# Patient Record
Sex: Male | Born: 1965
Health system: Southern US, Community
[De-identification: ages and names within clinical notes are randomized; demographics above are authoritative.]

## PROBLEM LIST (undated history)

## (undated) DIAGNOSIS — E785 Hyperlipidemia, unspecified: Secondary | ICD-10-CM

## (undated) DIAGNOSIS — I10 Essential (primary) hypertension: Secondary | ICD-10-CM

## (undated) DIAGNOSIS — M1990 Unspecified osteoarthritis, unspecified site: Secondary | ICD-10-CM

## (undated) DIAGNOSIS — N189 Chronic kidney disease, unspecified: Secondary | ICD-10-CM

## (undated) DIAGNOSIS — R51 Headache: Secondary | ICD-10-CM

## (undated) DIAGNOSIS — K649 Unspecified hemorrhoids: Secondary | ICD-10-CM

## (undated) DIAGNOSIS — K760 Fatty (change of) liver, not elsewhere classified: Secondary | ICD-10-CM

## (undated) DIAGNOSIS — K219 Gastro-esophageal reflux disease without esophagitis: Secondary | ICD-10-CM

## (undated) DIAGNOSIS — F410 Panic disorder [episodic paroxysmal anxiety] without agoraphobia: Secondary | ICD-10-CM

## (undated) DIAGNOSIS — G4730 Sleep apnea, unspecified: Secondary | ICD-10-CM

## (undated) DIAGNOSIS — T4145XA Adverse effect of unspecified anesthetic, initial encounter: Secondary | ICD-10-CM

## (undated) DIAGNOSIS — K4090 Unilateral inguinal hernia, without obstruction or gangrene, not specified as recurrent: Secondary | ICD-10-CM

## (undated) DIAGNOSIS — R519 Headache, unspecified: Secondary | ICD-10-CM

## (undated) DIAGNOSIS — T8859XA Other complications of anesthesia, initial encounter: Secondary | ICD-10-CM

## (undated) DIAGNOSIS — R945 Abnormal results of liver function studies: Secondary | ICD-10-CM

## (undated) DIAGNOSIS — F419 Anxiety disorder, unspecified: Secondary | ICD-10-CM

## (undated) DIAGNOSIS — F319 Bipolar disorder, unspecified: Secondary | ICD-10-CM

## (undated) DIAGNOSIS — R7989 Other specified abnormal findings of blood chemistry: Secondary | ICD-10-CM

## (undated) HISTORY — PX: HERNIA REPAIR: SHX51

## (undated) HISTORY — PX: VASECTOMY: SHX75

## (undated) HISTORY — PX: FRACTURE SURGERY: SHX138

## (undated) HISTORY — PX: LIPOMA EXCISION: SHX5283

## (undated) HISTORY — PX: COLONOSCOPY: SHX174

---

## 2004-03-01 ENCOUNTER — Ambulatory Visit: Payer: Self-pay

## 2009-04-15 ENCOUNTER — Ambulatory Visit: Payer: Self-pay | Admitting: Family Medicine

## 2013-01-05 ENCOUNTER — Inpatient Hospital Stay: Payer: Self-pay | Admitting: Internal Medicine

## 2013-01-05 LAB — BASIC METABOLIC PANEL
Anion Gap: 7 (ref 7–16)
BUN: 46 mg/dL — ABNORMAL HIGH (ref 7–18)
Chloride: 98 mmol/L (ref 98–107)
Co2: 29 mmol/L (ref 21–32)
Creatinine: 3.13 mg/dL — ABNORMAL HIGH (ref 0.60–1.30)
EGFR (African American): 26 — ABNORMAL LOW
Glucose: 115 mg/dL — ABNORMAL HIGH (ref 65–99)
Osmolality: 281 (ref 275–301)
Sodium: 134 mmol/L — ABNORMAL LOW (ref 136–145)

## 2013-01-05 LAB — CBC
HCT: 45.2 % (ref 40.0–52.0)
HGB: 16 g/dL (ref 13.0–18.0)
MCH: 31.7 pg (ref 26.0–34.0)
MCHC: 35.3 g/dL (ref 32.0–36.0)
MCV: 90 fL (ref 80–100)
Platelet: 351 10*3/uL (ref 150–440)
RDW: 13.8 % (ref 11.5–14.5)

## 2013-01-05 LAB — URINALYSIS, COMPLETE
Bilirubin,UR: NEGATIVE
Blood: NEGATIVE
Glucose,UR: 50 mg/dL (ref 0–75)
Granular Cast: 6
Leukocyte Esterase: NEGATIVE
Ph: 5 (ref 4.5–8.0)
Specific Gravity: 1.02 (ref 1.003–1.030)
Squamous Epithelial: <1
WBC UR: 4 /HPF (ref 0–5)

## 2013-01-06 LAB — CBC WITH DIFFERENTIAL/PLATELET
Basophil #: 0.1 10*3/uL (ref 0.0–0.1)
Eosinophil #: 0.3 10*3/uL (ref 0.0–0.7)
Eosinophil %: 4.4 %
HGB: 13.9 g/dL (ref 13.0–18.0)
Lymphocyte %: 33.4 %
MCH: 32.1 pg (ref 26.0–34.0)
MCV: 91 fL (ref 80–100)
Monocyte %: 11.6 %
Neutrophil #: 3.1 10*3/uL (ref 1.4–6.5)
Neutrophil %: 49.7 %
Platelet: 254 10*3/uL (ref 150–440)
RBC: 4.32 10*6/uL — ABNORMAL LOW (ref 4.40–5.90)
WBC: 6.3 10*3/uL (ref 3.8–10.6)

## 2013-01-06 LAB — BASIC METABOLIC PANEL
Anion Gap: 6 — ABNORMAL LOW (ref 7–16)
BUN: 31 mg/dL — ABNORMAL HIGH (ref 7–18)
Calcium, Total: 8.6 mg/dL (ref 8.5–10.1)
Chloride: 106 mmol/L (ref 98–107)
Co2: 27 mmol/L (ref 21–32)
EGFR (African American): 55 — ABNORMAL LOW
EGFR (Non-African Amer.): 47 — ABNORMAL LOW
Glucose: 109 mg/dL — ABNORMAL HIGH (ref 65–99)
Potassium: 3.5 mmol/L (ref 3.5–5.1)

## 2013-01-06 LAB — MAGNESIUM: Magnesium: 2.4 mg/dL

## 2013-01-06 LAB — LIPID PANEL
Cholesterol: 149 mg/dL (ref 0–200)
HDL Cholesterol: 27 mg/dL — ABNORMAL LOW (ref 40–60)
Triglycerides: 452 mg/dL — ABNORMAL HIGH (ref 0–200)

## 2013-01-06 LAB — HEMOGLOBIN A1C: Hemoglobin A1C: 6 % (ref 4.2–6.3)

## 2013-01-06 LAB — TSH: Thyroid Stimulating Horm: 0.89 u[IU]/mL

## 2013-01-07 LAB — BASIC METABOLIC PANEL
Anion Gap: 4 — ABNORMAL LOW (ref 7–16)
Calcium, Total: 8.5 mg/dL (ref 8.5–10.1)
Chloride: 107 mmol/L (ref 98–107)
Creatinine: 1.18 mg/dL (ref 0.60–1.30)
EGFR (African American): >60
Glucose: 92 mg/dL (ref 65–99)
Osmolality: 281 (ref 275–301)
Sodium: 140 mmol/L (ref 136–145)

## 2013-01-07 LAB — CK: CK, Total: 237 U/L — ABNORMAL HIGH (ref 35–232)

## 2013-10-11 ENCOUNTER — Ambulatory Visit: Payer: Self-pay | Admitting: Gastroenterology

## 2013-10-14 LAB — PATHOLOGY REPORT

## 2014-08-22 NOTE — Discharge Summary (Signed)
PATIENT NAME:  Nathan Chambers, Nathan Chambers MR#:  [id number] DATE OF BIRTH:  Feb 01, 1966  DATE OF ADMISSION:  01/05/2013 DATE OF DISCHARGE:  01/07/2013  PRIMARY CARE PHYSICIAN: Juluis Pitch, MD  FINAL DIAGNOSES: 1.  Acute renal failure, dehydration and relative hypotension on admission.  2.  Hypertension.  3.  Hyperlipidemia and hypertriglyceridemia.  4.  Obesity.  5.  Body aches.  Advised to stop Lotrel, stop meloxicam and hold Lipitor for body aches for 1 week.   DISCHARGE MEDICATIONS: 1.  Gemfibrozil 600 mg twice a day. 2.  Vitamin C 500 mg daily. 3.  Aspirin 81 mg daily. 4.  Folic acid 0.8 mg daily. 5.  Vitamin B12 500 mcg daily. 6.  Multivitamin daily. 7.  Ocuvite antioxidant multivitamin 1 tablet daily. 8.  Xanax 0.5 mg twice a day as needed for anxiety. 9.  Paxil 20 mg 2 tablets daily. 10.  Lutein 6 mg daily. 11.  Tramadol 50 mg every 6 hours as needed for pain. 12.  Amlodipine 10 mg daily.   DIET: Low sodium diet, regular consistency.   ACTIVITY: As tolerated.   DISCHARGE FOLLOWUP:  In 1 to 2 weeks with Dr. Lovie Macadamia.   REASON FOR ADMISSION: The patient was admitted 01/05/2013 and discharged 01/07/2013. Came in with dehydration and dizziness and was found to be in acute renal failure with dehydration with blood pressure being on the lower side. The patient was given vigorous IV fluid hydration. His Lotrel was held.   LABORATORY AND DIAGNOSTICS: During the hospital course included: Glucose 115, BUN 46, creatinine 3.13, sodium 134, potassium 3.6, chloride 98, CO2 29. White blood cell count 8.1, H and H 16.0 and 45.2, platelet count 351. Urinalysis positive for protein 100 mg/dL, glucose 50 mg/dL. Ultrasound of the kidneys showed mild increased renal echo texture. Can be consistent with renal failure. TSH 0.89, total cholesterol 149, triglycerides 452, HDL 27. LDL could not be calculated. Hemoglobin A1c was 6.0. Magnesium 2.4. Creatinine on the next day came down to 1.67. Creatinine upon  discharge 1.18 with a GFR greater than 60. CPK 237.   HOSPITAL COURSE PER PROBLEM LIST:  1.  For the patient's acute renal failure, dehydration and relative hypotension on admission, the patient was given vigorous IV fluid hydration. Creatinine improved down to normal range. The patient was discharged home in stable condition. I believe this was secondary to the patient's nausea and retching that was going on prior to admission. I did hold the patient's benazepril, part of the Lotrel, and did write him a script for Norvasc to go home with. I did advise to stop the meloxicam and decrease the tramadol dose.  2.  History of hypertension. Blood pressure was on the lower side during the hospitalization. Blood pressure started creeping up upon discharge. Can restart Norvasc, part of the Lotrel, upon discharge. Blood pressure 153/91 upon discharge.  3.  Hyperlipidemia and hypertriglyceridemia. The patient is having body aches. I advised him to stop the Lipitor for a week and see what happens. Can continue the gemfibrozil for right now. CPK normal range so this is not a rhabdomyolysis.  4.  Obesity, BMI 36.9. Weight loss recommended.  5.  Body aches. Could be secondary to the combination of statin and gemfibrozil. Continue to monitor as outpatient. Follow-up with Dr. Lovie Macadamia in 1 to 2 weeks.  TIME SPENT ON DISCHARGE: 35 minutes.  ____________________________ Tana Conch. Leslye Peer, MD rjw:sb D: 01/07/2013 15:40:06 ET T: 01/07/2013 16:12:58 ET JOB#: [id number]  cc: Tana Conch. Leslye Peer, MD, <  Dictator> Youlanda Roys. Lovie Macadamia, MD Marisue Brooklyn MD ELECTRONICALLY SIGNED 01/11/2013 16:14

## 2014-08-22 NOTE — H&P (Signed)
PATIENT NAME:  Nathan Chambers, Nathan Chambers MR#:  [id number] DATE OF BIRTH:  1965-11-02  DATE OF ADMISSION:  01/05/2013  PRIMARY CARE PHYSICIAN: Youlanda Roys. Lovie Macadamia, MD  REFERRING PHYSICIAN: Sheryl L. Benjaman Lobe, MD  CHIEF COMPLAINT: Dehydrated, dizziness and weakness for the past 4 days.   HISTORY OF PRESENT ILLNESS: A 49 year old Caucasian male with a history of hypertension, hyperlipidemia and anxiety, who presented to the ED with the above chief complaint. The patient is alert, awake, oriented, in no acute distress. The patient said he worked in a very hot building, he felt hot, sweating and dehydrated 4 days ago; however, he still had to work. The symptoms are getting worse. In addition, the patient feels weak, dizzy, exhausted, so he came to the ED for further evaluation. The patient was noted to have an elevated creatinine of more than 3. The patient is being admitted for acute renal failure. The patient has a headache, dizziness, weakness, but denies any chest pain, palpitations, orthopnea or nocturnal dyspnea. The patient denies any dysuria or hematuria, but has less urine recently. No diarrhea, but has nausea.   PAST MEDICAL HISTORY:  1. Hypertension.  2. Hyperlipidemia.  3. Anxiety.   PAST SURGICAL HISTORY: No.   FAMILY HISTORY: Father has diabetes.   ALLERGIES: None.  HOME MEDICATIONS: 1. Norvasc/benazepril 10 mg/20 mg p.o. daily.  2. Lipitor 20 mg p.o. daily.  3. Gemfibrozil 600 mg p.o. daily. 4.  Xanax 0.25 mg p.o. q.8 hours p.r.n.  5. Aspirin 81 mg p.o. daily. Full medication list has not been done yet. Please follow up with pharmacist for full list.   REVIEW OF SYSTEMS:  CONSTITUTIONAL: The patient denies any fever or chills, but has headache, dizziness, weakness. EYES: No double vision or blurry vision.  ENT: No postnasal drip, slurred speech or dysphagia.  CARDIOVASCULAR: No chest pain, palpitation, orthopnea or nocturnal dyspnea. No leg edema.  GASTROINTESTINAL: Positive for  nausea, but no abdominal pain, vomiting or diarrhea. No melena or bloody stool.  PULMONARY: No cough, sputum, shortness of breath or hemoptysis.  SKIN: No rash or jaundice.  ENDOCRINOLOGY: No polyuria, polydipsia, heat or cold intolerance.  NEUROLOGY: No syncope, loss of consciousness or seizure.  HEMATOLOGY: No easy bruising or bleeding.   PHYSICAL EXAMINATION: VITAL SIGNS: Temperature 98.1, blood pressure 133/61, pulse 61, respirations 18, O2 saturation 98% on room air.  GENERAL: The patient is alert, awake, oriented, in no acute distress.  HEENT: Pupils round and equally reactive to light and accommodation. Dry oral mucosa. Clear oropharynx.  NECK: Supple. No JVD or carotid bruits. No lymphadenopathy. No thyromegaly.  CARDIOVASCULAR: S1, S2, regular rate and rhythm. No murmurs or gallops.  PULMONARY: Bilateral air entry. No wheezing or rales. No use of accessory muscles to breathe.  ABDOMEN: Soft. No distention or tenderness. No organomegaly. Bowel sounds present.  EXTREMITIES: No edema, clubbing or cyanosis. No calf tenderness. Strong bilateral pedal pulses.  SKIN: No rash or jaundice.  NEUROLOGY: A and O x3. No focal deficit.   LABORATORY DATA: Urinalysis is negative. CBC in normal range. Glucose 115, BUN 46, creatinine 3.13, sodium 134, potassium 3.6, bicarbonate 29. Anion gap 7. EKG showed normal sinus rhythm at 61 BPM.   IMPRESSIONS:  1. Acute renal failure with dehydration.  2. Hypertension, controlled. Blood pressure is on the low side.  3. Hyperlipidemia.  4. Obesity.   PLAN OF TREATMENT:  1. The patient will be admitted to a medical floor. Will give aggressive IV fluid support with normal saline 150 mL  per hour. Follow up with BMP and kidney ultrasound.  2. Since the patient's blood pressure in on low side, will hold Norvasc and benazepril.  3. Continue Lipitor, but hold gemfibrozil.  4. Gastrointestinal and deep vein thrombosis prophylaxis.   I discussed the patient's  condition and the plan of treatment with the patient and the patient's wife.   TIME SPENT: About 52 minutes.   CODE STATUS: The patient is a full code.   ____________________________ Demetrios Loll, MD qc:OSi D: 01/05/2013 13:42:19 ET T: 01/05/2013 14:03:04 ET JOB#: [id number]  cc: Demetrios Loll, MD, <Dictator> Demetrios Loll MD ELECTRONICALLY SIGNED 01/05/2013 14:43

## 2015-01-26 DIAGNOSIS — F419 Anxiety disorder, unspecified: Secondary | ICD-10-CM | POA: Insufficient documentation

## 2015-01-26 DIAGNOSIS — I10 Essential (primary) hypertension: Secondary | ICD-10-CM | POA: Insufficient documentation

## 2015-01-26 DIAGNOSIS — E785 Hyperlipidemia, unspecified: Secondary | ICD-10-CM | POA: Insufficient documentation

## 2015-05-03 HISTORY — PX: JOINT REPLACEMENT: SHX530

## 2015-10-27 ENCOUNTER — Encounter: Payer: Self-pay | Admitting: *Deleted

## 2015-10-27 ENCOUNTER — Other Ambulatory Visit: Payer: Self-pay

## 2015-10-27 NOTE — Patient Instructions (Signed)
  Your procedure is scheduled on: 10-30-15 (FRIDAY) Report to Same Day Surgery 2nd floor medical mall To find out your arrival time please call [phone] between 1PM - 3PM on 10-29-15 [facility])  Remember: Instructions that are not followed completely may result in serious medical risk, up to and including death, or upon the discretion of your surgeon and anesthesiologist your surgery may need to be rescheduled.    _x___ 1. Do not eat food or drink liquids after midnight. No gum chewing or hard candies.     __x__ 2. No Alcohol for 24 hours before or after surgery.   __x__3. No Smoking for 24 prior to surgery.   ____  4. Bring all medications with you on the day of surgery if instructed.    __x__ 5. Notify your doctor if there is any change in your medical condition     (cold, fever, infections).     Do not wear jewelry, make-up, hairpins, clips or nail polish.  Do not wear lotions, powders, or perfumes. You may wear deodorant.  Do not shave 48 hours prior to surgery. Men may shave face and neck.  Do not bring valuables to the hospital.    [facility] is not responsible for any belongings or valuables.               Contacts, dentures or bridgework may not be worn into surgery.  Leave your suitcase in the car. After surgery it may be brought to your room.  For patients admitted to the hospital, discharge time is determined by your treatment team.   Patients discharged the day of surgery will not be allowed to drive home.    Please read over the following fact sheets that you were given:   [facility] Preparing for Surgery and or MRSA Information   _x___ Take these medicines the morning of surgery with A SIP OF WATER:    1. XANAX (ALPRAZOLAM)  2. LOPID (GEMFIBROZIL)  3. LOTREL (AMLODIPINE-BENAZEPRIL)  4. PAXIL (PAROXETINE)  5. PRAVACHOL (PRAVASTATIN)  6.  ____ Fleet Enema (as directed)   _x___ Use CHG Soap or sage wipes as directed on instruction sheet   ____ Use  inhalers on the day of surgery and bring to hospital day of surgery  ____ Stop metformin 2 days prior to surgery    ____ Take 1/2 of usual insulin dose the night before surgery and none on the morning of surgery.   _X___ Stop aspirin or coumadin, or plavix-STOP ASPIRIN NOW  _x__ Stop Anti-inflammatories such as Advil, Aleve, Ibuprofen, Motrin, Naproxen,          Naprosyn, Goodies powders or aspirin products. Ok to take Tylenol.   _X___ Stop supplements until after surgery-STOP VITAMIN C NOW   ____ Bring C-Pap to the hospital.

## 2015-10-28 ENCOUNTER — Encounter
Admission: RE | Admit: 2015-10-28 | Discharge: 2015-10-28 | Disposition: A | Discharge status: Home / Self Care | Payer: 59 | Source: Ambulatory Visit | Attending: Podiatry | Admitting: Podiatry

## 2015-10-28 DIAGNOSIS — Z7982 Long term (current) use of aspirin: Secondary | ICD-10-CM | POA: Diagnosis not present

## 2015-10-28 DIAGNOSIS — Z888 Allergy status to other drugs, medicaments and biological substances status: Secondary | ICD-10-CM | POA: Diagnosis not present

## 2015-10-28 DIAGNOSIS — Z8249 Family history of ischemic heart disease and other diseases of the circulatory system: Secondary | ICD-10-CM | POA: Diagnosis not present

## 2015-10-28 DIAGNOSIS — I10 Essential (primary) hypertension: Secondary | ICD-10-CM | POA: Diagnosis not present

## 2015-10-28 DIAGNOSIS — F419 Anxiety disorder, unspecified: Secondary | ICD-10-CM | POA: Diagnosis not present

## 2015-10-28 DIAGNOSIS — Z79899 Other long term (current) drug therapy: Secondary | ICD-10-CM | POA: Diagnosis not present

## 2015-10-28 DIAGNOSIS — E785 Hyperlipidemia, unspecified: Secondary | ICD-10-CM | POA: Diagnosis not present

## 2015-10-28 DIAGNOSIS — M1711 Unilateral primary osteoarthritis, right knee: Secondary | ICD-10-CM | POA: Diagnosis not present

## 2015-10-28 DIAGNOSIS — Z87891 Personal history of nicotine dependence: Secondary | ICD-10-CM | POA: Diagnosis not present

## 2015-10-28 DIAGNOSIS — Z8262 Family history of osteoporosis: Secondary | ICD-10-CM | POA: Diagnosis not present

## 2015-10-28 DIAGNOSIS — D1631 Benign neoplasm of short bones of right lower limb: Secondary | ICD-10-CM | POA: Diagnosis not present

## 2015-10-28 DIAGNOSIS — M7661 Achilles tendinitis, right leg: Secondary | ICD-10-CM | POA: Diagnosis not present

## 2015-10-28 DIAGNOSIS — Z833 Family history of diabetes mellitus: Secondary | ICD-10-CM | POA: Diagnosis not present

## 2015-10-30 ENCOUNTER — Ambulatory Visit
Admission: RE | Admit: 2015-10-30 | Discharge: 2015-10-30 | Disposition: A | Discharge status: Home / Self Care | Payer: 59 | Source: Ambulatory Visit | Attending: Podiatry | Admitting: Podiatry

## 2015-10-30 ENCOUNTER — Ambulatory Visit: Payer: 59 | Admitting: Anesthesiology

## 2015-10-30 ENCOUNTER — Encounter
Admission: RE | Disposition: A | Discharge status: Home / Self Care | Payer: Self-pay | Source: Ambulatory Visit | Attending: Podiatry

## 2015-10-30 ENCOUNTER — Encounter: Payer: Self-pay | Admitting: *Deleted

## 2015-10-30 DIAGNOSIS — M7661 Achilles tendinitis, right leg: Secondary | ICD-10-CM | POA: Insufficient documentation

## 2015-10-30 DIAGNOSIS — Z833 Family history of diabetes mellitus: Secondary | ICD-10-CM | POA: Insufficient documentation

## 2015-10-30 DIAGNOSIS — Z8262 Family history of osteoporosis: Secondary | ICD-10-CM | POA: Insufficient documentation

## 2015-10-30 DIAGNOSIS — Z87891 Personal history of nicotine dependence: Secondary | ICD-10-CM | POA: Insufficient documentation

## 2015-10-30 DIAGNOSIS — E785 Hyperlipidemia, unspecified: Secondary | ICD-10-CM | POA: Insufficient documentation

## 2015-10-30 DIAGNOSIS — M1711 Unilateral primary osteoarthritis, right knee: Secondary | ICD-10-CM | POA: Insufficient documentation

## 2015-10-30 DIAGNOSIS — D1631 Benign neoplasm of short bones of right lower limb: Secondary | ICD-10-CM | POA: Insufficient documentation

## 2015-10-30 DIAGNOSIS — Z888 Allergy status to other drugs, medicaments and biological substances status: Secondary | ICD-10-CM | POA: Insufficient documentation

## 2015-10-30 DIAGNOSIS — I10 Essential (primary) hypertension: Secondary | ICD-10-CM | POA: Insufficient documentation

## 2015-10-30 DIAGNOSIS — Z79899 Other long term (current) drug therapy: Secondary | ICD-10-CM | POA: Insufficient documentation

## 2015-10-30 DIAGNOSIS — Z8249 Family history of ischemic heart disease and other diseases of the circulatory system: Secondary | ICD-10-CM | POA: Insufficient documentation

## 2015-10-30 DIAGNOSIS — F419 Anxiety disorder, unspecified: Secondary | ICD-10-CM | POA: Insufficient documentation

## 2015-10-30 DIAGNOSIS — Z7982 Long term (current) use of aspirin: Secondary | ICD-10-CM | POA: Insufficient documentation

## 2015-10-30 HISTORY — DX: Unspecified osteoarthritis, unspecified site: M19.90

## 2015-10-30 HISTORY — DX: Headache, unspecified: R51.9

## 2015-10-30 HISTORY — DX: Essential (primary) hypertension: I10

## 2015-10-30 HISTORY — DX: Anxiety disorder, unspecified: F41.9

## 2015-10-30 HISTORY — DX: Fatty (change of) liver, not elsewhere classified: K76.0

## 2015-10-30 HISTORY — DX: Hyperlipidemia, unspecified: E78.5

## 2015-10-30 HISTORY — DX: Unspecified hemorrhoids: K64.9

## 2015-10-30 HISTORY — DX: Headache: R51

## 2015-10-30 HISTORY — PX: ACHILLES TENDON SURGERY: SHX542

## 2015-10-30 SURGERY — REPAIR, TENDON, ACHILLES
Anesthesia: General | Laterality: Right | Wound class: Clean

## 2015-10-30 MED ORDER — BUPIVACAINE HCL 0.25 % IJ SOLN
INTRAMUSCULAR | Status: DC | PRN
  Administered 2015-10-30: 17 mL

## 2015-10-30 MED ORDER — PROPOFOL 10 MG/ML IV BOLUS
INTRAVENOUS | Status: DC | PRN
  Administered 2015-10-30: 200 mg via INTRAVENOUS

## 2015-10-30 MED ORDER — ROCURONIUM BROMIDE 100 MG/10ML IV SOLN
INTRAVENOUS | Status: DC | PRN
  Administered 2015-10-30: 40 mg via INTRAVENOUS
  Administered 2015-10-30 (×2): 10 mg via INTRAVENOUS

## 2015-10-30 MED ORDER — ONDANSETRON HCL 4 MG PO TABS: 4.0000 mg | ORAL_TABLET | Freq: Four times a day (QID) | ORAL | Status: DC | PRN

## 2015-10-30 MED ORDER — ROPIVACAINE HCL 5 MG/ML IJ SOLN
INTRAMUSCULAR | Status: AC
  Filled 2015-10-30: qty 40

## 2015-10-30 MED ORDER — GLYCOPYRROLATE 0.2 MG/ML IJ SOLN
INTRAMUSCULAR | Status: DC | PRN
  Administered 2015-10-30: 0.2 mg via INTRAVENOUS

## 2015-10-30 MED ORDER — FAMOTIDINE 20 MG PO TABS: 20.0000 mg | ORAL_TABLET | Freq: Once | ORAL | Status: DC

## 2015-10-30 MED ORDER — FENTANYL CITRATE (PF) 100 MCG/2ML IJ SOLN: 25.0000 ug | INTRAMUSCULAR | Status: DC | PRN

## 2015-10-30 MED ORDER — FENTANYL CITRATE (PF) 100 MCG/2ML IJ SOLN
50.0000 ug | Freq: Once | INTRAMUSCULAR | Status: AC
  Administered 2015-10-30: 50 ug via INTRAVENOUS

## 2015-10-30 MED ORDER — OXYCODONE-ACETAMINOPHEN 5-325 MG PO TABS: 1.0000 | ORAL_TABLET | ORAL | Status: DC | PRN

## 2015-10-30 MED ORDER — BUPIVACAINE HCL (PF) 0.25 % IJ SOLN
INTRAMUSCULAR | Status: AC
  Filled 2015-10-30: qty 30

## 2015-10-30 MED ORDER — LIDOCAINE HCL (PF) 1 % IJ SOLN
INTRAMUSCULAR | Status: AC
Start: 2015-10-30 — End: 2015-10-30
  Administered 2015-10-30: 1 mL
  Filled 2015-10-30: qty 5

## 2015-10-30 MED ORDER — SUCCINYLCHOLINE CHLORIDE 20 MG/ML IJ SOLN
INTRAMUSCULAR | Status: DC | PRN
  Administered 2015-10-30: 120 mg via INTRAVENOUS

## 2015-10-30 MED ORDER — FENTANYL CITRATE (PF) 100 MCG/2ML IJ SOLN
INTRAMUSCULAR | Status: AC
  Administered 2015-10-30: 50 ug via INTRAVENOUS
  Filled 2015-10-30: qty 2

## 2015-10-30 MED ORDER — PHENYLEPHRINE HCL 10 MG/ML IJ SOLN
INTRAMUSCULAR | Status: DC | PRN
  Administered 2015-10-30 (×2): 100 ug via INTRAVENOUS

## 2015-10-30 MED ORDER — MIDAZOLAM HCL 5 MG/5ML IJ SOLN
INTRAMUSCULAR | Status: AC
Start: 2015-10-30 — End: 2015-10-30
  Administered 2015-10-30: 2 mg via INTRAVENOUS
  Filled 2015-10-30: qty 5

## 2015-10-30 MED ORDER — EPINEPHRINE HCL 1 MG/ML IJ SOLN
INTRAMUSCULAR | Status: AC
  Filled 2015-10-30: qty 1

## 2015-10-30 MED ORDER — ROPIVACAINE HCL 5 MG/ML IJ SOLN
INTRAMUSCULAR | Status: AC
Start: 2015-10-30 — End: 2015-10-30
  Administered 2015-10-30: 30 mL via EPIDURAL
  Filled 2015-10-30: qty 40

## 2015-10-30 MED ORDER — LIDOCAINE HCL (CARDIAC) 20 MG/ML IV SOLN
INTRAVENOUS | Status: DC | PRN
  Administered 2015-10-30: 100 mg via INTRAVENOUS

## 2015-10-30 MED ORDER — EPHEDRINE SULFATE 50 MG/ML IJ SOLN
INTRAMUSCULAR | Status: DC | PRN
  Administered 2015-10-30 (×3): 10 mg via INTRAVENOUS
  Administered 2015-10-30: 5 mg via INTRAVENOUS

## 2015-10-30 MED ORDER — BUPIVACAINE HCL (PF) 0.5 % IJ SOLN
INTRAMUSCULAR | Status: AC
  Filled 2015-10-30: qty 30

## 2015-10-30 MED ORDER — CEFAZOLIN SODIUM-DEXTROSE 2-4 GM/100ML-% IV SOLN
INTRAVENOUS | Status: AC
  Filled 2015-10-30: qty 100

## 2015-10-30 MED ORDER — ONDANSETRON HCL 4 MG/2ML IJ SOLN
INTRAMUSCULAR | Status: DC | PRN
  Administered 2015-10-30 (×2): 4 mg via INTRAVENOUS

## 2015-10-30 MED ORDER — LIDOCAINE-EPINEPHRINE 1 %-1:100000 IJ SOLN
INTRAMUSCULAR | Status: AC
  Filled 2015-10-30: qty 1

## 2015-10-30 MED ORDER — ONDANSETRON HCL 4 MG/2ML IJ SOLN: 4.0000 mg | Freq: Once | INTRAMUSCULAR | Status: DC | PRN

## 2015-10-30 MED ORDER — LACTATED RINGERS IV SOLN
INTRAVENOUS | Status: DC
  Administered 2015-10-30 (×3): via INTRAVENOUS

## 2015-10-30 MED ORDER — FENTANYL CITRATE (PF) 100 MCG/2ML IJ SOLN
INTRAMUSCULAR | Status: DC | PRN
  Administered 2015-10-30: 150 ug via INTRAVENOUS
  Administered 2015-10-30: 100 ug via INTRAVENOUS

## 2015-10-30 MED ORDER — LIDOCAINE HCL (PF) 1 % IJ SOLN
INTRAMUSCULAR | Status: AC
  Filled 2015-10-30: qty 30

## 2015-10-30 MED ORDER — DEXAMETHASONE SODIUM PHOSPHATE 10 MG/ML IJ SOLN
INTRAMUSCULAR | Status: DC | PRN
  Administered 2015-10-30: 10 mg via INTRAVENOUS

## 2015-10-30 MED ORDER — ONDANSETRON HCL 4 MG/2ML IJ SOLN: 4.0000 mg | Freq: Four times a day (QID) | INTRAMUSCULAR | Status: DC | PRN

## 2015-10-30 MED ORDER — CEFAZOLIN SODIUM-DEXTROSE 2-4 GM/100ML-% IV SOLN
2.0000 g | Freq: Once | INTRAVENOUS | Status: AC
  Administered 2015-10-30: 2 g via INTRAVENOUS

## 2015-10-30 MED ORDER — FAMOTIDINE 20 MG PO TABS
ORAL_TABLET | ORAL | Status: AC
  Filled 2015-10-30: qty 1

## 2015-10-30 MED ORDER — OXYCODONE-ACETAMINOPHEN 7.5-325 MG PO TABS: ORAL_TABLET | ORAL | Status: DC

## 2015-10-30 MED ORDER — MIDAZOLAM HCL 5 MG/5ML IJ SOLN
2.0000 mg | Freq: Once | INTRAMUSCULAR | Status: AC
  Administered 2015-10-30: 2 mg via INTRAVENOUS

## 2015-10-30 SURGICAL SUPPLY — 58 items
ANCHOR SUT 5.5 SPEEDSCREW (Screw) ×2 IMPLANT
ANCHOR SUT KNTLS SPEEDLOCK (Anchor) IMPLANT
BAG COUNTER SPONGE EZ (MISCELLANEOUS) IMPLANT
BANDAGE ELASTIC 4 LF NS (GAUZE/BANDAGES/DRESSINGS) ×4 IMPLANT
BANDAGE STRETCH 3X4.1 STRL (GAUZE/BANDAGES/DRESSINGS) ×2 IMPLANT
BLADE SURG 15 STRL LF DISP TIS (BLADE) ×2 IMPLANT
BLADE SURG 15 STRL SS (BLADE) ×2
BLADE SURG MINI STRL (BLADE) ×2 IMPLANT
BNDG ESMARK 4X12 TAN STRL LF (GAUZE/BANDAGES/DRESSINGS) ×2 IMPLANT
BNDG ESMARK 6X12 TAN STRL LF (GAUZE/BANDAGES/DRESSINGS) ×2 IMPLANT
CANISTER SUCT 1200ML W/VALVE (MISCELLANEOUS) ×2 IMPLANT
CHLORAPREP W/TINT 26ML (MISCELLANEOUS) ×2 IMPLANT
CUFF TOURN 30 STER DUAL PORT (MISCELLANEOUS) ×2 IMPLANT
DRAPE FLUOR MINI C-ARM 54X84 (DRAPES) ×2 IMPLANT
DURAPREP 26ML APPLICATOR (WOUND CARE) IMPLANT
ELECT REM PT RETURN 9FT ADLT (ELECTROSURGICAL) ×2
ELECTRODE REM PT RTRN 9FT ADLT (ELECTROSURGICAL) ×1 IMPLANT
GAUZE PETRO XEROFOAM 1X8 (MISCELLANEOUS) ×2 IMPLANT
GAUZE SPONGE 4X4 12PLY STRL (GAUZE/BANDAGES/DRESSINGS) ×2 IMPLANT
GAUZE STRETCH 2X75IN STRL (MISCELLANEOUS) ×2 IMPLANT
GLOVE BIO SURGEON STRL SZ7.5 (GLOVE) ×2 IMPLANT
GLOVE INDICATOR 8.0 STRL GRN (GLOVE) ×2 IMPLANT
GOWN STRL REUS W/ TWL LRG LVL3 (GOWN DISPOSABLE) ×2 IMPLANT
GOWN STRL REUS W/TWL LRG LVL3 (GOWN DISPOSABLE) ×2
HANDLE YANKAUER SUCT BULB TIP (MISCELLANEOUS) ×2 IMPLANT
LABEL OR SOLS (LABEL) IMPLANT
NDL MAYO CATGUT SZ5 (NEEDLE) ×1
NDL SUT 5 .5 CRC TPR PNT MAYO (NEEDLE) ×1 IMPLANT
NEEDLE FILTER BLUNT 18X 1/2SAF (NEEDLE) ×1
NEEDLE FILTER BLUNT 18X1 1/2 (NEEDLE) ×1 IMPLANT
NEEDLE HYPO 25X1 1.5 SAFETY (NEEDLE) ×6 IMPLANT
NS IRRIG 500ML POUR BTL (IV SOLUTION) ×2 IMPLANT
PACK EXTREMITY ARMC (MISCELLANEOUS) ×2 IMPLANT
PAD CAST CTTN 4X4 STRL (SOFTGOODS) ×1 IMPLANT
PADDING CAST COTTON 4X4 STRL (SOFTGOODS) ×1
RASP SM TEAR CROSS CUT (RASP) ×2 IMPLANT
SPLINT CAST 1 STEP 5X30 WHT (MISCELLANEOUS) ×2 IMPLANT
SPLINT FAST PLASTER 5X30 (CAST SUPPLIES) ×1
SPLINT PLASTER CAST FAST 5X30 (CAST SUPPLIES) ×1 IMPLANT
SPONGE LAP 18X18 5 PK (GAUZE/BANDAGES/DRESSINGS) ×2 IMPLANT
STOCKINETTE M/LG 89821 (MISCELLANEOUS) ×2 IMPLANT
STRAP SAFETY BODY (MISCELLANEOUS) ×2 IMPLANT
STRIP CLOSURE SKIN 1/4X4 (GAUZE/BANDAGES/DRESSINGS) ×2 IMPLANT
SUT MNCRL AB 4-0 PS2 18 (SUTURE) ×2 IMPLANT
SUT MNCRL+ 5-0 VIOLET P-3 (SUTURE) ×1 IMPLANT
SUT MONOCRYL 5-0 (SUTURE) ×1
SUT PDS AB 0 CT1 27 (SUTURE) ×2 IMPLANT
SUT VIC AB 0 SH 27 (SUTURE) ×2 IMPLANT
SUT VIC AB 2-0 SH 27 (SUTURE) ×1
SUT VIC AB 2-0 SH 27XBRD (SUTURE) ×1 IMPLANT
SUT VIC AB 3-0 SH 27 (SUTURE) ×1
SUT VIC AB 3-0 SH 27X BRD (SUTURE) ×1 IMPLANT
SUT VIC AB 4-0 FS2 27 (SUTURE) ×2 IMPLANT
SUT VICRYL AB 3-0 FS1 BRD 27IN (SUTURE) ×2 IMPLANT
SYR 3ML LL SCALE MARK (SYRINGE) ×2 IMPLANT
SYR BULB EAR ULCER 3OZ GRN STR (SYRINGE) ×2 IMPLANT
SYRINGE 10CC LL (SYRINGE) ×4 IMPLANT
WIRE MAGNUM (SUTURE) ×2 IMPLANT

## 2015-10-30 NOTE — Anesthesia Postprocedure Evaluation (Signed)
Anesthesia Post Note  Patient: Nathan Chambers  Procedure(s) Performed: Procedure(s) (LRB): ACHILLES TENDON REPAIR/ HEGLAND OSTECTOMY (Right)  Patient location during evaluation: PACU Anesthesia Type: General Level of consciousness: awake and alert Pain management: pain level controlled Vital Signs Assessment: post-procedure vital signs reviewed and stable Respiratory status: spontaneous breathing and respiratory function stable Cardiovascular status: stable Anesthetic complications: no    Last Vitals:  Filed Vitals:   10/30/15 1117 10/30/15 1144  BP:  114/64  Pulse: 69 66  Temp:  36.5 C  Resp: 17 18    Last Pain:  Filed Vitals:   10/30/15 1148  PainSc: 0-No pain                 Zonia Caplin K

## 2015-10-30 NOTE — Anesthesia Procedure Notes (Addendum)
Anesthesia Regional Block:  Popliteal block  Pre-Anesthetic Checklist: ,, timeout performed, Correct Patient, Correct Site, Correct Laterality, Correct Procedure, Correct Position, site marked, Risks and benefits discussed,  Surgical consent,  Pre-op evaluation,  At surgeon's request and post-op pain management  Laterality: Right  Prep: chloraprep       Needles:  Injection technique: Single-shot  Needle Type: Echogenic Stimulator Needle     Needle Length: 9cm 9 cm Needle Gauge: 21 and 21 G    Additional Needles:  Procedures: nerve stimulator Popliteal block  Nerve Stimulator or Paresthesia:  Response: Gastroc, 0.8 mA, 7 cm  Additional Responses:   Narrative:  Start time: 10/30/2015 7:18 AM End time: 10/30/2015 7:35 AM Injection made incrementally with aspirations every 5 mL.  Performed by: Personally  Anesthesiologist: Katy Fitch K  Additional Notes: Functioning IV was confirmed and monitors were applied.  A echogenic needle was used. Sterile prep,hand hygiene and sterile gloves were used.  Negative aspiration and negative test dose prior to incremental administration of local anesthetic. The patient tolerated the procedure well with no immediate complications.   Procedure Name: Intubation Date/Time: 10/30/2015 8:00 AM Performed by: Nelda Marseille Pre-anesthesia Checklist: Patient identified, Patient being monitored, Timeout performed, Emergency Drugs available and Suction available Patient Re-evaluated:Patient Re-evaluated prior to inductionOxygen Delivery Method: Circle system utilized Preoxygenation: Pre-oxygenation with 100% oxygen Intubation Type: IV induction Ventilation: Mask ventilation without difficulty Laryngoscope Size: 3 and McGraph Grade View: Grade IV Tube type: Oral Tube size: 7.5 mm Number of attempts: 1 Airway Equipment and Method: Stylet Placement Confirmation: ETT inserted through vocal cords under direct vision,  positive ETCO2 and breath  sounds checked- equal and bilateral Secured at: 21 cm Tube secured with: Tape Dental Injury: Teeth and Oropharynx as per pre-operative assessment

## 2015-10-30 NOTE — Discharge Instructions (Signed)
These instructions will give you an idea of what to expect after surgery and how to manage issues that may arise before your first post op office visit.  Pain Management Pain is best managed by staying ahead of it. If pain gets out of control, it is difficult to get it back under control. Local anesthesia that lasts 6-8 hours is used to numb the foot and decrease pain.  For the best pain control, take the pain medication every 4 hours for the first 2 days post op. On the third day pain medication can be taken as needed.   Post Op Nausea Nausea is common after surgery, so it is managed proactively.  If prescribed, use the prescribed nausea medication regularly for the first 2 days post op.  Bandages Do not worry if there is blood on the bandage. What looks like a lot of blood on the bandage is actually a small amount. Blood on the dressing spreads out as it is absorbed by the gauze, the same way a drop of water spreads out on a paper towel.  If the bandages feel wet or dry, stiff and uncomfortable, call the office during office hours and we will schedule a time for you to have the bandage changed.  Unless you are specifically told otherwise, we will do the first bandage change in the office.  Keep your bandage dry. If the bandage becomes wet or soiled, notify the office and we will schedule a time to change the bandage.  Activity It is best to spend most of the first 2 days after surgery lying down with the foot elevated above the level of your heart. Remain completely NWB to the operative foot  You may only get up to go to the restroom.  Driving Do not drive until you are able to respond in an emergency (i.e. slam on the brakes). This usually occurs after the bone has healed - 6 to 8 weeks.  Call the Office If you have a fever over 101F.  If you have increasing pain after the initial post op pain has settled down.  If you have increasing redness, swelling, or drainage.  If you have any  questions or concerns.   POST OPERATIVE INSTRUCTIONS FOR DR. [place]   Take your medication as prescribed.  Pain medication should be taken only as needed.  Keep the dressing clean, dry and intact.  Keep your foot elevated above the heart level for the first 48 hours.  Walking to the bathroom and brief periods of walking are acceptable, unless we have instructed you to be non-weight bearing.  Always wear your post-op shoe when walking.  Always use your crutches if you are to be non-weight bearing.  Do not take a shower. Baths are permissible as long as the foot is kept out of the water.   Every hour you are awake:  Bend your knee 15 times. Massage calf 15 times Call [facility] [phone]) if any of the following problems occur: You develop a temperature or fever. The bandage becomes saturated with blood. Medication does not stop your pain. Injury of the foot occurs. Any symptoms of infection including redness, odor, or red streaks running from wound.

## 2015-10-30 NOTE — Transfer of Care (Signed)
Immediate Anesthesia Transfer of Care Note  Patient: Nathan Chambers  Procedure(s) Performed: Procedure(s): ACHILLES TENDON REPAIR/ HEGLAND OSTECTOMY (Right)  Patient Location: PACU  Anesthesia Type:General  Level of Consciousness: sedated  Airway & Oxygen Therapy: Patient Spontanous Breathing and Patient connected to face mask oxygen  Post-op Assessment: Report given to RN and Post -op Vital signs reviewed and stable  Post vital signs: Reviewed and stable  Last Vitals:  Filed Vitals:   10/30/15 0743 10/30/15 0748  BP: 130/74 136/73  Pulse: 56 61  Temp:    Resp: 14 19    Last Pain:  Filed Vitals:   10/30/15 0755  PainSc: 3          Complications: No apparent anesthesia complications

## 2015-10-30 NOTE — Op Note (Signed)
Operative note   Surgeon:Annely Sliva    Assistant:none     Preop diagnosis:1.  Achilles tendinosis right  2. Calcaneal exostosis right    Postop diagnosis:Same    Procedure:1. Achilles tendon repair right 2. Calcaneal exostectomy right calcaneus    CH:[id number]    Anesthesia:regional and general    Hemostasis: Thigh tourniquet inflated to 325 mmHg for approximately 90 minutes    Specimen: Calcaneal exostectomy and tendinosis    Complications: None    Operative indications:Dayvian R Bumpus is an 50 y.o. that presents today for surgical intervention.  The risks/benefits/alternatives/complications have been discussed and consent has been given.    Procedure:  Patient was brought into the OR and placed on the operating table in theprone position. After anesthesia was obtained theright lower extremity was prepped and draped in usual sterile fashion.  Attention was directed to the posterior aspect of the Achilles at its insertion where a longitudinal incision was made. Sharp and blunt dissection carried down to the peritenon. The peritenon was then incised. And the longitudinal incision of the Achilles was made at the superior aspect of the calcaneus into the plantar aspect of the calcaneus. Medial and lateral dissection of the Achilles from the posterior calcaneus was sharply performed. At this time a very large posterior calcaneus exostosis was noted. With use of an osteotome this was then removed and sent for pathological examination. Further remodeling of the posterior calcaneus was then undertaken with the use of a rongeur, osteotome, and power rasp. At this time the wound was then flushed with copious amounts of irrigation. There was noted be marketed thickening of the Achilles tendon at its insertional site. I was able to debride this with a 15 blade and remove a large amount of the bulky Achilles tendon insertional region. This was also sent for pathological examination. The longitudinal  splitting incision of the Achilles was then repaired with a 0 PDS. Initially the tendon was repaired with a 2-0 Vicryl. Next a 5.5 speed screw was then placed into the posterior aspect of the calcaneus at the Achilles insertional site. FiberWire was sutured through the distal portion of the Achilles repair site and using standard technique with the speed screw this was placed into the calcaneus. Good alignment was noted with good bone to tendon contacted this time. Good excursion of the tendon was noted with squeezing of the calf. This was quite stable. Further repair of the distal portion of the Achilles was then performed with a 0 PDS. At this time the peritenon was closed with a 4-0 Vicryl as well as the subcutaneous tissue and the skin closed with a 5-0 Monocryl. 0.25% Marcaine was placed around the incision sites. Then placed in a well compressive bulky sterile dressing with posterior splint.    Patient tolerated the procedure and anesthesia well.  Was transported from the OR to the PACU with all vital signs stable and vascular status intact. To be discharged per routine protocol.  Will follow up in approximately 1 week in the outpatient clinic.

## 2015-10-30 NOTE — H&P (Signed)
  HISTORY AND PHYSICAL INTERVAL NOTE:  10/30/2015  7:21 AM  Nathan Chambers  has presented today for surgery, with the diagnosis of ACHILLES TENDINITIS,POSTERIOR CALCANEAL.  The various methods of treatment have been discussed with the patient.  No guarantees were given.  After consideration of risks, benefits and other options for treatment, the patient has consented to surgery.  I have reviewed the patients' chart and labs.    Patient Vitals for the past 24 hrs:  BP Temp Temp src Pulse Resp SpO2 Weight  10/30/15 0713 129/79 mmHg - - (!) 57 16 96 % -  10/30/15 0620 - - - - - - (!) 141.522 kg (312 lb)  10/30/15 0612 138/73 mmHg 98 F (36.7 C) Oral 61 16 99 % -    Chambers history and physical examination was performed in my office.  The patient was reexamined.  There have been no changes to this history and physical examination.Plan for right calcaneal exostectomy and achilles tendon repair.  Nathan Chambers

## 2015-10-30 NOTE — Anesthesia Preprocedure Evaluation (Signed)
Anesthesia Evaluation  Patient identified by MRN, date of birth, ID band Patient awake    Reviewed: Allergy & Precautions, NPO status , Patient's Chart, lab work & pertinent test results  History of Anesthesia Complications Negative for: history of anesthetic complications  Airway Mallampati: III       Dental   Pulmonary neg pulmonary ROS,           Cardiovascular hypertension, Pt. on medications      Neuro/Psych Anxiety negative neurological ROS     GI/Hepatic negative GI ROS, Neg liver ROS,   Endo/Other  negative endocrine ROS  Renal/GU negative Renal ROS     Musculoskeletal  (+) Arthritis ,   Abdominal   Peds  Hematology negative hematology ROS (+)   Anesthesia Other Findings   Reproductive/Obstetrics                             Anesthesia Physical Anesthesia Plan  ASA: II  Anesthesia Plan: General   Post-op Pain Management:    Induction: Intravenous  Airway Management Planned: LMA  Additional Equipment:   Intra-op Plan:   Post-operative Plan:   Informed Consent: I have reviewed the patients History and Physical, chart, labs and discussed the procedure including the risks, benefits and alternatives for the proposed anesthesia with the patient or authorized representative who has indicated his/her understanding and acceptance.     Plan Discussed with:   Anesthesia Plan Comments:         Anesthesia Quick Evaluation

## 2015-11-04 LAB — SURGICAL PATHOLOGY

## 2016-02-16 DIAGNOSIS — R739 Hyperglycemia, unspecified: Secondary | ICD-10-CM | POA: Insufficient documentation

## 2016-03-02 ENCOUNTER — Other Ambulatory Visit: Payer: Self-pay | Admitting: Orthopedic Surgery

## 2016-03-02 DIAGNOSIS — M170 Bilateral primary osteoarthritis of knee: Secondary | ICD-10-CM

## 2016-03-03 ENCOUNTER — Ambulatory Visit
Admission: RE | Admit: 2016-03-03 | Discharge: 2016-03-03 | Disposition: A | Discharge status: Home / Self Care | Payer: 59 | Source: Ambulatory Visit | Attending: Orthopedic Surgery | Admitting: Orthopedic Surgery

## 2016-03-03 DIAGNOSIS — M170 Bilateral primary osteoarthritis of knee: Secondary | ICD-10-CM

## 2016-04-06 ENCOUNTER — Encounter
Admission: RE | Admit: 2016-04-06 | Discharge: 2016-04-06 | Disposition: A | Discharge status: Home / Self Care | Payer: 59 | Source: Ambulatory Visit | Attending: Orthopedic Surgery | Admitting: Orthopedic Surgery

## 2016-04-06 DIAGNOSIS — Z01812 Encounter for preprocedural laboratory examination: Secondary | ICD-10-CM | POA: Diagnosis present

## 2016-04-06 DIAGNOSIS — M170 Bilateral primary osteoarthritis of knee: Secondary | ICD-10-CM | POA: Insufficient documentation

## 2016-04-06 HISTORY — DX: Abnormal results of liver function studies: R94.5

## 2016-04-06 HISTORY — DX: Other specified abnormal findings of blood chemistry: R79.89

## 2016-04-06 HISTORY — DX: Chronic kidney disease, unspecified: N18.9

## 2016-04-06 HISTORY — DX: Bipolar disorder, unspecified: F31.9

## 2016-04-06 LAB — CBC
HCT: 46 % (ref 40.0–52.0)
Hemoglobin: 16 g/dL (ref 13.0–18.0)
MCH: 31 pg (ref 26.0–34.0)
MCHC: 34.8 g/dL (ref 32.0–36.0)
MCV: 89.3 fL (ref 80.0–100.0)
PLATELETS: 320 10*3/uL (ref 150–440)
RBC: 5.16 MIL/uL (ref 4.40–5.90)
RDW: 13.5 % (ref 11.5–14.5)
WBC: 8.3 10*3/uL (ref 3.8–10.6)

## 2016-04-06 LAB — BASIC METABOLIC PANEL
ANION GAP: 10 (ref 5–15)
BUN: 12 mg/dL (ref 6–20)
CO2: 28 mmol/L (ref 22–32)
Calcium: 9.6 mg/dL (ref 8.9–10.3)
Chloride: 100 mmol/L — ABNORMAL LOW (ref 101–111)
Creatinine, Ser: 0.97 mg/dL (ref 0.61–1.24)
GFR calc Af Amer: >60 mL/min (ref >60)
GLUCOSE: 109 mg/dL — AB (ref 65–99)
POTASSIUM: 3.9 mmol/L (ref 3.5–5.1)
SODIUM: 138 mmol/L (ref 135–145)

## 2016-04-06 LAB — URINALYSIS, ROUTINE W REFLEX MICROSCOPIC
BACTERIA UA: NONE SEEN
Bilirubin Urine: NEGATIVE
Glucose, UA: NEGATIVE mg/dL
Hgb urine dipstick: NEGATIVE
Ketones, ur: NEGATIVE mg/dL
Leukocytes, UA: NEGATIVE
Nitrite: NEGATIVE
PROTEIN: 30 mg/dL — AB
SQUAMOUS EPITHELIAL / LPF: NONE SEEN
Specific Gravity, Urine: 1.019 (ref 1.005–1.030)
pH: 6 (ref 5.0–8.0)

## 2016-04-06 LAB — PROTIME-INR
INR: 1.06
PROTHROMBIN TIME: 13.8 s (ref 11.4–15.2)

## 2016-04-06 LAB — SEDIMENTATION RATE: Sed Rate: 8 mm/hr (ref 0–20)

## 2016-04-06 LAB — SURGICAL PCR SCREEN
MRSA, PCR: NEGATIVE
Staphylococcus aureus: POSITIVE — AB

## 2016-04-06 LAB — APTT: APTT: 31 s (ref 24–36)

## 2016-04-06 NOTE — Patient Instructions (Signed)
  Your procedure is scheduled on: 04/19/16 Report to Day Surgery.[facility]OND FLOOR To find out your arrival time please call [phone] between 1PM - 3PM on 18/18/17  Remember: Instructions that are not followed completely may result in serious medical risk, up to and including death, or upon the discretion of your surgeon and anesthesiologist your surgery may need to be rescheduled.    __X__ 1. Do not eat food or drink liquids after midnight. No gum chewing or hard candies.     ____ 2. No Alcohol for 24 hours before or after surgery.   ____ 3. Do Not Smoke For 24 Hours Prior to Your Surgery.   ____ 4. Bring all medications with you on the day of surgery if instructed.    _X___ 5. Notify your doctor if there is any change in your medical condition     (cold, fever, infections).       Do not wear jewelry, make-up, hairpins, clips or nail polish.  Do not wear lotions, powders, or perfumes. You may wear deodorant.  Do not shave 48 hours prior to surgery. Men may shave face and neck.  Do not bring valuables to the hospital.    [facility] is not responsible for any belongings or valuables.               Contacts, dentures or bridgework may not be worn into surgery.  Leave your suitcase in the car. After surgery it may be brought to your room.  For patients admitted to the hospital, discharge time is determined by your                treatment team.   Patients discharged the day of surgery will not be allowed to drive home.   Please read over the following fact sheets that you were given:   MRSA Information   __X__ Take these medicines the morning of surgery with A SIP OF WATER:    1. AMLODIPINE  2. GEMFIBROZIL  3. PAROXETINE  4.  5.  6.  ____ Fleet Enema (as directed)   X       Use CHG Soap as directed  ____ Use inhalers on the day of surgery  ____ Stop metformin 2 days prior to surgery    ____ Take 1/2 of usual insulin dose the night before surgery and none  on the morning of surgery.   _X___ Stop Coumadin/Plavix/aspirin on  STOP ASPIRIN LAST DOSE 04/11/16 ____ Stop Anti-inflammatories on    _X___ Stop supplements until after surgery.    STOP NOW  EXTRA VIT C /OCUVITE/ EXTRA LUTEIN  ____ Bring C-Pap to the hospital.

## 2016-04-07 LAB — URINE CULTURE: CULTURE: NO GROWTH

## 2016-04-08 LAB — TYPE AND SCREEN
ABO/RH(D): A POS
ANTIBODY SCREEN: NEGATIVE

## 2016-04-19 ENCOUNTER — Encounter: Payer: Self-pay | Admitting: *Deleted

## 2016-04-19 ENCOUNTER — Inpatient Hospital Stay: Payer: 59

## 2016-04-19 ENCOUNTER — Inpatient Hospital Stay
Admission: RE | Admit: 2016-04-19 | Discharge: 2016-04-22 | DRG: 462 | Disposition: A | Discharge status: Skilled Nursing Facility | Payer: 59 | Source: Ambulatory Visit | Attending: Orthopedic Surgery | Admitting: Orthopedic Surgery

## 2016-04-19 ENCOUNTER — Inpatient Hospital Stay: Payer: 59 | Admitting: Anesthesiology

## 2016-04-19 ENCOUNTER — Encounter
Admission: RE | Disposition: A | Discharge status: Skilled Nursing Facility | Payer: Self-pay | Source: Ambulatory Visit | Attending: Orthopedic Surgery

## 2016-04-19 DIAGNOSIS — R262 Difficulty in walking, not elsewhere classified: Secondary | ICD-10-CM

## 2016-04-19 DIAGNOSIS — M170 Bilateral primary osteoarthritis of knee: Principal | ICD-10-CM | POA: Diagnosis present

## 2016-04-19 DIAGNOSIS — Z888 Allergy status to other drugs, medicaments and biological substances status: Secondary | ICD-10-CM | POA: Diagnosis not present

## 2016-04-19 DIAGNOSIS — E785 Hyperlipidemia, unspecified: Secondary | ICD-10-CM | POA: Diagnosis present

## 2016-04-19 DIAGNOSIS — F319 Bipolar disorder, unspecified: Secondary | ICD-10-CM | POA: Diagnosis present

## 2016-04-19 DIAGNOSIS — Z7982 Long term (current) use of aspirin: Secondary | ICD-10-CM | POA: Diagnosis not present

## 2016-04-19 DIAGNOSIS — N189 Chronic kidney disease, unspecified: Secondary | ICD-10-CM | POA: Diagnosis present

## 2016-04-19 DIAGNOSIS — K760 Fatty (change of) liver, not elsewhere classified: Secondary | ICD-10-CM | POA: Diagnosis present

## 2016-04-19 DIAGNOSIS — M19049 Primary osteoarthritis, unspecified hand: Secondary | ICD-10-CM | POA: Diagnosis present

## 2016-04-19 DIAGNOSIS — Z79891 Long term (current) use of opiate analgesic: Secondary | ICD-10-CM

## 2016-04-19 DIAGNOSIS — I129 Hypertensive chronic kidney disease with stage 1 through stage 4 chronic kidney disease, or unspecified chronic kidney disease: Secondary | ICD-10-CM | POA: Diagnosis present

## 2016-04-19 DIAGNOSIS — Z79899 Other long term (current) drug therapy: Secondary | ICD-10-CM

## 2016-04-19 DIAGNOSIS — F419 Anxiety disorder, unspecified: Secondary | ICD-10-CM | POA: Diagnosis present

## 2016-04-19 DIAGNOSIS — I959 Hypotension, unspecified: Secondary | ICD-10-CM | POA: Diagnosis not present

## 2016-04-19 DIAGNOSIS — M6281 Muscle weakness (generalized): Secondary | ICD-10-CM

## 2016-04-19 DIAGNOSIS — D62 Acute posthemorrhagic anemia: Secondary | ICD-10-CM | POA: Diagnosis not present

## 2016-04-19 DIAGNOSIS — G8918 Other acute postprocedural pain: Secondary | ICD-10-CM

## 2016-04-19 HISTORY — PX: TOTAL KNEE ARTHROPLASTY: SHX125

## 2016-04-19 LAB — CBC
HEMATOCRIT: 39.3 % — AB (ref 40.0–52.0)
HEMOGLOBIN: 13.3 g/dL (ref 13.0–18.0)
MCH: 30.7 pg (ref 26.0–34.0)
MCHC: 33.8 g/dL (ref 32.0–36.0)
MCV: 90.7 fL (ref 80.0–100.0)
Platelets: 326 10*3/uL (ref 150–440)
RBC: 4.33 MIL/uL — AB (ref 4.40–5.90)
RDW: 13.3 % (ref 11.5–14.5)
WBC: 17.5 10*3/uL — ABNORMAL HIGH (ref 3.8–10.6)

## 2016-04-19 LAB — CREATININE, SERUM
Creatinine, Ser: 1.09 mg/dL (ref 0.61–1.24)
GFR calc Af Amer: >60 mL/min (ref >60)
GFR calc non Af Amer: >60 mL/min (ref >60)

## 2016-04-19 LAB — ABO/RH: ABO/RH(D): A POS

## 2016-04-19 SURGERY — ARTHROPLASTY, KNEE, BILATERAL, TOTAL
Anesthesia: General | Site: Knee | Laterality: Bilateral | Wound class: Clean

## 2016-04-19 MED ORDER — DIPHENHYDRAMINE HCL 12.5 MG/5ML PO ELIX: 12.5000 mg | ORAL_SOLUTION | Freq: Four times a day (QID) | ORAL | Status: DC | PRN

## 2016-04-19 MED ORDER — MENTHOL 3 MG MT LOZG
1.0000 | LOZENGE | OROMUCOSAL | Status: DC | PRN
  Filled 2016-04-19: qty 9

## 2016-04-19 MED ORDER — VITAMIN B-12 500 MCG PO TABS
500.0000 ug | ORAL_TABLET | Freq: Every day | ORAL | Status: DC
  Filled 2016-04-19 (×3): qty 1

## 2016-04-19 MED ORDER — SUGAMMADEX SODIUM 200 MG/2ML IV SOLN
INTRAVENOUS | Status: DC | PRN
  Administered 2016-04-19: 288.4 mg via INTRAVENOUS

## 2016-04-19 MED ORDER — PROPOFOL 10 MG/ML IV BOLUS
INTRAVENOUS | Status: DC | PRN
  Administered 2016-04-19: 250 mg via INTRAVENOUS

## 2016-04-19 MED ORDER — BENAZEPRIL HCL 20 MG PO TABS
40.0000 mg | ORAL_TABLET | Freq: Every day | ORAL | Status: DC
  Administered 2016-04-20 – 2016-04-22 (×2): 40 mg via ORAL
  Filled 2016-04-19 (×2): qty 2

## 2016-04-19 MED ORDER — BUPIVACAINE-EPINEPHRINE (PF) 0.25% -1:200000 IJ SOLN
INTRAMUSCULAR | Status: DC | PRN
  Administered 2016-04-19: 60 mL

## 2016-04-19 MED ORDER — METHOCARBAMOL 1000 MG/10ML IJ SOLN
500.0000 mg | Freq: Four times a day (QID) | INTRAVENOUS | Status: DC | PRN
  Filled 2016-04-19: qty 5

## 2016-04-19 MED ORDER — SODIUM CHLORIDE 0.9 % IJ SOLN
INTRAMUSCULAR | Status: AC
Start: 2016-04-19 — End: 2016-04-19
  Filled 2016-04-19: qty 50

## 2016-04-19 MED ORDER — DIPHENHYDRAMINE HCL 50 MG/ML IJ SOLN: 12.5000 mg | Freq: Four times a day (QID) | INTRAMUSCULAR | Status: DC | PRN

## 2016-04-19 MED ORDER — MIDAZOLAM HCL 2 MG/2ML IJ SOLN
INTRAMUSCULAR | Status: DC | PRN
  Administered 2016-04-19: 2 mg via INTRAVENOUS

## 2016-04-19 MED ORDER — BISACODYL 10 MG RE SUPP
10.0000 mg | Freq: Every day | RECTAL | Status: DC | PRN
  Filled 2016-04-19: qty 1

## 2016-04-19 MED ORDER — ENOXAPARIN SODIUM 30 MG/0.3ML ~~LOC~~ SOLN
30.0000 mg | Freq: Two times a day (BID) | SUBCUTANEOUS | Status: DC
  Administered 2016-04-20 – 2016-04-22 (×5): 30 mg via SUBCUTANEOUS
  Filled 2016-04-19 (×5): qty 0.3

## 2016-04-19 MED ORDER — DIPHENHYDRAMINE HCL 12.5 MG/5ML PO ELIX: 12.5000 mg | ORAL_SOLUTION | ORAL | Status: DC | PRN

## 2016-04-19 MED ORDER — SODIUM CHLORIDE 0.9 % IJ SOLN
INTRAMUSCULAR | Status: AC
  Filled 2016-04-19: qty 50

## 2016-04-19 MED ORDER — ONDANSETRON HCL 4 MG/2ML IJ SOLN: 4.0000 mg | Freq: Once | INTRAMUSCULAR | Status: DC | PRN

## 2016-04-19 MED ORDER — MAGNESIUM HYDROXIDE 400 MG/5ML PO SUSP
30.0000 mL | Freq: Every day | ORAL | Status: DC | PRN
  Administered 2016-04-21 – 2016-04-22 (×2): 30 mL via ORAL
  Filled 2016-04-19 (×2): qty 30

## 2016-04-19 MED ORDER — HYDROMORPHONE HCL 1 MG/ML IJ SOLN
1.0000 mg | Freq: Once | INTRAMUSCULAR | Status: AC
  Administered 2016-04-19: 1 mg via INTRAVENOUS
  Filled 2016-04-19: qty 1

## 2016-04-19 MED ORDER — PRAVASTATIN SODIUM 20 MG PO TABS
20.0000 mg | ORAL_TABLET | Freq: Every evening | ORAL | Status: DC
  Administered 2016-04-19 – 2016-04-21 (×3): 20 mg via ORAL
  Filled 2016-04-19 (×3): qty 1

## 2016-04-19 MED ORDER — PHENOL 1.4 % MT LIQD
1.0000 | OROMUCOSAL | Status: DC | PRN
  Filled 2016-04-19: qty 177

## 2016-04-19 MED ORDER — SUCCINYLCHOLINE CHLORIDE 20 MG/ML IJ SOLN
INTRAMUSCULAR | Status: DC | PRN
  Administered 2016-04-19: 140 mg via INTRAVENOUS

## 2016-04-19 MED ORDER — NEOMYCIN-POLYMYXIN B GU 40-200000 IR SOLN
Status: AC
  Filled 2016-04-19: qty 20

## 2016-04-19 MED ORDER — ZOLPIDEM TARTRATE 5 MG PO TABS: 5.0000 mg | ORAL_TABLET | Freq: Every evening | ORAL | Status: DC | PRN

## 2016-04-19 MED ORDER — AMLODIPINE BESYLATE 10 MG PO TABS
10.0000 mg | ORAL_TABLET | Freq: Every day | ORAL | Status: DC
  Administered 2016-04-20 – 2016-04-22 (×2): 10 mg via ORAL
  Filled 2016-04-19 (×2): qty 1

## 2016-04-19 MED ORDER — NEOMYCIN-POLYMYXIN B GU 40-200000 IR SOLN
Status: DC | PRN
  Administered 2016-04-19: 16 mL

## 2016-04-19 MED ORDER — BUPIVACAINE-EPINEPHRINE (PF) 0.25% -1:200000 IJ SOLN
INTRAMUSCULAR | Status: AC
  Filled 2016-04-19: qty 60

## 2016-04-19 MED ORDER — FAMOTIDINE 20 MG PO TABS
20.0000 mg | ORAL_TABLET | Freq: Once | ORAL | Status: AC
Start: 2016-04-19 — End: 2016-04-19
  Administered 2016-04-19: 20 mg via ORAL

## 2016-04-19 MED ORDER — NALOXONE HCL 0.4 MG/ML IJ SOLN: 0.4000 mg | INTRAMUSCULAR | Status: DC | PRN

## 2016-04-19 MED ORDER — BUPIVACAINE LIPOSOME 1.3 % IJ SUSP
INTRAMUSCULAR | Status: AC
  Filled 2016-04-19: qty 20

## 2016-04-19 MED ORDER — MORPHINE SULFATE (PF) 10 MG/ML IV SOLN
INTRAVENOUS | Status: AC
  Filled 2016-04-19: qty 2

## 2016-04-19 MED ORDER — CHLORTHALIDONE 25 MG PO TABS
12.5000 mg | ORAL_TABLET | Freq: Every day | ORAL | Status: DC
  Administered 2016-04-20 – 2016-04-22 (×3): 12.5 mg via ORAL
  Filled 2016-04-19 (×3): qty 1

## 2016-04-19 MED ORDER — METOCLOPRAMIDE HCL 10 MG PO TABS
5.0000 mg | ORAL_TABLET | Freq: Three times a day (TID) | ORAL | Status: DC | PRN
  Administered 2016-04-22: 10 mg via ORAL
  Filled 2016-04-19: qty 1

## 2016-04-19 MED ORDER — DOCUSATE SODIUM 100 MG PO CAPS
100.0000 mg | ORAL_CAPSULE | Freq: Two times a day (BID) | ORAL | Status: DC
  Administered 2016-04-19 – 2016-04-22 (×7): 100 mg via ORAL
  Filled 2016-04-19 (×7): qty 1

## 2016-04-19 MED ORDER — ACETAMINOPHEN 650 MG RE SUPP: 650.0000 mg | Freq: Four times a day (QID) | RECTAL | Status: DC | PRN

## 2016-04-19 MED ORDER — PHENYLEPHRINE HCL 10 MG/ML IJ SOLN
INTRAMUSCULAR | Status: DC | PRN
  Administered 2016-04-19: 25 ug/min via INTRAVENOUS

## 2016-04-19 MED ORDER — OCUVITE-LUTEIN PO CAPS
1.0000 | ORAL_CAPSULE | Freq: Every day | ORAL | Status: DC
  Administered 2016-04-20 – 2016-04-22 (×3): 1 via ORAL
  Filled 2016-04-19 (×3): qty 1

## 2016-04-19 MED ORDER — TRANEXAMIC ACID 1000 MG/10ML IV SOLN
1000.0000 mg | INTRAVENOUS | Status: AC
  Administered 2016-04-19: 1000 mg via INTRAVENOUS
  Filled 2016-04-19: qty 10

## 2016-04-19 MED ORDER — LUTEIN 6 MG PO TABS: 6.0000 mg | ORAL_TABLET | Freq: Every day | ORAL | Status: DC

## 2016-04-19 MED ORDER — MORPHINE SULFATE 2 MG/ML IV SOLN
INTRAVENOUS | Status: DC
  Administered 2016-04-19: 9 mg via INTRAVENOUS
  Administered 2016-04-19: 18:00:00 via INTRAVENOUS
  Administered 2016-04-20: 9 mg via INTRAVENOUS
  Administered 2016-04-20: 7.5 mg via INTRAVENOUS
  Administered 2016-04-20: 4 mg via INTRAVENOUS
  Administered 2016-04-20 – 2016-04-21 (×2): via INTRAVENOUS
  Filled 2016-04-19 (×3): qty 25

## 2016-04-19 MED ORDER — PAROXETINE HCL 20 MG PO TABS
20.0000 mg | ORAL_TABLET | Freq: Two times a day (BID) | ORAL | Status: DC
  Administered 2016-04-19 – 2016-04-22 (×6): 20 mg via ORAL
  Filled 2016-04-19 (×6): qty 1

## 2016-04-19 MED ORDER — SODIUM CHLORIDE 0.9 % IV SOLN
INTRAVENOUS | Status: DC
  Administered 2016-04-19: 16:00:00 via INTRAVENOUS

## 2016-04-19 MED ORDER — AMLODIPINE BESY-BENAZEPRIL HCL 10-40 MG PO CAPS: 1.0000 | ORAL_CAPSULE | ORAL | Status: DC

## 2016-04-19 MED ORDER — ONDANSETRON HCL 4 MG/2ML IJ SOLN: 4.0000 mg | Freq: Four times a day (QID) | INTRAMUSCULAR | Status: DC | PRN

## 2016-04-19 MED ORDER — ACETAMINOPHEN 10 MG/ML IV SOLN
INTRAVENOUS | Status: DC | PRN
  Administered 2016-04-19: 1000 mg via INTRAVENOUS

## 2016-04-19 MED ORDER — OXYCODONE HCL ER 15 MG PO T12A
15.0000 mg | EXTENDED_RELEASE_TABLET | Freq: Two times a day (BID) | ORAL | Status: DC
  Administered 2016-04-19 – 2016-04-22 (×5): 15 mg via ORAL
  Filled 2016-04-19 (×6): qty 1

## 2016-04-19 MED ORDER — LACTATED RINGERS IV SOLN
INTRAVENOUS | Status: DC
  Administered 2016-04-19 (×2): via INTRAVENOUS
  Administered 2016-04-19: 75 mL/h via INTRAVENOUS
  Administered 2016-04-19: 12:00:00 via INTRAVENOUS

## 2016-04-19 MED ORDER — FENTANYL CITRATE (PF) 100 MCG/2ML IJ SOLN
25.0000 ug | INTRAMUSCULAR | Status: DC | PRN
  Administered 2016-04-19 (×4): 25 ug via INTRAVENOUS

## 2016-04-19 MED ORDER — LIDOCAINE HCL (CARDIAC) 20 MG/ML IV SOLN
INTRAVENOUS | Status: DC | PRN
  Administered 2016-04-19: 100 mg via INTRAVENOUS

## 2016-04-19 MED ORDER — DEXTROSE 5 % IV SOLN
3.0000 g | Freq: Once | INTRAVENOUS | Status: AC
  Administered 2016-04-19: 1 g via INTRAVENOUS
  Administered 2016-04-19: 3 g via INTRAVENOUS
  Filled 2016-04-19: qty 3000

## 2016-04-19 MED ORDER — GEMFIBROZIL 600 MG PO TABS
600.0000 mg | ORAL_TABLET | Freq: Two times a day (BID) | ORAL | Status: DC
  Administered 2016-04-19 – 2016-04-22 (×6): 600 mg via ORAL
  Filled 2016-04-19 (×6): qty 1

## 2016-04-19 MED ORDER — ACETAMINOPHEN 10 MG/ML IV SOLN
INTRAVENOUS | Status: AC
  Filled 2016-04-19: qty 100

## 2016-04-19 MED ORDER — METHOCARBAMOL 500 MG PO TABS
500.0000 mg | ORAL_TABLET | Freq: Four times a day (QID) | ORAL | Status: DC | PRN
  Administered 2016-04-20 – 2016-04-22 (×4): 500 mg via ORAL
  Filled 2016-04-19 (×4): qty 1

## 2016-04-19 MED ORDER — ONDANSETRON HCL 4 MG/2ML IJ SOLN
INTRAMUSCULAR | Status: DC | PRN
  Administered 2016-04-19 (×2): 4 mg via INTRAVENOUS

## 2016-04-19 MED ORDER — ACETAMINOPHEN 325 MG PO TABS: 650.0000 mg | ORAL_TABLET | Freq: Four times a day (QID) | ORAL | Status: DC | PRN

## 2016-04-19 MED ORDER — DEXAMETHASONE SODIUM PHOSPHATE 10 MG/ML IJ SOLN
INTRAMUSCULAR | Status: DC | PRN
  Administered 2016-04-19: 10 mg via INTRAVENOUS

## 2016-04-19 MED ORDER — SODIUM CHLORIDE 0.9 % IV SOLN
INTRAVENOUS | Status: DC | PRN
  Administered 2016-04-19: 60 mL

## 2016-04-19 MED ORDER — MORPHINE SULFATE (PF) 2 MG/ML IV SOLN
2.0000 mg | INTRAVENOUS | Status: DC | PRN
  Administered 2016-04-19 (×2): 2 mg via INTRAVENOUS
  Filled 2016-04-19 (×2): qty 1

## 2016-04-19 MED ORDER — FAMOTIDINE 20 MG PO TABS
ORAL_TABLET | ORAL | Status: AC
  Administered 2016-04-19: 20 mg via ORAL
  Filled 2016-04-19: qty 1

## 2016-04-19 MED ORDER — ALUM & MAG HYDROXIDE-SIMETH 200-200-20 MG/5ML PO SUSP: 30.0000 mL | ORAL | Status: DC | PRN

## 2016-04-19 MED ORDER — OXYCODONE HCL 5 MG PO TABS
5.0000 mg | ORAL_TABLET | ORAL | Status: DC | PRN
  Administered 2016-04-19: 10 mg via ORAL
  Administered 2016-04-20 (×2): 5 mg via ORAL
  Administered 2016-04-22 (×4): 10 mg via ORAL
  Filled 2016-04-19 (×4): qty 2
  Filled 2016-04-19: qty 1
  Filled 2016-04-19: qty 2
  Filled 2016-04-19: qty 1

## 2016-04-19 MED ORDER — MAGNESIUM CITRATE PO SOLN
1.0000 | Freq: Once | ORAL | Status: AC | PRN
  Administered 2016-04-22: 1 via ORAL
  Filled 2016-04-19: qty 296

## 2016-04-19 MED ORDER — GABAPENTIN 300 MG PO CAPS
300.0000 mg | ORAL_CAPSULE | Freq: Three times a day (TID) | ORAL | Status: DC
  Administered 2016-04-19 – 2016-04-21 (×7): 300 mg via ORAL
  Filled 2016-04-19 (×7): qty 1

## 2016-04-19 MED ORDER — ADULT MULTIVITAMIN W/MINERALS CH
1.0000 | ORAL_TABLET | Freq: Every day | ORAL | Status: DC
  Administered 2016-04-20 – 2016-04-22 (×3): 1 via ORAL
  Filled 2016-04-19 (×5): qty 1

## 2016-04-19 MED ORDER — ASPIRIN 81 MG PO TABS
81.0000 mg | ORAL_TABLET | Freq: Every day | ORAL | Status: DC
  Filled 2016-04-19: qty 1

## 2016-04-19 MED ORDER — ALPRAZOLAM 0.5 MG PO TABS
0.5000 mg | ORAL_TABLET | Freq: Every evening | ORAL | Status: DC
  Administered 2016-04-19 – 2016-04-21 (×3): 0.5 mg via ORAL
  Filled 2016-04-19 (×3): qty 1

## 2016-04-19 MED ORDER — SODIUM CHLORIDE 0.9% FLUSH: 9.0000 mL | INTRAVENOUS | Status: DC | PRN

## 2016-04-19 MED ORDER — VITAMIN C 500 MG PO TABS
500.0000 mg | ORAL_TABLET | Freq: Every day | ORAL | Status: DC
  Administered 2016-04-20 – 2016-04-22 (×3): 500 mg via ORAL
  Filled 2016-04-19 (×3): qty 1

## 2016-04-19 MED ORDER — ONDANSETRON HCL 4 MG PO TABS: 4.0000 mg | ORAL_TABLET | Freq: Four times a day (QID) | ORAL | Status: DC | PRN

## 2016-04-19 MED ORDER — FENTANYL CITRATE (PF) 100 MCG/2ML IJ SOLN
INTRAMUSCULAR | Status: AC
  Administered 2016-04-19: 25 ug via INTRAVENOUS
  Filled 2016-04-19: qty 2

## 2016-04-19 MED ORDER — FOLIC ACID 1 MG PO TABS
500.0000 ug | ORAL_TABLET | Freq: Every day | ORAL | Status: DC
  Administered 2016-04-20 – 2016-04-22 (×3): 0.5 mg via ORAL
  Filled 2016-04-19 (×3): qty 1

## 2016-04-19 MED ORDER — FENTANYL CITRATE (PF) 100 MCG/2ML IJ SOLN
INTRAMUSCULAR | Status: DC | PRN
  Administered 2016-04-19 (×3): 50 ug via INTRAVENOUS
  Administered 2016-04-19: 100 ug via INTRAVENOUS
  Administered 2016-04-19: 50 ug via INTRAVENOUS

## 2016-04-19 MED ORDER — METOCLOPRAMIDE HCL 5 MG/ML IJ SOLN: 5.0000 mg | Freq: Three times a day (TID) | INTRAMUSCULAR | Status: DC | PRN

## 2016-04-19 MED ORDER — MORPHINE SULFATE (PF) 10 MG/ML IV SOLN
INTRAVENOUS | Status: DC | PRN
  Administered 2016-04-19: 20 mL via INTRAMUSCULAR

## 2016-04-19 MED ORDER — CEFAZOLIN SODIUM-DEXTROSE 2-4 GM/100ML-% IV SOLN: 2.0000 g | Freq: Once | INTRAVENOUS | Status: DC

## 2016-04-19 MED ORDER — CEFAZOLIN SODIUM-DEXTROSE 2-4 GM/100ML-% IV SOLN
2.0000 g | Freq: Four times a day (QID) | INTRAVENOUS | Status: AC
  Administered 2016-04-19 – 2016-04-20 (×3): 2 g via INTRAVENOUS
  Filled 2016-04-19 (×3): qty 100

## 2016-04-19 MED ORDER — ROCURONIUM BROMIDE 100 MG/10ML IV SOLN
INTRAVENOUS | Status: DC | PRN
  Administered 2016-04-19: 40 mg via INTRAVENOUS
  Administered 2016-04-19: 20 mg via INTRAVENOUS
  Administered 2016-04-19: 10 mg via INTRAVENOUS
  Administered 2016-04-19: 20 mg via INTRAVENOUS
  Administered 2016-04-19: 10 mg via INTRAVENOUS
  Administered 2016-04-19: 20 mg via INTRAVENOUS

## 2016-04-19 SURGICAL SUPPLY — 78 items
BANDAGE ACE 6X5 VEL STRL LF (GAUZE/BANDAGES/DRESSINGS) ×6 IMPLANT
BANDAGE ELASTIC 4 CLIP ST LF (GAUZE/BANDAGES/DRESSINGS) ×3 IMPLANT
BLADE SAW 1 (BLADE) ×6 IMPLANT
BLADE SURG SZ10 CARB STEEL (BLADE) ×6 IMPLANT
BLOCK CUTTING FEMUR 5+ LT MED (MISCELLANEOUS) IMPLANT
BLOCK CUTTING TIBIAL 4 RT MIS (MISCELLANEOUS) IMPLANT
BLOCK CUTTING TIBIAL 5 LT (MISCELLANEOUS) IMPLANT
BLOCK CUTTING TIBIAL 5 RT (MISCELLANEOUS) IMPLANT
BNDG COHESIVE 6X5 TAN STRL LF (GAUZE/BANDAGES/DRESSINGS) ×3 IMPLANT
CANISTER SUCT 1200ML W/VALVE (MISCELLANEOUS) ×3 IMPLANT
CANISTER SUCT 3000ML (MISCELLANEOUS) ×6 IMPLANT
CAPT KNEE TOTAL 3 ×6 IMPLANT
CATH FOL LEG HOLDER (MISCELLANEOUS) ×3 IMPLANT
CATH TRAY METER 16FR LF (MISCELLANEOUS) ×3 IMPLANT
CEMENT HV SMART SET (Cement) ×15 IMPLANT
CHLORAPREP W/TINT 26ML (MISCELLANEOUS) ×6 IMPLANT
COOLER POLAR GLACIER W/PUMP (MISCELLANEOUS) ×6 IMPLANT
CUFF TOURN 24 STER (MISCELLANEOUS) IMPLANT
CUFF TOURN 30 STER DUAL PORT (MISCELLANEOUS) IMPLANT
CUFF TOURN SGL QUICK 34 (TOURNIQUET CUFF) ×4
CUFF TRNQT CYL 34X4X40X1 (TOURNIQUET CUFF) ×2 IMPLANT
DRAPE EXTREMITY 106X87X128.5 (DRAPES) ×3 IMPLANT
DRAPE IMP U-DRAPE 54X76 (DRAPES) ×3 IMPLANT
DRAPE INCISE IOBAN 66X45 STRL (DRAPES) ×6 IMPLANT
DRAPE SHEET LG 3/4 BI-LAMINATE (DRAPES) ×15 IMPLANT
DRAPE U-SHAPE 47X51 STRL (DRAPES) ×3 IMPLANT
ELECT CAUTERY BLADE 6.4 (BLADE) ×6 IMPLANT
ELECT REM PT RETURN 9FT ADLT (ELECTROSURGICAL) ×3
ELECTRODE REM PT RTRN 9FT ADLT (ELECTROSURGICAL) ×1 IMPLANT
FEMUR BONE MODEL RIGHT IMPLANT
FEMUR CUTTING BLOCK RIGHT IMPLANT
FEMURE BONE MODEL LEFT IMPLANT
GAUZE PETRO XEROFOAM 1X8 (MISCELLANEOUS) ×6 IMPLANT
GAUZE SPONGE 4X4 12PLY STRL (GAUZE/BANDAGES/DRESSINGS) ×6 IMPLANT
GLOVE BIOGEL PI IND STRL 9 (GLOVE) ×2 IMPLANT
GLOVE BIOGEL PI INDICATOR 9 (GLOVE) ×4
GLOVE INDICATOR 8.0 STRL GRN (GLOVE) ×6 IMPLANT
GLOVE SURG ORTHO 8.0 STRL STRW (GLOVE) ×6 IMPLANT
GLOVE SURG SYN 9.0  PF PI (GLOVE) ×4
GLOVE SURG SYN 9.0 PF PI (GLOVE) ×2 IMPLANT
GOWN SRG 2XL LVL 4 RGLN SLV (GOWNS) ×2 IMPLANT
GOWN STRL NON-REIN 2XL LVL4 (GOWNS) ×4
GOWN STRL REUS W/ TWL LRG LVL3 (GOWN DISPOSABLE) ×4 IMPLANT
GOWN STRL REUS W/TWL LRG LVL3 (GOWN DISPOSABLE) ×8
GRADUATE 1200CC STRL 31836 (MISCELLANEOUS) ×3 IMPLANT
HANDLE YANKAUER SUCT BULB TIP (MISCELLANEOUS) ×3 IMPLANT
HANDPIECE INTERPULSE COAX TIP (DISPOSABLE) ×2
HOOD PEEL AWAY FLYTE STAYCOOL (MISCELLANEOUS) ×12 IMPLANT
IMMBOLIZER KNEE 19 BLUE UNIV (SOFTGOODS) ×6 IMPLANT
KIT RM TURNOVER STRD PROC AR (KITS) ×3 IMPLANT
KNEE MEDACTA TIBIAL/FEMORAL BL (Knees) ×6 IMPLANT
KNIFE SCULPS 14X20 (INSTRUMENTS) ×3 IMPLANT
NDL SAFETY 18GX1.5 (NEEDLE) ×3 IMPLANT
NEEDLE SPNL 18GX3.5 QUINCKE PK (NEEDLE) ×6 IMPLANT
NEEDLE SPNL 20GX3.5 QUINCKE YW (NEEDLE) ×6 IMPLANT
NS IRRIG 1000ML POUR BTL (IV SOLUTION) ×3 IMPLANT
PACK TOTAL KNEE (MISCELLANEOUS) ×3 IMPLANT
PAD WRAPON POLAR KNEE (MISCELLANEOUS) ×2 IMPLANT
SET HNDPC FAN SPRY TIP SCT (DISPOSABLE) ×1 IMPLANT
SOL .9 NS 3000ML IRR  AL (IV SOLUTION) ×2
SOL .9 NS 3000ML IRR UROMATIC (IV SOLUTION) ×1 IMPLANT
SPONGE LAP 18X18 5 PK (GAUZE/BANDAGES/DRESSINGS) ×3 IMPLANT
STAPLER SKIN PROX 35W (STAPLE) ×6 IMPLANT
STOCKINETTE IMPERV 14X48 (MISCELLANEOUS) ×3 IMPLANT
STRAP SAFETY BODY (MISCELLANEOUS) ×3 IMPLANT
SUCTION FRAZIER HANDLE 10FR (MISCELLANEOUS) ×4
SUCTION TUBE FRAZIER 10FR DISP (MISCELLANEOUS) ×2 IMPLANT
SUT DVC 2 QUILL PDO  T11 36X36 (SUTURE) ×4
SUT DVC 2 QUILL PDO T11 36X36 (SUTURE) ×2 IMPLANT
SUT ETHIBOND NAB CT1 #1 30IN (SUTURE) ×6 IMPLANT
SUT V-LOC 90 ABS DVC 3-0 CL (SUTURE) ×6 IMPLANT
SYR 20CC LL (SYRINGE) ×3 IMPLANT
SYR 50ML LL SCALE MARK (SYRINGE) ×6 IMPLANT
TIBIAL BONE MODEL LEFT (MISCELLANEOUS) IMPLANT
TIP HIGH FLOW INTERPULSE (MISCELLANEOUS) ×3 IMPLANT
TOWEL OR 17X26 4PK STRL BLUE (TOWEL DISPOSABLE) ×3 IMPLANT
TOWER CARTRIDGE SMART MIX (DISPOSABLE) ×6 IMPLANT
WRAPON POLAR PAD KNEE (MISCELLANEOUS) ×6

## 2016-04-19 NOTE — Anesthesia Procedure Notes (Signed)
Procedure Name: Intubation Date/Time: 04/19/2016 8:02 AM Performed by: Nelda Marseille Pre-anesthesia Checklist: Patient identified, Patient being monitored, Timeout performed, Emergency Drugs available and Suction available Patient Re-evaluated:Patient Re-evaluated prior to inductionOxygen Delivery Method: Circle system utilized Preoxygenation: Pre-oxygenation with 100% oxygen Intubation Type: IV induction Ventilation: Mask ventilation without difficulty Laryngoscope Size: Mac and 3 Grade View: Grade I Tube type: Oral Tube size: 7.5 mm Number of attempts: 1 Airway Equipment and Method: Stylet Placement Confirmation: ETT inserted through vocal cords under direct vision,  positive ETCO2 and breath sounds checked- equal and bilateral Secured at: 21 cm Tube secured with: Tape Dental Injury: Teeth and Oropharynx as per pre-operative assessment

## 2016-04-19 NOTE — Op Note (Signed)
04/19/2016  12:41 PM  PATIENT:  Nathan Chambers  50 y.o. male  PRE-OPERATIVE DIAGNOSIS:  OSTEOARTHRITIS OF BOTH KNEES,RIGHT KNEE PAIN,LEFT KNEE PAIN  POST-OPERATIVE DIAGNOSIS:  OSTEOARTHRITIS OF BOTH KNEES,RIGHT KNEE PAIN,LEFT KNEE PAIN  PROCEDURE:  Procedure(s): TOTAL KNEE BILATERAL (Bilateral)  SURGEON: Laurene Footman, MD  ASSISTANTS: Rachelle Hora [facility]  ANESTHESIA:   general  EBL:  Total I/O In: 2000 [I.V.:2000] Out: 550 [Urine:250; Blood:300]  BLOOD ADMINISTERED:none  DRAINS: none   LOCAL MEDICATIONS USED:  MARCAINE    and OTHER morphine and Exparel  SPECIMEN:  No Specimen  DISPOSITION OF SPECIMEN:  N/A  COUNTS:  YES  TOURNIQUET:   right knee 92 minutes; left knee 103 minutes at 300 mmHg  IMPLANTS: Medacta GMK sphere system 5+ right and left femoral components 5 tibia right and left with a 10 mm inserts bilaterally and 4 patellas all components cemented with short stems  DICTATION: .Dragon Dictation patient brought the operating room and after adequate anesthesia was obtained the right leg was prepped and draped in sterile fashion with a tourniquet by the upper thigh after patient identification and timeout procedures were completed, he was raised. Midline skin incision was made followed by medial parapatellar arthrotomy. Inspection revealed extensive osteophyte formation eburnated bone in the medial and patellofemoral compartments as well as loss of almost all cartilage laterally. The anterior horn of the meniscus medial meniscus was excised and the medial Elevated with Exposure of the Proximal Tibia the Proximal Tibia Cut Was Carried out Using the My Knee Cutting Guide. So I Did the Same Thing Was Done to the Distal Femur with Distal Femoral Cut Created of Anterior Posterior Chamfer Cuts Made Using a 5+ Cutting Block. With Excision of the Residual Posterior Tibia with Some Difficulty Because of Stiffness There Are Some Loose Bodies and Posterior Osteophytes Are Removed  Removed with Use of a Curved Osteotome along the Posterior Horn of the Menisci Was Quite Difficult Secondary to the Stiffness in the Knee with a with after Excision of the Spurs of Menisci to Be Could Be Brought Forward and Tibial Preparation Carried out with a 5 Tibia with Proximal Tibial Preparation Using a Drill and All Hand Reaming and Then Proximal Reaming a Keel Punch Was Placed Followed by the 5+ Femur and a 10 Mm Insert Gave Good Stability and Full Extension. Patella Was Cut Using Freehand Technique and Measured 30 Mm Cut down to 20 and Measured a Size 4 after Drill Holes Were Made. This Point the Tourniquet Was Let down and the above Local Was Infiltrated with Half of the Usual X Burrell Dose Diluted to 60 Cc and Infiltrated throughout the Knee. The Hemostasis Checked Electrocautery the Synovectomy Was Performed Prior to Injection Because of Extensive Synovitis within the Knee. The Components Were Then Cemented in Placed Knee Held in Extension. After the Cemented Set Excess Cement Was Removed and the Knee Thoroughly Irrigated and Tourniquet Let down There Arthrotomy Was Repaired Using a Heavy Quill Followed by 3-0 50-Loc subcutaneous closure and skin staples. Xeroform 4 x 4's ABDs and web roll and Ace wrap applied along Polar Care. The left leg was addressed in identical fashion with the same implants and actually very similar appearance the bone but more eburnation and more spurs present. The patient was then sent to recovery in stable condition  PLAN OF CARE: Admit to inpatient   PATIENT DISPOSITION:  PACU - hemodynamically stable.

## 2016-04-19 NOTE — H&P (Signed)
Reviewed paper H+P, will be scanned into chart. No changes noted.  

## 2016-04-19 NOTE — Evaluation (Signed)
Physical Therapy Evaluation Patient Details Name: Nathan Chambers MRN: [id number] DOB: 1965/05/09 Today's Date: 04/19/2016   History of Present Illness  50 y/o male s/p b/l TKAs 12/19.  Clinical Impression  Pt eager to work with PT and do as much as possible w/o assist.  He was very eager to get to sitting and showed very good UE strength to get to standing with b/l KIs donned.  Pt was able to do SLRs with only minimal warm up and overall showed good strength, activity tolerance and mobility especially considering both knees were replaced earlier today.  Pt with expected pain and discomfort but was able to actively flex each knee to ~80 degrees and overall showed good motivation and effort.  Pt did not wish to get to recliner today but was happy to sit at EOB with KIs off after brief walking bout.  Pt eager to work with PT tomorrow and try to do a lot more.     Follow Up Recommendations Home health PT    Equipment Recommendations       Recommendations for Other Services       Precautions / Restrictions Restrictions Weight Bearing Restrictions: Yes RLE Weight Bearing: Weight bearing as tolerated LLE Weight Bearing: Weight bearing as tolerated      Mobility  Bed Mobility Overal bed mobility: Modified Independent             General bed mobility comments: heavy reliance on rails/trapeze but ultimately able to get to/from supine w/o direct assist  Transfers Overall transfer level: Modified independent Equipment used: Rolling walker (2 wheeled)             General transfer comment: Pt needing considerable cuing and explaination, but with raised bed he was able to get to standing w/o direct assist (pt requests the PT not be too close and let him try all he could w/o assist)  Ambulation/Gait Ambulation/Gait assistance: Min guard Ambulation Distance (Feet): 5 Feet Assistive device: Rolling walker (2 wheeled)       General Gait Details: Pt initially hesistant but with  walker and b/l KIs he was able to take a few steps forward and back and with heavy UE use was safe and felt relatively confident, especially considering POD0 b/l TKA  Stairs            Wheelchair Mobility    Modified Rankin (Stroke Patients Only)       Balance Overall balance assessment: Modified Independent                                           Pertinent Vitals/Pain Pain Assessment: 0-10 Pain Score: 7  Pain Location: b/l knees, L>R (later sx)    Home Living Family/patient expects to be discharged to:: Private residence Living Arrangements: Spouse/significant other Available Help at Discharge: Family   Home Access: Ramped entrance       [place]: Environmental consultant - 2 wheels      Prior Function Level of Independence: Independent         Comments: Pt working, able to stay active, but b/l knee OA was getting to be too much.     Hand Dominance        Extremity/Trunk Assessment   Upper Extremity Assessment Upper Extremity Assessment: Overall WFL for tasks assessed (very good b/l UE strength)    Lower Extremity Assessment Lower Extremity Assessment:  Generalized weakness;Overall [facility] for tasks assessed (expected post-op weakness)       Communication   Communication: No difficulties  Cognition Arousal/Alertness: Awake/alert Behavior During Therapy: WFL for tasks assessed/performed;Impulsive Overall Cognitive Status: Within Functional Limits for tasks assessed                 General Comments: Pt very eager to work with PT and do all he could on his own    General Comments      Exercises Total Joint Exercises Ankle Circles/Pumps: AROM;10 reps;Both Hip ABduction/ADduction: Strengthening;5 reps;Both Straight Leg Raises: 5 reps;Both;AROM Knee Flexion: AROM;5 reps;Both Goniometric ROM: b/l knees similar, both lacking ~3-5 degrees to ~80    Assessment/Plan    PT Assessment Patient needs continued PT services  PT Problem  List Decreased strength;Decreased range of motion;Decreased activity tolerance;Decreased balance;Decreased mobility;Decreased coordination;Decreased knowledge of use of DME;Decreased safety awareness;Decreased knowledge of precautions;Pain          PT Treatment Interventions DME instruction;Gait training;Functional mobility training;Therapeutic activities;Therapeutic exercise;Balance training;Neuromuscular re-education;Patient/family education    PT Goals (Current goals can be found in the Care Plan section)  Acute Rehab PT Goals Patient Stated Goal: go home PT Goal Formulation: With patient Time For Goal Achievement: 05/03/16 Potential to Achieve Goals: Good    Frequency BID   Barriers to discharge        Co-evaluation               End of Session Equipment Utilized During Treatment: Gait belt;Right knee immobilizer;Left knee immobilizer Activity Tolerance: Patient tolerated treatment well Patient left: with bed alarm set;with call bell/phone within reach;with nursing/sitter in room;with family/visitor present           Time: [id number] PT Time Calculation (min) (ACUTE ONLY): 32 min   Charges:   PT Evaluation $PT Eval Low Complexity: 1 Procedure PT Treatments $Therapeutic Exercise: 8-22 mins   PT G Codes:        Kreg Shropshire, DPT 04/19/2016, 5:49 PM

## 2016-04-19 NOTE — Progress Notes (Signed)
PHARMACIST - PHYSICIAN ORDER COMMUNICATION  CONCERNING: P&T Medication Policy on Herbal Medications  DESCRIPTION:  This patient's order for:  leutien  has been noted.  This product(s) is classified as an "herbal" or natural product. Due to a lack of definitive safety studies or FDA approval, nonstandard manufacturing practices, plus the potential risk of unknown drug-drug interactions while on inpatient medications, the Pharmacy and Therapeutics Committee does not permit the use of "herbal" or natural products of this type within [facility].   ACTION TAKEN: The pharmacy department is unable to verify this order at this time and your patient has been informed of this safety policy. Please reevaluate patient's clinical condition at discharge and address if the herbal or natural product(s) should be resumed at that time.  Rexene Edison, PharmD Clinical Pharmacist  04/19/2016 2:11 PM

## 2016-04-19 NOTE — Anesthesia Preprocedure Evaluation (Signed)
Anesthesia Evaluation  Patient identified by MRN, date of birth, ID band Patient awake    Reviewed: Allergy & Precautions, NPO status , Patient's Chart, lab work & pertinent test results  History of Anesthesia Complications Negative for: history of anesthetic complications  Airway Mallampati: III  TM Distance: >3 FB     Dental  (+) Chipped   Pulmonary neg pulmonary ROS,    Pulmonary exam normal        Cardiovascular hypertension, Pt. on medications Normal cardiovascular exam     Neuro/Psych  Headaches, PSYCHIATRIC DISORDERS Anxiety Bipolar Disorder negative neurological ROS     GI/Hepatic negative GI ROS, Neg liver ROS, Non-alcoholic fatty liver   Endo/Other  negative endocrine ROS  Renal/GU Renal InsufficiencyRenal diseasenegative Renal ROS  negative genitourinary   Musculoskeletal  (+) Arthritis ,   Abdominal   Peds negative pediatric ROS (+)  Hematology negative hematology ROS (+)   Anesthesia Other Findings Past Medical History: No date: Anxiety No date: Arthritis     Comment: knees, hands No date: Bipolar disorder (HCC) No date: Chronic kidney disease     Comment: arf 2014 due to dehydration No date: Headache No date: Hemorrhoids No date: Hyperlipidemia No date: Hypertension No date: LFT elevation No date: NAFLD (nonalcoholic fatty liver disease)  Reproductive/Obstetrics                            Anesthesia Physical  Anesthesia Plan  ASA: II  Anesthesia Plan: General   Post-op Pain Management:    Induction: Intravenous and Rapid sequence  Airway Management Planned: Oral ETT  Additional Equipment:   Intra-op Plan:   Post-operative Plan: Extubation in OR  Informed Consent: I have reviewed the patients History and Physical, chart, labs and discussed the procedure including the risks, benefits and alternatives for the proposed anesthesia with the patient or  authorized representative who has indicated his/her understanding and acceptance.   Dental advisory given  Plan Discussed with: CRNA and Surgeon  Anesthesia Plan Comments:        Anesthesia Quick Evaluation

## 2016-04-19 NOTE — Transfer of Care (Signed)
Immediate Anesthesia Transfer of Care Note  Patient: Nathan Chambers  Procedure(s) Performed: Procedure(s): TOTAL KNEE BILATERAL (Bilateral)  Patient Location: PACU  Anesthesia Type:General  Level of Consciousness: sedated  Airway & Oxygen Therapy: Patient Spontanous Breathing and Patient connected to face mask oxygen  Post-op Assessment: Report given to RN and Post -op Vital signs reviewed and stable  Post vital signs: Reviewed and stable  Last Vitals:  Vitals:   04/19/16 0624  BP: (!) 141/81  Pulse: 75  Resp: 16  Temp: 36.9 C    Last Pain:  Vitals:   04/19/16 0624  TempSrc: Oral  PainSc: 7       Patients Stated Pain Goal: 1 ([id number])  Complications: No apparent anesthesia complications

## 2016-04-19 NOTE — NC FL2 (Signed)
[place] LEVEL OF CARE SCREENING TOOL     IDENTIFICATION  Patient Name: Nathan Chambers Birthdate: 26-Apr-1966 Sex: male Admission Date (Current Location): 04/19/2016  [place] and [place] Number:  Engineering geologist and Address:  [facility], [address], [place], [place] [address]      Provider Number: [id number]  Attending Physician Name and Address:  Hessie Knows, MD  Relative Name and Phone Number:       Current Level of Care: Hospital Recommended Level of Care: [place] Prior Approval Number:    Date Approved/Denied:   PASRR Number:  ([id number])  Discharge Plan: SNF    Current Diagnoses: Patient Active Problem List   Diagnosis Date Noted  . Bilateral primary osteoarthritis of knee 04/19/2016   Hypertension    Hyperlipidemia, unspecified    Hx of migraines    Hx of hemorrhoids    Elbow fracture, right    Fatty liver disease, nonalcoholic  ultrasound Q000111Q  Acute renal failure , unspecified (CMS-HCC)       Orientation RESPIRATION BLADDER Height & Weight     Self, Time, Situation, Place  O2 (2 Liters Oxygen ) Continent Weight: (!) 318 lb (144.2 kg) Height:  6\' 3"  (190.5 cm)  BEHAVIORAL SYMPTOMS/MOOD NEUROLOGICAL BOWEL NUTRITION STATUS   (none)  (none) Continent Diet (Regular Diet )  AMBULATORY STATUS COMMUNICATION OF NEEDS Skin   Extensive Assist Verbally Surgical wounds (Bilateral Knee incisions. )                       Personal Care Assistance Level of Assistance  Bathing, Feeding, Dressing Bathing Assistance: Limited assistance Feeding assistance: Independent Dressing Assistance: Limited assistance     Functional Limitations Info  Sight, Hearing, Speech Sight Info: Adequate Hearing Info: Adequate Speech Info: Adequate    SPECIAL CARE FACTORS FREQUENCY  PT (By licensed PT), OT (By licensed OT)     PT Frequency:  (5) OT Frequency:  (5)             Contractures      Additional Factors Info  Code Status, Allergies Code Status Info:  (Full Code. ) Allergies Info:  (Etodolac)           Current Medications (04/19/2016):  This is the current hospital active medication list Current Facility-Administered Medications  Medication Dose Route Frequency Provider Last Rate Last Dose  . 0.9 %  sodium chloride infusion   Intravenous Continuous Hessie Knows, MD 75 mL/hr at 04/19/16 1605    . acetaminophen (TYLENOL) tablet 650 mg  650 mg Oral Q6H PRN Hessie Knows, MD       Or  . acetaminophen (TYLENOL) suppository 650 mg  650 mg Rectal Q6H PRN Hessie Knows, MD      . ALPRAZolam Duanne Moron) tablet 0.5 mg  0.5 mg Oral QPM Hessie Knows, MD   0.5 mg at 04/19/16 1733  . alum & mag hydroxide-simeth (MAALOX/MYLANTA) 200-200-20 MG/5ML suspension 30 mL  30 mL Oral Q4H PRN Hessie Knows, MD      . Derrill Memo ON 04/20/2016] benazepril (LOTENSIN) tablet 40 mg  40 mg Oral Daily Hessie Knows, MD       And  . Derrill Memo ON 04/20/2016] amLODipine (NORVASC) tablet 10 mg  10 mg Oral Daily Hessie Knows, MD      . Derrill Memo ON 04/20/2016] aspirin tablet 81 mg  81 mg Oral Daily Hessie Knows, MD      . bisacodyl (DULCOLAX) suppository 10  mg  10 mg Rectal Daily PRN Hessie Knows, MD      . ceFAZolin (ANCEF) IVPB 2g/100 mL premix  2 g Intravenous Q6H Hessie Knows, MD   2 g at 04/19/16 1605  . [START ON 04/20/2016] chlorthalidone (HYGROTON) tablet 12.5 mg  12.5 mg Oral Daily Hessie Knows, MD      . diphenhydrAMINE (BENADRYL) injection 12.5 mg  12.5 mg Intravenous Q6H PRN Hessie Knows, MD       Or  . diphenhydrAMINE (BENADRYL) 12.5 MG/5ML elixir 12.5 mg  12.5 mg Oral Q6H PRN Hessie Knows, MD      . docusate sodium (COLACE) capsule 100 mg  100 mg Oral BID Hessie Knows, MD   100 mg at 04/19/16 1504  . [START ON 04/20/2016] enoxaparin (LOVENOX) injection 30 mg  30 mg Subcutaneous Q12H Hessie Knows, MD      . Derrill Memo ON Q000111Q folic acid (FOLVITE) tablet 0.5 mg  500 mcg Oral Daily  Hessie Knows, MD      . gabapentin (NEURONTIN) capsule 300 mg  300 mg Oral TID Hessie Knows, MD   300 mg at 04/19/16 1606  . gemfibrozil (LOPID) tablet 600 mg  600 mg Oral BID AC Hessie Knows, MD   600 mg at 04/19/16 1606  . magnesium citrate solution 1 Bottle  1 Bottle Oral Once PRN Hessie Knows, MD      . magnesium hydroxide (MILK OF MAGNESIA) suspension 30 mL  30 mL Oral Daily PRN Hessie Knows, MD      . menthol-cetylpyridinium (CEPACOL) lozenge 3 mg  1 lozenge Oral PRN Hessie Knows, MD       Or  . phenol (CHLORASEPTIC) mouth spray 1 spray  1 spray Mouth/Throat PRN Hessie Knows, MD      . methocarbamol (ROBAXIN) tablet 500 mg  500 mg Oral Q6H PRN Hessie Knows, MD       Or  . methocarbamol (ROBAXIN) 500 mg in dextrose 5 % 50 mL IVPB  500 mg Intravenous Q6H PRN Hessie Knows, MD      . metoCLOPramide (REGLAN) tablet 5-10 mg  5-10 mg Oral Q8H PRN Hessie Knows, MD       Or  . metoCLOPramide (REGLAN) injection 5-10 mg  5-10 mg Intravenous Q8H PRN Hessie Knows, MD      . morphine (MORPHINE) 2 mg/mL PCA injection   Intravenous Q4H Hessie Knows, MD      . morphine 2 MG/ML injection 2 mg  2 mg Intravenous Q1H PRN Hessie Knows, MD   2 mg at 04/19/16 1503  . multivitamin tablet 1 tablet  1 tablet Oral Daily Hessie Knows, MD      . Derrill Memo ON 04/20/2016] multivitamin-lutein (OCUVITE-LUTEIN) capsule 1 capsule  1 capsule Oral Daily Hessie Knows, MD      . naloxone [facility]) injection 0.4 mg  0.4 mg Intravenous PRN Hessie Knows, MD       And  . sodium chloride flush (NS) 0.9 % injection 9 mL  9 mL Intravenous PRN Hessie Knows, MD      . ondansetron [facility]) injection 4 mg  4 mg Intravenous Q6H PRN Hessie Knows, MD      . ondansetron [facility]) tablet 4 mg  4 mg Oral Q6H PRN Hessie Knows, MD      . oxyCODONE (Oxy IR/ROXICODONE) immediate release tablet 5-10 mg  5-10 mg Oral Q3H PRN Hessie Knows, MD   10 mg at 04/19/16 1504  . oxyCODONE (OXYCONTIN) 12 hr tablet 15 mg  15  mg Oral Q12H Hessie Knows, MD      .  PARoxetine (PAXIL) tablet 20 mg  20 mg Oral BID Hessie Knows, MD      . pravastatin (PRAVACHOL) tablet 20 mg  20 mg Oral QPM Hessie Knows, MD   20 mg at 04/19/16 1733  . [START ON 04/20/2016] vitamin B-12 (CYANOCOBALAMIN) tablet 500 mcg  500 mcg Oral Daily Hessie Knows, MD      . Derrill Memo ON 04/20/2016] vitamin C (ASCORBIC ACID) tablet 500 mg  500 mg Oral Daily Hessie Knows, MD      . zolpidem [facility]) tablet 5 mg  5 mg Oral QHS PRN,MR X 1 Hessie Knows, MD         Discharge Medications: Please see discharge summary for a list of discharge medications.  Relevant Imaging Results:  Relevant Lab Results:   Additional Information  (SSN: [id number])  Kasiya Burck, Veronia Beets, LCSW

## 2016-04-20 LAB — CBC
HEMATOCRIT: 34.8 % — AB (ref 40.0–52.0)
Hemoglobin: 11.7 g/dL — ABNORMAL LOW (ref 13.0–18.0)
MCH: 30.9 pg (ref 26.0–34.0)
MCHC: 33.7 g/dL (ref 32.0–36.0)
MCV: 91.9 fL (ref 80.0–100.0)
PLATELETS: 324 10*3/uL (ref 150–440)
RBC: 3.79 MIL/uL — ABNORMAL LOW (ref 4.40–5.90)
RDW: 13.6 % (ref 11.5–14.5)
WBC: 13.1 10*3/uL — AB (ref 3.8–10.6)

## 2016-04-20 LAB — BASIC METABOLIC PANEL
Anion gap: 9 (ref 5–15)
BUN: 19 mg/dL (ref 6–20)
CALCIUM: 8.6 mg/dL — AB (ref 8.9–10.3)
CO2: 29 mmol/L (ref 22–32)
Chloride: 103 mmol/L (ref 101–111)
Creatinine, Ser: 1.15 mg/dL (ref 0.61–1.24)
GFR calc Af Amer: >60 mL/min (ref >60)
GLUCOSE: 153 mg/dL — AB (ref 65–99)
Potassium: 4.3 mmol/L (ref 3.5–5.1)
Sodium: 141 mmol/L (ref 135–145)

## 2016-04-20 MED ORDER — ASPIRIN EC 81 MG PO TBEC
81.0000 mg | DELAYED_RELEASE_TABLET | Freq: Every day | ORAL | Status: DC
  Administered 2016-04-20 – 2016-04-22 (×3): 81 mg via ORAL
  Filled 2016-04-20 (×3): qty 1

## 2016-04-20 NOTE — Progress Notes (Signed)
   Subjective: 1 Day Post-Op Procedure(s) (LRB): TOTAL KNEE BILATERAL (Bilateral) Patient reports pain as 7 on 0-10 scale.   Patient is well, and has had no acute complaints or problems Denies any CP, SOB, ABD pain. We will continue therapy today.    Objective: Vital signs in last 24 hours: Temp:  [97.6 F (36.4 C)-99.1 F (37.3 C)] 97.7 F (36.5 C) (12/20 0421) Pulse Rate:  [83-101] 83 (12/20 0421) Resp:  [11-20] 19 (12/20 0421) BP: (103-152)/(54-102) 130/72 (12/20 0421) SpO2:  [90 %-99 %] 97 % (12/20 0421) FiO2 (%):  [28 %] 28 % (12/19 1346)  Intake/Output from previous day: 12/19 0701 - 12/20 0700 In: 2790 [P.O.:240; I.V.:2550] Out: 1680 [Urine:1380; Blood:300] Intake/Output this shift: No intake/output data recorded.   Recent Labs  04/19/16 1424 04/20/16 0525  HGB 13.3 11.7*    Recent Labs  04/19/16 1424 04/20/16 0525  WBC 17.5* 13.1*  RBC 4.33* 3.79*  HCT 39.3* 34.8*  PLT 326 324    Recent Labs  04/19/16 1424 04/20/16 0525  NA  --  141  K  --  4.3  CL  --  103  CO2  --  29  BUN  --  19  CREATININE 1.09 1.15  GLUCOSE  --  153*  CALCIUM  --  8.6*   No results for input(s): LABPT, INR in the last 72 hours.  EXAM General - Patient is Alert, Appropriate and Oriented Extremity - Neurovascular intact Sensation intact distally Intact pulses distally Dorsiflexion/Plantar flexion intact No cellulitis present Compartment soft Dressing - dressing C/D/I and no drainage Motor Function - intact, moving foot and toes well on exam.   Past Medical History:  Diagnosis Date  . Anxiety   . Arthritis    knees, hands  . Bipolar disorder ([place])   . Chronic kidney disease    arf 2014 due to dehydration  . Headache   . Hemorrhoids   . Hyperlipidemia   . Hypertension   . LFT elevation   . NAFLD (nonalcoholic fatty liver disease)     Assessment/Plan:   1 Day Post-Op Procedure(s) (LRB): TOTAL KNEE BILATERAL (Bilateral) Active Problems:   Bilateral  primary osteoarthritis of knee   Acute post op blood loss anemia   Estimated body mass index is 39.75 kg/m as calculated from the following:   Height as of this encounter: 6\' 3"  (1.905 m).   Weight as of this encounter: 144.2 kg (318 lb). Advance diet Up with therapy  Needs BM CM to assist with discharge Acute post op blood loss anemia - Recheck labs in the am  DVT Prophylaxis - Lovenox, Foot Pumps and TED hose Weight-Bearing as tolerated to left and right leg   T. Rachelle Hora, PA-C [place] 04/20/2016, 8:14 AM

## 2016-04-20 NOTE — Evaluation (Signed)
Occupational Therapy Evaluation Patient Details Name: Nathan Chambers MRN: [id number] DOB: October 12, 1965 Today's Date: 04/20/2016    History of Present Illness Pt. is a 50 y.o. male who was admitted to [facility] for bilateral TKRs.    Clinical Impression   Pt. Is a 50 y.o. male who was admitted for bilateral TKRs. Pt presents with limited ROM, Pain, weakness, and impaired functional mobility, and decreased activity tolerance which hinder his ability to complete ADL and IADL tasks. Pt. could benefit from skilled OT services to review A/E use for LE ADLs, to review necessary home modifications, and to improve functional mobility for ADL/IADLs in order to work towards regaining Independence with ADL/IADLs.     Follow Up Recommendations  No OT follow up    Equipment Recommendations       Recommendations for Other Services       Precautions / Restrictions Restrictions Weight Bearing Restrictions: Yes RLE Weight Bearing: Weight bearing as tolerated LLE Weight Bearing: Weight bearing as tolerated      Mobility Bed Mobility Overal bed mobility: Modified Independent                Transfers Overall transfer level: Modified independent                    Balance Overall balance assessment: Modified Independent                                          ADL Overall ADL's : Needs assistance/impaired Eating/Feeding: Set up   Grooming: Set up               Lower Body Dressing: Maximal assistance                 General ADL Comments: Pt. education was provided about A/E use for LE ADLs.     Vision     Perception     Praxis      Pertinent Vitals/Pain Pain Assessment: 0-10 Pain Score: 8  Pain Location: bilateral knees     Hand Dominance Right   Extremity/Trunk Assessment Upper Extremity Assessment Upper Extremity Assessment: Overall WFL for tasks assessed           Communication Communication Communication: No  difficulties   Cognition   Behavior During Therapy: WFL for tasks assessed/performed Overall Cognitive Status: Within Functional Limits for tasks assessed                     General Comments       Exercises       Shoulder Instructions      Home Living Family/patient expects to be discharged to:: Private residence Living Arrangements: Spouse/significant other Available Help at Discharge: Family   Home Access: Ramped entrance     Home Layout: One level     Bathroom Shower/Tub: Tub only;Curtain         Home Equipment: Environmental consultant - 2 wheels          Prior Functioning/Environment Level of Independence: Independent        Comments: Independent with ADLs, and IADLs. Pt. was working        OT Problem List: Decreased strength;Impaired UE functional use;Decreased knowledge of use of DME or AE;Decreased activity tolerance;Pain;Decreased range of motion   OT Treatment/Interventions: Self-care/ADL training;Therapeutic exercise;DME and/or AE instruction;Patient/family education;Therapeutic activities;Energy conservation    OT Goals(Current goals  can be found in the care plan section) Acute Rehab OT Goals Patient Stated Goal: To return home OT Goal Formulation: With patient Potential to Achieve Goals: Good  OT Frequency: Min 1X/week   Barriers to D/C:            Co-evaluation              End of Session    Activity Tolerance: Patient tolerated treatment well Patient left: in bed;with bed alarm set;with call bell/phone within reach   Time: 0938-1000 OT Time Calculation (min): 22 min Charges:  OT General Charges $OT Visit: 1 Procedure OT Evaluation $OT Eval Moderate Complexity: 1 Procedure G-Codes:    Harrel Carina, MS, OTR/L 04/20/2016, 10:55 AM

## 2016-04-20 NOTE — Clinical Social Work Placement (Signed)
   CLINICAL SOCIAL WORK PLACEMENT  NOTE  Date:  04/20/2016  Patient Details  Name: Nathan Chambers MRN: [id number] Date of Birth: 1965-12-24  Clinical Social Work is seeking post-discharge placement for this patient at the [place] level of care (*CSW will initial, date and re-position this form in  chart as items are completed):  Yes   Patient/family provided with [place] Work Department's list of facilities offering this level of care within the geographic area requested by the patient (or if unable, by the patient's family).  Yes   Patient/family informed of their freedom to choose among providers that offer the needed level of care, that participate in Medicare, Medicaid or managed care program needed by the patient, have an available bed and are willing to accept the patient.  Yes   Patient/family informed of [place]'s ownership interest in [facility] and [facility], as well as of the fact that they are under no obligation to receive care at these facilities.  PASRR submitted to EDS on 04/19/16     PASRR number received on 04/19/16     Existing PASRR number confirmed on       FL2 transmitted to all facilities in geographic area requested by pt/family on 04/19/16     FL2 transmitted to all facilities within larger geographic area on       Patient informed that his/her managed care company has contracts with or will negotiate with certain facilities, including the following:        Yes   Patient/family informed of bed offers received.  Patient chooses bed at  ([facility] )     Physician recommends and patient chooses bed at      Patient to be transferred to   on  .  Patient to be transferred to facility by       Patient family notified on   of transfer.  Name of family member notified:        PHYSICIAN       Additional Comment:    _______________________________________________ Robie Oats, Veronia Beets, LCSW 04/20/2016, 5:49  PM

## 2016-04-20 NOTE — Progress Notes (Signed)
Asked by RN CM to assess pt for possible inpt rehab admission per PTA recommendations. Noted OT recommends [facility] with no OT follow up. PTA changed recommendation to CIR, SNF. Pt is min guard assist 80 feet today POD #1. Pt is not a candidate for an inpt rehab admission at this level. Recommend HH or SNF if pt fails to progress to level to d/c directly home in next 48 hrs. I have alerted RN CM of my recommendations. [id number]

## 2016-04-20 NOTE — Progress Notes (Signed)
Physical Therapy Treatment Patient Details Name: Nathan Chambers MRN: [id number] DOB: [birth year] Today's Date: 04/20/2016    History of Present Illness Pt. is a 50 y.o. male who was admitted to [facility] for bilateral TKRs.     PT Comments    Pt with decreased pain this pm agreeable to trial ambulation. Performed supine to sit with use of features. Sit to stand min guard with bed elevated and additional time required. Demonstrated G balance upon standing with no LOB however significant use of UE upon RW.  Pt ambulated approx 18ft with RW with step to pattern and x 1 standing rest break 2/2 UE fatigue. Pt ambulated on RA with stats maintaining in low 90s and dropping to 87% upon sitting. Pt able to recover to >90% with cues for PLB as noted that pt tends to be a mouth breather. Pt returned to room via chair with alarm set and all needs met.   Follow Up Recommendations  CIR;SNF     Equipment Recommendations  Other (comment) (defer to post acute rehab)    Recommendations for Other Services       Precautions / Restrictions Precautions Required Braces or Orthoses: Knee Immobilizer - Right;Knee Immobilizer - Left Restrictions Weight Bearing Restrictions: Yes RLE Weight Bearing: Weight bearing as tolerated LLE Weight Bearing: Weight bearing as tolerated    Mobility  Bed Mobility Overal bed mobility: Modified Independent                Transfers Overall transfer level: Needs assistance Equipment used: Rolling walker (2 wheeled) Transfers: Sit to/from Stand Sit to Stand: Min guard (bed elevated)         General transfer comment: additional time required  Ambulation/Gait Ambulation/Gait assistance: Min guard Ambulation Distance (Feet): 80 Feet Assistive device: Rolling walker (2 wheeled) (chair follow) Gait Pattern/deviations: Step-to pattern;Antalgic     General Gait Details: noted use of UE on RW    Stairs            Wheelchair Mobility    Modified Rankin  (Stroke Patients Only)       Balance Overall balance assessment: Needs assistance Sitting-balance support: Feet supported Sitting balance-Leahy Scale: Good     Standing balance support: Bilateral upper extremity supported Standing balance-Leahy Scale: Good                      Cognition Arousal/Alertness: Awake/alert Behavior During Therapy: WFL for tasks assessed/performed Overall Cognitive Status: Within Functional Limits for tasks assessed                      Exercises Total Joint Exercises Hip ABduction/ADduction: AROM;Strengthening;Both;20 reps;Supine (manually resisted abd/add) Straight Leg Raises: AAROM;Strengthening;Both;20 reps;Supine Knee Flexion: AAROM;Strengthening;Both;15 reps;Supine Goniometric ROM: L=R 80 degrees, increased effort/strethching required for LLE  Other Exercises Other Exercises: SAQ x20 bilateral    General Comments        Pertinent Vitals/Pain Pain Assessment: 0-10 Pain Score: 4  Pain Location: B knees Pain Descriptors / Indicators: Sore Pain Intervention(s): Limited activity within patient's tolerance;Monitored during session    Home Living                      Prior Function            PT Goals (current goals can now be found in the care plan section) Acute Rehab PT Goals Patient Stated Goal: To return home Progress towards PT goals: Progressing toward goals    Frequency  BID      PT Plan Current plan remains appropriate    Co-evaluation             End of Session Equipment Utilized During Treatment: Gait belt;Right knee immobilizer;Left knee immobilizer;Oxygen Activity Tolerance: Patient tolerated treatment well Patient left: in chair;with call bell/phone within reach;with chair alarm set;with family/visitor present     Time: 1410-1458 PT Time Calculation (min) (ACUTE ONLY): 48 min  Charges:  $Gait Training: 8-22 mins $Therapeutic Exercise: 8-22 mins $Therapeutic Activity:  23-37 mins                    G Codes:      Satoya Feeley 2016-05-03, 4:08 PM

## 2016-04-20 NOTE — Progress Notes (Signed)
Physical Therapy Treatment Patient Details Name: MAKSIM PEREGOY MRN: [id number] DOB: [birth year] Today's Date: 04/20/2016    History of Present Illness Pt. is a 50 y.o. male who was admitted to [facility] for bilateral TKRs.     PT Comments    Pt presented in bed agreeable to therapy. C/o increased pain this am received pain meds prior to session. Pt attempted to transfer to EOB however significant increased pain, agreeable to perform supine therex. Performed therex as noted in chart. Pt required some active assit for initiating LLE at times however able to complete actively after several reps. Instructed pt in pacing with activities for pain mangement. PROM approximately 80degrees bilaterally however additional stretching required for LLE. Pt left in bed after session with all needs met .   Follow Up Recommendations        Equipment Recommendations       Recommendations for Other Services       Precautions / Restrictions Restrictions Weight Bearing Restrictions: Yes RLE Weight Bearing: Weight bearing as tolerated LLE Weight Bearing: Weight bearing as tolerated    Mobility  Bed Mobility Overal bed mobility: Modified Independent (use of features )                Transfers Overall transfer level: Modified independent                  Ambulation/Gait                 Stairs            Wheelchair Mobility    Modified Rankin (Stroke Patients Only)       Balance Overall balance assessment: Modified Independent                                  Cognition Arousal/Alertness: Awake/alert Behavior During Therapy: WFL for tasks assessed/performed Overall Cognitive Status: Within Functional Limits for tasks assessed                      Exercises Total Joint Exercises Hip ABduction/ADduction: AROM;Strengthening;Both;20 reps;Supine (manually resisted abd/add) Straight Leg Raises: AAROM;Strengthening;Both;20 reps;Supine Knee  Flexion: AAROM;Strengthening;Both;15 reps;Supine Goniometric ROM: L=R 80 degrees, increased effort/strethching required for LLE  Other Exercises Other Exercises: SAQ x20 bilateral    General Comments        Pertinent Vitals/Pain Pain Assessment: 0-10 Pain Score: 7  Pain Location: B knees    Home Living Family/patient expects to be discharged to:: Private residence Living Arrangements: Spouse/significant other Available Help at Discharge: Family   Home Access: Ramped entrance   Home Layout: One level Home Equipment: Environmental consultant - 2 wheels      Prior Function Level of Independence: Independent      Comments: Independent with ADLs, and IADLs. Pt. was working   PT Goals (current goals can now be found in the care plan section) Acute Rehab PT Goals Patient Stated Goal: To return home    Frequency           PT Plan      Co-evaluation             End of Session Equipment Utilized During Treatment: Oxygen Activity Tolerance: Patient tolerated treatment well;Patient limited by pain Patient left: in bed;with call bell/phone within reach;with bed alarm set     Time: 2841-3244 PT Time Calculation (min) (ACUTE ONLY): 34 min  Charges:  $Therapeutic Exercise: 8-22 mins $  Therapeutic Activity: 8-22 mins                    G Codes:      Conall Vangorder  Janda Cargo, PTA 04/20/2016, 1:31 PM

## 2016-04-20 NOTE — Care Management (Signed)
Case discussed with PT. Requested [facility] evaluate patient for inpatient rehabilitation.

## 2016-04-20 NOTE — Clinical Social Work Note (Signed)
Clinical Social Work Assessment  Patient Details  Name: Nathan Chambers MRN: [id number] Date of Birth: 1966/04/13  Date of referral:  04/20/16               Reason for consult:  Facility Placement                Permission sought to share information with:  Chartered certified accountant granted to share information::  Yes, Verbal Permission Granted  Name::      [place]::   [place]   Relationship::     Contact Information:     Housing/Transportation Living arrangements for the past 2 months:  [place] of Information:  Patient Patient Interpreter Needed:  None Criminal Activity/Legal Involvement Pertinent to Current Situation/Hospitalization:  No - Comment as needed Significant Relationships:  Adult Children, Spouse Lives with:  Spouse Do you feel safe going back to the place where you live?  Yes Need for family participation in patient care:  Yes (Comment)  Care giving concerns:  Patient lives in [place] with his wife Angie.    Social Worker assessment / plan:  Holiday representative (CSW) received SNF consult. PT is recommending CIR VS. SNF. CSW is familiar with patient from [facility]. CSW met with patient and his mother in law and sister in law were at bedside. CSW introduced self and explained role of CSW department. Patient reported that he lives in [place] with his wife. CSW explained that PT is recommending CIR and explained the difference between CIR and SNF. Patient reported that he does not want to go to [facility] and wants to go to [facility] in [place].   [facility] complete and faxed out. CSW presented bed offers. Patient chose [facility]. [facility] liaision is aware of accepted bed offer. CSW will continue to follow and assist as needed.   Employment status:  Retired Advertising copywriter PT Recommendations:  [place], [place] / Referral to community resources:   [place]  Patient/Family's Response to care:  Patient and his family are agreeable for patient to go to [facility] when D/C'ed.   Patient/Family's Understanding of and Emotional Response to Diagnosis, Current Treatment, and Prognosis: Patient was very pleasant and thanked CSW for assistance.   Emotional Assessment Appearance:  Appears stated age Attitude/Demeanor/Rapport:    Affect (typically observed):  Accepting, Adaptable, Pleasant Orientation:  Oriented to Self, Oriented to Place, Oriented to  Time, Oriented to Situation Alcohol / Substance use:  Not Applicable Psych involvement (Current and /or in the community):  No (Comment)  Discharge Needs  Concerns to be addressed:  Discharge Planning Concerns Readmission within the last 30 days:  No Current discharge risk:  Dependent with Mobility Barriers to Discharge:  Continued Medical Work up   [employer], Veronia Beets, LCSW 04/20/2016, 5:50 PM

## 2016-04-21 LAB — CBC
HEMATOCRIT: 27.8 % — AB (ref 40.0–52.0)
Hemoglobin: 9.5 g/dL — ABNORMAL LOW (ref 13.0–18.0)
MCH: 31.2 pg (ref 26.0–34.0)
MCHC: 34.2 g/dL (ref 32.0–36.0)
MCV: 91.1 fL (ref 80.0–100.0)
PLATELETS: 261 10*3/uL (ref 150–440)
RBC: 3.05 MIL/uL — AB (ref 4.40–5.90)
RDW: 13.5 % (ref 11.5–14.5)
WBC: 11.1 10*3/uL — AB (ref 3.8–10.6)

## 2016-04-21 LAB — BASIC METABOLIC PANEL
ANION GAP: 9 (ref 5–15)
BUN: 33 mg/dL — AB (ref 6–20)
CO2: 26 mmol/L (ref 22–32)
Calcium: 8.2 mg/dL — ABNORMAL LOW (ref 8.9–10.3)
Chloride: 99 mmol/L — ABNORMAL LOW (ref 101–111)
Creatinine, Ser: 2.5 mg/dL — ABNORMAL HIGH (ref 0.61–1.24)
GFR calc Af Amer: 32 mL/min — ABNORMAL LOW (ref >60)
GFR, EST NON AFRICAN AMERICAN: 28 mL/min — AB (ref >60)
GLUCOSE: 147 mg/dL — AB (ref 65–99)
POTASSIUM: 4.2 mmol/L (ref 3.5–5.1)
Sodium: 134 mmol/L — ABNORMAL LOW (ref 135–145)

## 2016-04-21 MED ORDER — FE FUMARATE-B12-VIT C-FA-IFC PO CAPS
1.0000 | ORAL_CAPSULE | Freq: Two times a day (BID) | ORAL | Status: DC
  Administered 2016-04-21 – 2016-04-22 (×3): 1 via ORAL
  Filled 2016-04-21 (×3): qty 1

## 2016-04-21 NOTE — Progress Notes (Signed)
Patient is alert and oriented. Voiding well. Received milk of mag and still needs a Bm.

## 2016-04-21 NOTE — Care Management (Addendum)
CIR has declined patient. He does not me insurance criteria. Agreeable to [facility].

## 2016-04-21 NOTE — Progress Notes (Signed)
PT Cancellation Note  Patient Details Name: Nathan Chambers MRN: [id number] DOB: [birth year]   Cancelled Treatment:    Reason Eval/Treat Not Completed: Fatigue/lethargy limiting ability to participate. Spoke with nursing prior to treatment attempt regarding findings for right knee bleeding during morning session. Nursing notes incision/staples intact. Nursing also notes pt is very lethargic, like due to muscle relaxer. Treatment attempted; pt sleeping soundly. Able to awaken pt; however, pt unable to answer questions or complete thought when asked before falling back to sleep. Spoke with pt's mother in law about performing quad sets with extension stretch and knee flexion stretches should pt awaken this evening to allow for safe sitting and exercise participation. Re attempt treatment tomorrow.    Larae Grooms, PTA 04/21/2016, 3:11 PM

## 2016-04-21 NOTE — Progress Notes (Signed)
Physical Therapy Treatment Patient Details Name: Nathan Chambers MRN: [id number] DOB: 02/21/1966 Today's Date: 04/21/2016    History of Present Illness Pt. is a 50 y.o. male who was admitted to [facility] for bilateral TKRs.     PT Comments    Pt agreeable to PT, but reports he is having difficulty time awakening today. Pt also complains of increased pain in bilateral knees. (8/10) Pt impulsive this session requiring increased cues/instruction to follow therapists instruction. Some safety concerns with pt's impulsivity. Pt requires Min A for Left lower extremity out of bed today. Instructed in exercises/stretching for bilateral knees with improving range. Pt did not wish to use immobilizers this session due to painful to the touch knees and feels he can "do it". Pt is not able to straight leg raise either leg at this time. Pt unable to attain even a partial stand today with max A x 2 and bed elevated. Before returning pt to bed to don immobilizers, pt's Right lower extremity begins bleeding significantly onto the floor. Pt returned to bed with assist for Bilateral lower extremities and nursing contacted. Nursing and additional medical staff in the room. Floor secretary instructed to contact surgeon and environmental services. Continue PT as appropriate this afternoon.   Follow Up Recommendations  SNF     Equipment Recommendations       Recommendations for Other Services       Precautions / Restrictions Precautions Precautions: Fall;Knee Restrictions Weight Bearing Restrictions: Yes RLE Weight Bearing: Weight bearing as tolerated LLE Weight Bearing: Weight bearing as tolerated    Mobility  Bed Mobility Overal bed mobility: Needs Assistance Bed Mobility: Supine to Sit;Sit to Supine     Supine to sit: Min assist Sit to supine: Mod assist   General bed mobility comments: Assist with LLE over edge of bed. Effortful and heavy use of UEs. Assist for BLEs to return to  bee  Transfers Overall transfer level: Needs assistance Equipment used: Rolling walker (2 wheeled) Transfers: Sit to/from Stand Sit to Stand: From elevated surface;Max assist;+2 physical assistance (unable to accomplish this session)         General transfer comment: Pt wished to attempt without knee immobilizers due to pain to the touch. Multiple attempts, but unable, before being able to don, pt began bleeding significantly from R knee.    Ambulation/Gait                 Stairs            Wheelchair Mobility    Modified Rankin (Stroke Patients Only)       Balance Overall balance assessment: Needs assistance Sitting-balance support: Feet supported Sitting balance-Leahy Scale: Good                              Cognition Arousal/Alertness: Awake/alert (states having a hard time waking today) Behavior During Therapy: WFL for tasks assessed/performed;Impulsive (heavy cues to follow instructions versus impulsivity) Overall Cognitive Status: Within Functional Limits for tasks assessed                      Exercises Total Joint Exercises Quad Sets: Strengthening;Both;20 reps;Supine Straight Leg Raises: AAROM;10 reps;Both;Supine Long Arc Quad: AAROM;Both;10 reps;Seated Knee Flexion: AAROM;Both;10 reps;Seated (3 positions each rep with 10 sec hold for stretch. ) Goniometric ROM: R: 0-100; L: -2 to 93     General Comments        Pertinent Vitals/Pain Pain  Assessment: 0-10 Pain Score: 8  Pain Location: B knees (increased from yesterday) Pain Intervention(s): Limited activity within patient's tolerance;Monitored during session;Premedicated before session;Repositioned;Ice applied    Home Living                      Prior Function            PT Goals (current goals can now be found in the care plan section) Progress towards PT goals: PT to reassess next treatment    Frequency    BID      PT Plan Current plan remains  appropriate    Co-evaluation             End of Session Equipment Utilized During Treatment: Gait belt;Oxygen Activity Tolerance: Patient limited by fatigue;Patient limited by pain;Other (comment) (bleeding R knee) Patient left: in bed;with family/visitor present;with nursing/sitter in room     Time: [id number] PT Time Calculation (min) (ACUTE ONLY): 42 min  Charges:  $Therapeutic Exercise: 8-22 mins $Therapeutic Activity: 23-37 mins                    G CodesLarae Grooms, PTA 04/21/2016, 12:56 PM

## 2016-04-21 NOTE — Anesthesia Postprocedure Evaluation (Signed)
Anesthesia Post Note  Patient: Nathan Chambers  Procedure(s) Performed: Procedure(s) (LRB): TOTAL KNEE BILATERAL (Bilateral)  Patient location during evaluation: PACU Anesthesia Type: General Level of consciousness: awake and alert and oriented Pain management: pain level controlled Vital Signs Assessment: post-procedure vital signs reviewed and stable Respiratory status: spontaneous breathing Cardiovascular status: blood pressure returned to baseline Anesthetic complications: no     Last Vitals:  Vitals:   04/21/16 0513 04/21/16 0753  BP:  (!) 100/47  Pulse:  83  Resp: 14   Temp:  36.8 C    Last Pain:  Vitals:   04/21/16 0753  TempSrc: Oral  PainSc:                  Eriyanna Kofoed

## 2016-04-21 NOTE — Progress Notes (Addendum)
   Subjective: 2 Days Post-Op Procedure(s) (LRB): TOTAL KNEE BILATERAL (Bilateral) Patient reports pain as 7 on 0-10 scale.   Patient is well, and has had no acute complaints or problems Denies any CP, SOB, ABD pain. We will continue therapy today.    Objective: Vital signs in last 24 hours: Temp:  [97.7 F (36.5 C)-98.4 F (36.9 C)] 98.4 F (36.9 C) (12/21 0403) Pulse Rate:  [79-98] 79 (12/21 0403) Resp:  [12-20] 14 (12/21 0513) BP: (94-141)/(51-104) 94/51 (12/21 0403) SpO2:  [92 %-98 %] 94 % (12/21 0513)  Intake/Output from previous day: 12/20 0701 - 12/21 0700 In: 2038.8 [P.O.:480; I.V.:1558.8] Out: 450 [Urine:450] Intake/Output this shift: No intake/output data recorded.   Recent Labs  04/19/16 1424 04/20/16 0525 04/21/16 0441  HGB 13.3 11.7* 9.5*    Recent Labs  04/20/16 0525 04/21/16 0441  WBC 13.1* 11.1*  RBC 3.79* 3.05*  HCT 34.8* 27.8*  PLT 324 261    Recent Labs  04/20/16 0525 04/21/16 0441  NA 141 134*  K 4.3 4.2  CL 103 99*  CO2 29 26  BUN 19 33*  CREATININE 1.15 2.50*  GLUCOSE 153* 147*  CALCIUM 8.6* 8.2*   No results for input(s): LABPT, INR in the last 72 hours.  EXAM General - Patient is Alert, Appropriate and Oriented Extremity - Neurovascular intact Sensation intact distally Intact pulses distally Dorsiflexion/Plantar flexion intact No cellulitis present Compartment soft Dressing - dressing C/D/I and no drainage Motor Function - intact, moving foot and toes well on exam.   Past Medical History:  Diagnosis Date  . Anxiety   . Arthritis    knees, hands  . Bipolar disorder ([place])   . Chronic kidney disease    arf 2014 due to dehydration  . Headache   . Hemorrhoids   . Hyperlipidemia   . Hypertension   . LFT elevation   . NAFLD (nonalcoholic fatty liver disease)     Assessment/Plan:   2 Days Post-Op Procedure(s) (LRB): TOTAL KNEE BILATERAL (Bilateral) Active Problems:   Bilateral primary osteoarthritis of knee   Acute post op blood loss anemia    Acute renal insufficiency  Estimated body mass index is 39.75 kg/m as calculated from the following:   Height as of this encounter: 6\' 3"  (1.905 m).   Weight as of this encounter: 144.2 kg (318 lb). Advance diet Up with therapy  Needs BM CM to assist with discharge Acute post op blood loss anemia - Recheck labs in the am, start Iron Acute renal insufficiency - likely pre renal, patient hypotensive. will dc PCA and hold lisinopril. Continue with IV fluids. Recheck labs in the am  DVT Prophylaxis - Lovenox, Foot Pumps and TED hose Weight-Bearing as tolerated to left and right leg   T. Rachelle Hora, PA-C [place] 04/21/2016, 7:48 AM

## 2016-04-22 LAB — BASIC METABOLIC PANEL
Anion gap: 10 (ref 5–15)
BUN: 43 mg/dL — AB (ref 6–20)
CO2: 26 mmol/L (ref 22–32)
CREATININE: 2.06 mg/dL — AB (ref 0.61–1.24)
Calcium: 8.5 mg/dL — ABNORMAL LOW (ref 8.9–10.3)
Chloride: 98 mmol/L — ABNORMAL LOW (ref 101–111)
GFR calc Af Amer: 41 mL/min — ABNORMAL LOW (ref >60)
GFR, EST NON AFRICAN AMERICAN: 35 mL/min — AB (ref >60)
Glucose, Bld: 122 mg/dL — ABNORMAL HIGH (ref 65–99)
POTASSIUM: 4.2 mmol/L (ref 3.5–5.1)
Sodium: 134 mmol/L — ABNORMAL LOW (ref 135–145)

## 2016-04-22 LAB — CBC
HCT: 26.7 % — ABNORMAL LOW (ref 40.0–52.0)
Hemoglobin: 9 g/dL — ABNORMAL LOW (ref 13.0–18.0)
MCH: 30.7 pg (ref 26.0–34.0)
MCHC: 33.7 g/dL (ref 32.0–36.0)
MCV: 91.1 fL (ref 80.0–100.0)
PLATELETS: 263 10*3/uL (ref 150–440)
RBC: 2.93 MIL/uL — AB (ref 4.40–5.90)
RDW: 13.3 % (ref 11.5–14.5)
WBC: 11.8 10*3/uL — ABNORMAL HIGH (ref 3.8–10.6)

## 2016-04-22 MED ORDER — OXYCODONE HCL 5 MG PO TABS: 5.0000 mg | ORAL_TABLET | ORAL | 0 refills | Status: DC | PRN

## 2016-04-22 MED ORDER — AMLODIPINE BESYLATE 10 MG PO TABS: 10.0000 mg | ORAL_TABLET | Freq: Every day | ORAL | 0 refills | Status: DC

## 2016-04-22 MED ORDER — NALOXONE HCL 0.4 MG/ML IJ SOLN: 0.4000 mg | INTRAMUSCULAR | 0 refills | Status: DC | PRN

## 2016-04-22 MED ORDER — OXYCODONE HCL ER 15 MG PO T12A: 15.0000 mg | EXTENDED_RELEASE_TABLET | Freq: Two times a day (BID) | ORAL | 0 refills | Status: AC

## 2016-04-22 MED ORDER — ENOXAPARIN SODIUM 40 MG/0.4ML ~~LOC~~ SOLN: 40.0000 mg | SUBCUTANEOUS | 0 refills | Status: DC

## 2016-04-22 NOTE — Discharge Instructions (Signed)

## 2016-04-22 NOTE — Clinical Social Work Placement (Signed)
   CLINICAL SOCIAL WORK PLACEMENT  NOTE  Date:  04/22/2016  Patient Details  Name: Nathan Chambers MRN: [id number] Date of Birth: 1966/03/22  Clinical Social Work is seeking post-discharge placement for this patient at the [place] level of care (*CSW will initial, date and re-position this form in  chart as items are completed):  Yes   Patient/family provided with [facility] Work Department's list of facilities offering this level of care within the geographic area requested by the patient (or if unable, by the patient's family).  Yes   Patient/family informed of their freedom to choose among providers that offer the needed level of care, that participate in Medicare, Medicaid or managed care program needed by the patient, have an available bed and are willing to accept the patient.  Yes   Patient/family informed of [facility]'s ownership interest in [facility] and [facility], as well as of the fact that they are under no obligation to receive care at these facilities.  PASRR submitted to EDS on 04/19/16     PASRR number received on 04/19/16     Existing PASRR number confirmed on       FL2 transmitted to all facilities in geographic area requested by pt/family on 04/19/16     FL2 transmitted to all facilities within larger geographic area on       Patient informed that his/her managed care company has contracts with or will negotiate with certain facilities, including the following:        Yes   Patient/family informed of bed offers received.  Patient chooses bed at  ([facility] )     Physician recommends and patient chooses bed at      Patient to be transferred to  ([facility] ) on 04/22/16.  Patient to be transferred to facility by  [facility] )     Patient family notified on 04/22/16 of transfer.  Name of family member notified:   (Patinet's wife Levada Dy is at bedside and aware of D/C today. )     PHYSICIAN       Additional  Comment:    _______________________________________________ Maame Dack, Veronia Beets, LCSW 04/22/2016, 12:24 PM

## 2016-04-22 NOTE — Progress Notes (Signed)
Physical Therapy Treatment Patient Details Name: Nathan Chambers MRN: [id number] DOB: 17-Jun-1965 Today's Date: 04/22/2016    History of Present Illness Pt. is a 50 y.o. male who was admitted to [facility] for bilateral TKRs.     PT Comments    Pt is able to participate with PT better today, but is still having significant issues with transfer to standing (as expected with b/l LE KIs)  He was unable to do SLRs today and both knees are still quite stiff (L flexion <75 degrees).  He shows good effort but does require a lot of cuing and reinforcement to do what is needed. Pt with good quad contraction w/o ROM, but even SAQ were a struggle for him (L more limited than R).  Pt eager to continue with PT but is clearly not doing well enough to safely go home; he will need rehab.  Follow Up Recommendations  SNF     Equipment Recommendations       Recommendations for Other Services       Precautions / Restrictions Precautions Precautions: Fall;Knee Required Braces or Orthoses: Knee Immobilizer - Left;Knee Immobilizer - Right Restrictions RLE Weight Bearing: Weight bearing as tolerated LLE Weight Bearing: Weight bearing as tolerated    Mobility  Bed Mobility Overal bed mobility: Needs Assistance Bed Mobility: Supine to Sit     Supine to sit: Min assist     General bed mobility comments: Pt shows great effort getting to EOB, wife assists minimally supporting LEs - heavy use of rails  Transfers Overall transfer level: Needs assistance Equipment used: Rolling walker (2 wheeled) Transfers: Sit to/from Stand Sit to Stand: Mod assist (very elevated surface; slow, segmented rise to standing)         General transfer comment: Pt relying heavily on UEs on front crossbar of the walker and needs constant cuing and occasional increased assist to keep weight forward and getting to full upright  Ambulation/Gait Ambulation/Gait assistance: Min guard Ambulation Distance (Feet): 85  Feet Assistive device: Rolling walker (2 wheeled)       General Gait Details: Pt's UEs begin to fatigue quickly, though he was able to try trusting LEs more and did increase ambulation confidence, speed and distance (though still slow and guarded) Pt did have an episode of L knee buckling (KI kept him from buckling too far)   Stairs            Wheelchair Mobility    Modified Rankin (Stroke Patients Only)       Balance     Sitting balance-Leahy Scale: Good       Standing balance-Leahy Scale: Fair Standing balance comment: definite need of UEs to maintain standing balance                    Cognition Arousal/Alertness: Awake/alert Behavior During Therapy: WFL for tasks assessed/performed;Impulsive Overall Cognitive Status: Within Functional Limits for tasks assessed                      Exercises Total Joint Exercises Ankle Circles/Pumps: AROM;10 reps;Both Quad Sets: Strengthening;Both;10 reps Gluteal Sets: Strengthening;Both;10 reps Short Arc Quad: AROM;Strengthening;10 reps Heel Slides: AROM;10 reps Hip ABduction/ADduction: Strengthening;10 reps Straight Leg Raises:  (did lift R a few times with UE assist, unable on L) Knee Flexion: PROM;5 reps Goniometric ROM: R = 0-83, L = 2-74    General Comments        Pertinent Vitals/Pain Pain Score: 8     Home  Living                      Prior Function            PT Goals (current goals can now be found in the care plan section) Progress towards PT goals: Progressing toward goals    Frequency    BID      PT Plan Current plan remains appropriate    Co-evaluation             End of Session Equipment Utilized During Treatment: Gait belt Activity Tolerance: Patient tolerated treatment well Patient left: with chair alarm set;with call bell/phone within reach;with family/visitor present     Time: [id number] PT Time Calculation (min) (ACUTE ONLY): 58 min  Charges:   $Gait Training: 8-22 mins $Therapeutic Exercise: 23-37 mins $Therapeutic Activity: 8-22 mins                    G Codes:      Kreg Shropshire, DPT 04/22/2016, 12:31 PM

## 2016-04-22 NOTE — Progress Notes (Signed)
   Subjective: 3 Days Post-Op Procedure(s) (LRB): TOTAL KNEE BILATERAL (Bilateral) Patient reports pain as 7 on 0-10 scale.   Patient is well, and has had no acute complaints or problems Denies any CP, SOB, ABD pain. We will continue therapy today.    Objective: Vital signs in last 24 hours: Temp:  [97.7 F (36.5 C)-98.3 F (36.8 C)] 97.7 F (36.5 C) (12/22 0734) Pulse Rate:  [80-89] 81 (12/22 0734) Resp:  [18-20] 20 (12/22 0734) BP: (105-122)/(39-68) 106/48 (12/22 0734) SpO2:  [94 %-99 %] 99 % (12/22 0734)  Intake/Output from previous day: 12/21 0701 - 12/22 0700 In: 2185 [I.V.:2185] Out: -  Intake/Output this shift: No intake/output data recorded.   Recent Labs  04/19/16 1424 04/20/16 0525 04/21/16 0441 04/22/16 0330  HGB 13.3 11.7* 9.5* 9.0*    Recent Labs  04/21/16 0441 04/22/16 0330  WBC 11.1* 11.8*  RBC 3.05* 2.93*  HCT 27.8* 26.7*  PLT 261 263    Recent Labs  04/21/16 0441 04/22/16 0330  NA 134* 134*  K 4.2 4.2  CL 99* 98*  CO2 26 26  BUN 33* 43*  CREATININE 2.50* 2.06*  GLUCOSE 147* 122*  CALCIUM 8.2* 8.5*   No results for input(s): LABPT, INR in the last 72 hours.  EXAM General - Patient is Alert, Appropriate and Oriented Extremity - Neurovascular intact Sensation intact distally Intact pulses distally Dorsiflexion/Plantar flexion intact No cellulitis present Compartment soft Dressing - dressing C/D/I and no drainage on the left, right knee with scant drainage to the distal incision site. Motor Function - intact, moving foot and toes well on exam.   Past Medical History:  Diagnosis Date  . Anxiety   . Arthritis    knees, hands  . Bipolar disorder ([place])   . Chronic kidney disease    arf 2014 due to dehydration  . Headache   . Hemorrhoids   . Hyperlipidemia   . Hypertension   . LFT elevation   . NAFLD (nonalcoholic fatty liver disease)     Assessment/Plan:   3 Days Post-Op Procedure(s) (LRB): TOTAL KNEE BILATERAL  (Bilateral) Active Problems:   Bilateral primary osteoarthritis of knee   Acute post op blood loss anemia    Acute renal insufficiency  Estimated body mass index is 39.75 kg/m as calculated from the following:   Height as of this encounter: 6\' 3"  (1.905 m).   Weight as of this encounter: 144.2 kg (318 lb). Advance diet Up with therapy  Discharge to skilled nursing facility today pending bowel movement. Acute post op blood loss anemia -hemoglobin stable 9.0 Acute renal insufficiency - improving, down to 2.06. Continue to hold lisinopril.    DVT Prophylaxis - Lovenox, Foot Pumps and TED hose Weight-Bearing as tolerated to left and right leg   T. Rachelle Hora, PA-C [place] 04/22/2016, 8:22 AM

## 2016-04-22 NOTE — Progress Notes (Signed)
Patient is medically stable for D/C to [facility] today. Per [facility] liaison patient can go to room 606. RN will call report to RN Yaakov Guthrie at [phone] and arrange EMS for transport. Clinical Education officer, museum (CSW) sent D/C orders to [facility] via [facility]. Patient is aware of above and his wife Levada Dy is at bedside and aware of above. Please reconsult if future social work needs arise. CSW signing off.   [employer], LCSW [phone]

## 2016-04-22 NOTE — Progress Notes (Signed)
Pt being discharged to [facility] today. Report called to Romie Minus, all questions answered. PIV removed. Discharge instructions included in discharge packet along with all needed prescriptions. He is aware and in agreement of plan. He is leaving with all his belongings, will be transported via EMS.

## 2016-04-22 NOTE — Discharge Summary (Signed)
Physician Discharge Summary  Patient ID: Nathan Chambers MRN: [id number] DOB/AGE: 52-10-1965 52 y.o.  Admit date: 04/19/2016 Discharge date: 04/22/2016  Admission Diagnoses:  [place] PAIN,LEFT KNEE PAIN   Discharge Diagnoses: Patient Active Problem List   Diagnosis Date Noted  . Bilateral primary osteoarthritis of knee 04/19/2016    Past Medical History:  Diagnosis Date  . Anxiety   . Arthritis    knees, hands  . Bipolar disorder ([place])   . Chronic kidney disease    arf 2014 due to dehydration  . Headache   . Hemorrhoids   . Hyperlipidemia   . Hypertension   . LFT elevation   . NAFLD (nonalcoholic fatty liver disease)      Transfusion: none   Consultants (if any):   Discharged Condition: Improved  Hospital Course: TRUMAN TUDISCO is an 50 y.o. male who was admitted 04/19/2016 with a diagnosis of Left and right knee osteoarthritis and went to the operating room on 04/19/2016 and underwent the above named procedures.    Surgeries: Procedure(s): TOTAL KNEE BILATERAL on 04/19/2016 Patient tolerated the surgery well. Taken to PACU where she was stabilized and then transferred to the orthopedic floor.  Started on Lovenox 30 q 12 hrs. Foot pumps applied bilaterally at 80 mm. Heels elevated on bed with rolled towels. No evidence of DVT. Negative Homan. Physical therapy started on day #1 for gait training and transfer. OT started day #1 for ADL and assisted devices.  Patient's foley was d/c on day #1. Patient's IV was d/c on day #2. On postop day 2, patient was found to have acute postoperative blood loss anemia along with acute renal insufficiency. Patient was started on iron supplements, lisinopril was held, morphine PCA was discontinued. Patient's blood pressure did improve.   On post op day #3 patient had improved creatinine to 2.06, hemoglobin was stable. Blood pressure was improved. Patient is stable and ready for discharge to Skilled  nursing facility.  Implants: Medacta GMK sphere system 5+ right and left femoral components 5 tibia right and left with a 10 mm inserts bilaterally and 4 patellas all components cemented with short stems  He was given perioperative antibiotics:  Anti-infectives    Start     Dose/Rate Route Frequency Ordered Stop   04/19/16 1600  ceFAZolin (ANCEF) IVPB 2g/100 mL premix     2 g 200 mL/hr over 30 Minutes Intravenous Every 6 hours 04/19/16 1345 04/20/16 0445   04/19/16 0245  ceFAZolin (ANCEF) 3 g in dextrose 5 % 50 mL IVPB     3 g 130 mL/hr over 30 Minutes Intravenous  Once 04/19/16 0233 04/19/16 1028   04/19/16 0015  ceFAZolin (ANCEF) IVPB 2g/100 mL premix  Status:  Discontinued     2 g 200 mL/hr over 30 Minutes Intravenous  Once 04/19/16 0004 04/19/16 0005    .  He was given sequential compression devices, early ambulation, and Lovenox for DVT prophylaxis.  He benefited maximally from the hospital stay and there were no complications.    Recent vital signs:  Vitals:   04/22/16 0409 04/22/16 0734  BP: (!) 106/43 (!) 106/48  Pulse: 80 81  Resp: 18 20  Temp: 98.2 F (36.8 C) 97.7 F (36.5 C)    Recent laboratory studies:  Lab Results  Component Value Date   HGB 9.0 (L) 04/22/2016   HGB 9.5 (L) 04/21/2016   HGB 11.7 (L) 04/20/2016   Lab Results  Component Value Date   WBC 11.8 (  H) 04/22/2016   PLT 263 04/22/2016   Lab Results  Component Value Date   INR 1.06 04/06/2016   Lab Results  Component Value Date   NA 134 (L) 04/22/2016   K 4.2 04/22/2016   CL 98 (L) 04/22/2016   CO2 26 04/22/2016   BUN 43 (H) 04/22/2016   CREATININE 2.06 (H) 04/22/2016   GLUCOSE 122 (H) 04/22/2016    Discharge Medications:   Allergies as of 04/22/2016      Reactions   Etodolac Itching      Medication List    STOP taking these medications   amLODipine-benazepril 10-40 MG capsule Commonly known as:  LOTREL     TAKE these medications   ALPRAZolam 0.5 MG tablet Commonly known  as:  XANAX Take 0.5 mg by mouth every evening.   amLODipine 10 MG tablet Commonly known as:  NORVASC Take 1 tablet (10 mg total) by mouth daily.   aspirin 81 MG tablet Take 81 mg by mouth daily.   chlorthalidone 25 MG tablet Commonly known as:  HYGROTON Take 12.5 mg by mouth daily.   enoxaparin 40 MG/0.4ML injection Commonly known as:  LOVENOX Inject 0.4 mLs (40 mg total) into the skin daily.   folic acid [id number] MCG tablet Commonly known as:  FOLVITE Take 400 mcg by mouth daily.   gemfibrozil 600 MG tablet Commonly known as:  LOPID Take 600 mg by mouth 2 (two) times daily before a meal.   Lutein 6 MG Tabs Take 6 mg by mouth daily.   multivitamin tablet Take 1 tablet by mouth daily.   multivitamin-lutein Caps capsule Take 1 capsule by mouth daily.   naloxone 0.4 MG/ML injection Commonly known as:  NARCAN Inject 1 mL (0.4 mg total) into the vein as needed (respiratory rate < 10).   oxyCODONE 15 mg 12 hr tablet Commonly known as:  OXYCONTIN Take 1 tablet (15 mg total) by mouth every 12 (twelve) hours.   oxyCODONE 5 MG immediate release tablet Commonly known as:  Oxy IR/ROXICODONE Take 1-2 tablets (5-10 mg total) by mouth every 3 (three) hours as needed for breakthrough pain.   oxyCODONE-acetaminophen 5-325 MG tablet Commonly known as:  PERCOCET/ROXICET Take 1 tablet by mouth every 6 (six) hours as needed for pain.   PARoxetine 20 MG tablet Commonly known as:  PAXIL Take 20 mg by mouth 2 (two) times daily.   pravastatin 20 MG tablet Commonly known as:  PRAVACHOL Take 20 mg by mouth every evening.   vitamin B-12 500 MCG tablet Commonly known as:  CYANOCOBALAMIN Take 500 mcg by mouth daily.   vitamin C 500 MG tablet Commonly known as:  ASCORBIC ACID Take 500 mg by mouth daily.            Durable Medical Equipment        Start     Ordered   04/19/16 1346  DME Walker rolling  Once    Question:  Patient needs a walker to treat with the following  condition  Answer:  Status post total knee replacement using cement, bilateral   04/19/16 1345   04/19/16 1346  DME 3 n 1  Once     04/19/16 1345   04/19/16 1346  DME Bedside commode  Once    Question:  Patient needs a bedside commode to treat with the following condition  Answer:  Status post total knee replacement, bilateral   04/19/16 1345      Diagnostic Studies: Dg Knee 1-2 Views  Left  Result Date: 04/19/2016 CLINICAL DATA:  Status post total knee replacement EXAM: LEFT KNEE - 1-2 VIEW COMPARISON:  Left knee CT March 03, 2016 FINDINGS: Frontal and lateral views were obtained it. The patient is status post total knee arthroplasty with femoral and tibial prosthetic components appearing well seated. No acute fracture or dislocation evident. Air within the joint is an expected postoperative finding. There is spurring along the patella. IMPRESSION: Prosthetic components appear well seated. No acute fracture or dislocation. Arthropathy with spurring in the patellar region. Electronically Signed   By: Lowella Grip III M.D.   On: 04/19/2016 13:24   Dg Knee 1-2 Views Right  Result Date: 04/19/2016 CLINICAL DATA:  Status post total knee replacement EXAM: RIGHT KNEE - 1-2 VIEW COMPARISON:  Right knee CT March 03, 2016 FINDINGS: Frontal and lateral views were obtained. The patient is status post total knee arthroplasty with femoral and tibial prosthetic components appearing well-seated. No acute fracture or dislocation. Air within the joint is an expected postoperative finding. There is spurring along the mediolateral aspects of the distal femur and proximal tibia. IMPRESSION: Total knee replacement prosthetic components appear well seated. No acute fracture or dislocation. Electronically Signed   By: Lowella Grip III M.D.   On: 04/19/2016 13:25    Disposition: 01-Home or Self Care     Contact information for follow-up providers    MENZ,MICHAEL, MD Follow up in 2 week(s).    Specialty:  Orthopedic Surgery Why:  for wound check, staple removal and Steri-Strip application Contact information: [place] [id number] [phone]            Contact information for after-discharge care    Destination    [facility] Follow up.   Specialty:  [place] information: [address] [place] [phone]                   Signed: [place] 04/22/2016, 8:28 AM

## 2016-05-05 DIAGNOSIS — I10 Essential (primary) hypertension: Secondary | ICD-10-CM | POA: Diagnosis not present

## 2016-05-05 DIAGNOSIS — Z96653 Presence of artificial knee joint, bilateral: Secondary | ICD-10-CM | POA: Diagnosis not present

## 2016-05-05 DIAGNOSIS — K5900 Constipation, unspecified: Secondary | ICD-10-CM | POA: Diagnosis not present

## 2016-05-07 DIAGNOSIS — Z96653 Presence of artificial knee joint, bilateral: Secondary | ICD-10-CM | POA: Diagnosis not present

## 2016-05-07 DIAGNOSIS — K5900 Constipation, unspecified: Secondary | ICD-10-CM | POA: Diagnosis not present

## 2016-05-07 DIAGNOSIS — I10 Essential (primary) hypertension: Secondary | ICD-10-CM | POA: Diagnosis not present

## 2016-05-09 DIAGNOSIS — I10 Essential (primary) hypertension: Secondary | ICD-10-CM | POA: Diagnosis not present

## 2016-05-10 DIAGNOSIS — M25562 Pain in left knee: Secondary | ICD-10-CM | POA: Diagnosis not present

## 2016-05-10 DIAGNOSIS — M25561 Pain in right knee: Secondary | ICD-10-CM | POA: Diagnosis not present

## 2016-05-12 DIAGNOSIS — M25561 Pain in right knee: Secondary | ICD-10-CM | POA: Diagnosis not present

## 2016-05-12 DIAGNOSIS — M25562 Pain in left knee: Secondary | ICD-10-CM | POA: Diagnosis not present

## 2016-05-16 DIAGNOSIS — M25562 Pain in left knee: Secondary | ICD-10-CM | POA: Diagnosis not present

## 2016-05-16 DIAGNOSIS — M25561 Pain in right knee: Secondary | ICD-10-CM | POA: Diagnosis not present

## 2016-05-23 DIAGNOSIS — M170 Bilateral primary osteoarthritis of knee: Secondary | ICD-10-CM | POA: Diagnosis not present

## 2016-05-23 DIAGNOSIS — R739 Hyperglycemia, unspecified: Secondary | ICD-10-CM | POA: Diagnosis not present

## 2016-05-23 DIAGNOSIS — M25562 Pain in left knee: Secondary | ICD-10-CM | POA: Diagnosis not present

## 2016-05-23 DIAGNOSIS — I10 Essential (primary) hypertension: Secondary | ICD-10-CM | POA: Diagnosis not present

## 2016-05-23 DIAGNOSIS — M25561 Pain in right knee: Secondary | ICD-10-CM | POA: Diagnosis not present

## 2016-05-25 DIAGNOSIS — M25662 Stiffness of left knee, not elsewhere classified: Secondary | ICD-10-CM | POA: Diagnosis not present

## 2016-05-25 DIAGNOSIS — M25661 Stiffness of right knee, not elsewhere classified: Secondary | ICD-10-CM | POA: Diagnosis not present

## 2016-05-30 DIAGNOSIS — M25661 Stiffness of right knee, not elsewhere classified: Secondary | ICD-10-CM | POA: Diagnosis not present

## 2016-05-30 DIAGNOSIS — M25662 Stiffness of left knee, not elsewhere classified: Secondary | ICD-10-CM | POA: Diagnosis not present

## 2016-06-01 DIAGNOSIS — M25661 Stiffness of right knee, not elsewhere classified: Secondary | ICD-10-CM | POA: Diagnosis not present

## 2016-06-01 DIAGNOSIS — Z96653 Presence of artificial knee joint, bilateral: Secondary | ICD-10-CM | POA: Diagnosis not present

## 2016-06-01 DIAGNOSIS — M25662 Stiffness of left knee, not elsewhere classified: Secondary | ICD-10-CM | POA: Diagnosis not present

## 2016-06-03 DIAGNOSIS — M25662 Stiffness of left knee, not elsewhere classified: Secondary | ICD-10-CM | POA: Diagnosis not present

## 2016-06-03 DIAGNOSIS — M25661 Stiffness of right knee, not elsewhere classified: Secondary | ICD-10-CM | POA: Diagnosis not present

## 2016-06-06 DIAGNOSIS — M25662 Stiffness of left knee, not elsewhere classified: Secondary | ICD-10-CM | POA: Diagnosis not present

## 2016-06-06 DIAGNOSIS — M25661 Stiffness of right knee, not elsewhere classified: Secondary | ICD-10-CM | POA: Diagnosis not present

## 2016-06-08 DIAGNOSIS — M25662 Stiffness of left knee, not elsewhere classified: Secondary | ICD-10-CM | POA: Diagnosis not present

## 2016-06-08 DIAGNOSIS — M25661 Stiffness of right knee, not elsewhere classified: Secondary | ICD-10-CM | POA: Diagnosis not present

## 2016-06-10 DIAGNOSIS — M25662 Stiffness of left knee, not elsewhere classified: Secondary | ICD-10-CM | POA: Diagnosis not present

## 2016-06-10 DIAGNOSIS — M25661 Stiffness of right knee, not elsewhere classified: Secondary | ICD-10-CM | POA: Diagnosis not present

## 2016-06-13 DIAGNOSIS — M25662 Stiffness of left knee, not elsewhere classified: Secondary | ICD-10-CM | POA: Diagnosis not present

## 2016-06-13 DIAGNOSIS — M25661 Stiffness of right knee, not elsewhere classified: Secondary | ICD-10-CM | POA: Diagnosis not present

## 2016-06-15 DIAGNOSIS — M25662 Stiffness of left knee, not elsewhere classified: Secondary | ICD-10-CM | POA: Diagnosis not present

## 2016-06-15 DIAGNOSIS — M25661 Stiffness of right knee, not elsewhere classified: Secondary | ICD-10-CM | POA: Diagnosis not present

## 2016-06-17 DIAGNOSIS — M25662 Stiffness of left knee, not elsewhere classified: Secondary | ICD-10-CM | POA: Diagnosis not present

## 2016-06-17 DIAGNOSIS — M25661 Stiffness of right knee, not elsewhere classified: Secondary | ICD-10-CM | POA: Diagnosis not present

## 2016-06-20 DIAGNOSIS — M25661 Stiffness of right knee, not elsewhere classified: Secondary | ICD-10-CM | POA: Diagnosis not present

## 2016-06-20 DIAGNOSIS — M25662 Stiffness of left knee, not elsewhere classified: Secondary | ICD-10-CM | POA: Diagnosis not present

## 2016-06-22 DIAGNOSIS — M25661 Stiffness of right knee, not elsewhere classified: Secondary | ICD-10-CM | POA: Diagnosis not present

## 2016-06-22 DIAGNOSIS — M25662 Stiffness of left knee, not elsewhere classified: Secondary | ICD-10-CM | POA: Diagnosis not present

## 2016-06-24 DIAGNOSIS — M25662 Stiffness of left knee, not elsewhere classified: Secondary | ICD-10-CM | POA: Diagnosis not present

## 2016-06-24 DIAGNOSIS — M25661 Stiffness of right knee, not elsewhere classified: Secondary | ICD-10-CM | POA: Diagnosis not present

## 2016-06-27 DIAGNOSIS — M25661 Stiffness of right knee, not elsewhere classified: Secondary | ICD-10-CM | POA: Diagnosis not present

## 2016-06-27 DIAGNOSIS — M25662 Stiffness of left knee, not elsewhere classified: Secondary | ICD-10-CM | POA: Diagnosis not present

## 2016-06-29 DIAGNOSIS — M25662 Stiffness of left knee, not elsewhere classified: Secondary | ICD-10-CM | POA: Diagnosis not present

## 2016-06-29 DIAGNOSIS — M25661 Stiffness of right knee, not elsewhere classified: Secondary | ICD-10-CM | POA: Diagnosis not present

## 2016-08-10 DIAGNOSIS — Z96653 Presence of artificial knee joint, bilateral: Secondary | ICD-10-CM | POA: Diagnosis not present

## 2016-09-30 DIAGNOSIS — Z0000 Encounter for general adult medical examination without abnormal findings: Secondary | ICD-10-CM | POA: Diagnosis not present

## 2016-09-30 DIAGNOSIS — R739 Hyperglycemia, unspecified: Secondary | ICD-10-CM | POA: Diagnosis not present

## 2016-10-07 DIAGNOSIS — I10 Essential (primary) hypertension: Secondary | ICD-10-CM | POA: Diagnosis not present

## 2016-10-07 DIAGNOSIS — R739 Hyperglycemia, unspecified: Secondary | ICD-10-CM | POA: Diagnosis not present

## 2016-10-07 DIAGNOSIS — E785 Hyperlipidemia, unspecified: Secondary | ICD-10-CM | POA: Diagnosis not present

## 2017-01-18 DIAGNOSIS — R7302 Impaired glucose tolerance (oral): Secondary | ICD-10-CM | POA: Diagnosis not present

## 2017-02-06 DIAGNOSIS — Z86010 Personal history of colonic polyps: Secondary | ICD-10-CM | POA: Diagnosis not present

## 2017-03-06 DIAGNOSIS — R209 Unspecified disturbances of skin sensation: Secondary | ICD-10-CM | POA: Diagnosis not present

## 2017-03-06 DIAGNOSIS — I10 Essential (primary) hypertension: Secondary | ICD-10-CM | POA: Diagnosis not present

## 2017-03-29 DIAGNOSIS — R209 Unspecified disturbances of skin sensation: Secondary | ICD-10-CM | POA: Diagnosis not present

## 2017-04-07 ENCOUNTER — Ambulatory Visit
Admission: RE | Admit: 2017-04-07 | Discharge: 2017-04-07 | Disposition: A | Discharge status: Home / Self Care | Payer: 59 | Source: Ambulatory Visit | Attending: Internal Medicine | Admitting: Internal Medicine

## 2017-04-07 ENCOUNTER — Ambulatory Visit: Payer: 59 | Admitting: Anesthesiology

## 2017-04-07 ENCOUNTER — Encounter
Admission: RE | Disposition: A | Discharge status: Home / Self Care | Payer: Self-pay | Source: Ambulatory Visit | Attending: Internal Medicine

## 2017-04-07 ENCOUNTER — Encounter: Payer: Self-pay | Admitting: Anesthesiology

## 2017-04-07 DIAGNOSIS — E669 Obesity, unspecified: Secondary | ICD-10-CM | POA: Insufficient documentation

## 2017-04-07 DIAGNOSIS — E785 Hyperlipidemia, unspecified: Secondary | ICD-10-CM | POA: Insufficient documentation

## 2017-04-07 DIAGNOSIS — Z6839 Body mass index (BMI) 39.0-39.9, adult: Secondary | ICD-10-CM | POA: Diagnosis not present

## 2017-04-07 DIAGNOSIS — F419 Anxiety disorder, unspecified: Secondary | ICD-10-CM | POA: Diagnosis not present

## 2017-04-07 DIAGNOSIS — I10 Essential (primary) hypertension: Secondary | ICD-10-CM | POA: Diagnosis not present

## 2017-04-07 DIAGNOSIS — N189 Chronic kidney disease, unspecified: Secondary | ICD-10-CM | POA: Insufficient documentation

## 2017-04-07 DIAGNOSIS — K640 First degree hemorrhoids: Secondary | ICD-10-CM | POA: Diagnosis not present

## 2017-04-07 DIAGNOSIS — Z79899 Other long term (current) drug therapy: Secondary | ICD-10-CM | POA: Insufficient documentation

## 2017-04-07 DIAGNOSIS — Z7982 Long term (current) use of aspirin: Secondary | ICD-10-CM | POA: Diagnosis not present

## 2017-04-07 DIAGNOSIS — Z1211 Encounter for screening for malignant neoplasm of colon: Secondary | ICD-10-CM | POA: Diagnosis not present

## 2017-04-07 DIAGNOSIS — M19042 Primary osteoarthritis, left hand: Secondary | ICD-10-CM | POA: Insufficient documentation

## 2017-04-07 DIAGNOSIS — I129 Hypertensive chronic kidney disease with stage 1 through stage 4 chronic kidney disease, or unspecified chronic kidney disease: Secondary | ICD-10-CM | POA: Insufficient documentation

## 2017-04-07 DIAGNOSIS — M170 Bilateral primary osteoarthritis of knee: Secondary | ICD-10-CM | POA: Insufficient documentation

## 2017-04-07 DIAGNOSIS — F319 Bipolar disorder, unspecified: Secondary | ICD-10-CM | POA: Diagnosis not present

## 2017-04-07 DIAGNOSIS — Z86010 Personal history of colonic polyps: Secondary | ICD-10-CM | POA: Insufficient documentation

## 2017-04-07 DIAGNOSIS — K648 Other hemorrhoids: Secondary | ICD-10-CM | POA: Diagnosis not present

## 2017-04-07 DIAGNOSIS — M19041 Primary osteoarthritis, right hand: Secondary | ICD-10-CM | POA: Diagnosis not present

## 2017-04-07 DIAGNOSIS — G4730 Sleep apnea, unspecified: Secondary | ICD-10-CM | POA: Diagnosis not present

## 2017-04-07 HISTORY — DX: Sleep apnea, unspecified: G47.30

## 2017-04-07 HISTORY — PX: COLONOSCOPY WITH PROPOFOL: SHX5780

## 2017-04-07 SURGERY — COLONOSCOPY WITH PROPOFOL
Anesthesia: General

## 2017-04-07 MED ORDER — PROPOFOL 500 MG/50ML IV EMUL
INTRAVENOUS | Status: AC
  Filled 2017-04-07: qty 50

## 2017-04-07 MED ORDER — LIDOCAINE HCL (PF) 2 % IJ SOLN
INTRAMUSCULAR | Status: AC
  Filled 2017-04-07: qty 10

## 2017-04-07 MED ORDER — PROPOFOL 500 MG/50ML IV EMUL
INTRAVENOUS | Status: DC | PRN
  Administered 2017-04-07: 120 ug/kg/min via INTRAVENOUS

## 2017-04-07 MED ORDER — PHENYLEPHRINE HCL 10 MG/ML IJ SOLN
INTRAMUSCULAR | Status: DC | PRN
  Administered 2017-04-07: 50 ug via INTRAVENOUS

## 2017-04-07 MED ORDER — MIDAZOLAM HCL 2 MG/2ML IJ SOLN
INTRAMUSCULAR | Status: DC | PRN
  Administered 2017-04-07 (×2): 1 mg via INTRAVENOUS

## 2017-04-07 MED ORDER — LIDOCAINE HCL (CARDIAC) 20 MG/ML IV SOLN
INTRAVENOUS | Status: DC | PRN
  Administered 2017-04-07: 60 mg via INTRAVENOUS

## 2017-04-07 MED ORDER — MIDAZOLAM HCL 2 MG/2ML IJ SOLN
INTRAMUSCULAR | Status: AC
  Filled 2017-04-07: qty 2

## 2017-04-07 MED ORDER — PROPOFOL 10 MG/ML IV BOLUS
INTRAVENOUS | Status: DC | PRN
  Administered 2017-04-07: 20 mg via INTRAVENOUS
  Administered 2017-04-07: 80 mg via INTRAVENOUS

## 2017-04-07 MED ORDER — SODIUM CHLORIDE 0.9 % IV SOLN
INTRAVENOUS | Status: DC
  Administered 2017-04-07: 14:00:00 via INTRAVENOUS

## 2017-04-07 NOTE — Anesthesia Post-op Follow-up Note (Signed)
Anesthesia QCDR form completed.        

## 2017-04-07 NOTE — Anesthesia Postprocedure Evaluation (Signed)
Anesthesia Post Note  Patient: Nathan Chambers  Procedure(s) Performed: COLONOSCOPY WITH PROPOFOL (N/A )  Patient location during evaluation: Endoscopy Anesthesia Type: General Level of consciousness: awake and alert Pain management: pain level controlled Vital Signs Assessment: post-procedure vital signs reviewed and stable Respiratory status: spontaneous breathing, nonlabored ventilation, respiratory function stable and patient connected to nasal cannula oxygen Cardiovascular status: blood pressure returned to baseline and stable Postop Assessment: no apparent nausea or vomiting Anesthetic complications: no     Last Vitals:  Vitals:   04/07/17 1447 04/07/17 1457  BP: 103/62 111/61  Pulse: 74 68  Resp: 18 13  Temp: (!) 36.2 C   SpO2: 94% 95%    Last Pain:  Vitals:   04/07/17 1447  TempSrc: Tympanic  PainSc:                  Shyia Fillingim S

## 2017-04-07 NOTE — H&P (Signed)
Outpatient short stay form Pre-procedure 04/07/2017 4:16 PM Teodoro K. Alice Reichert, M.D.  Primary Physician: Juluis Pitch.  Reason for visit:  Personal hx of colon polyps.  History of present illness:  Patient presents for colonoscopy for screening/surveillance. The patient denies complaints of abdominal pain, significant change in bowel habits, or rectal bleeding.     Current Facility-Administered Medications:  .  0.9 %  sodium chloride infusion, , Intravenous, Continuous, Rutherford, Benay Pike, MD, Last Rate: 20 mL/hr at 04/07/17 1349  Current Outpatient Medications:  .  amLODipine (NORVASC) 10 MG tablet, Take 1 tablet (10 mg total) by mouth daily., Disp: 30 tablet, Rfl: 0 .  aspirin 81 MG tablet, Take 81 mg by mouth daily., Disp: , Rfl:  .  folic acid (FOLVITE) 161 MCG tablet, Take 400 mcg by mouth daily., Disp: , Rfl:  .  gemfibrozil (LOPID) 600 MG tablet, Take 600 mg by mouth 2 (two) times daily before a meal., Disp: , Rfl:  .  Lutein 6 MG TABS, Take 6 mg by mouth daily., Disp: , Rfl:  .  Multiple Vitamin (MULTIVITAMIN) tablet, Take 1 tablet by mouth daily., Disp: , Rfl:  .  multivitamin-lutein (OCUVITE-LUTEIN) CAPS capsule, Take 1 capsule by mouth daily., Disp: , Rfl:  .  oxyCODONE-acetaminophen (PERCOCET/ROXICET) 5-325 MG tablet, Take 1 tablet by mouth every 6 (six) hours as needed for pain., Disp: , Rfl:  .  PARoxetine (PAXIL) 20 MG tablet, Take 20 mg by mouth 2 (two) times daily., Disp: , Rfl:  .  pravastatin (PRAVACHOL) 20 MG tablet, Take 20 mg by mouth every evening. , Disp: , Rfl:  .  vitamin B-12 (CYANOCOBALAMIN) 500 MCG tablet, Take 500 mcg by mouth daily., Disp: , Rfl:  .  vitamin C (ASCORBIC ACID) 500 MG tablet, Take 500 mg by mouth daily., Disp: , Rfl:  .  ALPRAZolam (XANAX) 0.5 MG tablet, Take 0.5 mg by mouth every evening. , Disp: , Rfl:  .  chlorthalidone (HYGROTON) 25 MG tablet, Take 12.5 mg by mouth daily., Disp: , Rfl:  .  enoxaparin (LOVENOX) 40 MG/0.4ML injection,  Inject 0.4 mLs (40 mg total) into the skin daily. (Patient not taking: Reported on 04/07/2017), Disp: 14 Syringe, Rfl: 0 .  naloxone (NARCAN) 0.4 MG/ML injection, Inject 1 mL (0.4 mg total) into the vein as needed (respiratory rate < 10). (Patient not taking: Reported on 04/07/2017), Disp: 1 mL, Rfl: 0 .  oxyCODONE (OXY IR/ROXICODONE) 5 MG immediate release tablet, Take 1-2 tablets (5-10 mg total) by mouth every 3 (three) hours as needed for breakthrough pain. (Patient not taking: Reported on 04/07/2017), Disp: 30 tablet, Rfl: 0  No medications prior to admission.     Allergies  Allergen Reactions  . Etodolac Itching     Past Medical History:  Diagnosis Date  . Anxiety   . Arthritis    knees, hands  . Bipolar disorder ([place])   . Chronic kidney disease    arf 2014 due to dehydration  . Headache   . Hemorrhoids   . Hyperlipidemia   . Hypertension   . LFT elevation   . NAFLD (nonalcoholic fatty liver disease)   . Sleep apnea     Review of systems:      Physical Exam  General appearance: alert, cooperative and appears stated age Resp: clear to auscultation bilaterally Cardio: regular rate and rhythm, S1, S2 normal, no murmur, click, rub or gallop GI: soft, non-tender; bowel sounds normal; no masses,  no organomegaly Extremities: extremities normal, atraumatic,  no cyanosis or edema     Planned procedures: Colonoscopy. The patient understands the nature of the planned procedure, indications, risks, alternatives and potential complications including but not limited to bleeding, infection, perforation, damage to internal organs and possible oversedation/side effects from anesthesia. The patient agrees and gives consent to proceed.  Please refer to procedure notes for findings, recommendations and patient disposition/instructions.    Teodoro K. Alice Reichert, M.D. Gastroenterology 04/07/2017  4:16 PM

## 2017-04-07 NOTE — Transfer of Care (Signed)
Immediate Anesthesia Transfer of Care Note  Patient: Nathan Chambers  Procedure(s) Performed: COLONOSCOPY WITH PROPOFOL (N/A )  Patient Location: PACU  Anesthesia Type:General  Level of Consciousness: sedated  Airway & Oxygen Therapy: Patient Spontanous Breathing and Patient connected to nasal cannula oxygen  Post-op Assessment: Report given to RN and Post -op Vital signs reviewed and stable  Post vital signs: Reviewed and stable  Last Vitals:  Vitals:   04/07/17 1336 04/07/17 1447  BP: (!) 158/76   Pulse: 73 74  Resp: 20 18  Temp: (!) 36.2 C (!) 36.2 C  SpO2: 98% 94%    Last Pain:  Vitals:   04/07/17 1447  TempSrc: Tympanic  PainSc:       Patients Stated Pain Goal: 4 (84/83/50 [id number])  Complications: No apparent anesthesia complications

## 2017-04-07 NOTE — Anesthesia Preprocedure Evaluation (Addendum)
Anesthesia Evaluation  Patient identified by MRN, date of birth, ID band Patient awake    Reviewed: Allergy & Precautions, NPO status , Patient's Chart, lab work & pertinent test results, reviewed documented beta blocker date and time   Airway Mallampati: III  TM Distance: >3 FB     Dental  (+) Chipped   Pulmonary           Cardiovascular hypertension, Pt. on medications      Neuro/Psych  Headaches, PSYCHIATRIC DISORDERS Anxiety Bipolar Disorder    GI/Hepatic   Endo/Other    Renal/GU Renal disease     Musculoskeletal  (+) Arthritis ,   Abdominal   Peds  Hematology   Anesthesia Other Findings Obese.  Reproductive/Obstetrics                            Anesthesia Physical Anesthesia Plan  ASA: III  Anesthesia Plan: General   Post-op Pain Management:    Induction: Intravenous  PONV Risk Score and Plan:   Airway Management Planned:   Additional Equipment:   Intra-op Plan:   Post-operative Plan:   Informed Consent: I have reviewed the patients History and Physical, chart, labs and discussed the procedure including the risks, benefits and alternatives for the proposed anesthesia with the patient or authorized representative who has indicated his/her understanding and acceptance.     Plan Discussed with: CRNA  Anesthesia Plan Comments:        Anesthesia Quick Evaluation

## 2017-04-07 NOTE — Op Note (Signed)
[facility] Gastroenterology Patient Name: Nathan Chambers Procedure Date: 04/07/2017 2:21 PM MRN: [id number] Account #: [id number] Date of Birth: 01-13-1966 Admit Type: Outpatient Age: 51 Room: [facility] ENDO ROOM 3 Gender: Male Note Status: Finalized Procedure:            Colonoscopy Indications:          Surveillance: Personal history of adenomatous polyps on                        last colonoscopy > 3 years ago Providers:            Lorie Apley K. Alice Reichert MD, MD Referring MD:         Youlanda Roys. Lovie Macadamia, MD (Referring MD) Medicines:            Propofol per Anesthesia Complications:        No immediate complications. Procedure:            Pre-Anesthesia Assessment:                       - The risks and benefits of the procedure and the                        sedation options and risks were discussed with the                        patient. All questions were answered and informed                        consent was obtained.                       - Patient identification and proposed procedure were                        verified prior to the procedure by the nurse. The                        procedure was verified in the procedure room.                       - ASA Grade Assessment: III - A patient with severe                        systemic disease.                       - After reviewing the risks and benefits, the patient                        was deemed in satisfactory condition to undergo the                        procedure.                       After obtaining informed consent, the colonoscope was                        passed under direct vision. Throughout the procedure,  the patient's blood pressure, pulse, and oxygen                        saturations were monitored continuously. The                        Colonoscope was introduced through the anus and                        advanced to the the cecum, identified by appendiceal               orifice and ileocecal valve. The colonoscopy was                        performed without difficulty. The patient tolerated the                        procedure well. The quality of the bowel preparation                        was adequate to identify polyps. The ileocecal valve,                        appendiceal orifice, and rectum were photographed. Findings:      The perianal and digital rectal examinations were normal. Pertinent       negatives include normal sphincter tone and no palpable rectal lesions.      Non-bleeding internal hemorrhoids were found during retroflexion. The       hemorrhoids were Grade I (internal hemorrhoids that do not prolapse).      The exam was otherwise without abnormality on direct and retroflexion       views. Impression:           - Non-bleeding internal hemorrhoids.                       - The examination was otherwise normal on direct and                        retroflexion views.                       - No specimens collected. Recommendation:       - Patient has a contact number available for                        emergencies. The signs and symptoms of potential                        delayed complications were discussed with the patient.                        Return to normal activities tomorrow. Written discharge                        instructions were provided to the patient.                       - Resume previous diet.                       -  Continue present medications.                       - Repeat colonoscopy in 5 years for surveillance.                       - The findings and recommendations were discussed with                        the patient and their family. Procedure Code(s):    --- Professional ---                       P2244, Colorectal cancer screening; colonoscopy on                        individual at high risk Diagnosis Code(s):    --- Professional ---                       K64.0, First degree hemorrhoids                        Z86.010, Personal history of colonic polyps CPT copyright 2016 American [employer] Association. All rights reserved. The codes documented in this report are preliminary and upon coder review may  be revised to meet current compliance requirements. Efrain Sella MD, MD 04/07/2017 2:44:22 PM This report has been signed electronically. Number of Addenda: 0 Note Initiated On: 04/07/2017 2:21 PM Scope Withdrawal Time: 0 hours 8 minutes 37 seconds  Total Procedure Duration: 0 hours 13 minutes 7 seconds       [facility]

## 2017-04-10 ENCOUNTER — Encounter: Payer: Self-pay | Admitting: Internal Medicine

## 2017-04-12 DIAGNOSIS — Z96653 Presence of artificial knee joint, bilateral: Secondary | ICD-10-CM | POA: Diagnosis not present

## 2017-05-25 DIAGNOSIS — R202 Paresthesia of skin: Secondary | ICD-10-CM | POA: Diagnosis not present

## 2017-05-25 DIAGNOSIS — R200 Anesthesia of skin: Secondary | ICD-10-CM | POA: Diagnosis not present

## 2017-05-29 DIAGNOSIS — R200 Anesthesia of skin: Secondary | ICD-10-CM | POA: Diagnosis not present

## 2017-05-29 DIAGNOSIS — R202 Paresthesia of skin: Secondary | ICD-10-CM | POA: Diagnosis not present

## 2017-06-07 DIAGNOSIS — G5621 Lesion of ulnar nerve, right upper limb: Secondary | ICD-10-CM | POA: Diagnosis not present

## 2017-06-07 DIAGNOSIS — G5603 Carpal tunnel syndrome, bilateral upper limbs: Secondary | ICD-10-CM | POA: Diagnosis not present

## 2017-06-21 DIAGNOSIS — G5603 Carpal tunnel syndrome, bilateral upper limbs: Secondary | ICD-10-CM | POA: Diagnosis not present

## 2017-06-29 DIAGNOSIS — G5603 Carpal tunnel syndrome, bilateral upper limbs: Secondary | ICD-10-CM | POA: Diagnosis not present

## 2017-08-25 DIAGNOSIS — G5603 Carpal tunnel syndrome, bilateral upper limbs: Secondary | ICD-10-CM | POA: Diagnosis not present

## 2017-08-25 DIAGNOSIS — G5621 Lesion of ulnar nerve, right upper limb: Secondary | ICD-10-CM | POA: Diagnosis not present

## 2017-11-23 DIAGNOSIS — E785 Hyperlipidemia, unspecified: Secondary | ICD-10-CM | POA: Diagnosis not present

## 2017-11-23 DIAGNOSIS — I10 Essential (primary) hypertension: Secondary | ICD-10-CM | POA: Diagnosis not present

## 2017-11-23 DIAGNOSIS — E119 Type 2 diabetes mellitus without complications: Secondary | ICD-10-CM | POA: Diagnosis not present

## 2017-11-24 DIAGNOSIS — G5603 Carpal tunnel syndrome, bilateral upper limbs: Secondary | ICD-10-CM | POA: Diagnosis not present

## 2017-11-24 DIAGNOSIS — G2581 Restless legs syndrome: Secondary | ICD-10-CM | POA: Diagnosis not present

## 2017-11-27 DIAGNOSIS — I10 Essential (primary) hypertension: Secondary | ICD-10-CM | POA: Diagnosis not present

## 2017-11-27 DIAGNOSIS — E785 Hyperlipidemia, unspecified: Secondary | ICD-10-CM | POA: Diagnosis not present

## 2017-12-08 ENCOUNTER — Inpatient Hospital Stay
Admission: EM | Admit: 2017-12-08 | Discharge: 2017-12-12 | DRG: 486 | Disposition: A | Discharge status: Home Health | Payer: 59 | Attending: Internal Medicine | Admitting: Internal Medicine

## 2017-12-08 ENCOUNTER — Emergency Department: Payer: 59

## 2017-12-08 ENCOUNTER — Inpatient Hospital Stay: Payer: 59

## 2017-12-08 ENCOUNTER — Inpatient Hospital Stay: Payer: 59 | Admitting: Anesthesiology

## 2017-12-08 ENCOUNTER — Encounter
Admission: EM | Disposition: A | Discharge status: Home Health | Payer: Self-pay | Source: Home / Self Care | Attending: Family Medicine

## 2017-12-08 ENCOUNTER — Other Ambulatory Visit: Payer: Self-pay

## 2017-12-08 DIAGNOSIS — F319 Bipolar disorder, unspecified: Secondary | ICD-10-CM | POA: Diagnosis present

## 2017-12-08 DIAGNOSIS — Z6841 Body Mass Index (BMI) 40.0 and over, adult: Secondary | ICD-10-CM | POA: Diagnosis not present

## 2017-12-08 DIAGNOSIS — Z96652 Presence of left artificial knee joint: Secondary | ICD-10-CM | POA: Diagnosis not present

## 2017-12-08 DIAGNOSIS — Z87891 Personal history of nicotine dependence: Secondary | ICD-10-CM | POA: Diagnosis not present

## 2017-12-08 DIAGNOSIS — Z7982 Long term (current) use of aspirin: Secondary | ICD-10-CM

## 2017-12-08 DIAGNOSIS — A4901 Methicillin susceptible Staphylococcus aureus infection, unspecified site: Secondary | ICD-10-CM | POA: Diagnosis not present

## 2017-12-08 DIAGNOSIS — Z96659 Presence of unspecified artificial knee joint: Secondary | ICD-10-CM

## 2017-12-08 DIAGNOSIS — X501XXA Overexertion from prolonged static or awkward postures, initial encounter: Secondary | ICD-10-CM | POA: Diagnosis not present

## 2017-12-08 DIAGNOSIS — Z79899 Other long term (current) drug therapy: Secondary | ICD-10-CM | POA: Diagnosis not present

## 2017-12-08 DIAGNOSIS — B999 Unspecified infectious disease: Secondary | ICD-10-CM

## 2017-12-08 DIAGNOSIS — G5600 Carpal tunnel syndrome, unspecified upper limb: Secondary | ICD-10-CM | POA: Diagnosis not present

## 2017-12-08 DIAGNOSIS — B9689 Other specified bacterial agents as the cause of diseases classified elsewhere: Secondary | ICD-10-CM

## 2017-12-08 DIAGNOSIS — M25462 Effusion, left knee: Secondary | ICD-10-CM | POA: Diagnosis not present

## 2017-12-08 DIAGNOSIS — Z888 Allergy status to other drugs, medicaments and biological substances status: Secondary | ICD-10-CM | POA: Diagnosis not present

## 2017-12-08 DIAGNOSIS — K760 Fatty (change of) liver, not elsewhere classified: Secondary | ICD-10-CM | POA: Diagnosis present

## 2017-12-08 DIAGNOSIS — E785 Hyperlipidemia, unspecified: Secondary | ICD-10-CM | POA: Diagnosis not present

## 2017-12-08 DIAGNOSIS — G629 Polyneuropathy, unspecified: Secondary | ICD-10-CM | POA: Diagnosis present

## 2017-12-08 DIAGNOSIS — R55 Syncope and collapse: Secondary | ICD-10-CM | POA: Diagnosis not present

## 2017-12-08 DIAGNOSIS — N189 Chronic kidney disease, unspecified: Secondary | ICD-10-CM | POA: Diagnosis not present

## 2017-12-08 DIAGNOSIS — Z79891 Long term (current) use of opiate analgesic: Secondary | ICD-10-CM | POA: Diagnosis not present

## 2017-12-08 DIAGNOSIS — B9561 Methicillin susceptible Staphylococcus aureus infection as the cause of diseases classified elsewhere: Secondary | ICD-10-CM | POA: Diagnosis present

## 2017-12-08 DIAGNOSIS — M009 Pyogenic arthritis, unspecified: Secondary | ICD-10-CM

## 2017-12-08 DIAGNOSIS — Z96653 Presence of artificial knee joint, bilateral: Secondary | ICD-10-CM | POA: Diagnosis present

## 2017-12-08 DIAGNOSIS — N179 Acute kidney failure, unspecified: Secondary | ICD-10-CM | POA: Diagnosis not present

## 2017-12-08 DIAGNOSIS — N183 Chronic kidney disease, stage 3 (moderate): Secondary | ICD-10-CM | POA: Diagnosis present

## 2017-12-08 DIAGNOSIS — I129 Hypertensive chronic kidney disease with stage 1 through stage 4 chronic kidney disease, or unspecified chronic kidney disease: Secondary | ICD-10-CM | POA: Diagnosis present

## 2017-12-08 DIAGNOSIS — T8454XA Infection and inflammatory reaction due to internal left knee prosthesis, initial encounter: Secondary | ICD-10-CM | POA: Diagnosis not present

## 2017-12-08 DIAGNOSIS — M71162 Other infective bursitis, left knee: Secondary | ICD-10-CM

## 2017-12-08 DIAGNOSIS — M00862 Arthritis due to other bacteria, left knee: Secondary | ICD-10-CM | POA: Diagnosis present

## 2017-12-08 DIAGNOSIS — Y831 Surgical operation with implant of artificial internal device as the cause of abnormal reaction of the patient, or of later complication, without mention of misadventure at the time of the procedure: Secondary | ICD-10-CM | POA: Diagnosis present

## 2017-12-08 DIAGNOSIS — L089 Local infection of the skin and subcutaneous tissue, unspecified: Secondary | ICD-10-CM | POA: Diagnosis not present

## 2017-12-08 DIAGNOSIS — E860 Dehydration: Secondary | ICD-10-CM | POA: Diagnosis present

## 2017-12-08 DIAGNOSIS — Z471 Aftercare following joint replacement surgery: Secondary | ICD-10-CM | POA: Diagnosis not present

## 2017-12-08 DIAGNOSIS — E669 Obesity, unspecified: Secondary | ICD-10-CM | POA: Diagnosis not present

## 2017-12-08 DIAGNOSIS — M02069 Arthropathy following intestinal bypass, unspecified knee: Secondary | ICD-10-CM | POA: Diagnosis not present

## 2017-12-08 DIAGNOSIS — G8911 Acute pain due to trauma: Secondary | ICD-10-CM | POA: Diagnosis not present

## 2017-12-08 DIAGNOSIS — F419 Anxiety disorder, unspecified: Secondary | ICD-10-CM | POA: Diagnosis present

## 2017-12-08 DIAGNOSIS — R079 Chest pain, unspecified: Secondary | ICD-10-CM | POA: Diagnosis not present

## 2017-12-08 DIAGNOSIS — R0602 Shortness of breath: Secondary | ICD-10-CM | POA: Diagnosis not present

## 2017-12-08 DIAGNOSIS — Z791 Long term (current) use of non-steroidal anti-inflammatories (NSAID): Secondary | ICD-10-CM

## 2017-12-08 DIAGNOSIS — G4730 Sleep apnea, unspecified: Secondary | ICD-10-CM | POA: Diagnosis present

## 2017-12-08 DIAGNOSIS — W010XXA Fall on same level from slipping, tripping and stumbling without subsequent striking against object, initial encounter: Secondary | ICD-10-CM | POA: Diagnosis present

## 2017-12-08 DIAGNOSIS — I10 Essential (primary) hypertension: Secondary | ICD-10-CM | POA: Diagnosis not present

## 2017-12-08 HISTORY — PX: KNEE ARTHROTOMY: SHX5881

## 2017-12-08 HISTORY — PX: I & D KNEE WITH POLY EXCHANGE: SHX5024

## 2017-12-08 LAB — CBC
HCT: 45.1 % (ref 40.0–52.0)
Hemoglobin: 15.8 g/dL (ref 13.0–18.0)
MCH: 32.3 pg (ref 26.0–34.0)
MCHC: 34.9 g/dL (ref 32.0–36.0)
MCV: 92.4 fL (ref 80.0–100.0)
Platelets: 319 10*3/uL (ref 150–440)
RBC: 4.88 MIL/uL (ref 4.40–5.90)
RDW: 14 % (ref 11.5–14.5)
WBC: 20.1 10*3/uL — ABNORMAL HIGH (ref 3.8–10.6)

## 2017-12-08 LAB — BASIC METABOLIC PANEL
Anion gap: 13 (ref 5–15)
BUN: 21 mg/dL — ABNORMAL HIGH (ref 6–20)
CALCIUM: 9.2 mg/dL (ref 8.9–10.3)
CO2: 24 mmol/L (ref 22–32)
CREATININE: 1.95 mg/dL — AB (ref 0.61–1.24)
Chloride: 99 mmol/L (ref 98–111)
GFR calc Af Amer: 43 mL/min — ABNORMAL LOW (ref >60)
GFR calc non Af Amer: 37 mL/min — ABNORMAL LOW (ref >60)
GLUCOSE: 168 mg/dL — AB (ref 70–99)
Potassium: 3.5 mmol/L (ref 3.5–5.1)
Sodium: 136 mmol/L (ref 135–145)

## 2017-12-08 LAB — URINALYSIS, COMPLETE (UACMP) WITH MICROSCOPIC
Bacteria, UA: NONE SEEN
Bilirubin Urine: NEGATIVE
GLUCOSE, UA: NEGATIVE mg/dL
HGB URINE DIPSTICK: NEGATIVE
Ketones, ur: NEGATIVE mg/dL
LEUKOCYTES UA: NEGATIVE
Nitrite: NEGATIVE
Protein, ur: 100 mg/dL — AB
SPECIFIC GRAVITY, URINE: 1.023 (ref 1.005–1.030)
pH: 5 (ref 5.0–8.0)

## 2017-12-08 LAB — SURGICAL PCR SCREEN
MRSA, PCR: NEGATIVE
Staphylococcus aureus: POSITIVE — AB

## 2017-12-08 LAB — C-REACTIVE PROTEIN: CRP: 22.9 mg/dL — AB (ref <1.0)

## 2017-12-08 LAB — SYNOVIAL CELL COUNT + DIFF, W/ CRYSTALS
CRYSTALS FLUID: NONE SEEN
Eosinophils-Synovial: 0 %
LYMPHOCYTES-SYNOVIAL FLD: 15 %
Monocyte-Macrophage-Synovial Fluid: 37 %
Neutrophil, Synovial: 48 %
OTHER CELLS-SYN: 0
WBC, Synovial: 53293 /mm3 — ABNORMAL HIGH (ref 0–200)

## 2017-12-08 LAB — SEDIMENTATION RATE: SED RATE: 42 mm/h — AB (ref 0–20)

## 2017-12-08 LAB — TROPONIN I: Troponin I: <0.03 ng/mL (ref <0.03)

## 2017-12-08 SURGERY — ARTHROTOMY, KNEE
Anesthesia: General | Site: Knee | Laterality: Left | Wound class: Dirty or Infected

## 2017-12-08 MED ORDER — BISACODYL 10 MG RE SUPP: 10.0000 mg | Freq: Every day | RECTAL | Status: DC | PRN

## 2017-12-08 MED ORDER — SODIUM CHLORIDE 0.9 % IJ SOLN
INTRAMUSCULAR | Status: AC
  Filled 2017-12-08: qty 50

## 2017-12-08 MED ORDER — SUGAMMADEX SODIUM 200 MG/2ML IV SOLN
INTRAVENOUS | Status: DC | PRN
  Administered 2017-12-08: 500 mg via INTRAVENOUS

## 2017-12-08 MED ORDER — BENAZEPRIL HCL 20 MG PO TABS
40.0000 mg | ORAL_TABLET | Freq: Every day | ORAL | Status: DC
  Administered 2017-12-09 – 2017-12-12 (×4): 40 mg via ORAL
  Filled 2017-12-08 (×4): qty 2

## 2017-12-08 MED ORDER — POTASSIUM CHLORIDE 2 MEQ/ML IV SOLN
INTRAVENOUS | Status: DC
  Filled 2017-12-08 (×2): qty 1000

## 2017-12-08 MED ORDER — SODIUM CHLORIDE 0.9 % IV SOLN
2.0000 g | Freq: Two times a day (BID) | INTRAVENOUS | Status: DC
  Administered 2017-12-09: 2 g via INTRAVENOUS
  Filled 2017-12-08: qty 20
  Filled 2017-12-08: qty 2
  Filled 2017-12-08: qty 20

## 2017-12-08 MED ORDER — PRAVASTATIN SODIUM 20 MG PO TABS
20.0000 mg | ORAL_TABLET | Freq: Every evening | ORAL | Status: DC
  Administered 2017-12-09: 20 mg via ORAL
  Filled 2017-12-08: qty 1

## 2017-12-08 MED ORDER — VITAMIN B-12 1000 MCG PO TABS
1000.0000 ug | ORAL_TABLET | Freq: Every day | ORAL | Status: DC
  Administered 2017-12-09 – 2017-12-12 (×4): 1000 ug via ORAL
  Filled 2017-12-08 (×4): qty 1

## 2017-12-08 MED ORDER — METOCLOPRAMIDE HCL 10 MG PO TABS
10.0000 mg | ORAL_TABLET | Freq: Three times a day (TID) | ORAL | Status: AC
  Administered 2017-12-09 – 2017-12-10 (×7): 10 mg via ORAL
  Filled 2017-12-08 (×8): qty 1

## 2017-12-08 MED ORDER — ONDANSETRON HCL 4 MG PO TABS: 4.0000 mg | ORAL_TABLET | Freq: Four times a day (QID) | ORAL | Status: DC | PRN

## 2017-12-08 MED ORDER — NEOMYCIN-POLYMYXIN B GU 40-200000 IR SOLN
Status: AC
  Filled 2017-12-08: qty 20

## 2017-12-08 MED ORDER — PAROXETINE HCL 20 MG PO TABS
60.0000 mg | ORAL_TABLET | Freq: Every day | ORAL | Status: DC
  Administered 2017-12-09 – 2017-12-12 (×4): 60 mg via ORAL
  Filled 2017-12-08 (×4): qty 3

## 2017-12-08 MED ORDER — DOCUSATE SODIUM 100 MG PO CAPS: 100.0000 mg | ORAL_CAPSULE | Freq: Two times a day (BID) | ORAL | Status: DC

## 2017-12-08 MED ORDER — TOBRAMYCIN SULFATE 1.2 G IJ SOLR
INTRAMUSCULAR | Status: AC
  Filled 2017-12-08: qty 2.4

## 2017-12-08 MED ORDER — OXYCODONE HCL 5 MG PO TABS
10.0000 mg | ORAL_TABLET | ORAL | Status: DC | PRN
  Administered 2017-12-09 – 2017-12-12 (×12): 10 mg via ORAL
  Filled 2017-12-08 (×13): qty 2

## 2017-12-08 MED ORDER — GENTAMICIN SULFATE 40 MG/ML IJ SOLN
INTRAMUSCULAR | Status: AC
  Filled 2017-12-08: qty 4

## 2017-12-08 MED ORDER — ACETAMINOPHEN 10 MG/ML IV SOLN
INTRAVENOUS | Status: AC
  Administered 2017-12-08: 1000 mg
  Filled 2017-12-08: qty 100

## 2017-12-08 MED ORDER — VANCOMYCIN HCL 1000 MG IV SOLR
INTRAVENOUS | Status: DC | PRN
  Administered 2017-12-08: 2 g

## 2017-12-08 MED ORDER — FENTANYL CITRATE (PF) 250 MCG/5ML IJ SOLN
INTRAMUSCULAR | Status: AC
Start: 2017-12-08 — End: ?
  Filled 2017-12-08: qty 5

## 2017-12-08 MED ORDER — VANCOMYCIN HCL 10 G IV SOLR
1500.0000 mg | Freq: Two times a day (BID) | INTRAVENOUS | Status: DC
  Administered 2017-12-09: 1500 mg via INTRAVENOUS
  Filled 2017-12-08 (×3): qty 1500

## 2017-12-08 MED ORDER — FERROUS SULFATE 325 (65 FE) MG PO TABS
325.0000 mg | ORAL_TABLET | Freq: Two times a day (BID) | ORAL | Status: DC
  Administered 2017-12-09 – 2017-12-12 (×7): 325 mg via ORAL
  Filled 2017-12-08 (×7): qty 1

## 2017-12-08 MED ORDER — MENTHOL 3 MG MT LOZG
1.0000 | LOZENGE | OROMUCOSAL | Status: DC | PRN
  Administered 2017-12-09: 3 mg via ORAL
  Filled 2017-12-08 (×2): qty 9

## 2017-12-08 MED ORDER — ROPINIROLE HCL 1 MG PO TABS
0.5000 mg | ORAL_TABLET | Freq: Every day | ORAL | Status: DC
  Administered 2017-12-09 – 2017-12-11 (×3): 0.5 mg via ORAL
  Filled 2017-12-08 (×3): qty 1

## 2017-12-08 MED ORDER — VASOPRESSIN 20 UNIT/ML IV SOLN
INTRAVENOUS | Status: AC
  Filled 2017-12-08: qty 1

## 2017-12-08 MED ORDER — DIPHENHYDRAMINE HCL 12.5 MG/5ML PO ELIX: 12.5000 mg | ORAL_SOLUTION | ORAL | Status: DC | PRN

## 2017-12-08 MED ORDER — OXYCODONE HCL 5 MG PO TABS
5.0000 mg | ORAL_TABLET | Freq: Every day | ORAL | Status: DC | PRN
  Administered 2017-12-08: 10 mg via ORAL
  Filled 2017-12-08: qty 2

## 2017-12-08 MED ORDER — ONDANSETRON HCL 4 MG/2ML IJ SOLN
4.0000 mg | Freq: Four times a day (QID) | INTRAMUSCULAR | Status: DC | PRN
  Administered 2017-12-12: 4 mg via INTRAVENOUS
  Filled 2017-12-08: qty 2

## 2017-12-08 MED ORDER — SODIUM CHLORIDE 0.9 % IV BOLUS
1000.0000 mL | Freq: Once | INTRAVENOUS | Status: AC
  Administered 2017-12-08: 1000 mL via INTRAVENOUS

## 2017-12-08 MED ORDER — MIDAZOLAM HCL 2 MG/2ML IJ SOLN
INTRAMUSCULAR | Status: AC
  Filled 2017-12-08: qty 2

## 2017-12-08 MED ORDER — BUSPIRONE HCL 10 MG PO TABS
5.0000 mg | ORAL_TABLET | Freq: Two times a day (BID) | ORAL | Status: DC
  Administered 2017-12-09 – 2017-12-12 (×7): 5 mg via ORAL
  Filled 2017-12-08 (×7): qty 1

## 2017-12-08 MED ORDER — ACETAMINOPHEN 325 MG PO TABS: 325.0000 mg | ORAL_TABLET | Freq: Four times a day (QID) | ORAL | Status: DC | PRN

## 2017-12-08 MED ORDER — METOCLOPRAMIDE HCL 10 MG PO TABS: 5.0000 mg | ORAL_TABLET | Freq: Three times a day (TID) | ORAL | Status: DC | PRN

## 2017-12-08 MED ORDER — SENNOSIDES-DOCUSATE SODIUM 8.6-50 MG PO TABS
1.0000 | ORAL_TABLET | Freq: Two times a day (BID) | ORAL | Status: DC
  Administered 2017-12-09 – 2017-12-11 (×5): 1 via ORAL
  Filled 2017-12-08 (×7): qty 1

## 2017-12-08 MED ORDER — CHLORTHALIDONE 25 MG PO TABS
12.5000 mg | ORAL_TABLET | Freq: Every day | ORAL | Status: DC
  Administered 2017-12-09 – 2017-12-12 (×4): 12.5 mg via ORAL
  Filled 2017-12-08 (×4): qty 0.5

## 2017-12-08 MED ORDER — FOLIC ACID 1 MG PO TABS
1.0000 mg | ORAL_TABLET | Freq: Every day | ORAL | Status: DC
  Administered 2017-12-09 – 2017-12-12 (×4): 1 mg via ORAL
  Filled 2017-12-08 (×4): qty 1

## 2017-12-08 MED ORDER — SUGAMMADEX SODIUM 500 MG/5ML IV SOLN
INTRAVENOUS | Status: AC
  Filled 2017-12-08: qty 5

## 2017-12-08 MED ORDER — VITAMIN C 500 MG PO TABS
500.0000 mg | ORAL_TABLET | Freq: Every day | ORAL | Status: DC
  Administered 2017-12-09 – 2017-12-12 (×4): 500 mg via ORAL
  Filled 2017-12-08 (×4): qty 1

## 2017-12-08 MED ORDER — LACTATED RINGERS IV SOLN
INTRAVENOUS | Status: DC | PRN
  Administered 2017-12-08: 21:00:00 via INTRAVENOUS

## 2017-12-08 MED ORDER — PHENOL 1.4 % MT LIQD
1.0000 | OROMUCOSAL | Status: DC | PRN
  Filled 2017-12-08 (×2): qty 177

## 2017-12-08 MED ORDER — ONDANSETRON HCL 4 MG/2ML IJ SOLN
4.0000 mg | Freq: Once | INTRAMUSCULAR | Status: AC
  Administered 2017-12-08: 4 mg via INTRAVENOUS
  Filled 2017-12-08: qty 2

## 2017-12-08 MED ORDER — FLEET ENEMA 7-19 GM/118ML RE ENEM: 1.0000 | ENEMA | Freq: Once | RECTAL | Status: DC | PRN

## 2017-12-08 MED ORDER — ONDANSETRON HCL 4 MG/2ML IJ SOLN
INTRAMUSCULAR | Status: DC | PRN
Start: 2017-12-08 — End: 2017-12-08
  Administered 2017-12-08: 4 mg via INTRAVENOUS

## 2017-12-08 MED ORDER — TRAMADOL HCL 50 MG PO TABS
50.0000 mg | ORAL_TABLET | ORAL | Status: DC | PRN
  Administered 2017-12-09 (×2): 50 mg via ORAL
  Administered 2017-12-11 – 2017-12-12 (×2): 100 mg via ORAL
  Filled 2017-12-08: qty 1
  Filled 2017-12-08: qty 2
  Filled 2017-12-08: qty 1
  Filled 2017-12-08: qty 2

## 2017-12-08 MED ORDER — GEMFIBROZIL 600 MG PO TABS
600.0000 mg | ORAL_TABLET | Freq: Two times a day (BID) | ORAL | Status: DC
  Administered 2017-12-09 – 2017-12-12 (×7): 600 mg via ORAL
  Filled 2017-12-08 (×9): qty 1

## 2017-12-08 MED ORDER — FENTANYL CITRATE (PF) 100 MCG/2ML IJ SOLN: 25.0000 ug | INTRAMUSCULAR | Status: DC | PRN

## 2017-12-08 MED ORDER — MIDAZOLAM HCL 2 MG/2ML IJ SOLN
INTRAMUSCULAR | Status: DC | PRN
  Administered 2017-12-08: 2 mg via INTRAVENOUS

## 2017-12-08 MED ORDER — IPRATROPIUM-ALBUTEROL 0.5-2.5 (3) MG/3ML IN SOLN
3.0000 mL | Freq: Once | RESPIRATORY_TRACT | Status: AC
  Administered 2017-12-08: 3 mL via RESPIRATORY_TRACT

## 2017-12-08 MED ORDER — SODIUM CHLORIDE 0.9 % IV SOLN
INTRAVENOUS | Status: DC | PRN
  Administered 2017-12-08: 20 ug/min via INTRAVENOUS

## 2017-12-08 MED ORDER — ACETAMINOPHEN 650 MG RE SUPP: 650.0000 mg | Freq: Four times a day (QID) | RECTAL | Status: DC | PRN

## 2017-12-08 MED ORDER — BUPIVACAINE-EPINEPHRINE (PF) 0.25% -1:200000 IJ SOLN
INTRAMUSCULAR | Status: AC
  Filled 2017-12-08: qty 30

## 2017-12-08 MED ORDER — AMLODIPINE BESY-BENAZEPRIL HCL 10-40 MG PO CAPS: 1.0000 | ORAL_CAPSULE | Freq: Every day | ORAL | Status: DC

## 2017-12-08 MED ORDER — ALPRAZOLAM 0.25 MG PO TABS
0.1250 mg | ORAL_TABLET | Freq: Two times a day (BID) | ORAL | Status: DC | PRN
  Administered 2017-12-11: 0.25 mg via ORAL
  Filled 2017-12-08: qty 1

## 2017-12-08 MED ORDER — METRONIDAZOLE IN NACL 5-0.79 MG/ML-% IV SOLN
500.0000 mg | Freq: Three times a day (TID) | INTRAVENOUS | Status: DC
  Administered 2017-12-08 – 2017-12-09 (×3): 500 mg via INTRAVENOUS
  Filled 2017-12-08 (×5): qty 100

## 2017-12-08 MED ORDER — PROMETHAZINE HCL 25 MG/ML IJ SOLN: 6.2500 mg | INTRAMUSCULAR | Status: DC | PRN

## 2017-12-08 MED ORDER — POLYETHYLENE GLYCOL 3350 17 G PO PACK: 17.0000 g | PACK | Freq: Every day | ORAL | Status: DC | PRN

## 2017-12-08 MED ORDER — ONDANSETRON HCL 4 MG/2ML IJ SOLN: 4.0000 mg | Freq: Four times a day (QID) | INTRAMUSCULAR | Status: DC | PRN

## 2017-12-08 MED ORDER — ROCURONIUM BROMIDE 50 MG/5ML IV SOLN
INTRAVENOUS | Status: AC
  Filled 2017-12-08: qty 1

## 2017-12-08 MED ORDER — SODIUM CHLORIDE 0.9 % IR SOLN
Status: DC | PRN
  Administered 2017-12-08: 8 mL

## 2017-12-08 MED ORDER — MORPHINE SULFATE (PF) 4 MG/ML IV SOLN
4.0000 mg | Freq: Once | INTRAVENOUS | Status: AC
  Administered 2017-12-08: 4 mg via INTRAVENOUS
  Filled 2017-12-08: qty 1

## 2017-12-08 MED ORDER — PHENYLEPHRINE HCL 10 MG/ML IJ SOLN
INTRAMUSCULAR | Status: DC | PRN
  Administered 2017-12-08: 200 ug via INTRAVENOUS
  Administered 2017-12-08 (×5): 100 ug via INTRAVENOUS

## 2017-12-08 MED ORDER — MAGNESIUM HYDROXIDE 400 MG/5ML PO SUSP
30.0000 mL | Freq: Every day | ORAL | Status: DC
  Administered 2017-12-09 – 2017-12-10 (×2): 30 mL via ORAL
  Filled 2017-12-08 (×3): qty 30

## 2017-12-08 MED ORDER — VANCOMYCIN HCL IN DEXTROSE 1-5 GM/200ML-% IV SOLN
1000.0000 mg | Freq: Once | INTRAVENOUS | Status: AC
  Administered 2017-12-08: 1000 mg via INTRAVENOUS
  Filled 2017-12-08: qty 200

## 2017-12-08 MED ORDER — MUPIROCIN 2 % EX OINT
1.0000 "application " | TOPICAL_OINTMENT | Freq: Two times a day (BID) | CUTANEOUS | Status: AC
  Administered 2017-12-09 – 2017-12-10 (×2): 1 via NASAL
  Filled 2017-12-08: qty 22

## 2017-12-08 MED ORDER — FENTANYL CITRATE (PF) 250 MCG/5ML IJ SOLN
INTRAMUSCULAR | Status: AC
  Filled 2017-12-08: qty 5

## 2017-12-08 MED ORDER — PANTOPRAZOLE SODIUM 40 MG PO TBEC
40.0000 mg | DELAYED_RELEASE_TABLET | Freq: Two times a day (BID) | ORAL | Status: DC
  Administered 2017-12-09 – 2017-12-12 (×7): 40 mg via ORAL
  Filled 2017-12-08 (×7): qty 1

## 2017-12-08 MED ORDER — BUPIVACAINE HCL (PF) 0.25 % IJ SOLN
INTRAMUSCULAR | Status: AC
  Filled 2017-12-08: qty 30

## 2017-12-08 MED ORDER — VASOPRESSIN 20 UNIT/ML IV SOLN
INTRAVENOUS | Status: DC | PRN
  Administered 2017-12-08: 2 [IU] via INTRAVENOUS

## 2017-12-08 MED ORDER — AMLODIPINE BESYLATE 10 MG PO TABS
10.0000 mg | ORAL_TABLET | Freq: Every day | ORAL | Status: DC
  Administered 2017-12-09 – 2017-12-12 (×4): 10 mg via ORAL
  Filled 2017-12-08 (×4): qty 1

## 2017-12-08 MED ORDER — SODIUM CHLORIDE 0.9 % IV SOLN
INTRAVENOUS | Status: DC
  Administered 2017-12-09: via INTRAVENOUS

## 2017-12-08 MED ORDER — OCUVITE-LUTEIN PO CAPS
1.0000 | ORAL_CAPSULE | Freq: Every day | ORAL | Status: DC
  Administered 2017-12-09 – 2017-12-12 (×4): 1 via ORAL
  Filled 2017-12-08 (×4): qty 1

## 2017-12-08 MED ORDER — SODIUM CHLORIDE 0.9 % IR SOLN
Status: DC | PRN
  Administered 2017-12-08: 4000 mL via SURGICAL_CAVITY

## 2017-12-08 MED ORDER — CEFTRIAXONE SODIUM 1 G IJ SOLR
1.0000 g | Freq: Once | INTRAMUSCULAR | Status: AC
  Administered 2017-12-08: 1 g via INTRAVENOUS
  Filled 2017-12-08: qty 10

## 2017-12-08 MED ORDER — ACETAMINOPHEN 10 MG/ML IV SOLN
1000.0000 mg | Freq: Four times a day (QID) | INTRAVENOUS | Status: DC
  Administered 2017-12-09 (×2): 1000 mg via INTRAVENOUS
  Filled 2017-12-08 (×4): qty 100

## 2017-12-08 MED ORDER — SODIUM CHLORIDE 0.9 % IJ SOLN
INTRAMUSCULAR | Status: AC
  Filled 2017-12-08: qty 20

## 2017-12-08 MED ORDER — PROPOFOL 10 MG/ML IV BOLUS
INTRAVENOUS | Status: AC
  Filled 2017-12-08: qty 40

## 2017-12-08 MED ORDER — ACETAMINOPHEN 325 MG PO TABS: 650.0000 mg | ORAL_TABLET | Freq: Four times a day (QID) | ORAL | Status: DC | PRN

## 2017-12-08 MED ORDER — METOCLOPRAMIDE HCL 5 MG/ML IJ SOLN: 5.0000 mg | Freq: Three times a day (TID) | INTRAMUSCULAR | Status: DC | PRN

## 2017-12-08 MED ORDER — OXYCODONE HCL 5 MG PO TABS
5.0000 mg | ORAL_TABLET | ORAL | Status: DC | PRN
  Administered 2017-12-09: 5 mg via ORAL

## 2017-12-08 MED ORDER — ENOXAPARIN SODIUM 30 MG/0.3ML ~~LOC~~ SOLN
30.0000 mg | Freq: Two times a day (BID) | SUBCUTANEOUS | Status: DC
  Administered 2017-12-09 – 2017-12-12 (×7): 30 mg via SUBCUTANEOUS
  Filled 2017-12-08 (×7): qty 0.3

## 2017-12-08 MED ORDER — FENTANYL CITRATE (PF) 100 MCG/2ML IJ SOLN
INTRAMUSCULAR | Status: DC | PRN
  Administered 2017-12-08 (×4): 50 ug via INTRAVENOUS
  Administered 2017-12-08: 100 ug via INTRAVENOUS
  Administered 2017-12-08: 50 ug via INTRAVENOUS
  Administered 2017-12-08: 150 ug via INTRAVENOUS

## 2017-12-08 MED ORDER — ROCURONIUM BROMIDE 100 MG/10ML IV SOLN
INTRAVENOUS | Status: DC | PRN
  Administered 2017-12-08: 50 mg via INTRAVENOUS
  Administered 2017-12-08: 40 mg via INTRAVENOUS
  Administered 2017-12-08 (×2): 10 mg via INTRAVENOUS

## 2017-12-08 MED ORDER — IPRATROPIUM-ALBUTEROL 0.5-2.5 (3) MG/3ML IN SOLN
RESPIRATORY_TRACT | Status: AC
  Administered 2017-12-08: 3 mL via RESPIRATORY_TRACT
  Filled 2017-12-08: qty 3

## 2017-12-08 MED ORDER — GABAPENTIN 600 MG PO TABS
600.0000 mg | ORAL_TABLET | Freq: Three times a day (TID) | ORAL | Status: DC
  Administered 2017-12-09 – 2017-12-12 (×10): 600 mg via ORAL
  Filled 2017-12-08 (×14): qty 1

## 2017-12-08 MED ORDER — HYDROMORPHONE HCL 1 MG/ML IJ SOLN: 0.5000 mg | INTRAMUSCULAR | Status: DC | PRN

## 2017-12-08 MED ORDER — VANCOMYCIN HCL 1000 MG IV SOLR
INTRAVENOUS | Status: AC
  Filled 2017-12-08: qty 2000

## 2017-12-08 MED ORDER — ALUM & MAG HYDROXIDE-SIMETH 200-200-20 MG/5ML PO SUSP: 30.0000 mL | ORAL | Status: DC | PRN

## 2017-12-08 MED ORDER — PROPOFOL 10 MG/ML IV BOLUS
INTRAVENOUS | Status: DC | PRN
  Administered 2017-12-08: 200 mg via INTRAVENOUS

## 2017-12-08 MED ORDER — SUCCINYLCHOLINE CHLORIDE 20 MG/ML IJ SOLN
INTRAMUSCULAR | Status: DC | PRN
  Administered 2017-12-08: 160 mg via INTRAVENOUS

## 2017-12-08 MED ORDER — BUPIVACAINE LIPOSOME 1.3 % IJ SUSP
INTRAMUSCULAR | Status: AC
  Filled 2017-12-08: qty 20

## 2017-12-08 MED ORDER — LACTATED RINGERS IV SOLN
INTRAVENOUS | Status: DC | PRN
  Administered 2017-12-08 (×3): via INTRAVENOUS

## 2017-12-08 SURGICAL SUPPLY — 67 items
CANISTER SUCT 1200ML W/VALVE (MISCELLANEOUS) ×3 IMPLANT
CANISTER SUCT 3000ML PPV (MISCELLANEOUS) ×6 IMPLANT
COOLER POLAR GLACIER W/PUMP (MISCELLANEOUS) ×3 IMPLANT
CUFF TOURN 24 STER (MISCELLANEOUS) IMPLANT
CUFF TOURN 30 STER DUAL PORT (MISCELLANEOUS) IMPLANT
DRSG DERMACEA 8X12 NADH (GAUZE/BANDAGES/DRESSINGS) ×3 IMPLANT
DRSG OPSITE POSTOP 4X14 (GAUZE/BANDAGES/DRESSINGS) ×3 IMPLANT
DRSG TEGADERM 4X4.75 (GAUZE/BANDAGES/DRESSINGS) ×3 IMPLANT
DURAPREP 26ML APPLICATOR (WOUND CARE) ×6 IMPLANT
ELECT CAUTERY BLADE 6.4 (BLADE) ×3 IMPLANT
ELECT REM PT RETURN 9FT ADLT (ELECTROSURGICAL) ×3
ELECTRODE REM PT RTRN 9FT ADLT (ELECTROSURGICAL) ×1 IMPLANT
GLOVE BIOGEL M STRL SZ7.5 (GLOVE) ×6 IMPLANT
GLOVE BIOGEL PI IND STRL 9 (GLOVE) ×1 IMPLANT
GLOVE BIOGEL PI INDICATOR 9 (GLOVE) ×2
GLOVE INDICATOR 8.0 STRL GRN (GLOVE) ×3 IMPLANT
GLOVE SURG 9.0 ORTHO LTXF (GLOVE) ×3 IMPLANT
GLOVE SURG ORTHO 9.0 STRL STRW (GLOVE) ×3 IMPLANT
GLOVE SURG SYN 9.0  PF PI (GLOVE) ×2
GLOVE SURG SYN 9.0 PF PI (GLOVE) ×1 IMPLANT
GOWN STRL REUS W/ TWL LRG LVL3 (GOWN DISPOSABLE) ×2 IMPLANT
GOWN STRL REUS W/TWL 2XL LVL3 (GOWN DISPOSABLE) ×3 IMPLANT
GOWN STRL REUS W/TWL LRG LVL3 (GOWN DISPOSABLE) ×4
HANDPIECE VERSAJET DEBRIDEMENT (MISCELLANEOUS) ×3 IMPLANT
HEMOVAC 400CC 10FR (MISCELLANEOUS) ×3 IMPLANT
HOLDER FOLEY CATH W/STRAP (MISCELLANEOUS) ×3 IMPLANT
HOOD PEEL AWAY FLYTE STAYCOOL (MISCELLANEOUS) ×6 IMPLANT
INSERT TIBIAL FIXED SZ5 LEFT (Insert) ×3 IMPLANT
KIT STIMULAN RAPID CURE 5CC (Orthopedic Implant) ×6 IMPLANT
KIT TURNOVER KIT A (KITS) ×3 IMPLANT
LABEL OR SOLS (LABEL) ×3 IMPLANT
NDL SAFETY ECLIPSE 18X1.5 (NEEDLE) ×1 IMPLANT
NEEDLE HYPO 18GX1.5 SHARP (NEEDLE) ×2
NEEDLE HYPO 22GX1.5 SAFETY (NEEDLE) ×3 IMPLANT
NEEDLE SPNL 20GX3.5 QUINCKE YW (NEEDLE) ×6 IMPLANT
NS IRRIG 500ML POUR BTL (IV SOLUTION) ×3 IMPLANT
PACK TOTAL KNEE (MISCELLANEOUS) ×3 IMPLANT
PAD ABD DERMACEA PRESS 5X9 (GAUZE/BANDAGES/DRESSINGS) ×3 IMPLANT
PAD WRAPON POLAR KNEE (MISCELLANEOUS) ×1 IMPLANT
PIN FIXATION 1/8DIA X 3INL (PIN) ×9 IMPLANT
PULSAVAC PLUS IRRIG FAN TIP (DISPOSABLE) ×3
SOL .9 NS 3000ML IRR  AL (IV SOLUTION) ×2
SOL .9 NS 3000ML IRR UROMATIC (IV SOLUTION) ×1 IMPLANT
SOL PREP PVP 2OZ (MISCELLANEOUS) ×3
SOLUTION PREP PVP 2OZ (MISCELLANEOUS) ×1 IMPLANT
SPONGE DRAIN TRACH 4X4 STRL 2S (GAUZE/BANDAGES/DRESSINGS) ×3 IMPLANT
STAPLER SKIN PROX 35W (STAPLE) ×3 IMPLANT
STOCKINETTE BIAS CUT 6 980064 (GAUZE/BANDAGES/DRESSINGS) ×3 IMPLANT
STRAP TIBIA SHORT (MISCELLANEOUS) ×3 IMPLANT
SUCTION FRAZIER HANDLE 10FR (MISCELLANEOUS) ×2
SUCTION TUBE FRAZIER 10FR DISP (MISCELLANEOUS) ×1 IMPLANT
SUT MNCRL AB 4-0 PS2 18 (SUTURE) ×3 IMPLANT
SUT MON AB 2-0 CT1 36 (SUTURE) ×3 IMPLANT
SUT PROLENE 1 CT 1 30 (SUTURE) ×6 IMPLANT
SUT VIC AB 0 CT1 36 (SUTURE) ×3 IMPLANT
SUT VIC AB 1 CT1 36 (SUTURE) ×6 IMPLANT
SUT VIC AB 2-0 CT2 27 (SUTURE) ×3 IMPLANT
SUTURE MONOCRYL 2-0 UR6 VIOLOT (SUTURE) ×6 IMPLANT
SWAB CULTURE AMIES ANAERIB BLU (MISCELLANEOUS) ×6 IMPLANT
SYR 10ML LL (SYRINGE) ×3 IMPLANT
SYR 20CC LL (SYRINGE) ×3 IMPLANT
SYR 30ML LL (SYRINGE) ×6 IMPLANT
SYR 50ML LL SCALE MARK (SYRINGE) ×3 IMPLANT
TIP FAN IRRIG PULSAVAC PLUS (DISPOSABLE) ×1 IMPLANT
TOWEL OR 17X26 4PK STRL BLUE (TOWEL DISPOSABLE) ×3 IMPLANT
TRAY FOLEY MTR SLVR 16FR STAT (SET/KITS/TRAYS/PACK) ×3 IMPLANT
WRAPON POLAR PAD KNEE (MISCELLANEOUS) ×3

## 2017-12-08 NOTE — Progress Notes (Signed)
Pharmacy Antibiotic Note  Nathan Chambers is a 52 y.o. male admitted on 12/08/2017 with a septic knee.  Pharmacy has been consulted for vancomycin dosing.  He complains of left knee pain and swelling after sustaining a fall on Wednesday. He recalls lacerating his LLL with a Weedeater approximately 2 weeks ago. Pt is s/p  bilateral TKA 2017. Ortho is planning a wash-out procedure of the knee  Plan: Vancomycin 1500mg  IV every 12 hours beginning 6 hours after 1000mg  dose in the ED. Ke: 0.061, Vd: 106L, T1/2: 11h, calculated concentrations at steady-state: 34.1/17.4 mcg/mL Goal trough 15-20 mcg/mL. Vt prior to the 4th dose  Height: 6\' 3"  (190.5 cm) Weight: (!) 335 lb (152 kg) IBW/kg (Calculated) : 84.5  Temp (24hrs), Avg:98.7 F (37.1 C), Min:98.7 F (37.1 C), Max:98.7 F (37.1 C)  Recent Labs  Lab 12/08/17 0803  WBC 20.1*  CREATININE 1.95*    Estimated Creatinine Clearance: 68.3 mL/min (A) (by C-G formula based on SCr of 1.95 mg/dL (H)).    Allergies  Allergen Reactions  . Etodolac Itching    Antimicrobials this admission: Vancomycin 8/9 >> Ceftriaxone 8/9 >>  Flagyl 8/9 >>  Microbiology results: 8/9 Synovial fluid Cx: pending  Thank you for allowing pharmacy to be a part of this patient's care.  Dallie Piles, PharmD 12/08/2017 2:39 PM

## 2017-12-08 NOTE — ED Triage Notes (Signed)
Pt states he has been having syncopal episodes and has been seeing a PCP for that , states Wednesday when he passed out he injured his left knee with a hx of TKR. Pt states at around 3am having dry heaves and diarrhea.

## 2017-12-08 NOTE — Consult Note (Signed)
ORTHOPAEDIC CONSULTATION  PATIENT NAME: Nathan Chambers DOB: [birth year]  MRN: [id number]  REQUESTING PHYSICIAN: Salary, Avel Peace, MD  Chief Complaint: left knee pain and swelling  HPI: Nathan Chambers is a 52 y.o. male who complains of  The recent onset of left knee pain and swelling. He sustained a fall on Wednesday, but did note some swelling prior to that time. He recalls lacerating his left lower leg with a Weedeater approximately 2 weeks ago, but denies any other source of infection. He denies any fevers or chills. He is status post bilateral total knee arthroplasties performed by Dr. Rudene Christians in 2017.  The left kneee was aspirated by the ER physician with 53,293 WBC, 48% PMN. CBC demonstrated WBC of 20.1.  Past Medical History:  Diagnosis Date  . Anxiety   . Arthritis    knees, hands  . Bipolar disorder ([place])   . Chronic kidney disease    arf 2014 due to dehydration  . Headache   . Hemorrhoids   . Hyperlipidemia   . Hypertension   . LFT elevation   . NAFLD (nonalcoholic fatty liver disease)   . Sleep apnea    Past Surgical History:  Procedure Laterality Date  . ACHILLES TENDON SURGERY Right 10/30/2015   Procedure: ACHILLES TENDON REPAIR/ HEGLAND OSTECTOMY;  Surgeon: [name], DPM;  Location: [facility];  Service: Podiatry;  Laterality: Right;  . COLONOSCOPY    . COLONOSCOPY WITH PROPOFOL N/A 04/07/2017   Procedure: COLONOSCOPY WITH PROPOFOL;  Surgeon: Toledo, Benay Pike, MD;  Location: [facility];  Service: Gastroenterology;  Laterality: N/A;  . FRACTURE SURGERY Left    MIDDLE FINGER  . HERNIA REPAIR    . JOINT REPLACEMENT    . LIPOMA EXCISION    . TOTAL KNEE ARTHROPLASTY Bilateral 04/19/2016   Procedure: TOTAL KNEE BILATERAL;  Surgeon: Hessie Knows, MD;  Location: [facility] OR[facility];  Service: Orthopedics;  Laterality: Bilateral;  . VASECTOMY     Social History   Socioeconomic History  . Marital status: Married    Spouse name: Not on file  . Number of children: Not on  file  . Years of education: Not on file  . Highest education level: Not on file  Occupational History  . Not on file  Social Needs  . Financial resource strain: Not on file  . Food insecurity:    Worry: Not on file    Inability: Not on file  . Transportation needs:    Medical: Not on file    Non-medical: Not on file  Tobacco Use  . Smoking status: Never Smoker  . Smokeless tobacco: Former Systems developer    Types: Chew  Substance and Sexual Activity  . Alcohol use: No  . Drug use: No  . Sexual activity: Yes  Lifestyle  . Physical activity:    Days per week: Not on file    Minutes per session: Not on file  . Stress: Not on file  Relationships  . Social connections:    Talks on phone: Not on file    Gets together: Not on file    Attends religious service: Not on file    Active member of club or organization: Not on file    Attends meetings of clubs or organizations: Not on file    Relationship status: Not on file  Other Topics Concern  . Not on file  Social History Narrative  . Not on file   No family history on file. Allergies  Allergen Reactions  . Etodolac  Itching   Prior to Admission medications   Medication Sig Start Date End Date Taking? Authorizing Provider  Alpha-Lipoic Acid 600 MG CAPS Take 600 mg by mouth daily. 05/25/17  Yes [provider]  ALPRAZolam Duanne Moron) 0.25 MG tablet Take 0.5-1 tablets by mouth every 12 (twelve) hours as needed for anxiety. 11/22/17  Yes [provider]  amLODipine-benazepril (LOTREL) 10-40 MG capsule Take 1 capsule by mouth daily. 12/06/17  Yes [provider]  aspirin 81 MG tablet Take 81 mg by mouth daily.   Yes [provider]  busPIRone (BUSPAR) 5 MG tablet Take 5 mg by mouth 2 (two) times daily.   Yes [provider]  chlorthalidone (HYGROTON) 25 MG tablet Take 12.5 mg by mouth daily. 11/04/17  Yes [provider]  folic acid (FOLVITE) 1 MG tablet Take 1 mg by mouth daily.    Yes [provider]  gabapentin (NEURONTIN) 600 MG tablet Take 1,200 mg by mouth 3 (three) times daily.   Yes [provider]  gemfibrozil (LOPID) 600 MG tablet Take 600 mg by mouth 2 (two) times daily before a meal.   Yes [provider]  meloxicam (MOBIC) 15 MG tablet Take 15 mg by mouth daily. 09/10/17  Yes [provider]  multivitamin-lutein (OCUVITE-LUTEIN) CAPS capsule Take 1 capsule by mouth daily.   Yes [provider]  naloxone (NARCAN) 0.4 MG/ML injection Inject 1 mL (0.4 mg total) into the vein as needed (respiratory rate < 10). 04/22/16  Yes Duanne Guess, PA-C  oxyCODONE (OXY IR/ROXICODONE) 5 MG immediate release tablet Take 1-2 tablets (5-10 mg total) by mouth every 3 (three) hours as needed for breakthrough pain. Patient taking differently: Take 5-10 mg by mouth daily as needed for moderate pain or breakthrough pain.  04/22/16  Yes Duanne Guess, PA-C  PARoxetine (PAXIL) 20 MG tablet Take 60 mg by mouth daily.    Yes [provider]  pravastatin (PRAVACHOL) 20 MG tablet Take 20 mg by mouth every evening.    Yes [provider]  rOPINIRole (REQUIP) 0.5 MG tablet Take 0.5 mg by mouth at bedtime. 09/20/17  Yes [provider]  vitamin B-12 (CYANOCOBALAMIN) 1000 MCG tablet Take 1,000 mcg by mouth daily.    Yes [provider]  vitamin C (ASCORBIC ACID) 500 MG tablet Take 500 mg by mouth daily.   Yes [provider]  amLODipine (NORVASC) 10 MG tablet Take 1 tablet (10 mg total) by mouth daily. Patient not taking: Reported on 12/08/2017 04/22/16   Duanne Guess, PA-C  enoxaparin (LOVENOX) 40 MG/0.4ML injection Inject 0.4 mLs (40 mg total) into the skin daily. Patient not taking: Reported on 04/07/2017 04/22/16   Renata Caprice   Dg Chest 2 View  Result Date: 12/08/2017 CLINICAL DATA:  Chest pain, shortness of breath. Preop for knee surgery. EXAM: CHEST - 2 VIEW COMPARISON:  None. FINDINGS: The heart  size and mediastinal contours are within normal limits. Both lungs are clear. No pneumothorax or pleural effusion is noted. The visualized skeletal structures are unremarkable. IMPRESSION: No active cardiopulmonary disease. Electronically Signed   By: Marijo Conception, M.D.   On: 12/08/2017 13:57   Dg Knee Complete 4 Views Left  Result Date: 12/08/2017 CLINICAL DATA:  Pain following fall EXAM: LEFT KNEE - COMPLETE 4+ VIEW COMPARISON:  April 19, 2016 FINDINGS: Frontal, lateral, and bilateral oblique views were obtained. There is a total knee replacement with femoral and tibial prosthetic components  well-seated. No acute fracture or dislocation is evident. There is a large joint effusion. There is spurring along the anterior patella. There is proximal tibiofibular syndesmosis with arthropathy in this area. There is an old healed fracture of the proximal fibular diaphysis. IMPRESSION: Total knee replacement with prosthetic components well-seated. Old healed fracture proximal fibula with proximal tibia-fibular syndesmosis. No acute fracture or dislocation evident. Large joint effusion. Traumatic etiology for this joint effusion must be questioned. Electronically Signed   By: Lowella Grip III M.D.   On: 12/08/2017 09:22    Positive ROS: All other systems have been reviewed and were otherwise negative with the exception of those mentioned in the HPI and as above.  Physical Exam: General: Well developed, well nourished male seen in no acute distress. HEENT: Atraumatic and normocephalic. Sclera are clear. Extraocular motion is intact. Oropharynx is clear with moist mucosa. Neck: Supple, nontender, good range of motion. No JVD or carotid bruits. Lungs: Clear to auscultation bilaterally. Cardiovascular: Regular rate and rhythm with normal S1 and S2. No murmurs. No gallops or rubs. Pedal pulses are palpable bilaterally. Homans test is negative bilaterally. No significant pretibial or ankle edema. Abdomen:  Soft, nontender, and nondistended. Bowel sounds are present. Skin: No lesions in the area of chief complaint Neurologic: Awake, alert, and oriented. Sensory function is grossly intact. Motor strength is felt to be 5 over 5 bilaterally. No clonus or tremor. Good motor coordination. Lymphatic: No axillary or cervical lymphadenopathy  MUSCULOSKELETAL: Left knee is warm and swollen. No erythema. Positive effusion. Knee range of motion is limited due to guarding. Stable to varus and valgus stress. There is a partially healed lesion to the posterior left ankle without erythema or drainage.  Assessment: Left knee periprosthetic infection  Plan: The findings were discussed in detail with the patient. Recommendation was made for irrigation and debridement, polyethylene exchange, and placement of antibiotic beads, The usual perioperative course was discussed. The risks and benefits of surgical intervention were reviewed. The patient expressed understanding of the risks and benefits and agreed with plans for surgical intervention.   The surgical site was signed as per the "right site surgery" protocol.   Tyller Bowlby P. Holley Bouche M.D.

## 2017-12-08 NOTE — Progress Notes (Signed)
Patient was admitted to room 154 from ER. Planning to OR. IV ABX running. Able to walk from cart to room bed with assistance. A&O x4.  Signed consent on chart. CHG bath given. Urinated via urinal, dark yellow in color. Swabbed for MRSA, sent to lab. Family at bedside

## 2017-12-08 NOTE — Op Note (Addendum)
OPERATIVE NOTE  DATE OF SURGERY:  12/08/2017  PATIENT NAME:  Nathan Chambers   DOB: 1966-03-07  MRN: [id number]   PRE-OPERATIVE DIAGNOSIS: Left knee periprosthetic infection  POST-OPERATIVE DIAGNOSIS:  Same  PROCEDURE: Left knee arthrotomy, irrigation and debridement, polyethylene exchange, and placement of antibiotic-impregnated biocomposite beads  SURGEON:  Marciano Sequin., M.D.   ANESTHESIA: general  ESTIMATED BLOOD LOSS: 100 mL  FLUIDS REPLACED: 3000 mL of crystalloid  TOURNIQUET TIME: 120   DRAINS: 2 medium Hemovac drains  IMPLANTS UTILIZED: Medacta GMK Sphere size 5 left 10 mm polyethylene, 10 cc Stimulan biocomposite beads (4 mm size) with 2 g of vancomycin and 320 mg of gentamicin  INDICATIONS FOR SURGERY: Nathan Chambers is a 52 y.o. year old male who has been seen for complaints of left knee pain and swelling.  He had previously undergone bilateral total knee arthroplasties.  Aspiration of the left knee demonstrated elevated white count suggestive of a periprosthetic infection.. After discussion of the risks and benefits of surgical intervention, the patient expressed understanding of the risks benefits and agree with plans for irrigation and debridement of the left knee, polyethylene exchange, and placement of antibiotic impregnated biocomposite beads.Marland Kitchen   PROCEDURE IN DETAIL: The patient was brought into the operating room and, after adequate general endotracheal anesthesia was achieved, tourniquet was placed in the patient's upper left thigh.  Patient's left knee and leg were cleaned and prepped with alcohol and DuraPrep and draped in the usual sterile fashion.  A "timeout" was performed as per usual protocol.  The left lower extremity was elevated so as to exsanguinate the leg and the tourniquet was inflated to 300 mmHg.  An anterior longitudinal incision was made along with the previous surgical incision.  A medial parapatellar approach was utilized.  A large effusion was  evacuated.  The deep fibers of the medial collateral ligament were elevated in subperiosteal fashion off the medial flare of the tibia so as to maintain a continuous soft tissue sleeve.  The patella was subluxed laterally.  Abundant inflamed fibrotic tissue was encountered in both the medial and lateral gutters as well as the suprapatellar pouch.  Tissue specimen was submitted for stat Gram stain culture and sensitivities.  The fibrotic tissue was excised using electrocautery so is to reestablish the medial and lateral gutters as well as the suprapatellar pouch.  The knee was then flexed and the locking screw for the polyethylene was removed and the polyethylene was removed.  There was noted to be some wear and deformation to the medial rim of the polyethylene.  Soft tissue from the posterior recess was also excised using the electrocautery.  Swabs were obtained from the posterior recess for stat Gram stain culture and sensitivity.  The interface between the implants and the bone were inspected and noted to be in good condition.  No evidence of loosening to either the femoral or tibial components.  The versa jet was used to aggressively debride the medial, lateral, and posterior gutters of the knee as well as the suprapatellar pouch.  Next, a dilute solution of Betadine (17.5 mL and 500 mL of normal saline) was used to soak the knee for approximately 5 to 8 minutes.  The knee was then irrigated with an additional 3 L of normal saline with antibiotic solution.  A total of 10 cc of Stimulan biocomposite beads were combined with a total of 2 g of vancomycin and 320 mg of gentamicin to form for millimeter beads.  The  beads were then inserted on the posterior aspect of the knee as well as on the medial and lateral gutters.  The Medacta GMK Sphere size 5 left 10 mm polyethylene implant was then inserted and locked into place with a locking screw.  Full extension was noted with good flexion achieved.  Additional Stimulan  beads were inserted along the suprapatellar pouch.  The tourniquet was deflated after total tourniquet time of 120 minutes.  Two medium Hemovac drains were inserted and brought out through separate stab incisions.  The medial parapatellar incision was reapproximated using interrupted sutures of #1 Prolene.  Subcutaneous tissue was approximated in layers using first #0 Monocryl followed by #2-0 Monocryl.  The skin was closed with skin staples.  A sterile dressing was applied.  Subsequent call back from the lab had both specimens demonstrating moderate WBCs and gram-positive cocci.  The patient tolerated procedure well.  He was transported to the recovery room in stable condition.  Siniyah Evangelist P. Holley Bouche M.D.

## 2017-12-08 NOTE — Progress Notes (Signed)
Patient sent to OR, Signed consent in chart.

## 2017-12-08 NOTE — Anesthesia Post-op Follow-up Note (Signed)
Anesthesia QCDR form completed.        

## 2017-12-08 NOTE — Anesthesia Preprocedure Evaluation (Deleted)
Anesthesia Evaluation  Patient identified by MRN, date of birth, ID band Patient awake    Reviewed: Allergy & Precautions, NPO status , Patient's Chart, lab work & pertinent test results  History of Anesthesia Complications Negative for: history of anesthetic complications  Airway Mallampati: III  TM Distance: >3 FB     Dental  (+) Chipped   Pulmonary neg pulmonary ROS,    Pulmonary exam normal        Cardiovascular hypertension, Pt. on medications Normal cardiovascular exam     Neuro/Psych  Headaches, PSYCHIATRIC DISORDERS Anxiety Bipolar Disorder negative neurological ROS     GI/Hepatic negative GI ROS, Neg liver ROS, Non-alcoholic fatty liver   Endo/Other  negative endocrine ROS  Renal/GU Renal InsufficiencyRenal diseasenegative Renal ROS  negative genitourinary   Musculoskeletal  (+) Arthritis ,   Abdominal   Peds negative pediatric ROS (+)  Hematology negative hematology ROS (+)   Anesthesia Other Findings Past Medical History: No date: Anxiety No date: Arthritis     Comment: knees, hands No date: Bipolar disorder ([facility]) No date: Chronic kidney disease     Comment: arf 2014 due to dehydration No date: Headache No date: Hemorrhoids No date: Hyperlipidemia No date: Hypertension No date: LFT elevation No date: NAFLD (nonalcoholic fatty liver disease)  Reproductive/Obstetrics                             Anesthesia Physical  Anesthesia Plan  ASA: II  Anesthesia Plan: General   Post-op Pain Management:    Induction: Intravenous and Rapid sequence  PONV Risk Score and Plan:   Airway Management Planned: Oral ETT  Additional Equipment:   Intra-op Plan:   Post-operative Plan: Extubation in OR  Informed Consent: I have reviewed the patients History and Physical, chart, labs and discussed the procedure including the risks, benefits and alternatives for the proposed  anesthesia with the patient or authorized representative who has indicated his/her understanding and acceptance.   Dental advisory given  Plan Discussed with: CRNA and Surgeon  Anesthesia Plan Comments:         Anesthesia Quick Evaluation

## 2017-12-08 NOTE — Anesthesia Procedure Notes (Signed)
Procedure Name: Intubation Date/Time: 12/08/2017 5:46 PM Performed by: Jonna Clark, CRNA Pre-anesthesia Checklist: Patient identified, Patient being monitored, Timeout performed, Emergency Drugs available and Suction available Patient Re-evaluated:Patient Re-evaluated prior to induction Oxygen Delivery Method: Circle system utilized Preoxygenation: Pre-oxygenation with 100% oxygen Induction Type: IV induction Ventilation: Mask ventilation with difficulty, Two handed mask ventilation required and Oral airway inserted - appropriate to patient size Laryngoscope Size: McGraph and 4 Grade View: Grade III Tube type: Oral Tube size: 7.5 mm Number of attempts: 2 Airway Equipment and Method: Stylet and Video-laryngoscopy Placement Confirmation: ETT inserted through vocal cords under direct vision,  positive ETCO2 and breath sounds checked- equal and bilateral Secured at: 25 cm Tube secured with: Tape Dental Injury: Teeth and Oropharynx as per pre-operative assessment  Difficulty Due To: Difficulty was anticipated, Difficult Airway- due to reduced neck mobility and Difficult Airway- due to anterior larynx Future Recommendations: Recommend- induction with short-acting agent, and alternative techniques readily available

## 2017-12-08 NOTE — ED Notes (Signed)
Fluid from left knee obtained by EDP walked down to lab by this RN

## 2017-12-08 NOTE — ED Provider Notes (Signed)
[facility] Emergency Department Provider Note  ____________________________________________  Time seen: Approximately 8:27 AM  I have reviewed the triage vital signs and the nursing notes.   HISTORY  Chief Complaint Loss of Consciousness and Emesis   HPI Nathan Chambers is a 52 y.o. male with a history of chronic kidney disease, hypertension, hyperlipidemia, bipolar, anxiety who presents for evaluation of left knee pain and dehydration.  Patient reports that 2 days ago he had a fall in his yard.  He fell on top of his right knee but twisted his left knee.  He underwent a total knee replacement on that knee in 2017.  He reports waking up yesterday feeling very tired and weak.  He had significant pain on his knee.  He tried oxycodone 5 mg with no relief.  He reports that he spent most of the day in bed.  He reports having decreased appetite and not eating or drinking yesterday.  This morning when he woke up his tongue felt very dry and he was concerned that he was dehydrated.  He reports being hospitalized once in the past with acute kidney failure from dehydration and he was afraid that if he did not come to the hospital today to be evaluated that that would happen again.  He reports that he has been dry heaving since this morning and had one episode of watery diarrhea.  He denies fever, chills, abdominal pain, chest pain or shortness of breath.  Patient reports that for the last year he has been having episodes of syncope.  Initially once every 2 to 3 months but over the last month that he has had 4 episodes.  He reports that these episodes are usually preceded by feeling dizzy and tunnel vision.  He will faint and be out for a few seconds.  No seizure-like activity.  He is currently working with his neurologist and primary care doctor to try to figure it out why he is having these episodes.  His last syncopal event was 2 weeks ago.  These episodes happen at rest or with  exertion.  No known triggers.  Patient reports on his most recent episode he heard a voice telling him to get up.  This is the only time that he heard a voice during these episodes.  Past Medical History:  Diagnosis Date  . Anxiety   . Arthritis    knees, hands  . Bipolar disorder ([place])   . Chronic kidney disease    arf 2014 due to dehydration  . Headache   . Hemorrhoids   . Hyperlipidemia   . Hypertension   . LFT elevation   . NAFLD (nonalcoholic fatty liver disease)   . Sleep apnea     Patient Active Problem List   Diagnosis Date Noted  . Bilateral primary osteoarthritis of knee 04/19/2016    Past Surgical History:  Procedure Laterality Date  . ACHILLES TENDON SURGERY Right 10/30/2015   Procedure: ACHILLES TENDON REPAIR/ HEGLAND OSTECTOMY;  Surgeon: [name], DPM;  Location: [facility] ORS;  Service: Podiatry;  Laterality: Right;  . COLONOSCOPY    . COLONOSCOPY WITH PROPOFOL N/A 04/07/2017   Procedure: COLONOSCOPY WITH PROPOFOL;  Surgeon: Toledo, Benay Pike, MD;  Location: [facility] ENDOSCOPY;  Service: Gastroenterology;  Laterality: N/A;  . FRACTURE SURGERY Left    MIDDLE FINGER  . HERNIA REPAIR    . JOINT REPLACEMENT    . LIPOMA EXCISION    . TOTAL KNEE ARTHROPLASTY Bilateral 04/19/2016   Procedure: TOTAL  KNEE BILATERAL;  Surgeon: Hessie Knows, MD;  Location: [facility] ORS;  Service: Orthopedics;  Laterality: Bilateral;  . VASECTOMY      Prior to Admission medications   Medication Sig Start Date End Date Taking? Authorizing Provider  Alpha-Lipoic Acid 600 MG CAPS Take 600 mg by mouth daily. 05/25/17  Yes [provider]  ALPRAZolam Duanne Moron) 0.25 MG tablet Take 0.5-1 tablets by mouth every 12 (twelve) hours as needed for anxiety. 11/22/17  Yes [provider]  amLODipine-benazepril (LOTREL) 10-40 MG capsule Take 1 capsule by mouth daily. 12/06/17  Yes [provider]  aspirin 81 MG tablet Take 81 mg by mouth daily.   Yes [provider]  busPIRone  (BUSPAR) 5 MG tablet Take 5 mg by mouth 2 (two) times daily.   Yes [provider]  chlorthalidone (HYGROTON) 25 MG tablet Take 12.5 mg by mouth daily. 11/04/17  Yes [provider]  folic acid (FOLVITE) 1 MG tablet Take 1 mg by mouth daily.    Yes [provider]  gabapentin (NEURONTIN) 600 MG tablet Take 1,200 mg by mouth 3 (three) times daily.   Yes [provider]  gemfibrozil (LOPID) 600 MG tablet Take 600 mg by mouth 2 (two) times daily before a meal.   Yes [provider]  meloxicam (MOBIC) 15 MG tablet Take 15 mg by mouth daily. 09/10/17  Yes [provider]  multivitamin-lutein (OCUVITE-LUTEIN) CAPS capsule Take 1 capsule by mouth daily.   Yes [provider]  naloxone (NARCAN) 0.4 MG/ML injection Inject 1 mL (0.4 mg total) into the vein as needed (respiratory rate < 10). 04/22/16  Yes Duanne Guess, PA-C  oxyCODONE (OXY IR/ROXICODONE) 5 MG immediate release tablet Take 1-2 tablets (5-10 mg total) by mouth every 3 (three) hours as needed for breakthrough pain. Patient taking differently: Take 5-10 mg by mouth daily as needed for moderate pain or breakthrough pain.  04/22/16  Yes Duanne Guess, PA-C  PARoxetine (PAXIL) 20 MG tablet Take 60 mg by mouth daily.    Yes [provider]  pravastatin (PRAVACHOL) 20 MG tablet Take 20 mg by mouth every evening.    Yes [provider]  rOPINIRole (REQUIP) 0.5 MG tablet Take 0.5 mg by mouth at bedtime. 09/20/17  Yes [provider]  vitamin B-12 (CYANOCOBALAMIN) 1000 MCG tablet Take 1,000 mcg by mouth daily.    Yes [provider]  vitamin C (ASCORBIC ACID) 500 MG tablet Take 500 mg by mouth daily.   Yes [provider]  amLODipine (NORVASC) 10 MG tablet Take 1 tablet (10 mg total) by mouth daily. Patient not taking: Reported on 12/08/2017 04/22/16   Duanne Guess, PA-C  enoxaparin (LOVENOX) 40 MG/0.4ML injection Inject 0.4 mLs (40 mg total)  into the skin daily. Patient not taking: Reported on 04/07/2017 04/22/16   Duanne Guess, PA-C    Allergies Etodolac  No family history on file.  Social History Social History   Tobacco Use  . Smoking status: Never Smoker  . Smokeless tobacco: Former Systems developer    Types: Chew  Substance Use Topics  . Alcohol use: No  . Drug use: No    Review of Systems  Constitutional: Negative for fever. + syncope Eyes: Negative for visual changes. ENT: Negative for sore throat. Neck: No neck pain  Cardiovascular: Negative for chest pain. Respiratory: Negative for shortness of breath. Gastrointestinal: Negative for abdominal pain, vomiting. + nausea, dry heave, diarrhea Genitourinary: Negative for dysuria. Musculoskeletal: Negative  for back pain. + L knee pain Skin: Negative for rash. Neurological: Negative for headaches, weakness or numbness. Psych: No SI or HI  ____________________________________________   PHYSICAL EXAM:  VITAL SIGNS: ED Triage Vitals [12/08/17 0747]  Enc Vitals Group     BP 124/64     Pulse Rate 100     Resp 18     Temp 98.7 F (37.1 C)     Temp Source Oral     SpO2 94 %     Weight (!) 335 lb (152 kg)     Height 6\' 3"  (1.905 m)     Head Circumference      Peak Flow      Pain Score 10     Pain Loc      Pain Edu?      Excl. in [place]?     Constitutional: Alert and oriented. Well appearing and in no apparent distress. HEENT:      Head: Normocephalic and atraumatic.         Eyes: Conjunctivae are normal. Sclera is non-icteric.       Mouth/Throat: Mucous membranes are moist.       Neck: Supple with no signs of meningismus. Cardiovascular: Regular rate and rhythm. No murmurs, gallops, or rubs. 2+ symmetrical distal pulses are present in all extremities. No JVD. Respiratory: Normal respiratory effort. Lungs are clear to auscultation bilaterally. No wheezes, crackles, or rhonchi.  Gastrointestinal: Soft, non tender, and non distended with positive bowel  sounds. No rebound or guarding. Musculoskeletal: Diffuse tenderness to palpation of the left knee with a limited range of motion due to pain, no warmth or erythema. Neurologic: Normal speech and language. Face is symmetric. Moving all extremities. No gross focal neurologic deficits are appreciated. Skin: Skin is warm, dry and intact. No rash noted. Psychiatric: Mood and affect are normal. Speech and behavior are normal.  ____________________________________________   LABS (all labs ordered are listed, but only abnormal results are displayed)  Labs Reviewed  CBC - Abnormal; Notable for the following components:      Result Value   WBC 20.1 (*)    All other components within normal limits  BASIC METABOLIC PANEL - Abnormal; Notable for the following components:   Glucose, Bld 168 (*)    BUN 21 (*)    Creatinine, Ser 1.95 (*)    GFR calc non Af Amer 37 (*)    GFR calc Af Amer 43 (*)    All other components within normal limits  URINALYSIS, COMPLETE (UACMP) WITH MICROSCOPIC - Abnormal; Notable for the following components:   Color, Urine AMBER (*)    APPearance HAZY (*)    Protein, ur 100 (*)    All other components within normal limits  SYNOVIAL CELL COUNT + DIFF, W/ CRYSTALS - Abnormal; Notable for the following components:   Color, Synovial YELLOW (*)    Appearance-Synovial CLOUDY (*)    WBC, Synovial 53,293 (*)    All other components within normal limits  BODY FLUID CULTURE  GRAM STAIN  TROPONIN I   ____________________________________________  EKG  ED ECG REPORT I, Rudene Re, the attending physician, personally viewed and interpreted this ECG.  Normal sinus rhythm, normal intervals, RAD, no STE or depressions, no evidence of HOCM, AV block, delta wave, ARVD, prolonged QTc, WPW, or [place].   ____________________________________________  RADIOLOGY  I have personally reviewed the images performed during this visit and I agree with the Radiologist's  read.   Interpretation by Radiologist:  Dg  Knee Complete 4 Views Left  Result Date: 12/08/2017 CLINICAL DATA:  Pain following fall EXAM: LEFT KNEE - COMPLETE 4+ VIEW COMPARISON:  April 19, 2016 FINDINGS: Frontal, lateral, and bilateral oblique views were obtained. There is a total knee replacement with femoral and tibial prosthetic components well-seated. No acute fracture or dislocation is evident. There is a large joint effusion. There is spurring along the anterior patella. There is proximal tibiofibular syndesmosis with arthropathy in this area. There is an old healed fracture of the proximal fibular diaphysis. IMPRESSION: Total knee replacement with prosthetic components well-seated. Old healed fracture proximal fibula with proximal tibia-fibular syndesmosis. No acute fracture or dislocation evident. Large joint effusion. Traumatic etiology for this joint effusion must be questioned. Electronically Signed   By: Lowella Grip III M.D.   On: 12/08/2017 09:22      ____________________________________________   PROCEDURES  Procedure(s) performed:yes .Joint Aspiration/Arthrocentesis Date/Time: 12/08/2017 10:21 AM Performed by: Rudene Re, MD Authorized by: Rudene Re, MD   Consent:    Consent obtained:  Written   Consent given by:  Patient   Risks discussed:  Bleeding, infection, pain and incomplete drainage Location:    Location:  Knee   Knee:  L knee Anesthesia (see MAR for exact dosages):    Anesthesia method:  None Procedure details:    Preparation: Patient was prepped and draped in usual sterile fashion     Needle gauge:  22 G   Ultrasound guidance: yes     Approach:  Medial   Aspirate amount:  5   Aspirate characteristics:  Cloudy   Steroid injected: no     Specimen collected: yes   Post-procedure details:    Dressing:  Adhesive bandage   Patient tolerance of procedure:  Tolerated well, no immediate complications   Critical Care performed:  yes  CRITICAL CARE Performed by: Rudene Re  ?  Total critical care time: 30 min  Critical care time was exclusive of separately billable procedures and treating other patients.  Critical care was necessary to treat or prevent imminent or life-threatening deterioration.  Critical care was time spent personally by me on the following activities: development of treatment plan with patient and/or surrogate as well as nursing, discussions with consultants, evaluation of patient's response to treatment, examination of patient, obtaining history from patient or surrogate, ordering and performing treatments and interventions, ordering and review of laboratory studies, ordering and review of radiographic studies, pulse oximetry and re-evaluation of patient's condition.   ____________________________________________   INITIAL IMPRESSION / ASSESSMENT AND PLAN / ED COURSE  52 y.o. male with a history of chronic kidney disease, hypertension, hyperlipidemia, bipolar, anxiety who presents for evaluation of left knee pain and dehydration.   Patient is well-appearing, no distress, he has normal vital signs, neurologically intact, heart regular rate and rhythm with no murmurs.  # knee pain: h/o TKR, twisted 2 days ago, now with pain.  No signs or symptoms of septic joint.  No obvious deformities.  Will get an x-ray.  If negative patient has a knee immobilizer and crutches at home.  Will recommend follow-up with orthopedics.  Will give a dose of morphine for pain.  # concerns for dehydration: We will give Zofran for nausea and fluids, will check labs to rule out AKI or electrolyte abnormalities.  Will get UA to rule out ketonuria  # syncope: Unclear etiology of patient's syncopal events however with the frequency that they have been occurring I believe patient warrants cardiac work-up.  An EKG was  done here showing no evidence of dysrhythmias.  Will monitor on telemetry.  Will check labs to rule  out anemia or AKI as possible etiologies however less likely since this is an ongoing issue for over a year.  If work-up here is negative we will recommend the patient follows up with cardiology for Holter monitor.  Clinical Course as of Dec 09 1311  Fri Dec 08, 2017  1022 Labs showing mild AKI with creatinine of 1.95 (baseline of 1.3).  Also showing leukocytosis with white count of 20K which could be due to to stress related from dehydration and also the fact the patient has had nausea and diarrhea this morning however due to the significant swelling and joint effusion seen on x-ray I cannot rule out a septic joint at this time.  Arthrocentesis was performed with cloudy fluid collected.  Fluid was sent to lab for analysis which is now pending.   [CV]    Clinical Course User Index [CV] Alfred Levins, [place], MD   _________________________ 1:12 PM on 12/08/2017 -----------------------------------------  Arthrocentesis fluid analysis concerning for possible septic joint with 54,000 WBCs and 48% polys, no crystals seen.  Discussed with Dr. Marry Guan who is on-call for orthopedics who recommended admission for IV antibiotics and possible OR washout.  Gram staining and culture are pending.  Discussed with Dr. Jerelyn Charles for admission.   As part of my medical decision making, I reviewed the following data within the [place] notes reviewed and incorporated, Labs reviewed , EKG interpreted , Old EKG reviewed, Old chart reviewed, Radiograph reviewed , Discussed with admitting physician , A consult was requested and obtained from this/these consultant(s) Ortho, Notes from prior ED visits and [place] Controlled Substance Database    Pertinent labs & imaging results that were available during my care of the patient were reviewed by me and considered in my medical decision making (see chart for details).    ____________________________________________   FINAL CLINICAL IMPRESSION(S) / ED  DIAGNOSES  Final diagnoses:  Pyogenic arthritis of left knee joint, due to unspecified organism [facility])      NEW MEDICATIONS STARTED DURING THIS VISIT:  ED Discharge Orders    None       Note:  This document was prepared using Dragon voice recognition software and may include unintentional dictation errors.    Alfred Levins, [place], MD 12/08/17 [phone]

## 2017-12-08 NOTE — H&P (Signed)
[facility] [place] NAME: Nathan Chambers    MR#:  [id number]  DATE OF BIRTH:  1965-08-25  DATE OF ADMISSION:  12/08/2017  PRIMARY CARE PHYSICIAN: Juluis Pitch, MD   REQUESTING/REFERRING PHYSICIAN:   CHIEF COMPLAINT:   Chief Complaint  Patient presents with  . Loss of Consciousness  . Emesis    HISTORY OF PRESENT ILLNESS: Nathan Chambers  is a 52 y.o. male with a known history per below, history of frequent syncopal events-extensive work-up done in the past which have been unfruitful, status post knee replacement in December 2017, presenting to the emergency room s/p recurrent syncopal episode on Wednesday resulting in twisting of his left knee, had fallen to the ground, subsequent left knee pain, has been complaining of generalized weakness, dry heaves, last week patient was cutting grass with a weedeater and nicked the lower part of his left leg causing a wound, in the emergency room patient was found to have hot swollen left knee-status post arthrocentesis by ED attending, fluid consistent with bacterial infection with white count greater than 53,000, patient started on empiric antibiotics, orthopedic surgery did see patient in the emergency room and is planning washout procedure later today, patient is now be admitted to regular nursing for bed for acute left septic knee compounded by s/p TKR.   PAST MEDICAL HISTORY:   Past Medical History:  Diagnosis Date  . Anxiety   . Arthritis    knees, hands  . Bipolar disorder ([place])   . Chronic kidney disease    arf 2014 due to dehydration  . Headache   . Hemorrhoids   . Hyperlipidemia   . Hypertension   . LFT elevation   . NAFLD (nonalcoholic fatty liver disease)   . Sleep apnea     PAST SURGICAL HISTORY:  Past Surgical History:  Procedure Laterality Date  . ACHILLES TENDON SURGERY Right 10/30/2015   Procedure: ACHILLES TENDON REPAIR/ HEGLAND OSTECTOMY;  Surgeon: [name], DPM;   Location: [facility] ORS;  Service: Podiatry;  Laterality: Right;  . COLONOSCOPY    . COLONOSCOPY WITH PROPOFOL N/A 04/07/2017   Procedure: COLONOSCOPY WITH PROPOFOL;  Surgeon: Toledo, Benay Pike, MD;  Location: [facility] ENDOSCOPY;  Service: Gastroenterology;  Laterality: N/A;  . FRACTURE SURGERY Left    MIDDLE FINGER  . HERNIA REPAIR    . JOINT REPLACEMENT    . LIPOMA EXCISION    . TOTAL KNEE ARTHROPLASTY Bilateral 04/19/2016   Procedure: TOTAL KNEE BILATERAL;  Surgeon: Hessie Knows, MD;  Location: [facility] ORS;  Service: Orthopedics;  Laterality: Bilateral;  . VASECTOMY      SOCIAL HISTORY:  Social History   Tobacco Use  . Smoking status: Never Smoker  . Smokeless tobacco: Former Systems developer    Types: Chew  Substance Use Topics  . Alcohol use: No    FAMILY HISTORY: No family history on file.  DRUG ALLERGIES:  Allergies  Allergen Reactions  . Etodolac Itching    REVIEW OF SYSTEMS:   CONSTITUTIONAL: No fever, fatigue or weakness.  EYES: No blurred or double vision.  EARS, NOSE, AND THROAT: No tinnitus or ear pain.  RESPIRATORY: No cough, shortness of breath, wheezing or hemoptysis.  CARDIOVASCULAR: No chest pain, orthopnea, edema.  GASTROINTESTINAL: No nausea, vomiting, diarrhea or abdominal pain.  GENITOURINARY: No dysuria, hematuria.  ENDOCRINE: No polyuria, nocturia,  HEMATOLOGY: No anemia, easy bruising or bleeding SKIN: No rash or lesion. MUSCULOSKELETAL: Left knee pain, swelling  nEUROLOGIC: No tingling, numbness, weakness.  PSYCHIATRY: No anxiety or depression.   MEDICATIONS AT HOME:  Prior to Admission medications   Medication Sig Start Date End Date Taking? Authorizing Provider  Alpha-Lipoic Acid 600 MG CAPS Take 600 mg by mouth daily. 05/25/17  Yes [provider]  ALPRAZolam Duanne Moron) 0.25 MG tablet Take 0.5-1 tablets by mouth every 12 (twelve) hours as needed for anxiety. 11/22/17  Yes [provider]  amLODipine-benazepril (LOTREL) 10-40 MG capsule Take 1  capsule by mouth daily. 12/06/17  Yes [provider]  aspirin 81 MG tablet Take 81 mg by mouth daily.   Yes [provider]  busPIRone (BUSPAR) 5 MG tablet Take 5 mg by mouth 2 (two) times daily.   Yes [provider]  chlorthalidone (HYGROTON) 25 MG tablet Take 12.5 mg by mouth daily. 11/04/17  Yes [provider]  folic acid (FOLVITE) 1 MG tablet Take 1 mg by mouth daily.    Yes [provider]  gabapentin (NEURONTIN) 600 MG tablet Take 1,200 mg by mouth 3 (three) times daily.   Yes [provider]  gemfibrozil (LOPID) 600 MG tablet Take 600 mg by mouth 2 (two) times daily before a meal.   Yes [provider]  meloxicam (MOBIC) 15 MG tablet Take 15 mg by mouth daily. 09/10/17  Yes [provider]  multivitamin-lutein (OCUVITE-LUTEIN) CAPS capsule Take 1 capsule by mouth daily.   Yes [provider]  naloxone (NARCAN) 0.4 MG/ML injection Inject 1 mL (0.4 mg total) into the vein as needed (respiratory rate < 10). 04/22/16  Yes Duanne Guess, PA-C  oxyCODONE (OXY IR/ROXICODONE) 5 MG immediate release tablet Take 1-2 tablets (5-10 mg total) by mouth every 3 (three) hours as needed for breakthrough pain. Patient taking differently: Take 5-10 mg by mouth daily as needed for moderate pain or breakthrough pain.  04/22/16  Yes Duanne Guess, PA-C  PARoxetine (PAXIL) 20 MG tablet Take 60 mg by mouth daily.    Yes [provider]  pravastatin (PRAVACHOL) 20 MG tablet Take 20 mg by mouth every evening.    Yes [provider]  rOPINIRole (REQUIP) 0.5 MG tablet Take 0.5 mg by mouth at bedtime. 09/20/17  Yes [provider]  vitamin B-12 (CYANOCOBALAMIN) 1000 MCG tablet Take 1,000 mcg by mouth daily.    Yes [provider]  vitamin C (ASCORBIC ACID) 500 MG tablet Take 500 mg by mouth daily.   Yes [provider]      PHYSICAL EXAMINATION:   VITAL SIGNS: Blood pressure 116/65, pulse  100, temperature 98.7 F (37.1 C), temperature source Oral, resp. rate (!) 27, height 6\' 3"  (1.905 m), weight (!) 152 kg, SpO2 98 %.  GENERAL:  52 y.o.-year-old patient lying in the bed with no acute distress.  Extreme morbid obesity EYES: Pupils equal, round, reactive to light and accommodation. No scleral icterus. Extraocular muscles intact.  HEENT: Head atraumatic, normocephalic. Oropharynx and nasopharynx clear.  NECK:  Supple, no jugular venous distention. No thyroid enlargement, no tenderness.  LUNGS: Normal breath sounds bilaterally, no wheezing, rales,rhonchi or crepitation. No use of accessory muscles of respiration.  CARDIOVASCULAR: S1, S2 normal. No murmurs, rubs, or gallops.  ABDOMEN: Soft, nontender, nondistended. Bowel sounds present. No organomegaly or mass.  EXTREMITIES: Left knee swellings, warm to touch, old knee replacement scar noted   NEUROLOGIC: Cranial nerves II through XII are intact. Muscle strength 5/5 in all extremities. Sensation intact. Gait not checked.  PSYCHIATRIC: The patient is alert and oriented x  3.  SKIN: No obvious rash, lesion, or ulcer.   LABORATORY PANEL:   CBC Recent Labs  Lab 12/08/17 0803  WBC 20.1*  HGB 15.8  HCT 45.1  PLT 319  MCV 92.4  MCH 32.3  MCHC 34.9  RDW 14.0   ------------------------------------------------------------------------------------------------------------------  Chemistries  Recent Labs  Lab 12/08/17 0803  NA 136  K 3.5  CL 99  CO2 24  GLUCOSE 168*  BUN 21*  CREATININE 1.95*  CALCIUM 9.2   ------------------------------------------------------------------------------------------------------------------ estimated creatinine clearance is 68.3 mL/min (A) (by C-G formula based on SCr of 1.95 mg/dL (H)). ------------------------------------------------------------------------------------------------------------------ No results for input(s): TSH, T4TOTAL, T3FREE, THYROIDAB in the last 72 hours.  Invalid  input(s): FREET3   Coagulation profile No results for input(s): INR, PROTIME in the last 168 hours. ------------------------------------------------------------------------------------------------------------------- No results for input(s): DDIMER in the last 72 hours. -------------------------------------------------------------------------------------------------------------------  Cardiac Enzymes Recent Labs  Lab 12/08/17 0803  TROPONINI <0.03   ------------------------------------------------------------------------------------------------------------------ Invalid input(s): POCBNP  ---------------------------------------------------------------------------------------------------------------  Urinalysis    Component Value Date/Time   COLORURINE AMBER (A) 12/08/2017 1140   APPEARANCEUR HAZY (A) 12/08/2017 1140   APPEARANCEUR Cloudy 01/05/2013 1207   LABSPEC 1.023 12/08/2017 1140   LABSPEC 1.020 01/05/2013 1207   PHURINE 5.0 12/08/2017 1140   GLUCOSEU NEGATIVE 12/08/2017 1140   GLUCOSEU 50 mg/dL 01/05/2013 1207   HGBUR NEGATIVE 12/08/2017 1140   BILIRUBINUR NEGATIVE 12/08/2017 1140   BILIRUBINUR Negative 01/05/2013 1207   KETONESUR NEGATIVE 12/08/2017 1140   PROTEINUR 100 (A) 12/08/2017 1140   NITRITE NEGATIVE 12/08/2017 1140   LEUKOCYTESUR NEGATIVE 12/08/2017 1140   LEUKOCYTESUR Negative 01/05/2013 1207     RADIOLOGY: Dg Chest 2 View  Result Date: 12/08/2017 CLINICAL DATA:  Chest pain, shortness of breath. Preop for knee surgery. EXAM: CHEST - 2 VIEW COMPARISON:  None. FINDINGS: The heart size and mediastinal contours are within normal limits. Both lungs are clear. No pneumothorax or pleural effusion is noted. The visualized skeletal structures are unremarkable. IMPRESSION: No active cardiopulmonary disease. Electronically Signed   By: Marijo Conception, M.D.   On: 12/08/2017 13:57   Dg Knee Complete 4 Views Left  Result Date: 12/08/2017 CLINICAL DATA:  Pain following  fall EXAM: LEFT KNEE - COMPLETE 4+ VIEW COMPARISON:  April 19, 2016 FINDINGS: Frontal, lateral, and bilateral oblique views were obtained. There is a total knee replacement with femoral and tibial prosthetic components well-seated. No acute fracture or dislocation is evident. There is a large joint effusion. There is spurring along the anterior patella. There is proximal tibiofibular syndesmosis with arthropathy in this area. There is an old healed fracture of the proximal fibular diaphysis. IMPRESSION: Total knee replacement with prosthetic components well-seated. Old healed fracture proximal fibula with proximal tibia-fibular syndesmosis. No acute fracture or dislocation evident. Large joint effusion. Traumatic etiology for this joint effusion must be questioned. Electronically Signed   By: Lowella Grip III M.D.   On: 12/08/2017 09:22    EKG: Orders placed or performed during the hospital encounter of 12/08/17  . EKG 12-Lead  . EKG 12-Lead    IMPRESSION AND PLAN: *Acute left septic knee Compounded by history of TKR Admit to regular nursing for bed, orthopedic surgery following-plans for washout later today with antibiotic bead placement, n.p.o. for now, IV fluids for rehydration, empiric vancomycin/Rocephin/Flagyl for now, follow-up wound arthrocentesis cultures/sensitivities, infectious disease consultation for expert opinion  *Chronic benign essential hypertension Stable Continue home regiment  *Chronic bipolar illness Stable Continue home psychotropic regiment  *Chronic kidney disease stage III  Stable-at baseline Avoid nephrotoxic agents  *Chronic hyperlipidemia, unspecified Stable Continue statin therapy  All the records are reviewed and case discussed with ED provider. Management plans discussed with the patient, family and they are in agreement.  CODE STATUS:full Code Status History    Date Active Date Inactive Code Status Order ID Comments User Context    04/19/2016 1346 04/22/2016 2102 Full Code [id number]  Hessie Knows, MD Inpatient   10/30/2015 1136 10/30/2015 1524 Full Code [id number]  [name], DPM Inpatient       TOTAL TIME TAKING CARE OF THIS PATIENT: 45 minutes.    Avel Peace Salary M.D on 12/08/2017   Between 7am to 6pm - Pager - [phone]  After 6pm go to [email or web address] - password [email or web address] [facility]  Office  [phone]  CC: Primary care physician; Juluis Pitch, MD   Note: This dictation was prepared with Dragon dictation along with smaller phrase technology. Any transcriptional errors that result from this process are unintentional.

## 2017-12-08 NOTE — ED Notes (Signed)
Requested patient to urinate. Left urinal. States he will attempt when he has urge.

## 2017-12-08 NOTE — ED Notes (Addendum)
Report to Janie, RN.

## 2017-12-08 NOTE — Transfer of Care (Signed)
Immediate Anesthesia Transfer of Care Note  Patient: Nathan Chambers  Procedure(s) Performed: KNEE ARTHROTOMY, I&D, [place] OUT, POLY EXCHANGE (Left Knee) IRRIGATION AND DEBRIDEMENT KNEE WITH POLY EXCHANGE (Left Knee)  Patient Location: PACU  Anesthesia Type:General  Level of Consciousness: awake and sedated  Airway & Oxygen Therapy: Patient Spontanous Breathing and Patient connected to face mask oxygen  Post-op Assessment: Report given to RN and Post -op Vital signs reviewed and stable  Post vital signs: Reviewed and stable  Last Vitals:  Vitals Value Taken Time  BP 160/117 12/08/2017  9:55 PM  Temp 36.9 C 12/08/2017  9:55 PM  Pulse 110 12/08/2017 10:01 PM  Resp 16 12/08/2017 10:01 PM  SpO2 82 % 12/08/2017 10:01 PM  Vitals shown include unvalidated device data.  Last Pain:  Vitals:   12/08/17 2155  TempSrc:   PainSc: 0-No pain         Complications: No apparent anesthesia complications

## 2017-12-08 NOTE — ED Notes (Signed)
Patient transported to CT 

## 2017-12-08 NOTE — ED Notes (Signed)
Dr Hooten at bedside 

## 2017-12-08 NOTE — Anesthesia Preprocedure Evaluation (Signed)
Anesthesia Evaluation  Patient identified by MRN, date of birth, ID band Patient awake    Reviewed: Allergy & Precautions, NPO status , Patient's Chart, lab work & pertinent test results  History of Anesthesia Complications Negative for: history of anesthetic complications  Airway Mallampati: III  TM Distance: >3 FB     Dental  (+) Chipped, Dental Advidsory Given, Poor Dentition   Pulmonary neg pulmonary ROS,    Pulmonary exam normal        Cardiovascular Exercise Tolerance: Good hypertension, Pt. on medications (-) angina(-) CAD, (-) Past MI, (-) Cardiac Stents and (-) CABG Normal cardiovascular exam(-) dysrhythmias (-) Valvular Problems/Murmurs     Neuro/Psych  Headaches, neg Seizures PSYCHIATRIC DISORDERS Anxiety Bipolar Disorder    GI/Hepatic negative GI ROS, Neg liver ROS, Non-alcoholic fatty liver   Endo/Other  neg diabetesMorbid obesity  Renal/GU Renal InsufficiencyRenal disease  negative genitourinary   Musculoskeletal  (+) Arthritis ,   Abdominal   Peds negative pediatric ROS (+)  Hematology negative hematology ROS (+)   Anesthesia Other Findings Past Medical History: No date: Anxiety No date: Arthritis     Comment: knees, hands No date: Bipolar disorder ([facility]) No date: Chronic kidney disease     Comment: arf 2014 due to dehydration No date: Headache No date: Hemorrhoids No date: Hyperlipidemia No date: Hypertension No date: LFT elevation No date: NAFLD (nonalcoholic fatty liver disease)  Reproductive/Obstetrics                             Anesthesia Physical  Anesthesia Plan  ASA: III  Anesthesia Plan: General   Post-op Pain Management:    Induction: Intravenous  PONV Risk Score and Plan: Ondansetron, Dexamethasone and Midazolam  Airway Management Planned: Oral ETT  Additional Equipment:   Intra-op Plan:   Post-operative Plan: Extubation in OR  Informed  Consent: I have reviewed the patients History and Physical, chart, labs and discussed the procedure including the risks, benefits and alternatives for the proposed anesthesia with the patient or authorized representative who has indicated his/her understanding and acceptance.   Dental advisory given  Plan Discussed with: CRNA and Surgeon  Anesthesia Plan Comments:         Anesthesia Quick Evaluation

## 2017-12-08 NOTE — ED Notes (Signed)
ED Provider at bedside. 

## 2017-12-09 LAB — CBC WITH DIFFERENTIAL/PLATELET
BASOS ABS: 0.1 10*3/uL (ref 0–0.1)
Basophils Relative: 0 %
EOS ABS: 0 10*3/uL (ref 0–0.7)
EOS PCT: 0 %
HEMATOCRIT: 37 % — AB (ref 40.0–52.0)
Hemoglobin: 12.8 g/dL — ABNORMAL LOW (ref 13.0–18.0)
Lymphocytes Relative: 5 %
Lymphs Abs: 0.8 10*3/uL — ABNORMAL LOW (ref 1.0–3.6)
MCH: 32.3 pg (ref 26.0–34.0)
MCHC: 34.5 g/dL (ref 32.0–36.0)
MCV: 93.8 fL (ref 80.0–100.0)
MONOS PCT: 8 %
Monocytes Absolute: 1.3 10*3/uL — ABNORMAL HIGH (ref 0.2–1.0)
NEUTROS ABS: 14.3 10*3/uL — AB (ref 1.4–6.5)
Neutrophils Relative %: 87 %
PLATELETS: 234 10*3/uL (ref 150–440)
RBC: 3.95 MIL/uL — ABNORMAL LOW (ref 4.40–5.90)
RDW: 13.8 % (ref 11.5–14.5)
WBC: 16.4 10*3/uL — ABNORMAL HIGH (ref 3.8–10.6)

## 2017-12-09 LAB — BASIC METABOLIC PANEL
ANION GAP: 11 (ref 5–15)
BUN: 27 mg/dL — AB (ref 6–20)
CO2: 27 mmol/L (ref 22–32)
CREATININE: 1.83 mg/dL — AB (ref 0.61–1.24)
Calcium: 8.4 mg/dL — ABNORMAL LOW (ref 8.9–10.3)
Chloride: 100 mmol/L (ref 98–111)
GFR calc Af Amer: 47 mL/min — ABNORMAL LOW (ref >60)
GFR calc non Af Amer: 40 mL/min — ABNORMAL LOW (ref >60)
Glucose, Bld: 133 mg/dL — ABNORMAL HIGH (ref 70–99)
Potassium: 3.5 mmol/L (ref 3.5–5.1)
Sodium: 138 mmol/L (ref 135–145)

## 2017-12-09 MED ORDER — CEFAZOLIN SODIUM-DEXTROSE 2-4 GM/100ML-% IV SOLN
2.0000 g | Freq: Three times a day (TID) | INTRAVENOUS | Status: DC
Start: 2017-12-09 — End: 2017-12-12
  Administered 2017-12-09 – 2017-12-12 (×9): 2 g via INTRAVENOUS
  Filled 2017-12-09 (×11): qty 100

## 2017-12-09 MED ORDER — ACETAMINOPHEN 10 MG/ML IV SOLN
1000.0000 mg | Freq: Four times a day (QID) | INTRAVENOUS | Status: AC
  Administered 2017-12-09: 1000 mg via INTRAVENOUS
  Filled 2017-12-09: qty 100

## 2017-12-09 NOTE — Progress Notes (Signed)
Dr Jerelyn Charles text paged about body fluid culture results.

## 2017-12-09 NOTE — Anesthesia Postprocedure Evaluation (Signed)
Anesthesia Post Note  Patient: BORIS ENGELMANN  Procedure(s) Performed: KNEE ARTHROTOMY, I&D, [place] OUT, POLY EXCHANGE (Left Knee) IRRIGATION AND DEBRIDEMENT KNEE WITH POLY EXCHANGE (Left Knee)  Patient location during evaluation: PACU Anesthesia Type: General Level of consciousness: awake and alert Pain management: pain level controlled Vital Signs Assessment: post-procedure vital signs reviewed and stable Respiratory status: spontaneous breathing, nonlabored ventilation, respiratory function stable and patient connected to nasal cannula oxygen Cardiovascular status: blood pressure returned to baseline and stable Postop Assessment: no apparent nausea or vomiting Anesthetic complications: no     Last Vitals:  Vitals:   12/08/17 2328 12/09/17 0003  BP: 118/79 99/78  Pulse: 94 86  Resp: 20 19  Temp: 36.5 C 36.9 C  SpO2: 91% 92%    Last Pain:  Vitals:   12/09/17 0003  TempSrc: Oral  PainSc:                  Martha Clan

## 2017-12-09 NOTE — Clinical Social Work Note (Signed)
CSW received consult for possible SNF placement. CSW will follow pending PT evaluation.  Samanthan Dugo Martha Aadarsh Cozort, MSW, LCSWA [phone] 

## 2017-12-09 NOTE — Progress Notes (Signed)
[place] [facility] [place] NAME: Nathan Chambers    MR#:  [id number]  DATE OF BIRTH:  [birth year]  SUBJECTIVE:  CHIEF COMPLAINT:   Chief Complaint  Patient presents with  . Loss of Consciousness  . Emesis  Cultures noted, patient without complaint, no events of my per nursing staff  REVIEW OF SYSTEMS:  CONSTITUTIONAL: No fever, fatigue or weakness.  EYES: No blurred or double vision.  EARS, NOSE, AND THROAT: No tinnitus or ear pain.  RESPIRATORY: No cough, shortness of breath, wheezing or hemoptysis.  CARDIOVASCULAR: No chest pain, orthopnea, edema.  GASTROINTESTINAL: No nausea, vomiting, diarrhea or abdominal pain.  GENITOURINARY: No dysuria, hematuria.  ENDOCRINE: No polyuria, nocturia,  HEMATOLOGY: No anemia, easy bruising or bleeding SKIN: No rash or lesion. MUSCULOSKELETAL: No joint pain or arthritis.   NEUROLOGIC: No tingling, numbness, weakness.  PSYCHIATRY: No anxiety or depression.   ROS  DRUG ALLERGIES:   Allergies  Allergen Reactions  . Etodolac Itching    VITALS:  Blood pressure 101/64, pulse 70, temperature 97.8 F (36.6 C), temperature source Oral, resp. rate 18, height 6\' 3"  (1.905 m), weight (!) 152 kg, SpO2 95 %.  PHYSICAL EXAMINATION:  GENERAL:  52 y.o.-year-old patient lying in the bed with no acute distress.  EYES: Pupils equal, round, reactive to light and accommodation. No scleral icterus. Extraocular muscles intact.  HEENT: Head atraumatic, normocephalic. Oropharynx and nasopharynx clear.  NECK:  Supple, no jugular venous distention. No thyroid enlargement, no tenderness.  LUNGS: Normal breath sounds bilaterally, no wheezing, rales,rhonchi or crepitation. No use of accessory muscles of respiration.  CARDIOVASCULAR: S1, S2 normal. No murmurs, rubs, or gallops.  ABDOMEN: Soft, nontender, nondistended. Bowel sounds present. No organomegaly or mass.  EXTREMITIES: No pedal edema, cyanosis, or clubbing.  NEUROLOGIC:  Cranial nerves II through XII are intact. Muscle strength 5/5 in all extremities. Sensation intact. Gait not checked.  PSYCHIATRIC: The patient is alert and oriented x 3.  SKIN: No obvious rash, lesion, or ulcer.   Physical Exam LABORATORY PANEL:   CBC Recent Labs  Lab 12/09/17 0346  WBC 16.4*  HGB 12.8*  HCT 37.0*  PLT 234   ------------------------------------------------------------------------------------------------------------------  Chemistries  Recent Labs  Lab 12/09/17 0346  NA 138  K 3.5  CL 100  CO2 27  GLUCOSE 133*  BUN 27*  CREATININE 1.83*  CALCIUM 8.4*   ------------------------------------------------------------------------------------------------------------------  Cardiac Enzymes Recent Labs  Lab 12/08/17 0803  TROPONINI <0.03   ------------------------------------------------------------------------------------------------------------------  RADIOLOGY:  Dg Chest 2 View  Result Date: 12/08/2017 CLINICAL DATA:  Chest pain, shortness of breath. Preop for knee surgery. EXAM: CHEST - 2 VIEW COMPARISON:  None. FINDINGS: The heart size and mediastinal contours are within normal limits. Both lungs are clear. No pneumothorax or pleural effusion is noted. The visualized skeletal structures are unremarkable. IMPRESSION: No active cardiopulmonary disease. Electronically Signed   By: Marijo Conception, M.D.   On: 12/08/2017 13:57   Dg Knee Complete 4 Views Left  Result Date: 12/08/2017 CLINICAL DATA:  Pain following fall EXAM: LEFT KNEE - COMPLETE 4+ VIEW COMPARISON:  April 19, 2016 FINDINGS: Frontal, lateral, and bilateral oblique views were obtained. There is a total knee replacement with femoral and tibial prosthetic components well-seated. No acute fracture or dislocation is evident. There is a large joint effusion. There is spurring along the anterior patella. There is proximal tibiofibular syndesmosis with arthropathy in this area. There is an old healed  fracture of the proximal fibular  diaphysis. IMPRESSION: Total knee replacement with prosthetic components well-seated. Old healed fracture proximal fibula with proximal tibia-fibular syndesmosis. No acute fracture or dislocation evident. Large joint effusion. Traumatic etiology for this joint effusion must be questioned. Electronically Signed   By: Lowella Grip III M.D.   On: 12/08/2017 09:22   Dg Knee Left Port  Result Date: 12/08/2017 CLINICAL DATA:  Status post left total knee replacement. Initial encounter. EXAM: PORTABLE LEFT KNEE - 1-2 VIEW COMPARISON:  Left knee radiographs performed earlier today at 8:35 a.m. FINDINGS: There is no evidence of fracture or dislocation. The patient's total knee arthroplasty is unremarkable in appearance, without evidence of loosening. Postoperative packing material and skin staples are noted, with associated drains. No significant joint effusion is seen. The visualized soft tissues are otherwise unremarkable in appearance. IMPRESSION: No evidence of fracture or dislocation. Total knee arthroplasty is unremarkable in appearance. Electronically Signed   By: Garald Balding M.D.   On: 12/08/2017 23:15    ASSESSMENT AND PLAN:  *Acute left septic knee Compounded by history of TKR Status post washout procedure on December 08, 2017 by orthopedic surgery with antibiotic bead placement, taper antibiotic regiment to IV vancomycin given cultures noted for staph aureus/gram-positive cocci, follow-up on outstanding cultures/sensitivities, infectious disease consult if expert opinion-Case discussed with Dr. Graylon Good earlier today   *Chronic benign essential hypertension Stable Continue home regiment  *Chronic bipolar illness Stable Continue home psychotropic regiment  *Chronic kidney disease stage III Stable-at baseline Avoid nephrotoxic agents  *Chronic hyperlipidemia, unspecified Stable Continue statin therapy   All the records are reviewed and case discussed  with Care Management/Social Workerr. Management plans discussed with the patient, family and they are in agreement.  CODE STATUS: full  TOTAL TIME TAKING CARE OF THIS PATIENT: 35 minutes.     POSSIBLE D/C IN 2-3 DAYS, DEPENDING ON CLINICAL CONDITION.   Avel Peace Salary M.D on 12/09/2017   Between 7am to 6pm - Pager - [phone]  After 6pm go to [email or web address] - password [email or web address] [facility]  Office  [phone]  CC: Primary care physician; Juluis Pitch, MD  Note: This dictation was prepared with Dragon dictation along with smaller phrase technology. Any transcriptional errors that result from this process are unintentional.

## 2017-12-09 NOTE — Consult Note (Signed)
Pharmacy Antibiotic Note  Nathan Chambers is a 52 y.o. male admitted on 12/08/2017 with prosthetic joint infection.  Pharmacy has been consulted for cefazolin dosing.  Plan: Ordered Cefazolin 2 grams every 8 hours.   Height: 6\' 3"  (190.5 cm) Weight: (!) 335 lb 1.6 oz (152 kg) IBW/kg (Calculated) : 84.5  Temp (24hrs), Avg:98.4 F (36.9 C), Min:97.7 F (36.5 C), Max:99.2 F (37.3 C)  Recent Labs  Lab 12/08/17 0803 12/09/17 0346  WBC 20.1* 16.4*  CREATININE 1.95* 1.83*    Estimated Creatinine Clearance: 72.8 mL/min (A) (by C-G formula based on SCr of 1.83 mg/dL (H)).    Allergies  Allergen Reactions  . Etodolac Itching    Antimicrobials this admission: Ceftriaxone 0808>>0809 Metronidazole 0809 >> 0810 Vancomycin 0809>>0810   Microbiology results: 0810 BCx: pending 0809 Wound cx: GPC/staph aureus 0809 MRSA PCR: negative  Thank you for allowing pharmacy to be a part of this patient's care.  Paticia Stack, PharmD Pharmacy Resident  12/09/2017 2:09 PM

## 2017-12-09 NOTE — Progress Notes (Signed)
  Subjective: 1 Day Post-Op Procedure(s) (LRB): KNEE ARTHROTOMY, I&D, WASH OUT, POLY EXCHANGE (Left) IRRIGATION AND DEBRIDEMENT KNEE WITH POLY EXCHANGE (Left) Patient reports pain as mild.   Patient is sleepy but responsive this morning. PT and care management to assist with discharge planning. Negative for chest pain and shortness of breath Fever: yes, 99.2 recently. Gastrointestinal:Negative for nausea and vomiting this morning.  Objective: Vital signs in last 24 hours: Temp:  [97.7 F (36.5 C)-99.2 F (37.3 C)] 97.7 F (36.5 C) (08/10 0053) Pulse Rate:  [84-126] 87 (08/10 0053) Resp:  [14-30] 20 (08/10 0053) BP: (99-167)/(60-112) 136/75 (08/10 0053) SpO2:  [81 %-98 %] 89 % (08/10 0053) Weight:  [152 kg] 152 kg (08/09 1522)  Intake/Output from previous day:  Intake/Output Summary (Last 24 hours) at 12/09/2017 0702 Last data filed at 12/09/2017 0618 Gross per 24 hour  Intake 3630 ml  Output 1085 ml  Net 2545 ml    Intake/Output this shift: No intake/output data recorded.  Labs: Recent Labs    12/08/17 0803 12/09/17 0346  HGB 15.8 12.8*   Recent Labs    12/08/17 0803 12/09/17 0346  WBC 20.1* 16.4*  RBC 4.88 3.95*  HCT 45.1 37.0*  PLT 319 234   Recent Labs    12/08/17 0803 12/09/17 0346  NA 136 138  K 3.5 3.5  CL 99 100  CO2 24 27  BUN 21* 27*  CREATININE 1.95* 1.83*  GLUCOSE 168* 133*  CALCIUM 9.2 8.4*   No results for input(s): LABPT, INR in the last 72 hours.   EXAM General - Patient is Alert, Appropriate and drowsy Extremity - ABD soft Sensation intact distally Intact pulses distally Dorsiflexion/Plantar flexion intact Incision: dressing C/D/I Dressing/Incision - clean, dry, no drainage.  Bulky dressing intact, hemovac intact to the left knee. Motor Function - intact, moving foot and toes well on exam.  Abdomen is soft with normal bowel sounds.  Past Medical History:  Diagnosis Date  . Anxiety   . Arthritis    knees, hands  . Bipolar  disorder ([place])   . Chronic kidney disease    arf 2014 due to dehydration  . Headache   . Hemorrhoids   . Hyperlipidemia   . Hypertension   . LFT elevation   . NAFLD (nonalcoholic fatty liver disease)   . Sleep apnea     Assessment/Plan: 1 Day Post-Op Procedure(s) (LRB): KNEE ARTHROTOMY, I&D, WASH OUT, POLY EXCHANGE (Left) IRRIGATION AND DEBRIDEMENT KNEE WITH POLY EXCHANGE (Left) Active Problems:   Arthritis, septic, knee (HCC)  Estimated body mass index is 41.88 kg/m as calculated from the following:   Height as of this encounter: 6\' 3"  (1.905 m).   Weight as of this encounter: 152 kg. Advance diet Up with therapy D/C IV fluids when tolerating po intake.  Labs reviewed this AM. WBC 16.4.  Hg 12.8 BUN 27, Cr 1.83. Will remove bulky dressing tomorrow and remove hemovac tomorrow. Has received Rocephin, Flagyl and Vancomycin. Infectious disease consult placed.   Cultures from the knee are pending, Gram stain demonstrate gram positive cocci. CBC and BMP ordered for tomorrow morning.  DVT Prophylaxis - Lovenox, Foot Pumps and TED hose Weight-Bearing as tolerated to left leg  J. Cameron Proud, PA-C [facility] Orthopaedic Surgery 12/09/2017, 7:02 AM

## 2017-12-09 NOTE — Progress Notes (Addendum)
ID PROGRESS NOTE   Dr Jerelyn Charles asked for prelim ID recommendations until patient can be seen formally/ in person on 8/12 by dr Lavena Stanford- from ID.   Mr Nathan Chambers is a 52yo M with hx of bipolar, CKD 3, bilateral TKA, admitted for worsening left knee pain, and swelling. Arthrocentesis on 8/9 showed 53K with 48%N from left synovial fluid with cultures growing GPCs- identified as staph aureus. The patient is not MRSA colonized. Dr Marry Guan, from orthopedics, took patient to OR on 8/9 for Ix D poly exchange and antibiotic bead placement. More cultures sent. Patient initally placed on vanco,ceftriaxone, and metronidazole due to lower extremity wound from gardening-concerning for mixed bacterial infection.  Patient is afebrile.  Labs/micro: Wbc of 16K down from 20K, Cr 1.8. Synovial fluid cx staph aureus -sensitivities pending  A/p: staph aureus prosthetic joint infection of left knee s/p DAIR procedure  - recommend to do 6 wk of IV abtx (for now will do cefazolin plus rifampin 300mg  BID) thereafter will need oral abtx x 4.5 months for treatment of PJI - will follow cx results to see if further antibiotic changes need to be done - will need central line, Cr precludes him for picc line in his arm  Foot wound = keep metronidazole in place for now  Drug interaction = will need to check if rifampin interacts with any of his medications and may consider temporarily hold his statin for 2 months while on rifampin to minimize drug interaction  Will have Dr Delaine Lame to see on Monday for final recs

## 2017-12-09 NOTE — Evaluation (Signed)
Physical Therapy Evaluation Patient Details Name: Nathan Chambers MRN: [id number] DOB: 1965-06-10 Today's Date: 12/09/2017   History of Present Illness  Patient is a 52 year old male admitted following irrigation and debridement for pyogenic arthritis of the L knee following c/o syncope and emesis.  He has a hx of bilateral TKA's (2017).  PMH includes CKD, arthritis, anxiety and bipolar disorder.  Clinical Impression  Patient is a 52 year old male who lives in a one story home with his wife.  He is independent without use of AD at baseline.  Pt is able to perform bed mobility mod I and bring LE's over EOB without assistance.  Pt able to achieve 80 degrees of L knee flexion while sitting at EOB.  He presents with good overall strength, though L LE is limited due to post-op pain and report of numbness of left foot.  Transfer and ambulation deferred due to this. Pt was able to demonstrate proficiency in LE there ex with VC's for controlled motion and some setup.  He is able to perform a SLR with min assistance and quad sets with VC's and some manual cues.  Pt will continue to benefit from skilled PT with focus on strengthening and knee flexion ROM of L LE, safe functional mobility.      Follow Up Recommendations SNF    Equipment Recommendations  None recommended by PT    Recommendations for Other Services       Precautions / Restrictions Precautions Precautions: Fall Restrictions Weight Bearing Restrictions: Yes LLE Weight Bearing: Weight bearing as tolerated      Mobility  Bed Mobility Overal bed mobility: Modified Independent Bed Mobility: Supine to Sit;Sit to Supine     Supine to sit: Modified independent (Device/Increase time) Sit to supine: Modified independent (Device/Increase time)   General bed mobility comments: Able to bring LE's over EOB without assistance.  Requires increased time and use of bedrail.  Transfers Overall transfer level: (Did not perform due to pt report  of lingering numbness in L foot.)                  Ambulation/Gait Ambulation/Gait assistance: (Deferred.)              Stairs            Wheelchair Mobility    Modified Rankin (Stroke Patients Only)       Balance Overall balance assessment: Independent(Sitting balance only tested-good.)                                           Pertinent Vitals/Pain Pain Assessment: 0-10 Pain Location: L knee Pain Descriptors / Indicators: Sharp Pain Intervention(s): Limited activity within patient's tolerance    Home Living Family/patient expects to be discharged to:: Private residence Living Arrangements: Spouse/significant other   Type of Home: House Home Access: Stairs to enter Entrance Stairs-Rails: None Entrance Stairs-Number of Steps: 2 Home Layout: One level Home Equipment: Environmental consultant - 2 wheels;Shower seat      Prior Function Level of Independence: Independent         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 LLE Sensation: decreased light touch;history of peripheral neuropathy;decreased proprioception    Cervical / Trunk Assessment Cervical / Trunk Assessment: Normal  Communication   Communication: No difficulties  Cognition Arousal/Alertness: Awake/alert Behavior During Therapy: WFL for tasks assessed/performed Overall Cognitive Status: Within Functional Limits for tasks assessed                                 General Comments: A&Ox4.  Follows commands consistently.      General Comments      Exercises Total Joint Exercises Ankle  Circles/Pumps: 20 reps;Right;Seated Quad Sets: Strengthening;Left;10 reps;Supine Hip ABduction/ADduction: Strengthening;Left;10 reps;Supine Straight Leg Raises: AAROM;Left;10 reps;Supine Goniometric ROM: L knee ext/flex: 0-80 degrees.   Assessment/Plan    PT Assessment Patient needs continued PT services  PT Problem List Decreased strength;Decreased mobility;Decreased range of motion;Decreased activity tolerance       PT Treatment Interventions DME instruction;Therapeutic activities;Gait training;Therapeutic exercise;Patient/family education;Stair training;Balance training;Functional mobility training;Neuromuscular re-education    PT Goals (Current goals can be found in the Care Plan section)  Acute Rehab PT Goals Patient Stated Goal: To return home when he is able to walk again. PT Goal Formulation: With patient Time For Goal Achievement: 12/23/17 Potential to Achieve Goals: Good    Frequency 7X/week   Barriers to discharge        Co-evaluation               AM-PAC PT "6 Clicks" Daily Activity  Outcome Measure Difficulty turning over in bed (including adjusting bedclothes, sheets and blankets)?: A Little Difficulty moving from lying on back to sitting on the side of the bed? : A Little Difficulty sitting down on and standing up from a chair with arms (e.g., wheelchair, bedside commode, etc,.)?: A Lot Help needed moving to and from a bed to chair (including a wheelchair)?: A Lot Help needed walking in hospital room?: Total Help needed climbing 3-5 steps with a railing? : Total 6 Click Score: 12    End of Session Equipment Utilized During Treatment: Gait belt Activity Tolerance: Treatment limited secondary to medical complications (Comment)(Pt still experiencing numbness in L foot.) Patient left: in bed;with bed alarm set;with call bell/phone within reach Nurse Communication: Mobility status PT Visit Diagnosis: Muscle weakness (generalized) (M62.81);Pain Pain -  Right/Left: Left Pain - part of body: Knee    Time: 1140-1205 PT Time Calculation (min) (ACUTE ONLY): 25 min   Charges:   PT Evaluation $PT Eval Low Complexity: 1 Low PT Treatments $Therapeutic Exercise: 8-22 mins        Roxanne Gates, PT, DPT   Roxanne Gates 12/09/2017, 1:03 PM

## 2017-12-09 NOTE — Evaluation (Signed)
Occupational Therapy Evaluation Patient Details Name: Nathan Chambers MRN: [id number] DOB: 10-Aug-1965 Today's Date: 12/09/2017    History of Present Illness Patient is a 52 year old male admitted following irrigation and debridement for pyogenic arthritis of the L knee following c/o syncope and emesis.  He has a hx of bilateral TKA's (2017).  PMH includes CKD, arthritis, anxiety and bipolar disorder.   Clinical Impression   Pt seen for OT evaluation this date, POD#1 from above surgery. Pt was independent in all ADLs prior to surgery including driving and performing yard work despite having to "hobble around" to do so. Pt is eager to return to PLOF with less pain and improved safety and independence. Pt lives with spouse in one level home with 2-3 steps to enter and has walker at home from previous procedures available to him. Pt currently requires MOD A for LB dressing due to pain and limited AROM of L knee. Pt instructed in polar care mgt, falls prevention strategies, home/routines modifications, DME/AE for LB bathing and dressing tasks, and compression stocking mgt. Pt would benefit from skilled OT services including additional instruction in dressing techniques with or without assistive devices for dressing and bathing skills to support recall and carryover prior to discharge and ultimately to maximize safety, independence, and minimize falls risk and caregiver burden. Recommending HHOT for smooth transition at this time d/t limited ability to evaluate mobility this date, but will continue to assess for needs.      Follow Up Recommendations  Home health OT    Equipment Recommendations  (TBD, potentially BSC and AE for dressing)    Recommendations for Other Services       Precautions / Restrictions Precautions Precautions: Fall Restrictions Weight Bearing Restrictions: Yes LLE Weight Bearing: Weight bearing as tolerated      Mobility Bed Mobility   Transfers Overall transfer  level: (Did not perform due to pt report of lingering numbness in L foot.)                    Balance                                          ADL either performed or assessed with clinical judgement   ADL Overall ADL's : Needs assistance/impaired             Lower Body Bathing: Set up;Minimal assistance Lower Body Bathing Details (indicate cue type and reason): sitting Upper Body Dressing : Set up;Sitting   Lower Body Dressing: Minimal assistance;Moderate assistance;With adaptive equipment(sitting)     Toilet Transfer Details (indicate cue type and reason): not attempted d/t numbness in L LE reported by pt this date (8/10)                 Vision Baseline Vision/History: Wears glasses Wears Glasses: At all times Patient Visual Report: No change from baseline       Perception     Praxis      Pertinent Vitals/Pain Pain Assessment: 0-10 Pain Score: 5  Pain Location: L knee Pain Descriptors / Indicators: Sharp(with movement) Pain Intervention(s): Limited activity within patient's tolerance     Hand Dominance Right   Extremity/Trunk Assessment Upper Extremity Assessment Upper Extremity Assessment: Overall WFL for tasks assessed   Lower Extremity Assessment Lower Extremity Assessment: Defer to PT evaluation LLE Deficits / Details: Pt states he is numb, wiggles  toes slightly and bends knee 1/3 range in bed LLE Sensation: decreased light touch;history of peripheral neuropathy;decreased proprioception   Cervical / Trunk Assessment Cervical / Trunk Assessment: Normal   Communication Communication Communication: No difficulties   Cognition Arousal/Alertness: Awake/alert Behavior During Therapy: WFL for tasks assessed/performed Overall Cognitive Status: Within Functional Limits for tasks assessed                                 General Comments: A&Ox4.  Follows commands consistently.   General Comments        Exercises  Other Exercises Other Exercises: Pt, spouse and child present during education; educated on polar care mgt, compression stocking mgt, use of AE for LB dressing including demonstration. Pt and family verbalize understanding.   Shoulder Instructions      Home Living Family/patient expects to be discharged to:: Private residence Living Arrangements: Spouse/significant other   Type of Home: House Home Access: Stairs to enter [employer] of Steps: 2 Entrance Stairs-Rails: None Home Layout: One level     Bathroom Shower/Tub: Teacher, early years/pre: Standard     Home Equipment: Environmental consultant - 2 wheels;Shower seat;Walker - 4 wheels          Prior Functioning/Environment Level of Independence: Independent        Comments: was able to perform yard work and was driving prior to admit. Performed all ADLs/IADLs indepdendtly, states he has walkers from previous sx, but did not use.         OT Problem List: Decreased strength;Decreased range of motion;Decreased activity tolerance;Impaired balance (sitting and/or standing);Pain      OT Treatment/Interventions: Self-care/ADL training;Therapeutic exercise;DME and/or AE instruction;Patient/family education;Balance training;Therapeutic activities    OT Goals(Current goals can be found in the care plan section) Acute Rehab OT Goals Patient Stated Goal: To return home and get back to what I was doing OT Goal Formulation: With patient Time For Goal Achievement: 12/23/17 Potential to Achieve Goals: Good ADL Goals Pt Will Perform Lower Body Dressing: with set-up;with adaptive equipment(seated EOB/EOC) Pt Will Transfer to Toilet: with mod assist;bedside commode  OT Frequency: Min 1X/week   Barriers to D/C:            Co-evaluation              AM-PAC PT "6 Clicks" Daily Activity     Outcome Measure Help from another person eating meals?: None Help from another person taking care of personal  grooming?: None Help from another person toileting, which includes using toliet, bedpan, or urinal?: A Little Help from another person bathing (including washing, rinsing, drying)?: A Little Help from another person to put on and taking off regular upper body clothing?: None Help from another person to put on and taking off regular lower body clothing?: A Lot 6 Click Score: 20   End of Session    Activity Tolerance: Patient tolerated treatment well;Patient limited by pain(limited by numbness) Patient left: in bed;with call bell/phone within reach;with bed alarm set;with family/visitor present  OT Visit Diagnosis: Unsteadiness on feet (R26.81);Other abnormalities of gait and mobility (R26.89);Muscle weakness (generalized) (M62.81)                Time: 3536-1443 OT Time Calculation (min): 38 min Charges:  OT General Charges $OT Visit: 1 Visit OT Evaluation $OT Eval Moderate Complexity: 1 Mod OT Treatments $Self Care/Home Management : 23-37 mins  Gerrianne Scale, MS, OTR/L ascom [phone]  or [phone] 12/09/17, 3:22 PM

## 2017-12-10 ENCOUNTER — Encounter: Payer: Self-pay | Admitting: Orthopedic Surgery

## 2017-12-10 LAB — BASIC METABOLIC PANEL
Anion gap: 10 (ref 5–15)
BUN: 19 mg/dL (ref 6–20)
CALCIUM: 9.2 mg/dL (ref 8.9–10.3)
CO2: 30 mmol/L (ref 22–32)
CREATININE: 1.23 mg/dL (ref 0.61–1.24)
Chloride: 97 mmol/L — ABNORMAL LOW (ref 98–111)
GFR calc non Af Amer: >60 mL/min (ref >60)
Glucose, Bld: 123 mg/dL — ABNORMAL HIGH (ref 70–99)
Potassium: 3.5 mmol/L (ref 3.5–5.1)
SODIUM: 137 mmol/L (ref 135–145)

## 2017-12-10 LAB — CBC
HCT: 34.7 % — ABNORMAL LOW (ref 40.0–52.0)
Hemoglobin: 12.1 g/dL — ABNORMAL LOW (ref 13.0–18.0)
MCH: 32.6 pg (ref 26.0–34.0)
MCHC: 34.7 g/dL (ref 32.0–36.0)
MCV: 94 fL (ref 80.0–100.0)
PLATELETS: 218 10*3/uL (ref 150–440)
RBC: 3.69 MIL/uL — ABNORMAL LOW (ref 4.40–5.90)
RDW: 13.9 % (ref 11.5–14.5)
WBC: 9.7 10*3/uL (ref 3.8–10.6)

## 2017-12-10 LAB — BODY FLUID CULTURE: Special Requests: NORMAL

## 2017-12-10 LAB — HIV ANTIBODY (ROUTINE TESTING W REFLEX): HIV Screen 4th Generation wRfx: NONREACTIVE

## 2017-12-10 MED ORDER — RIFAMPIN 300 MG PO CAPS
300.0000 mg | ORAL_CAPSULE | Freq: Two times a day (BID) | ORAL | Status: DC
  Administered 2017-12-10 – 2017-12-12 (×5): 300 mg via ORAL
  Filled 2017-12-10 (×6): qty 1

## 2017-12-10 MED ORDER — METRONIDAZOLE IN NACL 5-0.79 MG/ML-% IV SOLN
500.0000 mg | Freq: Three times a day (TID) | INTRAVENOUS | Status: DC
  Administered 2017-12-10 – 2017-12-11 (×4): 500 mg via INTRAVENOUS
  Filled 2017-12-10 (×8): qty 100

## 2017-12-10 NOTE — Progress Notes (Signed)
  Subjective: 2 Days Post-Op Procedure(s) (LRB): KNEE ARTHROTOMY, I&D, WASH OUT, POLY EXCHANGE (Left) IRRIGATION AND DEBRIDEMENT KNEE WITH POLY EXCHANGE (Left) Patient reports pain as 6 on 0-10 scale.   Patient is doing well this AM with no acute complaints.  Some bloody drainage noted to the left bulky dressing. PT and care management to assist with discharge planning. Negative for chest pain and shortness of breath Fever: None recently Gastrointestinal:Negative for nausea and vomiting this morning. Currently on O2, patient has sleep apnea.  Objective: Vital signs in last 24 hours: Temp:  [97.7 F (36.5 C)-98.7 F (37.1 C)] 97.9 F (36.6 C) (08/11 0800) Pulse Rate:  [69-79] 79 (08/11 0800) Resp:  [16-20] 20 (08/11 0800) BP: (93-115)/(53-72) 115/72 (08/11 0800) SpO2:  [94 %-97 %] 95 % (08/11 0800)  Intake/Output from previous day:  Intake/Output Summary (Last 24 hours) at 12/10/2017 0901 Last data filed at 12/10/2017 0816 Gross per 24 hour  Intake 1167.5 ml  Output 2910 ml  Net -1742.5 ml    Intake/Output this shift: Total I/O In: -  Out: 150 [Urine:150]  Labs: Recent Labs    12/08/17 0803 12/09/17 0346 12/10/17 0448  HGB 15.8 12.8* 12.1*   Recent Labs    12/09/17 0346 12/10/17 0448  WBC 16.4* 9.7  RBC 3.95* 3.69*  HCT 37.0* 34.7*  PLT 234 218   Recent Labs    12/09/17 0346 12/10/17 0448  NA 138 137  K 3.5 3.5  CL 100 97*  CO2 27 30  BUN 27* 19  CREATININE 1.83* 1.23  GLUCOSE 133* 123*  CALCIUM 8.4* 9.2   No results for input(s): LABPT, INR in the last 72 hours.   EXAM General - Patient is Alert, Appropriate and Oriented Extremity - ABD soft Sensation intact distally Intact pulses distally Dorsiflexion/Plantar flexion intact Incision: moderate drainage No cellulitis present Dressing/Incision - Old bloody drainage to the left bulky dressing, dressing removed today, honeycomb replaced.  Hemovac removed and 4x4 with tegaderm applied. Motor  Function - intact, moving foot and toes well on exam.  Abdomen is soft with normal bowel sounds.  Past Medical History:  Diagnosis Date  . Anxiety   . Arthritis    knees, hands  . Bipolar disorder ([place])   . Chronic kidney disease    arf 2014 due to dehydration  . Headache   . Hemorrhoids   . Hyperlipidemia   . Hypertension   . LFT elevation   . NAFLD (nonalcoholic fatty liver disease)   . Sleep apnea     Assessment/Plan: 2 Days Post-Op Procedure(s) (LRB): KNEE ARTHROTOMY, I&D, [place] OUT, POLY EXCHANGE (Left) IRRIGATION AND DEBRIDEMENT KNEE WITH POLY EXCHANGE (Left) Active Problems:   Arthritis, septic, knee (HCC)  Estimated body mass index is 41.88 kg/m as calculated from the following:   Height as of this encounter: 6\' 3"  (1.905 m).   Weight as of this encounter: 152 kg. Advance diet Up with therapy D/C IV fluids when tolerating po intake.  Labs reviewed this AM, WBC 9.7, Hg 12.1. BUN and Cr normalized. Bulky dressing removed, hemovac removed. Currently on Ancef, Flagyl and Rifampin. Infectious disease consult placed.  Will see in person tomorrow. Cultures from the knee are pending, Gram stain demonstrate gram positive cocci. CBC and BMP ordered for tomorrow morning.  DVT Prophylaxis - Lovenox, Foot Pumps and TED hose Weight-Bearing as tolerated to left leg  J. Cameron Proud, PA-C [facility] Orthopaedic Surgery 12/10/2017, 9:01 AM

## 2017-12-10 NOTE — Progress Notes (Signed)
Physical Therapy Treatment Patient Details Name: Nathan Chambers MRN: [id number] DOB: [birth year] Today's Date: 12/10/2017    History of Present Illness Patient is a 52 year old male admitted following irrigation and debridement for pyogenic arthritis of the L knee following c/o syncope and emesis.  He has a hx of bilateral TKA's (2017).  PMH includes CKD, arthritis, anxiety and bipolar disorder.    PT Comments    Patient demonstrated progress in tolerance to there ex, ambulation and transfers today.  He reports that sensation has returned to L LE and that he is now aware of his neuropathy again.  Pt able to perform bed mobility mod I and sit at EOB without difficulty.  Pt requested that PT elevate bed so that he could better stand and he was able to perform STS with CGA and proper use of RW.  Pt able to ambulate 20 ft in room with moderate gait deviations, full WB on L LE and flexed posture.  He demonstrated no difficulty in navigating obstacles.  PT introduced seated there ex to pt and he was able to complete with manual cues and VC's for form, controlled motion.  Patient will continue to benefit from skilled PT with focus on strength, tolerance to activity, pain management, safe functional mobility and fall prevention.  Discharge plan has been updated to home with [facility] PT due to improved mobility.   Follow Up Recommendations  Home health PT     Equipment Recommendations  None recommended by PT    Recommendations for Other Services       Precautions / Restrictions Precautions Precautions: Fall Restrictions Weight Bearing Restrictions: Yes LLE Weight Bearing: Weight bearing as tolerated    Mobility  Bed Mobility Overal bed mobility: Modified Independent Bed Mobility: Supine to Sit     Supine to sit: Modified independent (Device/Increase time)     General bed mobility comments: Able to get to OOB without assistance from PT.  Uses bedrail.  Transfers Overall transfer level: Needs  assistance Equipment used: Rolling walker (2 wheeled) Transfers: Sit to/from Stand Sit to Stand: From elevated surface;Min guard         General transfer comment: Requests that bed be elevated significantly.  Able to stand without assistance.  Uses RW appropriately.  Ambulation/Gait Ambulation/Gait assistance: Min guard Gait Distance (Feet): 15 Feet Assistive device: Rolling walker (2 wheeled)     Gait velocity interpretation: <1.8 ft/sec, indicate of risk for recurrent falls General Gait Details: Able to WB fully on L LE, moderate foot clearance, flexed posture, able to navigate obstacles.   Stairs             Wheelchair Mobility    Modified Rankin (Stroke Patients Only)       Balance Overall balance assessment: Modified Independent                                          Cognition Arousal/Alertness: Awake/alert Behavior During Therapy: WFL for tasks assessed/performed Overall Cognitive Status: Within Functional Limits for tasks assessed                                 General Comments: A&Ox4.  Follows commands consistently.      Exercises Total Joint Exercises Quad Sets: Strengthening;Left;15 reps;Seated Towel Squeeze: Strengthening;Both;15 reps;Seated Hip ABduction/ADduction: Strengthening;Left;10 reps;Seated(Use of towel to  reduce friction.) Long Arc Quad: Strengthening;Left;Seated;10 reps(Partial range with some pain increase.) General Exercises - Lower Extremity Hip Flexion/Marching: Strengthening;Both;20 reps;Seated    General Comments        Pertinent Vitals/Pain Pain Assessment: 0-10 Pain Score: 7  Pain Location: R knee: 7/10 with WB and ther ex, 0/10 when NWB Pain Intervention(s): Monitored during session    Home Living                      Prior Function            PT Goals (current goals can now be found in the care plan section) Acute Rehab PT Goals Patient Stated Goal: To return home  when he is able to walk again. PT Goal Formulation: With patient Time For Goal Achievement: 12/23/17 Potential to Achieve Goals: Good Progress towards PT goals: Progressing toward goals    Frequency    7X/week      PT Plan Discharge plan needs to be updated    Co-evaluation              AM-PAC PT "6 Clicks" Daily Activity  Outcome Measure  Difficulty turning over in bed (including adjusting bedclothes, sheets and blankets)?: A Little Difficulty moving from lying on back to sitting on the side of the bed? : A Little Difficulty sitting down on and standing up from a chair with arms (e.g., wheelchair, bedside commode, etc,.)?: A Lot Help needed moving to and from a bed to chair (including a wheelchair)?: A Lot Help needed walking in hospital room?: A Little Help needed climbing 3-5 steps with a railing? : A Little 6 Click Score: 16    End of Session Equipment Utilized During Treatment: Gait belt Activity Tolerance: Patient tolerated treatment well Patient left: in chair;with chair alarm set;with nursing/sitter in room Nurse Communication: Mobility status(Patient experiencing bleeding at location of scab on posterior L LL.  Nursing notified.) PT Visit Diagnosis: Muscle weakness (generalized) (M62.81);Pain Pain - Right/Left: Left Pain - part of body: Knee     Time: 6962-9528 PT Time Calculation (min) (ACUTE ONLY): 28 min  Charges:  $Therapeutic Exercise: 8-22 mins $Therapeutic Activity: 8-22 mins                     Roxanne Gates, PT, DPT    Roxanne Gates 12/10/2017, 9:58 AM

## 2017-12-10 NOTE — Progress Notes (Signed)
Patient resting in bed. IV abx infusing. No complaints at this time. VSS. Call bell in reach. Continue to monitor.

## 2017-12-10 NOTE — Progress Notes (Signed)
Pt is alert and oriented. Pt with small amount of breakthrough bleeding to surgical dressing. Dressing reinforced. Pt using incentive spirometer without difficulty. Voiding without difficulty. Pt has not been able to wean off of oxygen, still remaining on 3L.

## 2017-12-10 NOTE — Progress Notes (Signed)
[facility] [place] NAME: Nathan Chambers    MR#:  [id number]  DATE OF BIRTH:  1965-07-13  SUBJECTIVE:  CHIEF COMPLAINT:   Chief Complaint  Patient presents with  . Loss of Consciousness  . Emesis  no complaints  REVIEW OF SYSTEMS:  CONSTITUTIONAL: No fever, fatigue or weakness.  EYES: No blurred or double vision.  EARS, NOSE, AND THROAT: No tinnitus or ear pain.  RESPIRATORY: No cough, shortness of breath, wheezing or hemoptysis.  CARDIOVASCULAR: No chest pain, orthopnea, edema.  GASTROINTESTINAL: No nausea, vomiting, diarrhea or abdominal pain.  GENITOURINARY: No dysuria, hematuria.  ENDOCRINE: No polyuria, nocturia,  HEMATOLOGY: No anemia, easy bruising or bleeding SKIN: No rash or lesion. MUSCULOSKELETAL: No joint pain or arthritis.   NEUROLOGIC: No tingling, numbness, weakness.  PSYCHIATRY: No anxiety or depression.   ROS  DRUG ALLERGIES:   Allergies  Allergen Reactions  . Etodolac Itching    VITALS:  Blood pressure 104/69, pulse 76, temperature 98.4 F (36.9 C), temperature source Oral, resp. rate 17, height 6\' 3"  (1.905 m), weight (!) 152 kg, SpO2 96 %.  PHYSICAL EXAMINATION:  GENERAL:  52 y.o.-year-old patient lying in the bed with no acute distress.  EYES: Pupils equal, round, reactive to light and accommodation. No scleral icterus. Extraocular muscles intact.  HEENT: Head atraumatic, normocephalic. Oropharynx and nasopharynx clear.  NECK:  Supple, no jugular venous distention. No thyroid enlargement, no tenderness.  LUNGS: Normal breath sounds bilaterally, no wheezing, rales,rhonchi or crepitation. No use of accessory muscles of respiration.  CARDIOVASCULAR: S1, S2 normal. No murmurs, rubs, or gallops.  ABDOMEN: Soft, nontender, nondistended. Bowel sounds present. No organomegaly or mass.  EXTREMITIES: No pedal edema, cyanosis, or clubbing.  NEUROLOGIC: Cranial nerves II through XII are intact. Muscle strength 5/5  in all extremities. Sensation intact. Gait not checked.  PSYCHIATRIC: The patient is alert and oriented x 3.  SKIN: No obvious rash, lesion, or ulcer.   Physical Exam LABORATORY PANEL:   CBC Recent Labs  Lab 12/10/17 0448  WBC 9.7  HGB 12.1*  HCT 34.7*  PLT 218   ------------------------------------------------------------------------------------------------------------------  Chemistries  Recent Labs  Lab 12/10/17 0448  NA 137  K 3.5  CL 97*  CO2 30  GLUCOSE 123*  BUN 19  CREATININE 1.23  CALCIUM 9.2   ------------------------------------------------------------------------------------------------------------------  Cardiac Enzymes Recent Labs  Lab 12/08/17 0803  TROPONINI <0.03   ------------------------------------------------------------------------------------------------------------------  RADIOLOGY:  Dg Chest 2 View  Result Date: 12/08/2017 CLINICAL DATA:  Chest pain, shortness of breath. Preop for knee surgery. EXAM: CHEST - 2 VIEW COMPARISON:  None. FINDINGS: The heart size and mediastinal contours are within normal limits. Both lungs are clear. No pneumothorax or pleural effusion is noted. The visualized skeletal structures are unremarkable. IMPRESSION: No active cardiopulmonary disease. Electronically Signed   By: Marijo Conception, M.D.   On: 12/08/2017 13:57   Dg Knee Left Port  Result Date: 12/08/2017 CLINICAL DATA:  Status post left total knee replacement. Initial encounter. EXAM: PORTABLE LEFT KNEE - 1-2 VIEW COMPARISON:  Left knee radiographs performed earlier today at 8:35 a.m. FINDINGS: There is no evidence of fracture or dislocation. The patient's total knee arthroplasty is unremarkable in appearance, without evidence of loosening. Postoperative packing material and skin staples are noted, with associated drains. No significant joint effusion is seen. The visualized soft tissues are otherwise unremarkable in appearance. IMPRESSION: No evidence of  fracture or dislocation. Total knee arthroplasty is unremarkable in appearance. Electronically  Signed   By: Garald Balding M.D.   On: 12/08/2017 23:15    ASSESSMENT AND PLAN:  *Acute left prostatic joint infection  S/p washout procedure on December 08, 2017 by orthopedic surgery with antibiotic bead placement,  will need for and 1/64-month treatment of IV Ancef/p.o. vancomycin per infectious disease, continue Flagyl per infectious disease, new infectious disease provider will see tomorrow and discussion with ID/Dr. Graylon Good, cultures thus far noted for staph aureus/gram-positive cocci, follow-up on outstanding cultures/sensitivities, for PICC line placement, and continue close medical monitoring  *Chronic benign essential hypertension Stable Continue home regiment  *Chronic bipolar illness Stable Continue home psychotropic regiment  *Chronic kidney disease stage III Stable-at baseline Avoid nephrotoxic agents  *Chronic hyperlipidemia, unspecified Stable Statin therapy discontinued for 2 months to avoid interactions with rifampin per ID  All the records are reviewed and case discussed with Care Management/Social Workerr. Management plans discussed with the patient, family and they are in agreement.  CODE STATUS: full  TOTAL TIME TAKING CARE OF THIS PATIENT: 40 minutes.     POSSIBLE D/C IN 1-3 DAYS, DEPENDING ON CLINICAL CONDITION.   Avel Peace Salary M.D on 12/10/2017   Between 7am to 6pm - Pager - [phone]  After 6pm go to [email or web address] - password [email or web address] [place] [facility]  Office  [phone]  CC: Primary care physician; Juluis Pitch, MD  Note: This dictation was prepared with Dragon dictation along with smaller phrase technology. Any transcriptional errors that result from this process are unintentional.

## 2017-12-10 NOTE — Plan of Care (Signed)
  Problem: Education: Goal: Knowledge of General Education information will improve Description Including pain rating scale, medication(s)/side effects and non-pharmacologic comfort measures Outcome: Progressing   Problem: Health Behavior/Discharge Planning: Goal: Ability to manage health-related needs will improve Outcome: Progressing   Problem: Clinical Measurements: Goal: Ability to maintain clinical measurements within normal limits will improve Outcome: Progressing Goal: Will remain free from infection Outcome: Progressing Goal: Diagnostic test results will improve Outcome: Progressing Goal: Respiratory complications will improve Outcome: Progressing Goal: Cardiovascular complication will be avoided Outcome: Progressing   Problem: Activity: Goal: Risk for activity intolerance will decrease Outcome: Progressing   Problem: Nutrition: Goal: Adequate nutrition will be maintained Outcome: Progressing   Problem: Coping: Goal: Level of anxiety will decrease Outcome: Progressing   Problem: Elimination: Goal: Will not experience complications related to bowel motility Outcome: Progressing Goal: Will not experience complications related to urinary retention Outcome: Progressing   Problem: Pain Managment: Goal: General experience of comfort will improve Outcome: Progressing   Problem: Safety: Goal: Ability to remain free from injury will improve Outcome: Progressing   Problem: Skin Integrity: Goal: Risk for impaired skin integrity will decrease Outcome: Progressing   Problem: Education: Goal: Knowledge of the prescribed therapeutic regimen will improve Outcome: Progressing   Problem: Activity: Goal: Ability to avoid complications of mobility impairment will improve Outcome: Progressing   Problem: Clinical Measurements: Goal: Postoperative complications will be avoided or minimized Outcome: Progressing   Problem: Pain Management: Goal: Pain level will  decrease with appropriate interventions Outcome: Progressing   Problem: Skin Integrity: Goal: Will show signs of wound healing Outcome: Progressing

## 2017-12-11 ENCOUNTER — Inpatient Hospital Stay: Payer: Self-pay

## 2017-12-11 ENCOUNTER — Encounter: Payer: Self-pay | Admitting: Orthopedic Surgery

## 2017-12-11 DIAGNOSIS — B9561 Methicillin susceptible Staphylococcus aureus infection as the cause of diseases classified elsewhere: Secondary | ICD-10-CM

## 2017-12-11 DIAGNOSIS — T8454XA Infection and inflammatory reaction due to internal left knee prosthesis, initial encounter: Secondary | ICD-10-CM

## 2017-12-11 DIAGNOSIS — Z79891 Long term (current) use of opiate analgesic: Secondary | ICD-10-CM

## 2017-12-11 DIAGNOSIS — Z96653 Presence of artificial knee joint, bilateral: Secondary | ICD-10-CM

## 2017-12-11 DIAGNOSIS — Z79899 Other long term (current) drug therapy: Secondary | ICD-10-CM

## 2017-12-11 DIAGNOSIS — I10 Essential (primary) hypertension: Secondary | ICD-10-CM

## 2017-12-11 DIAGNOSIS — E785 Hyperlipidemia, unspecified: Secondary | ICD-10-CM

## 2017-12-11 DIAGNOSIS — K760 Fatty (change of) liver, not elsewhere classified: Secondary | ICD-10-CM

## 2017-12-11 DIAGNOSIS — Z7982 Long term (current) use of aspirin: Secondary | ICD-10-CM

## 2017-12-11 DIAGNOSIS — G629 Polyneuropathy, unspecified: Secondary | ICD-10-CM

## 2017-12-11 DIAGNOSIS — Z888 Allergy status to other drugs, medicaments and biological substances status: Secondary | ICD-10-CM

## 2017-12-11 DIAGNOSIS — E669 Obesity, unspecified: Secondary | ICD-10-CM

## 2017-12-11 DIAGNOSIS — Z87891 Personal history of nicotine dependence: Secondary | ICD-10-CM

## 2017-12-11 DIAGNOSIS — G5600 Carpal tunnel syndrome, unspecified upper limb: Secondary | ICD-10-CM

## 2017-12-11 DIAGNOSIS — Z791 Long term (current) use of non-steroidal anti-inflammatories (NSAID): Secondary | ICD-10-CM

## 2017-12-11 LAB — CBC
HCT: 33.1 % — ABNORMAL LOW (ref 40.0–52.0)
HEMOGLOBIN: 11.5 g/dL — AB (ref 13.0–18.0)
MCH: 32.6 pg (ref 26.0–34.0)
MCHC: 34.8 g/dL (ref 32.0–36.0)
MCV: 93.4 fL (ref 80.0–100.0)
PLATELETS: 279 10*3/uL (ref 150–440)
RBC: 3.54 MIL/uL — ABNORMAL LOW (ref 4.40–5.90)
RDW: 13.8 % (ref 11.5–14.5)
WBC: 8.5 10*3/uL (ref 3.8–10.6)

## 2017-12-11 LAB — BASIC METABOLIC PANEL
Anion gap: 10 (ref 5–15)
BUN: 12 mg/dL (ref 6–20)
CHLORIDE: 96 mmol/L — AB (ref 98–111)
CO2: 31 mmol/L (ref 22–32)
Calcium: 9.2 mg/dL (ref 8.9–10.3)
Creatinine, Ser: 0.97 mg/dL (ref 0.61–1.24)
Glucose, Bld: 124 mg/dL — ABNORMAL HIGH (ref 70–99)
Potassium: 3.2 mmol/L — ABNORMAL LOW (ref 3.5–5.1)
SODIUM: 137 mmol/L (ref 135–145)

## 2017-12-11 MED ORDER — SODIUM CHLORIDE 0.9% FLUSH
10.0000 mL | Freq: Two times a day (BID) | INTRAVENOUS | Status: DC
  Administered 2017-12-11 (×2): 10 mL

## 2017-12-11 MED ORDER — CEFAZOLIN IV (FOR PTA / DISCHARGE USE ONLY): 2.0000 g | Freq: Three times a day (TID) | INTRAVENOUS | 0 refills | Status: AC

## 2017-12-11 MED ORDER — SODIUM CHLORIDE 0.9% FLUSH: 10.0000 mL | INTRAVENOUS | Status: DC | PRN

## 2017-12-11 MED ORDER — POTASSIUM CHLORIDE 20 MEQ PO PACK
20.0000 meq | PACK | Freq: Every day | ORAL | Status: DC
  Administered 2017-12-11 – 2017-12-12 (×2): 20 meq via ORAL
  Filled 2017-12-11 (×2): qty 1

## 2017-12-11 MED ORDER — RISAQUAD PO CAPS
1.0000 | ORAL_CAPSULE | Freq: Every day | ORAL | Status: DC
  Administered 2017-12-11 – 2017-12-12 (×2): 1 via ORAL
  Filled 2017-12-11 (×2): qty 1

## 2017-12-11 NOTE — Progress Notes (Signed)
Pt not able to wean off of oxygen at this time.

## 2017-12-11 NOTE — Care Management Note (Signed)
Case Management Note  Patient Details  Name: Nathan Chambers MRN: [id number] Date of Birth: 12/02/1965  Subjective/Objective:  Met with patient and his mother in law to discuss discharge planning. It is anticipated that patient will discharge home tomorrow. He will need a RN, PT, OT and home IV antibiotics. He has a new PICC line. Patient has no agency preference for any of his services. I arranged IV antibiotics with [facility] that should start tomorrow around 1 pm in the home. Dr. Michail Sermon is to put home IV antibiotics orders in. Wife will be in the home to assist patient with IV. [facility] to provide RN, PT and OT.  Corene Cornea with [facility] in to speak with patient regarding IV antibiotic copays.  He will discharge home on [name] [phone]. Called Lovenox 40 mg # 14 no refills.Patient has a walker, no bedside commode need.     Action/Plan: [facility] for HHPT, OT and RN. Home IV anx with [facility]. Lovenox called in. No DME needs  Expected Discharge Date:  12/10/17               Expected Discharge Plan:  [place]  In-House Referral:     Discharge planning Services  CM Consult  Post Acute Care Choice:  Home Health Choice offered to:  Patient  DME Arranged:    DME Agency:     HH Arranged:  RN, PT, OT, IV Antibiotics HH Agency:  [place]  Status of Service:  In process, will continue to follow  If discussed at Long Length of Stay Meetings, dates discussed:    Additional Comments:  Jolly Mango, RN 12/11/2017, 4:17 PM

## 2017-12-11 NOTE — Progress Notes (Signed)
Pt alert and oriented. Medicated for pain with good results. Restless at times. Tolerating iv antibiotics without difficulty. Pt voiding without difficulty. Pt was able to walk into bathroom with assistance and was able to move bowels. Surgical dressing dry and intact. Sitting up in the chair this morning.

## 2017-12-11 NOTE — Progress Notes (Signed)
Subjective: 3 Days Post-Op Procedure(s) (LRB): KNEE ARTHROTOMY, I&D, [place] OUT, POLY EXCHANGE (Left) IRRIGATION AND DEBRIDEMENT KNEE WITH POLY EXCHANGE (Left) Patient reports pain as mild.   Patient is doing well this AM with no acute complaints. Unable to wean on to room air at this time, currently on 3L of O2. PT currently recommending HHPT upon discharge. Negative for chest pain and shortness of breath Fever: None recently Gastrointestinal:Negative for nausea and vomiting this morning. Will be seeing ID today.  Objective: Vital signs in last 24 hours: Temp:  [97.9 F (36.6 C)-98.5 F (36.9 C)] 98 F (36.7 C) (08/11 2318) Pulse Rate:  [76-86] 82 (08/11 2318) Resp:  [17-22] 22 (08/11 2318) BP: (104-115)/(69-72) 114/71 (08/11 2318) SpO2:  [88 %-97 %] 94 % (08/12 0526)  Intake/Output from previous day:  Intake/Output Summary (Last 24 hours) at 12/11/2017 0746 Last data filed at 12/11/2017 0427 Gross per 24 hour  Intake 890 ml  Output 1500 ml  Net -610 ml    Intake/Output this shift: No intake/output data recorded.  Labs: Recent Labs    12/08/17 0803 12/09/17 0346 12/10/17 0448 12/11/17 0405  HGB 15.8 12.8* 12.1* 11.5*   Recent Labs    12/10/17 0448 12/11/17 0405  WBC 9.7 8.5  RBC 3.69* 3.54*  HCT 34.7* 33.1*  PLT 218 279   Recent Labs    12/10/17 0448 12/11/17 0405  NA 137 137  K 3.5 3.2*  CL 97* 96*  CO2 30 31  BUN 19 12  CREATININE 1.23 0.97  GLUCOSE 123* 124*  CALCIUM 9.2 9.2   No results for input(s): LABPT, INR in the last 72 hours.   EXAM General - Patient is Alert, Appropriate and Oriented Extremity - ABD soft Sensation intact distally Intact pulses distally Dorsiflexion/Plantar flexion intact Incision: dressing C/D/I No cellulitis present Dressing/Incision - Honeycomb dressing without any acute drainage to the left knee, no significant erythema present. Motor Function - intact, moving foot and toes well on exam.  Abdomen is soft  with normal bowel sounds.  Past Medical History:  Diagnosis Date  . Anxiety   . Arthritis    knees, hands  . Bipolar disorder ([place])   . Chronic kidney disease    arf 2014 due to dehydration  . Headache   . Hemorrhoids   . Hyperlipidemia   . Hypertension   . LFT elevation   . NAFLD (nonalcoholic fatty liver disease)   . Sleep apnea     Assessment/Plan: 3 Days Post-Op Procedure(s) (LRB): KNEE ARTHROTOMY, I&D, [place] OUT, POLY EXCHANGE (Left) IRRIGATION AND DEBRIDEMENT KNEE WITH POLY EXCHANGE (Left) Active Problems:   Arthritis, septic, knee (HCC)  Estimated body mass index is 41.88 kg/m as calculated from the following:   Height as of this encounter: 6\' 3"  (1.905 m).   Weight as of this encounter: 152 kg. Advance diet Up with therapy D/C IV fluids when tolerating po intake.  Labs reviewed this AM, WBC 8.5, Hg 11.5. Honeycomb in placed. Currently on Ancef, Flagyl and Rifampin. Will be seeing ID today. Cultures from surgery demonstrate moderate staph aureus growth, gram positive cocci on gram stain. Sed Rate on 12/08/17 was 42 and CRP 22.9.  Will want to recheck these labs on 12/15/17.  Upon discharge from hospital continue Lovenox 40mg  daily for 14 days. Follow-up with [facility] in 14 days for staple removal.   DVT Prophylaxis - Lovenox, Foot Pumps and TED hose Weight-Bearing as tolerated to left leg  J. Cameron Proud, PA-C [facility]  Orthopaedic Surgery 12/11/2017, 7:46 AM

## 2017-12-11 NOTE — Progress Notes (Signed)
[facility] at [place] NAME: Nathan Chambers    MR#:  [id number]  DATE OF BIRTH:  [birth year]  SUBJECTIVE:  CHIEF COMPLAINT:   Chief Complaint  Patient presents with  . Loss of Consciousness  . Emesis   - left knee arthrotomy and washout secondary to MSSA infection.  PICC line placement today for long-term IV antibiotics.  REVIEW OF SYSTEMS:  Review of Systems  Constitutional: Negative for chills, fever and malaise/fatigue.  HENT: Negative for congestion, ear discharge, hearing loss and nosebleeds.   Eyes: Negative for blurred vision and double vision.  Respiratory: Negative for cough, shortness of breath and wheezing.   Cardiovascular: Negative for chest pain, palpitations and leg swelling.  Gastrointestinal: Negative for abdominal pain, constipation, diarrhea, nausea and vomiting.  Genitourinary: Negative for dysuria.  Musculoskeletal: Positive for joint pain.  Neurological: Negative for dizziness, focal weakness, seizures, weakness and headaches.  Psychiatric/Behavioral: Negative for depression.    DRUG ALLERGIES:   Allergies  Allergen Reactions  . Etodolac Itching    VITALS:  Blood pressure 140/83, pulse 83, temperature 97.8 F (36.6 C), temperature source Oral, resp. rate (!) 22, height 6\' 3"  (1.905 m), weight (!) 152 kg, SpO2 99 %.  PHYSICAL EXAMINATION:  Physical Exam  GENERAL:  52 y.o.-year-old obese patient lying in the bed with no acute distress.  EYES: Pupils equal, round, reactive to light and accommodation. No scleral icterus. Extraocular muscles intact.  HEENT: Head atraumatic, normocephalic. Oropharynx and nasopharynx clear.  NECK:  Supple, no jugular venous distention. No thyroid enlargement, no tenderness.  LUNGS: Normal breath sounds bilaterally, no wheezing, rales,rhonchi or crepitation. No use of accessory muscles of respiration.  CARDIOVASCULAR: S1, S2 normal. No murmurs, rubs, or gallops.  ABDOMEN: Soft,  nontender, nondistended. Bowel sounds present. No organomegaly or mass.  EXTREMITIES: left knee with ice dressing/brace. -No pedal edema, cyanosis, or clubbing.  NEUROLOGIC: Cranial nerves II through XII are intact. Muscle strength 5/5 in all extremities. Sensation intact. Gait not checked.  PSYCHIATRIC: The patient is alert and oriented x 3.  SKIN: No obvious rash, lesion, or ulcer.    LABORATORY PANEL:   CBC Recent Labs  Lab 12/11/17 0405  WBC 8.5  HGB 11.5*  HCT 33.1*  PLT 279   ------------------------------------------------------------------------------------------------------------------  Chemistries  Recent Labs  Lab 12/11/17 0405  NA 137  K 3.2*  CL 96*  CO2 31  GLUCOSE 124*  BUN 12  CREATININE 0.97  CALCIUM 9.2   ------------------------------------------------------------------------------------------------------------------  Cardiac Enzymes Recent Labs  Lab 12/08/17 0803  TROPONINI <0.03   ------------------------------------------------------------------------------------------------------------------  RADIOLOGY:  [place] Ekg Site Rite  Result Date: 12/11/2017 If Site Rite image not attached, placement could not be confirmed due to current cardiac rhythm.   EKG:   Orders placed or performed during the hospital encounter of 12/08/17  . EKG 12-Lead  . EKG 12-Lead    ASSESSMENT AND PLAN:   52 year old male with past medical history significant for CKD, arthritis, bipolar and anxiety and history of bilateral total knee replacements presents to hospital secondary to septic arthritis of the left knee.  1.  Left knee septic arthritis-appreciate Ortho consult.  Status post arthrotomy and I&D. -Cultures growing MSSA -Appreciate ID consult. -Patient on Ancef, rifampin and Flagyl for 6 weeks for now and then long term oral ABX for prosthetic joint infection  2. HTN-Norvasc, benazepril, chlorthalidone  3.  Peripheral neuropathy-on gabapentin  4.   Depression and anxiety-continue home meds  5.  DVT  prophylaxis-Lovenox  6.  Foot infection-continue Flagyl for total of 10 days.  Ambulatory at baseline    All the records are reviewed and case discussed with Care Management/Social Workerr. Management plans discussed with the patient, family and they are in agreement.  CODE STATUS: Full Code  TOTAL TIME TAKING CARE OF THIS PATIENT: 38 minutes.   POSSIBLE D/C IN 1-2 DAYS, DEPENDING ON CLINICAL CONDITION.   Gladstone Lighter M.D on 12/11/2017 at 2:02 PM  Between 7am to 6pm - Pager - [phone]  After 6pm go to [email or web address] - password [email or web address] [place] [facility]  Office  [phone]  CC: Primary care physician; Juluis Pitch, MD

## 2017-12-11 NOTE — Progress Notes (Signed)
Physical Therapy Treatment Patient Details Name: Nathan Chambers MRN: [id number] DOB: [birth year] Today's Date: 12/11/2017    History of Present Illness Patient is a 52 year old male admitted following irrigation and debridement for pyogenic arthritis of the L knee following c/o syncope and emesis.  He has a hx of bilateral TKA's (2017).  PMH includes CKD, arthritis, anxiety and bipolar disorder.    PT Comments    Significant increase in gait distance and overall activity tolerance, completing two full laps around nursing statio with RW, no greater than close sup from therapist.  Decreased cadence/gait speed, but no buckling or LOB; good L knee control and overall WBing.     Follow Up Recommendations  Home health PT     Equipment Recommendations  None recommended by PT(has all necessary equipment)    Recommendations for Other Services       Precautions / Restrictions Precautions Precautions: Fall Restrictions Weight Bearing Restrictions: Yes LLE Weight Bearing: Weight bearing as tolerated    Mobility  Bed Mobility               General bed mobility comments: seated in recliner beginning/end of treatment session  Transfers Overall transfer level: Needs assistance Equipment used: Rolling walker (2 wheeled) Transfers: Sit to/from Stand Sit to Stand: Modified independent (Device/Increase time)         General transfer comment: maintains L LE anterior to BOS; encouraged for increased L knee flexion, progression towards symmetrical foot placement/WBing as pain allows  Ambulation/Gait Ambulation/Gait assistance: Supervision Gait Distance (Feet): 380 Feet Assistive device: Rolling walker (2 wheeled)   Gait velocity: 10' walk time, 10 seconds   General Gait Details: reciprocal stepping pattern with good step height/length, good L LE stance time and weight acceptance.  Good L knee control   Stairs             Wheelchair Mobility    Modified Rankin  (Stroke Patients Only)       Balance Overall balance assessment: Modified Independent                                          Cognition Arousal/Alertness: Awake/alert Behavior During Therapy: WFL for tasks assessed/performed Overall Cognitive Status: Within Functional Limits for tasks assessed                                        Exercises Total Joint Exercises Goniometric ROM: L knee: 0-85 degrees, limited by pain Other Exercises Other Exercises: Standing LE therex, 1x10, AROM with RW, close sup: heel raises, mini squats, marching.  Good functional ROM and WBing L LE throughout all exercises    General Comments        Pertinent Vitals/Pain Pain Assessment: 0-10 Pain Score: 5  Pain Location: L knee Pain Descriptors / Indicators: Aching Pain Intervention(s): Limited activity within patient's tolerance;Monitored during session;Repositioned;RN gave pain meds during session    Home Living                      Prior Function            PT Goals (current goals can now be found in the care plan section) Acute Rehab PT Goals Patient Stated Goal: To return home when he is able to walk again. PT  Goal Formulation: With patient Time For Goal Achievement: 12/23/17 Potential to Achieve Goals: Good Progress towards PT goals: Progressing toward goals    Frequency    7X/week      PT Plan Current plan remains appropriate    Co-evaluation              AM-PAC PT "6 Clicks" Daily Activity  Outcome Measure  Difficulty turning over in bed (including adjusting bedclothes, sheets and blankets)?: None Difficulty moving from lying on back to sitting on the side of the bed? : None Difficulty sitting down on and standing up from a chair with arms (e.g., wheelchair, bedside commode, etc,.)?: None Help needed moving to and from a bed to chair (including a wheelchair)?: None Help needed walking in hospital room?: A Little Help  needed climbing 3-5 steps with a railing? : A Little 6 Click Score: 22    End of Session Equipment Utilized During Treatment: Gait belt Activity Tolerance: Patient tolerated treatment well Patient left: in chair;with call bell/phone within reach;with chair alarm set;with family/visitor present Nurse Communication: Mobility status PT Visit Diagnosis: Muscle weakness (generalized) (M62.81);Pain;Difficulty in walking, not elsewhere classified (R26.2) Pain - Right/Left: Left Pain - part of body: Knee     Time: 1962-2297 PT Time Calculation (min) (ACUTE ONLY): 27 min  Charges:  $Gait Training: 8-22 mins $Therapeutic Exercise: 8-22 mins                     Christa Fasig H. Owens Shark, PT, DPT, NCS 12/11/17, 10:11 PM [phone]

## 2017-12-11 NOTE — Progress Notes (Signed)
PHARMACY CONSULT NOTE FOR:  OUTPATIENT  PARENTERAL ANTIBIOTIC THERAPY (OPAT)  Indication: MSSA prosthetic joint infection Regimen: cefazolin 2 gm IV Q8H End date: 01/22/2018 (to complete 6 weeks of IV Ancef)  IV antibiotic discharge orders are pended. To discharging provider:  please sign these orders via discharge navigator,  Select New Orders & click on the button choice - Manage This Unsigned Work.     Thank you for allowing pharmacy to be a part of this patient's care.  Laural Benes, Pharm.D., BCPS Clinical Pharmacist 12/11/2017, 4:21 PM

## 2017-12-11 NOTE — Consult Note (Signed)
Reason for Consult: Left prosthetic knee joint infection with staph aureus.    Referring Physician: Dr. Ria Comment is an 52 y.o. male.  With a history of bilateral total knee arthroplasty hypertension, hyperlipidemia and nonalcoholic fatty liver disease, obesity is admitted with left knee swelling and pain for the past 5 days.   HPI: Patient states he sustained an injury to his left leg when he was mowing the lawn when the weed mover struck his skin and caused tearing of the skin.  His wife cleaned the wound with hydrogen peroxide and put neomycin.  A few days afterwards he noted that his left leg was painful and and his knee was swollen.  He also tripped and fell on his right side.  As his mouth was feeling very dry and he was sweating a lot he came to the emergency department on 12/08/2017.  He also had an episode of diarrhea and was having dry heaves.  Patient did not record his temperature.  He particularly did not feel hot.  But was sweating a lot.  In the past he has had 2 episodes of dizziness followed by syncope and is being followed by his PCP.  He also sees the neurologist for carpal tunnel syndrome and neuropathy. He states whenever he is dehydrated his kidney functions are not good but they returned to normal after that.  In the ED temperature was 98.7, heart rate of 100, blood pressure of 116 x 65.  WBC was 20.1, hemoglobin 15.8, creatinine 1.95. The left knee was aspirated andSynovial fluid showed 53,293 WBC with 48% neutrophils and 15% lymphocytes and 37 percent mono.  Patient had left knee arthrotomy with irrigation and debridement and polyethylene exchange and placement of antibiotic impregnated bio composite beats on 12/08/2017 by Dr. Marry Guan.  Started on vancomycin, ceftriaxone and Flagyl.  The synovial culture came back as staph aureus.  Over the weekend the on-call ID switched her antibiotics to cefazolin and rifampin. Patient is feeling better.  Past Medical History:    Diagnosis Date  . Anxiety   . Arthritis    knees, hands  . Bipolar disorder ([place])   . Chronic kidney disease    arf 2014 due to dehydration  . Headache   . Hemorrhoids   . Hyperlipidemia   . Hypertension   . LFT elevation   . NAFLD (nonalcoholic fatty liver disease)   . Sleep apnea     Past Surgical History:  Procedure Laterality Date  . ACHILLES TENDON SURGERY Right 10/30/2015   Procedure: ACHILLES TENDON REPAIR/ HEGLAND OSTECTOMY;  Surgeon: [name], DPM;  Location: [facility] ORS;  Service: Podiatry;  Laterality: Right;  . COLONOSCOPY    . COLONOSCOPY WITH PROPOFOL N/A 04/07/2017   Procedure: COLONOSCOPY WITH PROPOFOL;  Surgeon: Toledo, Benay Pike, MD;  Location: [facility];  Service: Gastroenterology;  Laterality: N/A;  . FRACTURE SURGERY Left    MIDDLE FINGER  . HERNIA REPAIR    . I&D KNEE WITH POLY EXCHANGE Left 12/08/2017   Procedure: IRRIGATION AND DEBRIDEMENT KNEE WITH POLY EXCHANGE;  Surgeon: Dereck Leep, MD;  Location: [facility] ORS;  Service: Orthopedics;  Laterality: Left;  . JOINT REPLACEMENT    . KNEE ARTHROTOMY Left 12/08/2017   Procedure: KNEE ARTHROTOMY, I&D, [place] OUT, POLY EXCHANGE;  Surgeon: Dereck Leep, MD;  Location: [facility] ORS;  Service: Orthopedics;  Laterality: Left;  . LIPOMA EXCISION    . TOTAL KNEE ARTHROPLASTY Bilateral 04/19/2016   Procedure: TOTAL KNEE BILATERAL;  Surgeon:  Hessie Knows, MD;  Location: [facility] ORS;  Service: Orthopedics;  Laterality: Bilateral;  . VASECTOMY       Social History: Lives with his wife Has a pitbull Ex-smoker No alcohol or illicit drug use  Not Working   Family history Hypertension mother  osteoporosis mother  diabetes Father Allergies:  Allergies  Allergen Reactions  . Etodolac Itching   Home medicines Xanax Lotrel Aspirin Chlorthalidone Folic acid Gabapentin Gemfibrozil Mobic as needed Robaxin Oxycodone Paxil Pravachol Ropinirole  ROS Constitutional: No Fever, no weight loss Eyes: No  blurring of vision or discharge HEENT: No nasal congestion, sore throat, has dry mouth Respiratory system: No cough or shortness of breath Cardiovascular system: No chest pain, palpitations, pedal edema, cold extremities Gastrointestinal system: No pain abdomen, constipation, nausea, one episode of diarrhea  Genitourinary system: No dysuria or hematuria.  Has urge incontinence and has to go to the bathroom frequently. Central nervous system:  dizziness, copy falls   endocrine: Increased sweating but says no diabetes Skin: Leg injury Musculoskeletal system: Left knee pain Allergies as indicated above.   Physical examination Vitals signs : BP 118/61 (BP Location: Left Arm)   Pulse 96   Temp 99.1 F (37.3 C) (Oral)   Resp (!) 21   Ht '6\' 3"'$  (1.905 m)   Wt (!) 152 kg   SpO2 98%   BMI 41.88 kg/m    General: Awake, alert, oriented x5, no distress, obese HEENT: Tongue coated, no thrush, pupils equal reacting to light, scalp normal  Cardiovascular: S1-S2, no murmur respiratory: Lungs clear to auscultate, no crepitations or rhonchi Gastrointestinal: Abdomen soft, obese, , no tenderness, bowel sounds normal Neurological: Moves all limbs, nonfocal examination. Skin: Left lower leg wound present on the posterior aspect.  1 cm Musculoskeletal system: Left knee joint status post I&D covered with dressing. Right knee joint scar healthy Immunological: No lymph nodes palpable,  Labs WBC 20.1 on 12/08/2017 WBC 8.5 on 12/11/2017 Hb 15.8 on 12/08/2017  Hb 11.5 on 12/11/2017 Platelets 279    Microbiology Final we will fluid culture Staphylococcus aureus Blood culture no growth  Reviewed x-ray of the left knee personally  Hardware present-no loosening of hardware  Assessment/Plan: 52 y.o. male.  With a history of bilateral total knee arthroplasty hypertension, hyperlipidemia and nonalcoholic fatty liver disease, obesity is admitted with left knee swelling and pain for the past 5 days.      Left prosthetic joint infection of the knee due to methicillin sensitive staph aureus.  Likely source is the wound on the left leg sustained due to weed mover. He has had I&D with debridement and irrigation and  polyethylene exchange.  Hardware is still present in the knee.  He will need 6 weeks of IV cefazolin 2grams every 8 hrs and p.o. rifampin '300mg'$  PO BID following which he will need antistaphylococcal oral antibiotic like either dicloxacillin or Keflex for 3 to 4 months.  We will also need to discuss with orthopedics regarding whether he will have any further surgery in the future. Because of rifampin he may have interactions with quite a few medications and pravastatin is 1 of them.  It has been stopped.  There are some other medications with effect may be less because of rifampin. Will need a probiotic. He will need weekly labs to check his creatinine, liver function tests CBC and ESR He has had abnormal creatinine in the past but in between has had normal renal function.  Primary team to check with renal to see whether  he can have a PICC line placed.  Left leg wound on the posterior aspect.  Will do a culture to see whether staph aureus is present in the wound.  No  need for anaerobic coverage. so will stop metronidazole.   Acute kidney injury secondary to dehydration and infection.  Has resolved.     Hypertension on Lotrel and chlorthalidone.  Hyperlipidemia on Pravachol and gemfibrozil.  Pravachol has been discontinued because of rifampin.  Nonalcoholic fatty liver disease.  Watch LFTs closely while on rifampin.  Discussed the management in detail with the patient and his mother-in-law who was at his bedside. Discussed with Dr. Constance Goltz 12/11/2017, 5:15 PM

## 2017-12-11 NOTE — Progress Notes (Signed)
Peripherally Inserted Central Catheter/Midline Placement  The IV Nurse has discussed with the patient and/or persons authorized to consent for the patient, the purpose of this procedure and the potential benefits and risks involved with this procedure.  The benefits include less needle sticks, lab draws from the catheter, and the patient may be discharged home with the catheter. Risks include, but not limited to, infection, bleeding, blood clot (thrombus formation), and puncture of an artery; nerve damage and irregular heartbeat and possibility to perform a PICC exchange if needed/ordered by physician.  Alternatives to this procedure were also discussed.  Bard Power PICC patient education guide, fact sheet on infection prevention and patient information card has been provided to patient /or left at bedside.    PICC/Midline Placement Documentation  PICC Single Lumen 82/80/03 PICC Right Basilic 45 cm 0 cm (Active)  Indication for Insertion or Continuance of Line Home intravenous therapies (PICC only) 12/11/2017  2:04 PM  Exposed Catheter (cm) 0 cm 12/11/2017  2:04 PM  Site Assessment Clean;Dry;Intact 12/11/2017  2:04 PM  Line Status Flushed;Blood return noted 12/11/2017  2:04 PM  Dressing Type Transparent 12/11/2017  2:04 PM  Dressing Status Clean;Dry;Intact;Antimicrobial disc in place 12/11/2017  2:04 PM  Dressing Intervention New dressing 12/11/2017  2:04 PM  Dressing Change Due 12/18/17 12/11/2017  2:04 PM       Christella Noa Albarece 12/11/2017, 2:05 PM

## 2017-12-12 DIAGNOSIS — M02069 Arthropathy following intestinal bypass, unspecified knee: Secondary | ICD-10-CM | POA: Diagnosis not present

## 2017-12-12 DIAGNOSIS — A4901 Methicillin susceptible Staphylococcus aureus infection, unspecified site: Secondary | ICD-10-CM | POA: Diagnosis not present

## 2017-12-12 DIAGNOSIS — T8454XA Infection and inflammatory reaction due to internal left knee prosthesis, initial encounter: Secondary | ICD-10-CM | POA: Diagnosis not present

## 2017-12-12 DIAGNOSIS — G8911 Acute pain due to trauma: Secondary | ICD-10-CM | POA: Diagnosis not present

## 2017-12-12 MED ORDER — OXYCODONE HCL 5 MG PO TABS: 5.0000 mg | ORAL_TABLET | Freq: Four times a day (QID) | ORAL | 0 refills | Status: DC | PRN

## 2017-12-12 MED ORDER — GABAPENTIN 600 MG PO TABS: 600.0000 mg | ORAL_TABLET | Freq: Three times a day (TID) | ORAL | 0 refills | Status: DC

## 2017-12-12 MED ORDER — ENOXAPARIN SODIUM 30 MG/0.3ML ~~LOC~~ SOLN: 30.0000 mg | Freq: Two times a day (BID) | SUBCUTANEOUS | 0 refills | Status: DC

## 2017-12-12 MED ORDER — SENNOSIDES-DOCUSATE SODIUM 8.6-50 MG PO TABS: 1.0000 | ORAL_TABLET | Freq: Two times a day (BID) | ORAL | 0 refills | Status: DC

## 2017-12-12 MED ORDER — RIFAMPIN 300 MG PO CAPS: 300.0000 mg | ORAL_CAPSULE | Freq: Two times a day (BID) | ORAL | 0 refills | Status: AC

## 2017-12-12 MED ORDER — RISAQUAD PO CAPS: 1.0000 | ORAL_CAPSULE | Freq: Every day | ORAL | 1 refills | Status: DC

## 2017-12-12 MED ORDER — FERROUS SULFATE 325 (65 FE) MG PO TABS: 325.0000 mg | ORAL_TABLET | Freq: Two times a day (BID) | ORAL | 0 refills | Status: DC

## 2017-12-12 NOTE — Progress Notes (Signed)
Discharge instructions and medication details reviewed with patient. Printed prescriptions given to patient. All questions answered. AVS given to patient. Patient was escorted out via wheelchair.

## 2017-12-12 NOTE — Progress Notes (Signed)
Pt called nurse to the room because he thought his knee had burst open. Nurse found dressing saturated with blood. Dressing was changed. Blood was oozing from incision near the bottom of the incsion. Staples are intact. No redness noted. Pt has ambulated to bathroom several times during the night. Pt was encouraged to remain in bed.

## 2017-12-12 NOTE — Progress Notes (Signed)
Honeycomb dressing saturated and leaking through.  MD Rudene Christians made aware. Per verbal orders, reinforce dressing with ABD pads and ACE wrap.

## 2017-12-12 NOTE — Care Management (Signed)
Cost of Lovenox is $ 5.00. Patient updated.

## 2017-12-12 NOTE — Progress Notes (Signed)
Patient ID: Nathan Chambers, male   DOB: 29-Feb-1966, 52 y.o.   MRN: [id number]  Diagnosis: MSSA left knee PJI   OPAT Orders Discharge antibiotics:  Cefazolin 2 grams IV every 8 hours until 01/22/18 Rifampin 319m Po Q 12 until 01/22/18  [facility]:  Labs weekly while on IV antibiotics: CBC with differential CMP CRP  ESR   _*_ Please pull PIC at completion of IV antibiotics  Call Dr.Jaycee Mckellips with any critical value or questions  [phone]  Can you scan and send me the lab results to [email or web address]  as I dont have a dedicated fax yet    Clinic Follow Up Appt to be decided.

## 2017-12-12 NOTE — Discharge Summary (Signed)
[facility] NAME: Nathan Chambers    MR#:  [id number]  DATE OF BIRTH:  02-10-1966  DATE OF ADMISSION:  12/08/2017   ADMITTING PHYSICIAN: Gorden Harms, MD  DATE OF DISCHARGE: 12/12/2017 12:12 PM  PRIMARY CARE PHYSICIAN: Juluis Pitch, MD   ADMISSION DIAGNOSIS:   Pyogenic arthritis of left knee joint, due to unspecified organism ([place]) [M00.9]  DISCHARGE DIAGNOSIS:   Active Problems:   Arthritis, septic, knee ([place])   SECONDARY DIAGNOSIS:   Past Medical History:  Diagnosis Date  . Anxiety   . Arthritis    knees, hands  . Bipolar disorder ([place])   . Chronic kidney disease    arf 2014 due to dehydration  . Headache   . Hemorrhoids   . Hyperlipidemia   . Hypertension   . LFT elevation   . NAFLD (nonalcoholic fatty liver disease)   . Sleep apnea     HOSPITAL COURSE:   52 year old male with past medical history significant for CKD, arthritis, bipolar and anxiety and history of bilateral total knee replacements presents to hospital secondary to septic arthritis of the left knee.  1.  Left prosthetic knee septic arthritis-appreciate Ortho consult.  Status post arthrotomy and I&D. - prior TKR in 2017 -Cultures growing MSSA -Appreciate ID consult. -Patient on Ancef 2g IV q8h until 01/22/18- 6 weeks and adjunctive rifampin for 6 weeks for now and then followed by oral keflex for 4 months - long term oral ABX for prosthetic joint infection -Some bleeding at the distal end of the sutures noted today.  Orthopedics aware, recommended bulky dressing and Ace wrap for reinforcement.  Likely secondary to anticoagulation.  No active bleeding noted.  2. HTN-Norvasc, benazepril, chlorthalidone  3.  Peripheral neuropathy-on gabapentin  4.  Depression and anxiety-continue home meds  5.  DVT prophylaxis-Lovenox for 14 days total after the procedure.  Recommended by orthopedics.  Will be discharged on Lovenox.  6.  Foot  infection-received Flagyl in the hospital.  Also on other antibiotics with Ancef and rifampin.  Wound cultures are pending.  Ambulatory at baseline Patient updated at discharge.  DISCHARGE CONDITIONS:   Guarded  CONSULTS OBTAINED:   Treatment Team:  Dereck Leep, MD Hessie Knows, MD Tsosie Billing, MD  DRUG ALLERGIES:   Allergies  Allergen Reactions  . Etodolac Itching   DISCHARGE MEDICATIONS:   Allergies as of 12/12/2017      Reactions   Etodolac Itching      Medication List    STOP taking these medications   aspirin 81 MG tablet   pravastatin 20 MG tablet Commonly known as:  PRAVACHOL     TAKE these medications   acidophilus Caps capsule Take 1 capsule by mouth daily. Start taking on:  12/13/2017   Alpha-Lipoic Acid 600 MG Caps Take 600 mg by mouth daily.   ALPRAZolam 0.25 MG tablet Commonly known as:  XANAX Take 0.5-1 tablets by mouth every 12 (twelve) hours as needed for anxiety.   amLODipine-benazepril 10-40 MG capsule Commonly known as:  LOTREL Take 1 capsule by mouth daily.   busPIRone 5 MG tablet Commonly known as:  BUSPAR Take 5 mg by mouth 2 (two) times daily.   ceFAZolin  IVPB Commonly known as:  ANCEF Inject 2 g into the vein every 8 (eight) hours. Indication:  MSSA prosthetic joint infectjion Last Day of Therapy:  01/22/2018 (to complete 6 weeks of therapy) Labs - Once weekly:  CBC/D and BMP,  Labs - Every other week:  ESR and CRP   chlorthalidone 25 MG tablet Commonly known as:  HYGROTON Take 12.5 mg by mouth daily.   enoxaparin 30 MG/0.3ML injection Commonly known as:  LOVENOX Inject 0.3 mLs (30 mg total) into the skin every 12 (twelve) hours for 10 days.   ferrous sulfate 325 (65 FE) MG tablet Take 1 tablet (325 mg total) by mouth 2 (two) times daily with a meal.   folic acid 1 MG tablet Commonly known as:  FOLVITE Take 1 mg by mouth daily.   gabapentin 600 MG tablet Commonly known as:  NEURONTIN Take 1 tablet  (600 mg total) by mouth 3 (three) times daily. What changed:  how much to take   gemfibrozil 600 MG tablet Commonly known as:  LOPID Take 600 mg by mouth 2 (two) times daily before a meal.   meloxicam 15 MG tablet Commonly known as:  MOBIC Take 15 mg by mouth daily.   multivitamin-lutein Caps capsule Take 1 capsule by mouth daily.   naloxone 0.4 MG/ML injection Commonly known as:  NARCAN Inject 1 mL (0.4 mg total) into the vein as needed (respiratory rate < 10).   oxyCODONE 5 MG immediate release tablet Commonly known as:  Oxy IR/ROXICODONE Take 1-2 tablets (5-10 mg total) by mouth every 6 (six) hours as needed for moderate pain or severe pain. What changed:    when to take this  reasons to take this   PARoxetine 20 MG tablet Commonly known as:  PAXIL Take 60 mg by mouth daily.   rifampin 300 MG capsule Commonly known as:  RIFADIN Take 1 capsule (300 mg total) by mouth every 12 (twelve) hours.   rOPINIRole 0.5 MG tablet Commonly known as:  REQUIP Take 0.5 mg by mouth at bedtime.   senna-docusate 8.6-50 MG tablet Commonly known as:  Senokot-S Take 1 tablet by mouth 2 (two) times daily.   vitamin B-12 1000 MCG tablet Commonly known as:  CYANOCOBALAMIN Take 1,000 mcg by mouth daily.   vitamin C 500 MG tablet Commonly known as:  ASCORBIC ACID Take 500 mg by mouth daily.            Home Infusion Instuctions  (From admission, onward)         Start     Ordered   12/11/17 0000  Home infusion instructions [facility] May follow [place] Dosing Protocol; May administer Cathflo as needed to maintain patency of vascular access device.; Flushing of vascular access device: per [facility] Protocol: 0.9% NaCl pre/post medica...    Question Answer Comment  Instructions May follow [place] Dosing Protocol   Instructions May administer Cathflo as needed to maintain patency of vascular access device.   Instructions Flushing of vascular access device: per [facility]  Protocol: 0.9% NaCl pre/post medication administration and prn patency; Heparin 100 u/ml, 3m for implanted ports and Heparin 10u/ml, 549mfor all other central venous catheters.   Instructions May follow [facility] Anaphylaxis Protocol for First Dose Administration in the home: 0.9% NaCl at 25-50 ml/hr to maintain IV access for protocol meds. Epinephrine 0.3 ml IV/IM PRN and Benadryl 25-50 IV/IM PRN s/s of anaphylaxis.   Instructions [facility] Infusion Coordinator (RN) to assist per patient IV care needs in the home PRN.      12/11/17 1638           Durable Medical Equipment  (From admission, onward)         Start  Ordered   12/08/17 2305  DME Walker rolling  Once    Question:  Patient needs a walker to treat with the following condition  Answer:  Total knee replacement status   12/08/17 2305   12/08/17 2305  DME Bedside commode  Once    Question:  Patient needs a bedside commode to treat with the following condition  Answer:  Total knee replacement status   12/08/17 2304           DISCHARGE INSTRUCTIONS:   1. PCP follow-up in 1 to 2 weeks 2.  Orthopedics follow-up within 1 week. 3.  Home health for IV antibiotics and dressing changes  DIET:   Cardiac diet  ACTIVITY:   Activity as tolerated  OXYGEN:   Home Oxygen: No.  Oxygen Delivery: room air  DISCHARGE LOCATION:   home   If you experience worsening of your admission symptoms, develop shortness of breath, life threatening emergency, suicidal or homicidal thoughts you must seek medical attention immediately by calling 911 or calling your MD immediately  if symptoms less severe.  You Must read complete instructions/literature along with all the possible adverse reactions/side effects for all the Medicines you take and that have been prescribed to you. Take any new Medicines after you have completely understood and accpet all the possible adverse reactions/side effects.   Please note  You were cared for by  a hospitalist during your hospital stay. If you have any questions about your discharge medications or the care you received while you were in the hospital after you are discharged, you can call the unit and asked to speak with the hospitalist on call if the hospitalist that took care of you is not available. Once you are discharged, your primary care physician will handle any further medical issues. Please note that NO REFILLS for any discharge medications will be authorized once you are discharged, as it is imperative that you return to your primary care physician (or establish a relationship with a primary care physician if you do not have one) for your aftercare needs so that they can reassess your need for medications and monitor your lab values.    On the day of Discharge:  VITAL SIGNS:   Blood pressure 129/87, pulse 89, temperature 98.1 F (36.7 C), temperature source Oral, resp. rate 18, height '6\' 3"'$  (1.905 m), weight (!) 152 kg, SpO2 99 %.  PHYSICAL EXAMINATION:    GENERAL:  52 y.o.-year-old obese patient lying in the bed with no acute distress.  EYES: Pupils equal, round, reactive to light and accommodation. No scleral icterus. Extraocular muscles intact.  HEENT: Head atraumatic, normocephalic. Oropharynx and nasopharynx clear.  NECK:  Supple, no jugular venous distention. No thyroid enlargement, no tenderness.  LUNGS: Normal breath sounds bilaterally, no wheezing, rales,rhonchi or crepitation. No use of accessory muscles of respiration.  CARDIOVASCULAR: S1, S2 normal. No murmurs, rubs, or gallops.  ABDOMEN: Soft, nontender, nondistended. Bowel sounds present. No organomegaly or mass.  EXTREMITIES: left knee with bulky dressing and Ace wrap -No pedal edema, cyanosis, or clubbing.  NEUROLOGIC: Cranial nerves II through XII are intact. Muscle strength 5/5 in all extremities. Sensation intact. Gait not checked.  PSYCHIATRIC: The patient is alert and oriented x 3.  SKIN: No obvious  rash, lesion, or ulcer.   DATA REVIEW:   CBC Recent Labs  Lab 12/11/17 0405  WBC 8.5  HGB 11.5*  HCT 33.1*  PLT 279    Chemistries  Recent Labs  Lab 12/11/17 0405  NA 137  K 3.2*  CL 96*  CO2 31  GLUCOSE 124*  BUN 12  CREATININE 0.97  CALCIUM 9.2     Microbiology Results  Results for orders placed or performed during the hospital encounter of 12/08/17  Body fluid culture     Status: None   Collection Time: 12/08/17 10:17 AM  Result Value Ref Range Status   Specimen Description   Final    KNEE Performed at [facility], [address], [place], [place] [address]    Special Requests   Final    Normal Performed at [facility], [address] [place], [place] [address]    Gram Stain   Final    MODERATE WBC PRESENT, PREDOMINANTLY PMN NO ORGANISMS SEEN    Culture   Final    FEW STAPHYLOCOCCUS AUREUS CRITICAL RESULT CALLED TO, READ BACK BY AND VERIFIED WITH: A. CAMPBELL RN, AT 1030 12/09/17 BY D. VANHOOK REGARDING CULTURE GROWTH Performed at [facility], [place] [address], [place], [place] [address]    Report Status 12/10/2017 FINAL  Final   Organism ID, Bacteria STAPHYLOCOCCUS AUREUS  Final      Susceptibility   Staphylococcus aureus - MIC*    CIPROFLOXACIN <=0.5 SENSITIVE Sensitive     ERYTHROMYCIN <=0.25 SENSITIVE Sensitive     GENTAMICIN <=0.5 SENSITIVE Sensitive     OXACILLIN <=0.25 SENSITIVE Sensitive     TETRACYCLINE <=1 SENSITIVE Sensitive     VANCOMYCIN <=0.5 SENSITIVE Sensitive     TRIMETH/SULFA <=10 SENSITIVE Sensitive     CLINDAMYCIN <=0.25 SENSITIVE Sensitive     RIFAMPIN <=0.5 SENSITIVE Sensitive     Inducible Clindamycin NEGATIVE Sensitive     * FEW STAPHYLOCOCCUS AUREUS  Surgical PCR screen     Status: Abnormal   Collection Time: 12/08/17  3:20 PM  Result Value Ref Range Status   MRSA, PCR NEGATIVE NEGATIVE Final   Staphylococcus aureus POSITIVE (A) NEGATIVE Final    Comment: (NOTE) The Xpert SA Assay (FDA  approved for NASAL specimens in patients 65 years of age and older), is one component of a comprehensive surveillance program. It is not intended to diagnose infection nor to guide or monitor treatment. Performed at [facility], [address] [place], [place] [address]   Aerobic/Anaerobic Culture (surgical/deep wound)     Status: None (Preliminary result)   Collection Time: 12/08/17  5:00 PM  Result Value Ref Range Status   Specimen Description   Final    TISSUE SUPRAPATELLA POUCH Performed at [facility], [address], [place], [place] [address]    Special Requests   Final    NONE Performed at [facility], [address] [place], [place] [address]    Gram Stain   Final    GRAM POSITIVE COCCI MANY WBC SEEN C/ KIMBERLY JOHNSON ON 12/08/17 AT [address] [facility] Performed at [facility], [address], [place], [place] [address]    Culture   Final    RARE STAPHYLOCOCCUS AUREUS NO ANAEROBES ISOLATED; CULTURE IN PROGRESS FOR 5 DAYS    Report Status PENDING  Incomplete   Organism ID, Bacteria STAPHYLOCOCCUS AUREUS  Final      Susceptibility   Staphylococcus aureus - MIC*    CIPROFLOXACIN <=0.5 SENSITIVE Sensitive     ERYTHROMYCIN <=0.25 SENSITIVE Sensitive     GENTAMICIN <=0.5 SENSITIVE Sensitive     OXACILLIN <=0.25 SENSITIVE Sensitive     TETRACYCLINE <=1 SENSITIVE Sensitive     VANCOMYCIN <=  0.5 SENSITIVE Sensitive     TRIMETH/SULFA <=10 SENSITIVE Sensitive     CLINDAMYCIN <=0.25 SENSITIVE Sensitive     RIFAMPIN <=0.5 SENSITIVE Sensitive     Inducible Clindamycin NEGATIVE Sensitive     * RARE STAPHYLOCOCCUS AUREUS  Aerobic/Anaerobic Culture (surgical/deep wound)     Status: None (Preliminary result)   Collection Time: 12/08/17  7:15 PM  Result Value Ref Range Status   Specimen Description   Final    WOUND POSTERIOR RECESS LEFT KNEE Performed at [facility], [address], [place], [place] [address]    Special Requests    Final    NONE Performed at [facility], [address] [place], [place] [address]    Gram Stain   Final    GRAM POSITIVE COCCI MODERATE WBC SEEN C/ KIMBERLY JOHNSON ON 12/08/17 AT 2028 [facility] Performed at [facility], [address] [place], [place] [address]    Culture   Final    MODERATE STAPHYLOCOCCUS AUREUS NO ANAEROBES ISOLATED; CULTURE IN PROGRESS FOR 5 DAYS    Report Status PENDING  Incomplete   Organism ID, Bacteria STAPHYLOCOCCUS AUREUS  Final      Susceptibility   Staphylococcus aureus - MIC*    CIPROFLOXACIN <=0.5 SENSITIVE Sensitive     ERYTHROMYCIN <=0.25 SENSITIVE Sensitive     GENTAMICIN <=0.5 SENSITIVE Sensitive     OXACILLIN <=0.25 SENSITIVE Sensitive     TETRACYCLINE <=1 SENSITIVE Sensitive     VANCOMYCIN <=0.5 SENSITIVE Sensitive     TRIMETH/SULFA <=10 SENSITIVE Sensitive     CLINDAMYCIN <=0.25 SENSITIVE Sensitive     RIFAMPIN <=0.5 SENSITIVE Sensitive     Inducible Clindamycin NEGATIVE Sensitive     * MODERATE STAPHYLOCOCCUS AUREUS  Culture, blood (Routine X 2) w Reflex to ID Panel     Status: None (Preliminary result)   Collection Time: 12/09/17  2:17 PM  Result Value Ref Range Status   Specimen Description BLOOD RIGHT ANTECUBITAL  Final   Special Requests   Final    BOTTLES DRAWN AEROBIC AND ANAEROBIC Blood Culture adequate volume   Culture   Final    NO GROWTH 3 DAYS Performed at [facility], [address], [place], [place] [address]    Report Status PENDING  Incomplete  Culture, blood (Routine X 2) w Reflex to ID Panel     Status: None (Preliminary result)   Collection Time: 12/09/17  2:25 PM  Result Value Ref Range Status   Specimen Description BLOOD BLOOD RIGHT HAND  Final   Special Requests   Final    BOTTLES DRAWN AEROBIC AND ANAEROBIC Blood Culture adequate volume   Culture   Final    NO GROWTH 3 DAYS Performed at [facility], [address] [place], [place] [address]    Report Status PENDING   Incomplete  Aerobic Culture (superficial specimen)     Status: None (Preliminary result)   Collection Time: 12/11/17  1:37 PM  Result Value Ref Range Status   Specimen Description   Final    LEG Performed at [facility], [address], [place], [place] [address]    Special Requests   Final    NONE Performed at [facility], [address] [place],  [address]    Gram Stain   Final    FEW WBC PRESENT,BOTH PMN AND MONONUCLEAR FEW GRAM POSITIVE COCCI    Culture   Final    CULTURE REINCUBATED FOR BETTER GROWTH Performed at [facility], [place] [address], [place], [place] [address]    Report Status PENDING  Incomplete    RADIOLOGY:  No results found.   Management plans discussed with the patient, family and they are in agreement.  CODE STATUS:     Code Status Orders  (From admission, onward)         Start     Ordered   12/08/17 1515  Full code  Continuous     12/08/17 1514        Code Status History    Date Active Date Inactive Code Status Order ID Comments User Context   04/19/2016 1346 04/22/2016 2102 Full Code [id number]  Hessie Knows, MD Inpatient   10/30/2015 1136 10/30/2015 1524 Full Code [id number]  [name], DPM Inpatient      TOTAL TIME TAKING CARE OF THIS PATIENT: 38  minutes.    Gladstone Lighter M.D on 12/12/2017 at 2:15 PM  Between 7am to 6pm - Pager - [phone]  After 6pm go to [email or web address] - Proofreader  [facility]  Office  [phone]  CC: Primary care physician; Juluis Pitch, MD   Note: This dictation was prepared with Dragon dictation along with smaller phrase technology. Any transcriptional errors that result from this process are unintentional.

## 2017-12-13 DIAGNOSIS — T8454XA Infection and inflammatory reaction due to internal left knee prosthesis, initial encounter: Secondary | ICD-10-CM | POA: Diagnosis not present

## 2017-12-14 LAB — AEROBIC/ANAEROBIC CULTURE (SURGICAL/DEEP WOUND)

## 2017-12-14 LAB — AEROBIC CULTURE  (SUPERFICIAL SPECIMEN)

## 2017-12-14 LAB — AEROBIC CULTURE W GRAM STAIN (SUPERFICIAL SPECIMEN)

## 2017-12-14 LAB — CULTURE, BLOOD (ROUTINE X 2)
Culture: NO GROWTH
Culture: NO GROWTH
SPECIAL REQUESTS: ADEQUATE
SPECIAL REQUESTS: ADEQUATE

## 2017-12-14 LAB — AEROBIC/ANAEROBIC CULTURE W GRAM STAIN (SURGICAL/DEEP WOUND)

## 2017-12-15 DIAGNOSIS — T8454XA Infection and inflammatory reaction due to internal left knee prosthesis, initial encounter: Secondary | ICD-10-CM | POA: Diagnosis not present

## 2017-12-18 DIAGNOSIS — T8454XA Infection and inflammatory reaction due to internal left knee prosthesis, initial encounter: Secondary | ICD-10-CM | POA: Diagnosis not present

## 2017-12-18 DIAGNOSIS — Z792 Long term (current) use of antibiotics: Secondary | ICD-10-CM | POA: Diagnosis not present

## 2017-12-18 DIAGNOSIS — M00062 Staphylococcal arthritis, left knee: Secondary | ICD-10-CM | POA: Diagnosis not present

## 2017-12-18 DIAGNOSIS — B9561 Methicillin susceptible Staphylococcus aureus infection as the cause of diseases classified elsewhere: Secondary | ICD-10-CM | POA: Diagnosis not present

## 2017-12-21 DIAGNOSIS — G8911 Acute pain due to trauma: Secondary | ICD-10-CM | POA: Diagnosis not present

## 2017-12-21 DIAGNOSIS — A4901 Methicillin susceptible Staphylococcus aureus infection, unspecified site: Secondary | ICD-10-CM | POA: Diagnosis not present

## 2017-12-21 DIAGNOSIS — M02069 Arthropathy following intestinal bypass, unspecified knee: Secondary | ICD-10-CM | POA: Diagnosis not present

## 2017-12-25 DIAGNOSIS — T8454XA Infection and inflammatory reaction due to internal left knee prosthesis, initial encounter: Secondary | ICD-10-CM | POA: Diagnosis not present

## 2017-12-28 DIAGNOSIS — M02069 Arthropathy following intestinal bypass, unspecified knee: Secondary | ICD-10-CM | POA: Diagnosis not present

## 2017-12-28 DIAGNOSIS — A4901 Methicillin susceptible Staphylococcus aureus infection, unspecified site: Secondary | ICD-10-CM | POA: Diagnosis not present

## 2017-12-28 DIAGNOSIS — G5621 Lesion of ulnar nerve, right upper limb: Secondary | ICD-10-CM | POA: Diagnosis not present

## 2017-12-28 DIAGNOSIS — G8911 Acute pain due to trauma: Secondary | ICD-10-CM | POA: Diagnosis not present

## 2017-12-28 DIAGNOSIS — G5603 Carpal tunnel syndrome, bilateral upper limbs: Secondary | ICD-10-CM | POA: Diagnosis not present

## 2017-12-28 DIAGNOSIS — G608 Other hereditary and idiopathic neuropathies: Secondary | ICD-10-CM | POA: Diagnosis not present

## 2017-12-29 DIAGNOSIS — T8454XA Infection and inflammatory reaction due to internal left knee prosthesis, initial encounter: Secondary | ICD-10-CM | POA: Diagnosis not present

## 2017-12-29 DIAGNOSIS — A419 Sepsis, unspecified organism: Secondary | ICD-10-CM | POA: Diagnosis not present

## 2017-12-29 DIAGNOSIS — E669 Obesity, unspecified: Secondary | ICD-10-CM | POA: Diagnosis not present

## 2018-01-01 ENCOUNTER — Other Ambulatory Visit
Admission: RE | Admit: 2018-01-01 | Discharge: 2018-01-01 | Disposition: A | Discharge status: Home / Self Care | Payer: 59 | Source: Ambulatory Visit | Attending: Orthopedic Surgery | Admitting: Orthopedic Surgery

## 2018-01-01 DIAGNOSIS — X58XXXA Exposure to other specified factors, initial encounter: Secondary | ICD-10-CM | POA: Insufficient documentation

## 2018-01-01 DIAGNOSIS — M00062 Staphylococcal arthritis, left knee: Secondary | ICD-10-CM | POA: Diagnosis not present

## 2018-01-01 DIAGNOSIS — T8454XA Infection and inflammatory reaction due to internal left knee prosthesis, initial encounter: Secondary | ICD-10-CM | POA: Insufficient documentation

## 2018-01-01 LAB — CBC WITH DIFFERENTIAL/PLATELET
BASOS ABS: 0.1 10*3/uL (ref 0–0.1)
BASOS PCT: 1 %
EOS PCT: 5 %
Eosinophils Absolute: 0.4 10*3/uL (ref 0–0.7)
HCT: 38.2 % — ABNORMAL LOW (ref 40.0–52.0)
Hemoglobin: 13.2 g/dL (ref 13.0–18.0)
Lymphocytes Relative: 22 %
Lymphs Abs: 1.8 10*3/uL (ref 1.0–3.6)
MCH: 31.9 pg (ref 26.0–34.0)
MCHC: 34.5 g/dL (ref 32.0–36.0)
MCV: 92.7 fL (ref 80.0–100.0)
MONO ABS: 0.6 10*3/uL (ref 0.2–1.0)
Monocytes Relative: 8 %
Neutro Abs: 5.2 10*3/uL (ref 1.4–6.5)
Neutrophils Relative %: 64 %
PLATELETS: 429 10*3/uL (ref 150–440)
RBC: 4.13 MIL/uL — AB (ref 4.40–5.90)
RDW: 13.8 % (ref 11.5–14.5)
WBC: 8.1 10*3/uL (ref 3.8–10.6)

## 2018-01-01 LAB — BASIC METABOLIC PANEL
ANION GAP: 13 (ref 5–15)
BUN: 10 mg/dL (ref 6–20)
CALCIUM: 9.5 mg/dL (ref 8.9–10.3)
CO2: 24 mmol/L (ref 22–32)
Chloride: 101 mmol/L (ref 98–111)
Creatinine, Ser: 0.8 mg/dL (ref 0.61–1.24)
GFR calc Af Amer: >60 mL/min (ref >60)
GLUCOSE: 79 mg/dL (ref 70–99)
POTASSIUM: 3.5 mmol/L (ref 3.5–5.1)
SODIUM: 138 mmol/L (ref 135–145)

## 2018-01-01 LAB — SEDIMENTATION RATE: SED RATE: 69 mm/h — AB (ref 0–20)

## 2018-01-01 LAB — C-REACTIVE PROTEIN: CRP: 2.7 mg/dL — AB (ref <1.0)

## 2018-01-04 DIAGNOSIS — A4901 Methicillin susceptible Staphylococcus aureus infection, unspecified site: Secondary | ICD-10-CM | POA: Diagnosis not present

## 2018-01-04 DIAGNOSIS — M02069 Arthropathy following intestinal bypass, unspecified knee: Secondary | ICD-10-CM | POA: Diagnosis not present

## 2018-01-04 DIAGNOSIS — G8911 Acute pain due to trauma: Secondary | ICD-10-CM | POA: Diagnosis not present

## 2018-01-08 DIAGNOSIS — M009 Pyogenic arthritis, unspecified: Secondary | ICD-10-CM | POA: Diagnosis not present

## 2018-01-08 DIAGNOSIS — I10 Essential (primary) hypertension: Secondary | ICD-10-CM | POA: Diagnosis not present

## 2018-01-08 DIAGNOSIS — A4901 Methicillin susceptible Staphylococcus aureus infection, unspecified site: Secondary | ICD-10-CM | POA: Diagnosis not present

## 2018-01-08 DIAGNOSIS — E669 Obesity, unspecified: Secondary | ICD-10-CM | POA: Diagnosis not present

## 2018-01-08 DIAGNOSIS — T8454XA Infection and inflammatory reaction due to internal left knee prosthesis, initial encounter: Secondary | ICD-10-CM | POA: Diagnosis not present

## 2018-01-12 DIAGNOSIS — G8911 Acute pain due to trauma: Secondary | ICD-10-CM | POA: Diagnosis not present

## 2018-01-12 DIAGNOSIS — A4901 Methicillin susceptible Staphylococcus aureus infection, unspecified site: Secondary | ICD-10-CM | POA: Diagnosis not present

## 2018-01-12 DIAGNOSIS — M02069 Arthropathy following intestinal bypass, unspecified knee: Secondary | ICD-10-CM | POA: Diagnosis not present

## 2018-01-15 DIAGNOSIS — I10 Essential (primary) hypertension: Secondary | ICD-10-CM | POA: Diagnosis not present

## 2018-01-15 DIAGNOSIS — T8454XA Infection and inflammatory reaction due to internal left knee prosthesis, initial encounter: Secondary | ICD-10-CM | POA: Diagnosis not present

## 2018-01-15 DIAGNOSIS — E669 Obesity, unspecified: Secondary | ICD-10-CM | POA: Diagnosis not present

## 2018-01-22 DIAGNOSIS — M00062 Staphylococcal arthritis, left knee: Secondary | ICD-10-CM | POA: Diagnosis not present

## 2018-01-22 DIAGNOSIS — T8454XA Infection and inflammatory reaction due to internal left knee prosthesis, initial encounter: Secondary | ICD-10-CM | POA: Diagnosis not present

## 2018-01-22 DIAGNOSIS — E669 Obesity, unspecified: Secondary | ICD-10-CM | POA: Diagnosis not present

## 2018-01-23 ENCOUNTER — Other Ambulatory Visit: Payer: Self-pay | Admitting: Infectious Diseases

## 2018-01-23 DIAGNOSIS — M02069 Arthropathy following intestinal bypass, unspecified knee: Secondary | ICD-10-CM | POA: Diagnosis not present

## 2018-01-23 DIAGNOSIS — G8911 Acute pain due to trauma: Secondary | ICD-10-CM | POA: Diagnosis not present

## 2018-01-23 DIAGNOSIS — A4901 Methicillin susceptible Staphylococcus aureus infection, unspecified site: Secondary | ICD-10-CM | POA: Diagnosis not present

## 2018-01-29 ENCOUNTER — Encounter: Payer: Self-pay | Admitting: Infectious Diseases

## 2018-01-29 ENCOUNTER — Ambulatory Visit: Payer: 59 | Attending: Infectious Diseases | Admitting: Infectious Diseases

## 2018-01-29 VITALS — BP 138/87 | HR 78 | Temp 99.1°F

## 2018-01-29 DIAGNOSIS — A4901 Methicillin susceptible Staphylococcus aureus infection, unspecified site: Secondary | ICD-10-CM | POA: Diagnosis not present

## 2018-01-29 DIAGNOSIS — Z96659 Presence of unspecified artificial knee joint: Secondary | ICD-10-CM

## 2018-01-29 DIAGNOSIS — E785 Hyperlipidemia, unspecified: Secondary | ICD-10-CM | POA: Insufficient documentation

## 2018-01-29 DIAGNOSIS — Z79899 Other long term (current) drug therapy: Secondary | ICD-10-CM | POA: Insufficient documentation

## 2018-01-29 DIAGNOSIS — T8454XD Infection and inflammatory reaction due to internal left knee prosthesis, subsequent encounter: Secondary | ICD-10-CM | POA: Diagnosis not present

## 2018-01-29 DIAGNOSIS — Y838 Other surgical procedures as the cause of abnormal reaction of the patient, or of later complication, without mention of misadventure at the time of the procedure: Secondary | ICD-10-CM | POA: Diagnosis not present

## 2018-01-29 DIAGNOSIS — Z95828 Presence of other vascular implants and grafts: Secondary | ICD-10-CM

## 2018-01-29 DIAGNOSIS — F419 Anxiety disorder, unspecified: Secondary | ICD-10-CM | POA: Insufficient documentation

## 2018-01-29 DIAGNOSIS — F319 Bipolar disorder, unspecified: Secondary | ICD-10-CM | POA: Insufficient documentation

## 2018-01-29 DIAGNOSIS — K760 Fatty (change of) liver, not elsewhere classified: Secondary | ICD-10-CM | POA: Diagnosis not present

## 2018-01-29 DIAGNOSIS — Z888 Allergy status to other drugs, medicaments and biological substances status: Secondary | ICD-10-CM | POA: Diagnosis not present

## 2018-01-29 DIAGNOSIS — G4730 Sleep apnea, unspecified: Secondary | ICD-10-CM | POA: Diagnosis not present

## 2018-01-29 DIAGNOSIS — Z791 Long term (current) use of non-steroidal anti-inflammatories (NSAID): Secondary | ICD-10-CM | POA: Diagnosis not present

## 2018-01-29 DIAGNOSIS — T8454XA Infection and inflammatory reaction due to internal left knee prosthesis, initial encounter: Secondary | ICD-10-CM | POA: Insufficient documentation

## 2018-01-29 DIAGNOSIS — M00062 Staphylococcal arthritis, left knee: Secondary | ICD-10-CM

## 2018-01-29 DIAGNOSIS — Z96653 Presence of artificial knee joint, bilateral: Secondary | ICD-10-CM

## 2018-01-29 DIAGNOSIS — T8459XD Infection and inflammatory reaction due to other internal joint prosthesis, subsequent encounter: Secondary | ICD-10-CM

## 2018-01-29 DIAGNOSIS — Z792 Long term (current) use of antibiotics: Secondary | ICD-10-CM

## 2018-01-29 DIAGNOSIS — I10 Essential (primary) hypertension: Secondary | ICD-10-CM | POA: Diagnosis not present

## 2018-01-29 DIAGNOSIS — B9561 Methicillin susceptible Staphylococcus aureus infection as the cause of diseases classified elsewhere: Secondary | ICD-10-CM | POA: Diagnosis not present

## 2018-01-29 MED ORDER — SULFAMETHOXAZOLE-TRIMETHOPRIM 800-160 MG PO TABS: 1.0000 | ORAL_TABLET | Freq: Two times a day (BID) | ORAL | 1 refills | Status: DC

## 2018-01-29 MED ORDER — RIFAMPIN 300 MG PO CAPS: 300.0000 mg | ORAL_CAPSULE | Freq: Two times a day (BID) | ORAL | 1 refills | Status: DC

## 2018-01-29 NOTE — Progress Notes (Signed)
NAME: Nathan Chambers  DOB: 17-Dec-1965  MRN: [id number]  Date/Time: 01/29/2018 11:32 AM Subjective:  Follow up visit ? Nathan Chambers is a 52 y.o. with a history of bilateral total knee arthroplasty hypertension, hyperlipidemia and nonalcoholic fatty liver disease. Recenlty in [facility] for Left prosthetic knee joint infection with MSSA is here for follow up after hospital discharge. He was in [facility] between 12/08/17 and 12/12/17 for left knee pain and swelling following an injury he sustained by weed mover to the left calf. The left knee was aspirated and synovial fluid showed 53,293 WBC with 48% neutrophils and 15% lymphocytes and 37 percent mono.  Patient had left knee arthrotomy with irrigation and debridement and polyethylene exchange and placement of antibiotic impregnated bio composite beats on 12/08/2017 by Dr. Marry Guan.  .  The synovial culture came back as staph aureus and he was discharged home on IV cefazolin and PO rifampin to complete 6 weeks IV. He has been doing very well. He is able to ambulate. No fever or chills He has no side effects from meds Past Medical History:  Diagnosis Date  . Anxiety   . Arthritis    knees, hands  . Bipolar disorder ([place])   . Chronic kidney disease    arf 2014 due to dehydration  . Headache   . Hemorrhoids   . Hyperlipidemia   . Hypertension   . LFT elevation   . NAFLD (nonalcoholic fatty liver disease)   . Sleep apnea     Past Surgical History:  Procedure Laterality Date  . ACHILLES TENDON SURGERY Right 10/30/2015   Procedure: ACHILLES TENDON REPAIR/ HEGLAND OSTECTOMY;  Surgeon: [name], DPM;  Location: [facility] ORS;  Service: Podiatry;  Laterality: Right;  . COLONOSCOPY    . COLONOSCOPY WITH PROPOFOL N/A 04/07/2017   Procedure: COLONOSCOPY WITH PROPOFOL;  Surgeon: Toledo, Benay Pike, MD;  Location: [facility] ENDOSCOPY;  Service: Gastroenterology;  Laterality: N/A;  . FRACTURE SURGERY Left    MIDDLE FINGER  . HERNIA REPAIR    . I&D KNEE WITH POLY EXCHANGE  Left 12/08/2017   Procedure: IRRIGATION AND DEBRIDEMENT KNEE WITH POLY EXCHANGE;  Surgeon: Dereck Leep, MD;  Location: [facility] ORS;  Service: Orthopedics;  Laterality: Left;  . JOINT REPLACEMENT    . KNEE ARTHROTOMY Left 12/08/2017   Procedure: KNEE ARTHROTOMY, I&D, [place] OUT, POLY EXCHANGE;  Surgeon: Dereck Leep, MD;  Location: [facility] ORS;  Service: Orthopedics;  Laterality: Left;  . LIPOMA EXCISION    . TOTAL KNEE ARTHROPLASTY Bilateral 04/19/2016   Procedure: TOTAL KNEE BILATERAL;  Surgeon: Hessie Knows, MD;  Location: [facility] ORS;  Service: Orthopedics;  Laterality: Bilateral;  . VASECTOMY      Social History   Socioeconomic History  . Marital status: Married    Spouse name: Not on file  . Number of children: Not on file  . Years of education: Not on file  . Highest education level: Not on file  Occupational History  . Not on file  Social Needs  . Financial resource strain: Somewhat hard  . Food insecurity:    Worry: Never true    Inability: Never true  . Transportation needs:    Medical: Patient refused    Non-medical: Patient refused  Tobacco Use  . Smoking status: Never Smoker  . Smokeless tobacco: Former Systems developer    Types: Chew  Substance and Sexual Activity  . Alcohol use: No  . Drug use: No  . Sexual activity: Yes  Lifestyle  . Physical activity:  Days per week: 1 day    Minutes per session: 30 min  . Stress: To some extent  Relationships  . Social connections:    Talks on phone: More than three times a week    Gets together: More than three times a week    Attends religious service: Never    Active member of club or organization: No    Attends meetings of clubs or organizations: Never    Relationship status: Married  . Intimate partner violence:    Fear of current or ex partner: Patient refused    Emotionally abused: Patient refused    Physically abused: Patient refused    Forced sexual activity: Patient refused  Other Topics Concern  . Not on file  Social  History Narrative  . Not on file    History reviewed. No pertinent family history. Allergies  Allergen Reactions  . Etodolac Itching  ? Current Outpatient Medications  Medication Sig Dispense Refill  . acidophilus (RISAQUAD) CAPS capsule Take 1 capsule by mouth daily. 30 capsule 1  . Alpha-Lipoic Acid 600 MG CAPS Take 600 mg by mouth daily.    Marland Kitchen ALPRAZolam (XANAX) 0.25 MG tablet Take 0.5-1 tablets by mouth every 12 (twelve) hours as needed for anxiety.  0  . amLODipine-benazepril (LOTREL) 10-40 MG capsule Take 1 capsule by mouth daily.    . busPIRone (BUSPAR) 5 MG tablet Take 5 mg by mouth 2 (two) times daily.    . chlorthalidone (HYGROTON) 25 MG tablet Take 12.5 mg by mouth daily.    Marland Kitchen enoxaparin (LOVENOX) 30 MG/0.3ML injection Inject 0.3 mLs (30 mg total) into the skin every 12 (twelve) hours for 10 days. 3.3 mL 0  . ferrous sulfate 325 (65 FE) MG tablet Take 1 tablet (325 mg total) by mouth 2 (two) times daily with a meal. 30 tablet 0  . folic acid (FOLVITE) 1 MG tablet Take 1 mg by mouth daily.     Marland Kitchen gabapentin (NEURONTIN) 600 MG tablet Take 1 tablet (600 mg total) by mouth 3 (three) times daily. 60 tablet 0  . gemfibrozil (LOPID) 600 MG tablet Take 600 mg by mouth 2 (two) times daily before a meal.    . meloxicam (MOBIC) 15 MG tablet Take 15 mg by mouth daily.    . multivitamin-lutein (OCUVITE-LUTEIN) CAPS capsule Take 1 capsule by mouth daily.    . naloxone (NARCAN) 0.4 MG/ML injection Inject 1 mL (0.4 mg total) into the vein as needed (respiratory rate < 10). 1 mL 0  . oxyCODONE (OXY IR/ROXICODONE) 5 MG immediate release tablet Take 1-2 tablets (5-10 mg total) by mouth every 6 (six) hours as needed for moderate pain or severe pain. 25 tablet 0  . PARoxetine (PAXIL) 20 MG tablet Take 60 mg by mouth daily.     Marland Kitchen rOPINIRole (REQUIP) 0.5 MG tablet Take 0.5 mg by mouth at bedtime.  0  . senna-docusate (SENOKOT-S) 8.6-50 MG tablet Take 1 tablet by mouth 2 (two) times daily. 60 tablet 0   . vitamin B-12 (CYANOCOBALAMIN) 1000 MCG tablet Take 1,000 mcg by mouth daily.     . vitamin C (ASCORBIC ACID) 500 MG tablet Take 500 mg by mouth daily.     No current facility-administered medications for this visit.     REVIEW OF SYSTEMS:  Const: negative fever, negative chills, negative weight loss Eyes: negative diplopia or visual changes, negative eye pain ENT: negative coryza, negative sore throat Resp: negative cough, hemoptysis, dyspnea Cards: negative for chest  pain, palpitations, lower extremity edema GU: negative for frequency, dysuria and hematuria GI: Negative for abdominal apin, diarrhea, bleeding, constipation Skin: negative for rash and pruritus Heme: negative for easy bruising and gum/nose bleeding MS: swelling left knee, some stiffness Neurolo:negative for headaches, dizziness, vertigo, memory problems  Psych: negative for feelings of anxiety, depression  Endocrine: negative for thyroid, diabetes Allergy/Immunology- etodolac?  Objective:  VITALS:  BP 138/87 (BP Location: Left Arm, Patient Position: Sitting, Cuff Size: Large)   Pulse 78   Temp 99.1 F (37.3 C) (Oral)  PHYSICAL EXAM:  General: Alert, cooperative, no distress, appears stated age.  Head: Normocephalic, without obvious abnormality, atraumatic. Eyes: Conjunctivae clear, anicteric sclerae. Pupils are equal ENT Nares normal. No drainage or sinus tenderness. Lips, mucosa, and tongue normal. No Thrush Neck: Supple, symmetrical, no adenopathy, thyroid: non tender no carotid bruit and no JVD. Back: No CVA tenderness. Lungs: Clear to auscultation bilaterally. No Wheezing or Rhonchi. No rales. Heart: Regular rate and rhythm, no murmur, rub or gallop. Abdomen: Soft, non-tender,not distended. Bowel sounds normal. No masses Extremities: b/l kne scar- left knee mildly swollen- no erythema or tenderness atraumatic, no cyanosis. No edema. No clubbing Skin: No rashes or lesions. Or bruising Lymph: Cervical,  supraclavicular normal. Neurologic: Grossly non-focal Pertinent Labs Cr 0.89 Wbc 7.0 Hb 13.1 ESR 66 CRP 9 ( 0-10) IMAGING RESULTS: None  ? Impression/Recommendation  ?Left prosthetic joint infection of the knee due to methicillin sensitive staph aureus. source was the wound on the left leg sustained due to weed mover. He has had I&D with debridement and irrigation and  polyethylene exchange.  Hardware is still present in the knee.  He received 7 weeks of IV cefazolin 2grams every 8 hrs and p.o. rifampin '300mg'$  PO BID . The IV can be stooped and PICC can be removed. He will  need antistaphylococcal oral antibiotics bactrim+ rifampin for another 4 months as per the guidelines for prosthetic knee infection with retained hardware. Because of rifampin he may have interactions with quite a few medications and pravastatin is 1 of them.  It has been stopped.  There are some other medications with effect may be less because of rifampin. Continue probiotic. Has been getitng weekly labs and his last ESR is 66. This can now be changed to once in 2 weeks and with bactrim will check K. Cr in 2 weeks and if okay can be moved to once a month. Saw Ortho as Op and has been discharged from their clinic  Left leg wound on the posterior aspect.  had MSSA- has healed completely  Acute kidney injury secondary to dehydration and infection.  Has resolved.     Hypertension on Lotrel and chlorthalidone.  Hyperlipidemia on Pravachol and gemfibrozil.  Pravachol has been discontinued because of rifampin.  Nonalcoholic fatty liver disease.  Watch LFTs closely while on rifampin. ?Discussed with aptient and his wife in great detail

## 2018-01-30 ENCOUNTER — Telehealth: Payer: Self-pay | Admitting: Licensed Clinical Social Worker

## 2018-01-30 ENCOUNTER — Ambulatory Visit: Payer: 59 | Admitting: Physical Therapy

## 2018-01-30 NOTE — Telephone Encounter (Signed)
[place] to give orders to have IV therapy ended effective 01/30/2018 and PICC line removed, per Dr. Delaine Lame.

## 2018-02-01 ENCOUNTER — Ambulatory Visit: Payer: 59 | Admitting: Physical Therapy

## 2018-02-05 ENCOUNTER — Ambulatory Visit: Payer: 59

## 2018-02-06 ENCOUNTER — Ambulatory Visit: Payer: 59

## 2018-02-08 ENCOUNTER — Ambulatory Visit: Payer: 59 | Admitting: Physical Therapy

## 2018-02-12 ENCOUNTER — Other Ambulatory Visit
Admission: RE | Admit: 2018-02-12 | Discharge: 2018-02-12 | Disposition: A | Discharge status: Home / Self Care | Payer: 59 | Source: Ambulatory Visit | Attending: Infectious Diseases | Admitting: Infectious Diseases

## 2018-02-12 DIAGNOSIS — Z96659 Presence of unspecified artificial knee joint: Secondary | ICD-10-CM | POA: Diagnosis not present

## 2018-02-12 DIAGNOSIS — T8459XD Infection and inflammatory reaction due to other internal joint prosthesis, subsequent encounter: Secondary | ICD-10-CM | POA: Insufficient documentation

## 2018-02-12 LAB — COMPREHENSIVE METABOLIC PANEL
ALT: 33 U/L (ref 0–44)
ANION GAP: 12 (ref 5–15)
AST: 44 U/L — ABNORMAL HIGH (ref 15–41)
Albumin: 4.3 g/dL (ref 3.5–5.0)
Alkaline Phosphatase: 79 U/L (ref 38–126)
BUN: 7 mg/dL (ref 6–20)
CHLORIDE: 99 mmol/L (ref 98–111)
CO2: 28 mmol/L (ref 22–32)
CREATININE: 0.9 mg/dL (ref 0.61–1.24)
Calcium: 9.7 mg/dL (ref 8.9–10.3)
Glucose, Bld: 127 mg/dL — ABNORMAL HIGH (ref 70–99)
POTASSIUM: 4 mmol/L (ref 3.5–5.1)
Sodium: 139 mmol/L (ref 135–145)
Total Bilirubin: 0.6 mg/dL (ref 0.3–1.2)
Total Protein: 8.4 g/dL — ABNORMAL HIGH (ref 6.5–8.1)

## 2018-02-12 LAB — CBC WITH DIFFERENTIAL/PLATELET
Abs Immature Granulocytes: 0.02 10*3/uL (ref 0.00–0.07)
BASOS ABS: 0.1 10*3/uL (ref 0.0–0.1)
Basophils Relative: 1 %
EOS PCT: 8 %
Eosinophils Absolute: 0.6 10*3/uL — ABNORMAL HIGH (ref 0.0–0.5)
HCT: 43.5 % (ref 39.0–52.0)
HEMOGLOBIN: 14.3 g/dL (ref 13.0–17.0)
Immature Granulocytes: 0 %
LYMPHS PCT: 25 %
Lymphs Abs: 1.7 10*3/uL (ref 0.7–4.0)
MCH: 29.7 pg (ref 26.0–34.0)
MCHC: 32.9 g/dL (ref 30.0–36.0)
MCV: 90.4 fL (ref 80.0–100.0)
Monocytes Absolute: 0.5 10*3/uL (ref 0.1–1.0)
Monocytes Relative: 8 %
NRBC: 0 % (ref 0.0–0.2)
Neutro Abs: 4 10*3/uL (ref 1.7–7.7)
Neutrophils Relative %: 58 %
Platelets: 293 10*3/uL (ref 150–400)
RBC: 4.81 MIL/uL (ref 4.22–5.81)
RDW: 12.7 % (ref 11.5–15.5)
WBC: 7 10*3/uL (ref 4.0–10.5)

## 2018-02-12 LAB — SEDIMENTATION RATE: SED RATE: 30 mm/h — AB (ref 0–20)

## 2018-02-13 ENCOUNTER — Ambulatory Visit: Payer: 59

## 2018-02-15 ENCOUNTER — Ambulatory Visit: Payer: 59

## 2018-02-15 ENCOUNTER — Ambulatory Visit: Payer: 59 | Attending: Neurology

## 2018-02-15 DIAGNOSIS — R2681 Unsteadiness on feet: Secondary | ICD-10-CM | POA: Diagnosis not present

## 2018-02-15 DIAGNOSIS — M6281 Muscle weakness (generalized): Secondary | ICD-10-CM | POA: Insufficient documentation

## 2018-02-15 NOTE — Therapy (Signed)
[place] MAIN [facility] SERVICES [address] [place], [place], [address] Phone: [phone]   Fax:  [phone]  Physical Therapy Evaluation  Patient Details  Name: Nathan Chambers MRN: [id number] Date of Birth: [birth year] Referring Provider (PT): Dr. Manuella Ghazi   Encounter Date: 02/15/2018  PT End of Session - 02/15/18 0926    Visit Number  1    Number of Visits  1    Date for PT Re-Evaluation  02/22/18    Authorization Type  Progress note 1/10    Authorization Time Period  --    PT Start Time  0930    PT Stop Time  1030    PT Time Calculation (min)  60 min    Equipment Utilized During Treatment  Gait belt    Activity Tolerance  Patient tolerated treatment well    Behavior During Therapy  [facility] for tasks assessed/performed       Past Medical History:  Diagnosis Date  . Anxiety   . Arthritis    knees, hands  . Bipolar disorder ([place])   . Chronic kidney disease    arf 2014 due to dehydration  . Headache   . Hemorrhoids   . Hyperlipidemia   . Hypertension   . LFT elevation   . NAFLD (nonalcoholic fatty liver disease)   . Sleep apnea     Past Surgical History:  Procedure Laterality Date  . ACHILLES TENDON SURGERY Right 10/30/2015   Procedure: ACHILLES TENDON REPAIR/ HEGLAND OSTECTOMY;  Surgeon: [name], DPM;  Location: [facility] ORS;  Service: Podiatry;  Laterality: Right;  . COLONOSCOPY    . COLONOSCOPY WITH PROPOFOL N/A 04/07/2017   Procedure: COLONOSCOPY WITH PROPOFOL;  Surgeon: Toledo, Benay Pike, MD;  Location: [facility] ENDOSCOPY;  Service: Gastroenterology;  Laterality: N/A;  . FRACTURE SURGERY Left    MIDDLE FINGER  . HERNIA REPAIR    . I&D KNEE WITH POLY EXCHANGE Left 12/08/2017   Procedure: IRRIGATION AND DEBRIDEMENT KNEE WITH POLY EXCHANGE;  Surgeon: Dereck Leep, MD;  Location: [facility] ORS;  Service: Orthopedics;  Laterality: Left;  . JOINT REPLACEMENT    . KNEE ARTHROTOMY Left 12/08/2017   Procedure: KNEE ARTHROTOMY, I&D, Mark  OUT, POLY EXCHANGE;  Surgeon: Dereck Leep, MD;  Location: [facility] ORS;  Service: Orthopedics;  Laterality: Left;  . LIPOMA EXCISION    . TOTAL KNEE ARTHROPLASTY Bilateral 04/19/2016   Procedure: TOTAL KNEE BILATERAL;  Surgeon: Hessie Knows, MD;  Location: [facility] ORS;  Service: Orthopedics;  Laterality: Bilateral;  . VASECTOMY      There were no vitals filed for this visit.   Subjective Assessment - 02/15/18 0925    Subjective  Polyneuropathy    Pertinent History  History of simultaneous bilateral knee replacement in 2017. He cut his L calf with a weedeater 5-6 months ago and pt reports that it introduced staph bacteria into his LLE which spread to his L knee. His L knee was opened, cleaned, and packed with antibacterial beads. He had one visit with [facility] PT to review exercises but he declined any further therapy. He was referred today by Dr. Manuella Ghazi due to "dizziness and falling." Per neurology note patient reports that his symptoms started November 2018. Patient has burning, tingling, pins and needles, aching in his bilateral hands and feet, worse in the feet. He states that he can't stand the feeling of socks on his feet. He denies any feelings of LE weakness. Patient symptoms came on gradually and got worse. Patient  states that the gabapentin is not helping with his foot numbness. He was taking 1200mg  TID but it wasn't helping. He has elevated HgA1c but denies history of diabetes. Patient has history of three episodes of acute renal failure due to dehydration. He also reports that he sweats a lot. Patient has no known vitamin B12 deficiency, no known autoimmune disease. Patient does not drinks alcohol at all. Patient has regular family stress. Patient currently on oral antibiotics due to L knee infection. Patient has family history of neuropathy and diabetes. He has some back pain but this has improved recently. He reports some dizziness and describes it as feeling "drunk." He has a hard time describing  exactly how he feels but reports that he feels like his vision is getting dark from the periphery and closing in on him. He has "awful headaches" but states that he has never been diagnosed with migraines. Pt reports that during his headaches he sees some "floaters." No unilateral symptoms. Pt reports photophobia but denies phonophobia. He also reports rare episodes of nausea and vomiting during headaches. Headaches typically last for 1-2 days.    Limitations  Walking    Currently in Pain?  --   Not related to current episode        SUBJECTIVE  Chief complaint: History of simultaneous bilateral knee replacement in 2017. He cut his L calf with a weedeater 5-6 months ago and pt reports that it introduced staph bacteria into his LLE which spread to his L knee. His L knee was opened, cleaned, and packed with antibacterial beads. He had one visit with [facility] PT to review exercises but he declined any further therapy. He was referred today by Dr. Manuella Ghazi due to "dizziness and falling." Per neurology note patient reports that his symptoms started November 2018. Patient has burning, tingling, pins and needles, aching in his bilateral hands and feet, worse in the feet. He states that he can't stand the feeling of socks on his feet. He denies any feelings of LE weakness. Patient symptoms came on gradually and got worse. Patient states that the gabapentin is not helping with his foot numbness. He was taking 1200mg  TID but it wasn't helping. He has elevated HgA1c but denies history of diabetes. Patient has history of three episodes of acute renal failure due to dehydration. He also reports that he sweats a lot. Patient has no known vitamin B12 deficiency, no known autoimmune disease. Patient does not drinks alcohol at all. Patient has regular family stress. Patient currently on oral antibiotics due to L knee infection. Patient has family history of neuropathy and diabetes. He has some back pain but this has improved recently.  He reports some dizziness and describes it as feeling "drunk." He has a hard time describing exactly how he feels but reports that he feels like his vision is getting dark from the periphery and closing in on him. He has "awful headaches" but states that he has never been diagnosed with migraines. Pt reports that during his headaches he sees some "floaters." No unilateral symptoms. Pt reports photophobia but denies phonophobia. He also reports rare episodes of nausea and vomiting during headaches. Headaches typically last for 1-2 days.   Onset: 03/2017  Imaging: No imaging of brain on file  Recent changes in overall health/medication: Recent L knee replacement infection on IV antiobiotics recently PICC pulled and switched to oral meds  Falls in the last 6 months: 4  Directional pattern for falls: None  Prior history of physical  therapy for balance: None  Follow-up appointment with MD: 04/2018  Red flags (bowel/bladder changes, saddle paresthesia, personal history of cancer, chills/fever, night sweats, unrelenting pain) Negative     [facility] PT Assessment - 02/15/18 0925      Assessment   Medical Diagnosis  Polyneuropathy    Referring Provider (PT)  Dr. Manuella Ghazi    Onset Date/Surgical Date  03/02/17    Hand Dominance  Right    Next MD Visit  December 2019    Prior Therapy  None for balance      Precautions   Precautions  Fall      Restrictions   Weight Bearing Restrictions  No      Balance Screen   Has the patient fallen in the past 6 months  Yes    How many times?  4    Has the patient had a decrease in activity level because of a fear of falling?   No    Is the patient reluctant to leave their home because of a fear of falling?   No      Home Film/video editor residence      Prior Function   Level of Independence  Independent      Cognition   Overall Cognitive Status  Within Functional Limits for tasks assessed      Observation/Other Assessments   Other  Surveys   Other Surveys    Activities of Balance Confidence Scale (ABC Scale)   71.88%      Standardized Balance Assessment   Standardized Balance Assessment  Timed Up and Go Test;Five Times Sit to Stand;10 meter walk test;Berg Balance Test    Five times sit to stand comments   16.3 seconds    10 Meter Walk  Self-selected: 10.1s = 0.99 m/s; Fastest: 8.6s = 1.16 m/s without assistive device      Berg Balance Test   Sit to Stand  Able to stand without using hands and stabilize independently    Standing Unsupported  Able to stand safely 2 minutes    Sitting with Back Unsupported but Feet Supported on Floor or Stool  Able to sit safely and securely 2 minutes    Stand to Sit  Sits safely with minimal use of hands    Transfers  Able to transfer safely, minor use of hands    Standing Unsupported with Eyes Closed  Able to stand 10 seconds safely    Standing Ubsupported with Feet Together  Able to place feet together independently and stand 1 minute safely    From Standing, Reach Forward with Outstretched Arm  Can reach confidently >25 cm (10")    From Standing Position, Pick up Object from Floor  Able to pick up shoe safely and easily    From Standing Position, Turn to Look Behind Over each Shoulder  Looks behind from both sides and weight shifts well    Turn 360 Degrees  Able to turn 360 degrees safely in 4 seconds or less    Standing Unsupported, Alternately Place Feet on Step/Stool  Able to stand independently and safely and complete 8 steps in 20 seconds    Standing Unsupported, One Foot in Front  Able to plae foot ahead of the other independently and hold 30 seconds    Standing on One Leg  Able to lift leg independently and hold 5-10 seconds    Total Score  54      Timed Up and Go Test  TUG  Normal TUG    Normal TUG (seconds)  9.9      Functional Gait  Assessment   Gait assessed   Yes    Gait Level Surface  Walks 20 ft in less than 5.5 sec, no assistive devices, good speed, no evidence  for imbalance, normal gait pattern, deviates no more than 6 in outside of the 12 in walkway width.    Change in Gait Speed  Able to smoothly change walking speed without loss of balance or gait deviation. Deviate no more than 6 in outside of the 12 in walkway width.    Gait with Horizontal Head Turns  Performs head turns smoothly with slight change in gait velocity (eg, minor disruption to smooth gait path), deviates 6-10 in outside 12 in walkway width, or uses an assistive device.    Gait with Vertical Head Turns  Performs task with slight change in gait velocity (eg, minor disruption to smooth gait path), deviates 6 - 10 in outside 12 in walkway width or uses assistive device    Gait and Pivot Turn  Pivot turns safely within 3 sec and stops quickly with no loss of balance.    Step Over Obstacle  Is able to step over 2 stacked shoe boxes taped together (9 in total height) without changing gait speed. No evidence of imbalance.    Gait with Narrow Base of Support  Ambulates 4-7 steps.    Gait with Eyes Closed  Walks 20 ft, uses assistive device, slower speed, mild gait deviations, deviates 6-10 in outside 12 in walkway width. Ambulates 20 ft in less than 9 sec but greater than 7 sec.    Ambulating Backwards  Walks 20 ft, no assistive devices, good speed, no evidence for imbalance, normal gait    Steps  Alternating feet, must use rail.    Total Score  24         OBJECTIVE   MUSCULOSKELETAL: Tremor: Absent Bulk: Normal Tone: Normal, no clonus  Posture  No gross abnormalities noted in standing or seated posture   Gait  Ambulates with spc in RUE with mild antalgic gait on LLE.  Strength  R/L  5/5 Hip flexion  5/5 Hip external rotation  5/5 Hip internal rotation  4+/4+ Hip extension  4+/4+ Hip abduction  4+/4+ Hip adduction 5/5 Knee extension 5/5 Knee flexion  5/5 Ankle Dorsiflexion    NEUROLOGICAL: Mental Status Patient is oriented to person, place and time.  Recent memory is  intact.  Remote memory is intact.  Attention span and concentration are intact.  Expressive speech is intact.  Patient's fund of knowledge is within normal limits for educational level.  Cranial Nerves Visual acuity and visual fields are intact  Extraocular muscles are intact  Facial sensation is intact bilaterally  Facial strength is intact bilaterally  Hearing is normal as tested by gross conversation Palate elevates midline, normal phonation  Shoulder shrug strength is intact  Tongue protrudes midline   Sensation Grossly intact to light touch bilateral UEs/LEs as determined by testing dermatomes C2-T2/L2-S2 respectively with the exception of reported numbness from the ankles down bilaterally. Sensation testing of feet not performed at this time.  Proprioception and hot/cold testing deferred on this date   Reflexes: Deferred at this time  Coordination/Cerebellar Finger to Nose: WNL  Heel to Shin: WNL  Rapid alternating movements: WNL  Finger Opposition: WNL  Pronator Drift: Negative    FUNCTIONAL OUTCOME MEASURES  Results Comments  BERG 54/56 Fall risk,  in need of intervention  DGI    FGA 24/30 Less than or equal to 22 indicative of increased risk for falls  TUG 9.9 seconds WFL  5TSTS 16.3 seconds Below norms  10 Meter Gait Speed Self-selected: 10.1s = 0.99 m/s; Fastest: 8.6s = 1.16 m/s without assistive device WFL  ABC Scale 71.88%   DHI    6 Minute Walk Test      POSTURAL CONTROL TESTS  Clinical Test of Sensory Interaction for Balance (CTSIB): CONDITION TIME STRATEGY SWAY  Eyes open, firm surface 30 seconds ankle 1+  Eyes closed, firm surface 30 seconds ankle 2+  Eyes open, foam surface 30 seconds ankle 2+  Eyes closed, foam surface 30 seconds ankle 3+    OCULOMOTOR / VESTIBULAR TESTING:  Oculomotor Exam- Room Light   Findings Comments  Ocular Alignment normal   Ocular ROM normal   Spontaneous Nystagmus3  normal   End-Gaze Nystagmus normal   Smooth  Pursuit normal   Saccades normal   VOR normal   VOR Cancellation normal   Left Head Thrust normal   Right Head Thrust normal   Head Shaking Nystagmus not examined   Static Acuity not examined   Dynamic Acuity not examined     BPPV TESTS: Deferred on this date due to symptoms of vertigo             Objective measurements completed on examination: See above findings.              PT Education - 02/15/18 0925    Education Details  Outcome measures, plan of care, HEP    Person(s) Educated  Patient    Methods  Explanation;Handout    Comprehension  Verbalized understanding          PT Long Term Goals - 02/15/18 1323      PT LONG TERM GOAL #1   Title  Pt will be independent with HEP in order to improve strength and balance in order to decrease fall risk and improve function at home and work.    Time  1    Period  Weeks    Status  Achieved    Target Date  02/22/18             Plan - 02/15/18 0926    Clinical Impression Statement  Pt is a pleasant 52 year-old male referred for difficulty with balance. Pt also complaining of intermittent dizziness. PT examination reveals normal gait speed and TUG Oculomotor testing grossly normal. TUG is slightly above cut-off values for age/gender. FGA is 24/30 and BERG is 54/56 both of which indicate higher level balance deficits but very minimal impairment. Pt reports chronic regular severe headaches which last for multiple days and are associated with photophobia. He has never been formally diagnosed with migraines. Pt states that he prefers not to continue with therapy at this time. In reality his deficits are minimal at this time. Pt was provided with a home exercise program to work on balance and LE strength. He was advised to obtain a new order if his balance worsens.    History and Personal Factors relevant to plan of care:  Low (stable): no personal factors/comorbidities, 1-2 body systems/activity  limitations/participation restrictions      Clinical Presentation  Unstable    Clinical Decision Making  Low    Rehab Potential  Good    PT Frequency  One time visit    PT Duration  --   1 week   PT  Treatment/Interventions  ADLs/Self Care Home Management;Aquatic Therapy;Canalith Repostioning;Cryotherapy;Electrical Stimulation;Iontophoresis 4mg /ml Dexamethasone;Moist Heat;Traction;Ultrasound;DME Instruction;Gait training;Stair training;Functional mobility training;Therapeutic activities;Therapeutic exercise;Balance training;Neuromuscular re-education;Cognitive remediation;Patient/family education;Manual techniques;Passive range of motion;Dry needling;Energy conservation;Vestibular    PT Next Visit Plan  Discharge    PT Home Exercise Plan  Sit to stand without UE support, tandem balance with eyes closed, tandem walking, single leg balance    Consulted and Agree with Plan of Care  Patient       Patient will benefit from skilled therapeutic intervention in order to improve the following deficits and impairments:  Abnormal gait, Decreased strength, Decreased balance  Visit Diagnosis: Unsteadiness on feet  Muscle weakness (generalized)     Problem List Patient Active Problem List   Diagnosis Date Noted  . Arthritis, septic, knee ([place]) 12/08/2017  . Hyperglycemia, unspecified 02/16/2016  . Anxiety 01/26/2015  . Hyperlipidemia 01/26/2015  . Hypertension 01/26/2015   Phillips Grout PT, DPT, GCS  Manasa Spease 02/15/2018, 1:24 PM  [place] MAIN [facility] SERVICES [address] [place], [place], [address] Phone: [phone]   Fax:  [phone]  Name: CHAMBERLAIN STEINBORN MRN: [id number] Date of Birth: [birth year]

## 2018-02-19 ENCOUNTER — Ambulatory Visit: Payer: 59 | Admitting: Physical Therapy

## 2018-02-20 ENCOUNTER — Ambulatory Visit: Payer: 59

## 2018-02-21 ENCOUNTER — Ambulatory Visit: Payer: 59

## 2018-02-22 ENCOUNTER — Ambulatory Visit: Payer: 59

## 2018-02-26 ENCOUNTER — Ambulatory Visit: Payer: 59

## 2018-02-26 ENCOUNTER — Other Ambulatory Visit: Payer: Self-pay | Admitting: Licensed Clinical Social Worker

## 2018-02-26 DIAGNOSIS — M0000 Staphylococcal arthritis, unspecified joint: Secondary | ICD-10-CM

## 2018-02-27 ENCOUNTER — Ambulatory Visit: Payer: 59

## 2018-03-01 ENCOUNTER — Ambulatory Visit: Payer: 59

## 2018-03-05 ENCOUNTER — Ambulatory Visit: Payer: 59

## 2018-03-06 ENCOUNTER — Ambulatory Visit: Payer: 59

## 2018-03-07 ENCOUNTER — Ambulatory Visit: Payer: 59

## 2018-03-08 ENCOUNTER — Ambulatory Visit: Payer: 59

## 2018-03-12 ENCOUNTER — Ambulatory Visit: Payer: 59

## 2018-03-13 ENCOUNTER — Ambulatory Visit: Payer: 59

## 2018-03-13 ENCOUNTER — Other Ambulatory Visit
Admission: RE | Admit: 2018-03-13 | Discharge: 2018-03-13 | Disposition: A | Discharge status: Home / Self Care | Payer: 59 | Source: Ambulatory Visit | Attending: Infectious Diseases | Admitting: Infectious Diseases

## 2018-03-13 DIAGNOSIS — M0000 Staphylococcal arthritis, unspecified joint: Secondary | ICD-10-CM | POA: Diagnosis present

## 2018-03-13 LAB — CBC WITH DIFFERENTIAL/PLATELET
ABS IMMATURE GRANULOCYTES: 0.02 10*3/uL (ref 0.00–0.07)
BASOS PCT: 1 %
Basophils Absolute: 0.1 10*3/uL (ref 0.0–0.1)
Eosinophils Absolute: 0.8 10*3/uL — ABNORMAL HIGH (ref 0.0–0.5)
Eosinophils Relative: 11 %
HCT: 46.5 % (ref 39.0–52.0)
Hemoglobin: 15.4 g/dL (ref 13.0–17.0)
IMMATURE GRANULOCYTES: 0 %
Lymphocytes Relative: 23 %
Lymphs Abs: 1.6 10*3/uL (ref 0.7–4.0)
MCH: 29.4 pg (ref 26.0–34.0)
MCHC: 33.1 g/dL (ref 30.0–36.0)
MCV: 88.9 fL (ref 80.0–100.0)
Monocytes Absolute: 0.5 10*3/uL (ref 0.1–1.0)
Monocytes Relative: 8 %
NEUTROS ABS: 4.1 10*3/uL (ref 1.7–7.7)
NEUTROS PCT: 57 %
NRBC: 0 % (ref 0.0–0.2)
PLATELETS: 298 10*3/uL (ref 150–400)
RBC: 5.23 MIL/uL (ref 4.22–5.81)
RDW: 12.9 % (ref 11.5–15.5)
WBC: 7.1 10*3/uL (ref 4.0–10.5)

## 2018-03-13 LAB — COMPREHENSIVE METABOLIC PANEL
ALT: 46 U/L — AB (ref 0–44)
AST: 53 U/L — ABNORMAL HIGH (ref 15–41)
Albumin: 4.6 g/dL (ref 3.5–5.0)
Alkaline Phosphatase: 80 U/L (ref 38–126)
Anion gap: 13 (ref 5–15)
BUN: 10 mg/dL (ref 6–20)
CHLORIDE: 101 mmol/L (ref 98–111)
CO2: 24 mmol/L (ref 22–32)
CREATININE: 0.79 mg/dL (ref 0.61–1.24)
Calcium: 9.5 mg/dL (ref 8.9–10.3)
GFR calc non Af Amer: >60 mL/min (ref >60)
Glucose, Bld: 129 mg/dL — ABNORMAL HIGH (ref 70–99)
Potassium: 4 mmol/L (ref 3.5–5.1)
SODIUM: 138 mmol/L (ref 135–145)
Total Bilirubin: 0.5 mg/dL (ref 0.3–1.2)
Total Protein: 8.8 g/dL — ABNORMAL HIGH (ref 6.5–8.1)

## 2018-03-13 LAB — SEDIMENTATION RATE: SED RATE: 2 mm/h (ref 0–20)

## 2018-03-13 LAB — C-REACTIVE PROTEIN: CRP: 0.9 mg/dL (ref <1.0)

## 2018-03-14 ENCOUNTER — Ambulatory Visit: Payer: 59

## 2018-03-15 ENCOUNTER — Ambulatory Visit: Payer: 59

## 2018-03-19 ENCOUNTER — Ambulatory Visit: Payer: 59

## 2018-03-20 ENCOUNTER — Ambulatory Visit: Payer: 59

## 2018-03-21 ENCOUNTER — Ambulatory Visit: Payer: 59

## 2018-03-22 ENCOUNTER — Ambulatory Visit: Payer: 59

## 2018-03-26 ENCOUNTER — Ambulatory Visit: Payer: 59

## 2018-03-27 ENCOUNTER — Ambulatory Visit: Payer: 59

## 2018-03-28 ENCOUNTER — Ambulatory Visit: Payer: 59

## 2018-04-02 ENCOUNTER — Ambulatory Visit: Payer: 59

## 2018-04-02 ENCOUNTER — Other Ambulatory Visit: Payer: Self-pay | Admitting: Licensed Clinical Social Worker

## 2018-04-02 MED ORDER — RISAQUAD PO CAPS: 1.0000 | ORAL_CAPSULE | Freq: Every day | ORAL | 1 refills | Status: DC

## 2018-04-02 MED ORDER — SULFAMETHOXAZOLE-TRIMETHOPRIM 800-160 MG PO TABS: 1.0000 | ORAL_TABLET | Freq: Two times a day (BID) | ORAL | 1 refills | Status: DC

## 2018-04-02 MED ORDER — RIFAMPIN 300 MG PO CAPS: 300.0000 mg | ORAL_CAPSULE | Freq: Two times a day (BID) | ORAL | 1 refills | Status: DC

## 2018-04-04 ENCOUNTER — Ambulatory Visit: Payer: 59

## 2018-04-09 ENCOUNTER — Ambulatory Visit: Payer: 59

## 2018-04-11 ENCOUNTER — Ambulatory Visit: Payer: 59

## 2018-04-16 DIAGNOSIS — E785 Hyperlipidemia, unspecified: Secondary | ICD-10-CM | POA: Diagnosis not present

## 2018-04-16 DIAGNOSIS — M170 Bilateral primary osteoarthritis of knee: Secondary | ICD-10-CM | POA: Diagnosis not present

## 2018-05-01 ENCOUNTER — Other Ambulatory Visit: Payer: Self-pay | Admitting: Licensed Clinical Social Worker

## 2018-05-01 DIAGNOSIS — G5603 Carpal tunnel syndrome, bilateral upper limbs: Secondary | ICD-10-CM | POA: Diagnosis not present

## 2018-05-01 DIAGNOSIS — G608 Other hereditary and idiopathic neuropathies: Secondary | ICD-10-CM | POA: Diagnosis not present

## 2018-05-01 DIAGNOSIS — M5442 Lumbago with sciatica, left side: Secondary | ICD-10-CM | POA: Diagnosis not present

## 2018-05-01 MED ORDER — RIFAMPIN 300 MG PO CAPS: 300.0000 mg | ORAL_CAPSULE | Freq: Two times a day (BID) | ORAL | 1 refills | Status: DC

## 2018-05-01 MED ORDER — RISAQUAD PO CAPS: 1.0000 | ORAL_CAPSULE | Freq: Every day | ORAL | 1 refills | Status: DC

## 2018-05-01 MED ORDER — SULFAMETHOXAZOLE-TRIMETHOPRIM 800-160 MG PO TABS: 1.0000 | ORAL_TABLET | Freq: Two times a day (BID) | ORAL | 1 refills | Status: DC

## 2018-05-08 ENCOUNTER — Other Ambulatory Visit: Payer: Self-pay | Admitting: Neurology

## 2018-05-08 DIAGNOSIS — R739 Hyperglycemia, unspecified: Secondary | ICD-10-CM | POA: Diagnosis not present

## 2018-05-08 DIAGNOSIS — M5442 Lumbago with sciatica, left side: Principal | ICD-10-CM

## 2018-05-08 DIAGNOSIS — M2550 Pain in unspecified joint: Secondary | ICD-10-CM | POA: Diagnosis not present

## 2018-05-08 DIAGNOSIS — G8929 Other chronic pain: Secondary | ICD-10-CM | POA: Diagnosis not present

## 2018-05-08 DIAGNOSIS — M25569 Pain in unspecified knee: Secondary | ICD-10-CM | POA: Diagnosis not present

## 2018-05-08 DIAGNOSIS — I10 Essential (primary) hypertension: Secondary | ICD-10-CM | POA: Diagnosis not present

## 2018-05-08 DIAGNOSIS — M795 Residual foreign body in soft tissue: Secondary | ICD-10-CM

## 2018-05-09 ENCOUNTER — Other Ambulatory Visit
Admission: RE | Admit: 2018-05-09 | Discharge: 2018-05-09 | Disposition: A | Discharge status: Home / Self Care | Payer: 59 | Source: Ambulatory Visit | Attending: Pediatrics | Admitting: Pediatrics

## 2018-05-09 ENCOUNTER — Other Ambulatory Visit: Payer: Self-pay | Admitting: Licensed Clinical Social Worker

## 2018-05-09 DIAGNOSIS — M0000 Staphylococcal arthritis, unspecified joint: Secondary | ICD-10-CM

## 2018-05-09 LAB — CBC WITH DIFFERENTIAL/PLATELET
Abs Immature Granulocytes: 0.02 10*3/uL (ref 0.00–0.07)
BASOS ABS: 0.1 10*3/uL (ref 0.0–0.1)
Basophils Relative: 1 %
EOS ABS: 0.5 10*3/uL (ref 0.0–0.5)
Eosinophils Relative: 8 %
HEMATOCRIT: 44 % (ref 39.0–52.0)
Hemoglobin: 14.6 g/dL (ref 13.0–17.0)
IMMATURE GRANULOCYTES: 0 %
Lymphocytes Relative: 24 %
Lymphs Abs: 1.5 10*3/uL (ref 0.7–4.0)
MCH: 29.6 pg (ref 26.0–34.0)
MCHC: 33.2 g/dL (ref 30.0–36.0)
MCV: 89.1 fL (ref 80.0–100.0)
Monocytes Absolute: 0.5 10*3/uL (ref 0.1–1.0)
Monocytes Relative: 8 %
NEUTROS PCT: 59 %
NRBC: 0 % (ref 0.0–0.2)
Neutro Abs: 3.5 10*3/uL (ref 1.7–7.7)
PLATELETS: 236 10*3/uL (ref 150–400)
RBC: 4.94 MIL/uL (ref 4.22–5.81)
RDW: 13.4 % (ref 11.5–15.5)
WBC: 6 10*3/uL (ref 4.0–10.5)

## 2018-05-09 LAB — COMPREHENSIVE METABOLIC PANEL
ALBUMIN: 4.2 g/dL (ref 3.5–5.0)
ALT: 29 U/L (ref 0–44)
AST: 30 U/L (ref 15–41)
Alkaline Phosphatase: 62 U/L (ref 38–126)
Anion gap: 9 (ref 5–15)
BILIRUBIN TOTAL: 0.4 mg/dL (ref 0.3–1.2)
BUN: 11 mg/dL (ref 6–20)
CHLORIDE: 104 mmol/L (ref 98–111)
CO2: 24 mmol/L (ref 22–32)
Calcium: 9.3 mg/dL (ref 8.9–10.3)
Creatinine, Ser: 0.75 mg/dL (ref 0.61–1.24)
GFR calc Af Amer: >60 mL/min (ref >60)
GFR calc non Af Amer: >60 mL/min (ref >60)
GLUCOSE: 121 mg/dL — AB (ref 70–99)
Potassium: 3.9 mmol/L (ref 3.5–5.1)
SODIUM: 137 mmol/L (ref 135–145)
Total Protein: 7.7 g/dL (ref 6.5–8.1)

## 2018-05-09 LAB — SEDIMENTATION RATE: SED RATE: 8 mm/h (ref 0–20)

## 2018-05-09 LAB — C-REACTIVE PROTEIN: CRP: 1 mg/dL — AB (ref <1.0)

## 2018-05-18 ENCOUNTER — Ambulatory Visit
Admission: RE | Admit: 2018-05-18 | Discharge: 2018-05-18 | Disposition: A | Discharge status: Home / Self Care | Payer: 59 | Source: Ambulatory Visit | Attending: Neurology | Admitting: Neurology

## 2018-05-18 DIAGNOSIS — M545 Low back pain: Secondary | ICD-10-CM | POA: Diagnosis not present

## 2018-05-18 DIAGNOSIS — M5442 Lumbago with sciatica, left side: Secondary | ICD-10-CM | POA: Diagnosis not present

## 2018-05-18 DIAGNOSIS — G8929 Other chronic pain: Secondary | ICD-10-CM

## 2018-05-21 DIAGNOSIS — M170 Bilateral primary osteoarthritis of knee: Secondary | ICD-10-CM | POA: Diagnosis not present

## 2018-05-21 DIAGNOSIS — I10 Essential (primary) hypertension: Secondary | ICD-10-CM | POA: Diagnosis not present

## 2018-06-03 IMAGING — DX DG KNEE 1-2V*L*
2 series · 2 of 2 positions shown · non-contrast
Comparison: Left knee CT March 03, 2016

CLINICAL DATA: Status post total knee replacement

EXAM:
LEFT KNEE - 1-2 VIEW

[knee ap]
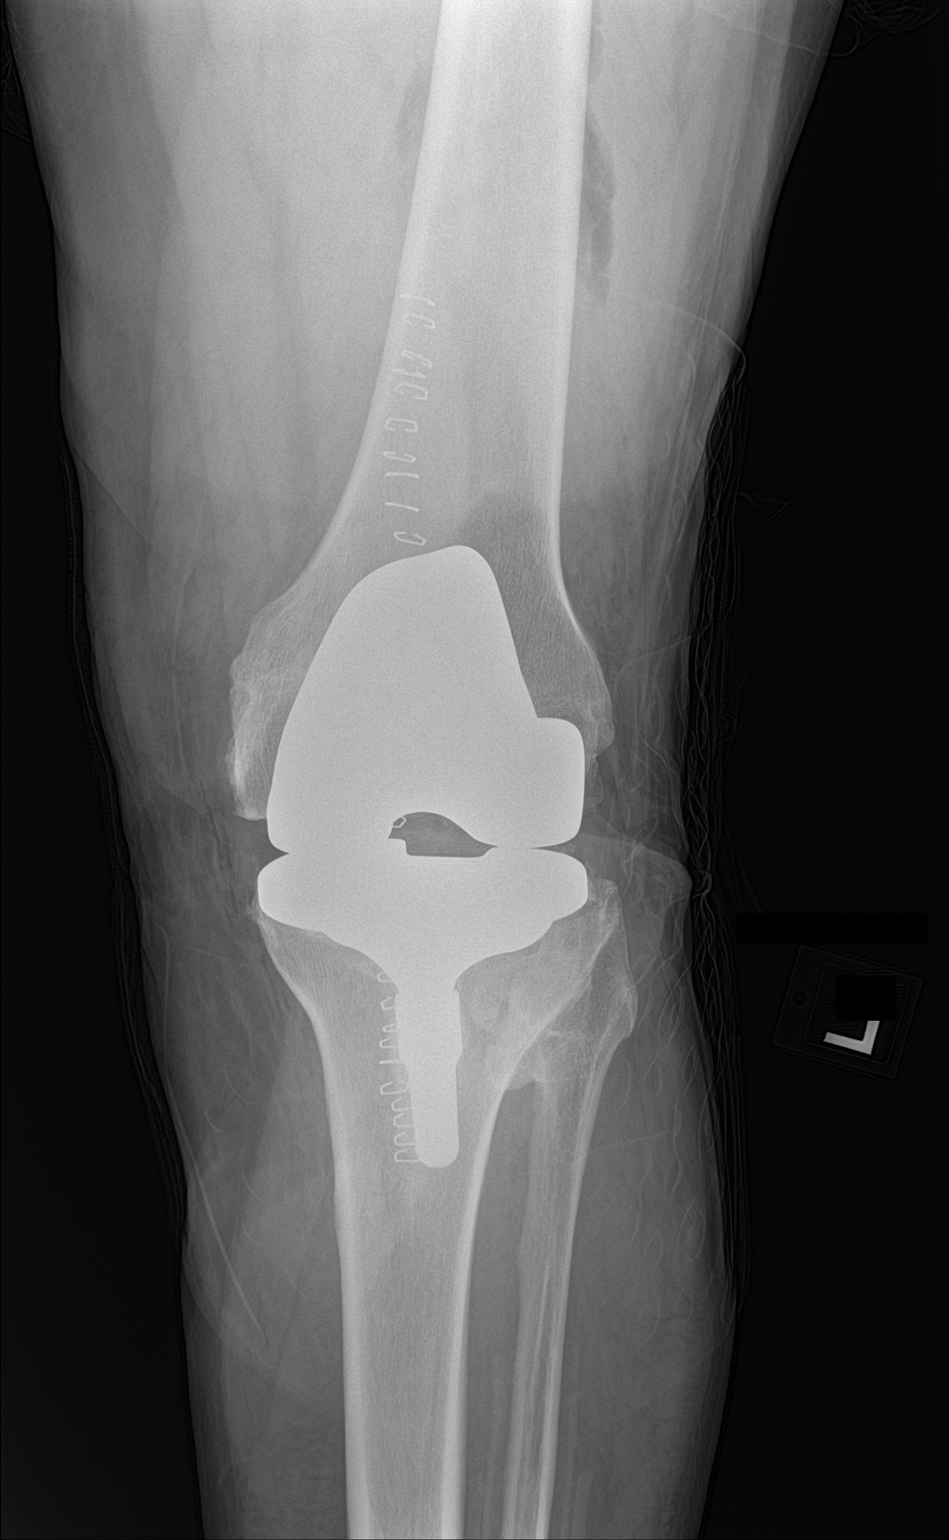

[knee lat]
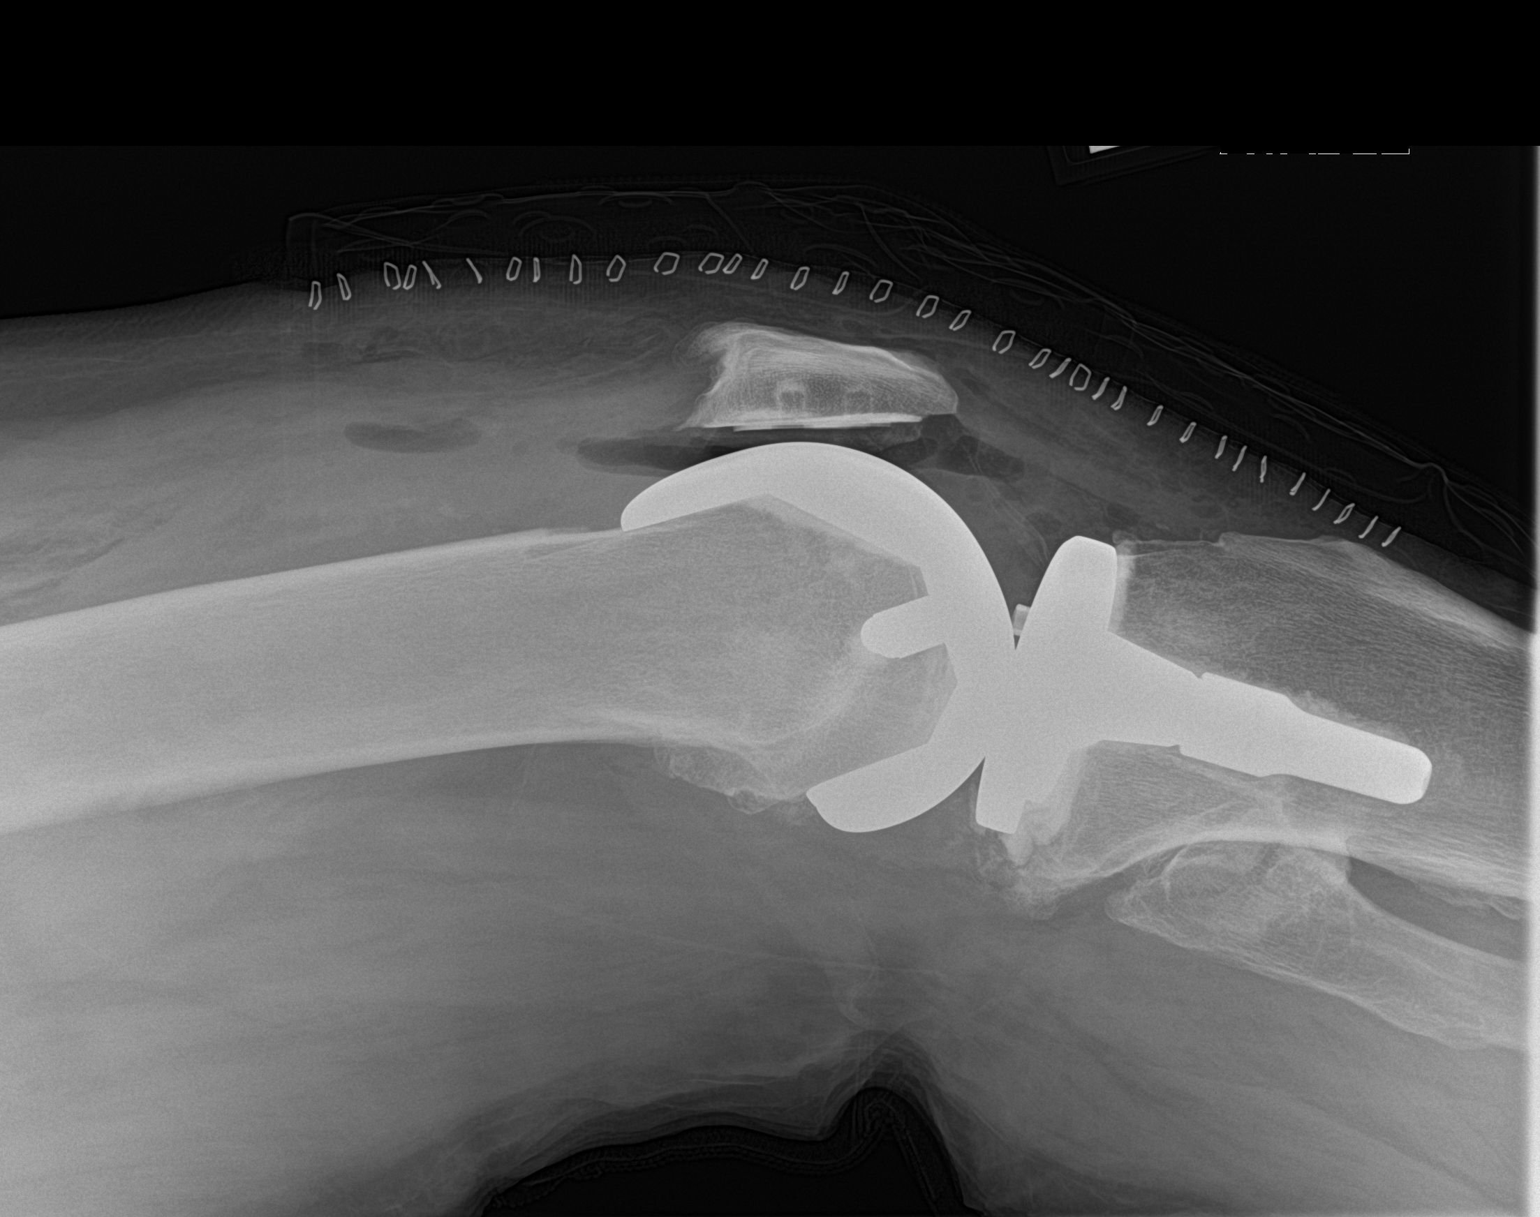

[2 of 2 positions shown; findings below may reference images not displayed]

FINDINGS: Frontal and lateral views were obtained it. The patient is status
post total knee arthroplasty with femoral and tibial prosthetic
components appearing well seated. No acute fracture or dislocation
evident. Air within the joint is an expected postoperative finding.
There is spurring along the patella.
IMPRESSION: Prosthetic components appear well seated. No acute fracture or
dislocation. Arthropathy with spurring in the patellar region.

## 2018-06-05 ENCOUNTER — Other Ambulatory Visit: Payer: Self-pay | Admitting: Infectious Diseases

## 2018-06-05 ENCOUNTER — Telehealth: Payer: Self-pay | Admitting: Infectious Diseases

## 2018-06-05 MED ORDER — SULFAMETHOXAZOLE-TRIMETHOPRIM 800-160 MG PO TABS: 1.0000 | ORAL_TABLET | Freq: Two times a day (BID) | ORAL | 0 refills | Status: DC

## 2018-06-05 MED ORDER — RIFAMPIN 300 MG PO CAPS: 300.0000 mg | ORAL_CAPSULE | Freq: Two times a day (BID) | ORAL | 0 refills | Status: DC

## 2018-06-05 NOTE — Telephone Encounter (Signed)
Pt. States he is out of antibiotics. He is wanting to know if he needs to continue to take or not. If so, he will need refill called into [facility] in [place].  He is requesting a call back whenever someone gets this message.  Thanks

## 2018-06-05 NOTE — Telephone Encounter (Signed)
I am sending one month supply. Tamika can you call him and let him know. Thx-

## 2018-06-06 ENCOUNTER — Other Ambulatory Visit: Payer: Self-pay | Admitting: Infectious Diseases

## 2018-06-06 DIAGNOSIS — M00062 Staphylococcal arthritis, left knee: Secondary | ICD-10-CM

## 2018-06-06 NOTE — Progress Notes (Signed)
Pt on 6 month month of oral Rifampin + Bactrim- ill do labs- this may be his last month- being treated for prosthetic left knee joint infection with MSSA

## 2018-06-06 NOTE — Telephone Encounter (Signed)
Spoke to him . He will be doing labs next week . Sent 1 month antibiotic only

## 2018-06-10 ENCOUNTER — Emergency Department: Payer: 59

## 2018-06-10 ENCOUNTER — Inpatient Hospital Stay
Admission: EM | Admit: 2018-06-10 | Discharge: 2018-06-15 | DRG: 486 | Disposition: A | Discharge status: Home / Self Care | Payer: 59 | Attending: Orthopedic Surgery | Admitting: Orthopedic Surgery

## 2018-06-10 ENCOUNTER — Encounter: Payer: Self-pay | Admitting: Emergency Medicine

## 2018-06-10 ENCOUNTER — Other Ambulatory Visit: Payer: Self-pay

## 2018-06-10 DIAGNOSIS — Z6841 Body Mass Index (BMI) 40.0 and over, adult: Secondary | ICD-10-CM | POA: Diagnosis not present

## 2018-06-10 DIAGNOSIS — F419 Anxiety disorder, unspecified: Secondary | ICD-10-CM | POA: Diagnosis not present

## 2018-06-10 DIAGNOSIS — Z79899 Other long term (current) drug therapy: Secondary | ICD-10-CM

## 2018-06-10 DIAGNOSIS — T8454XA Infection and inflammatory reaction due to internal left knee prosthesis, initial encounter: Principal | ICD-10-CM | POA: Diagnosis present

## 2018-06-10 DIAGNOSIS — F319 Bipolar disorder, unspecified: Secondary | ICD-10-CM | POA: Diagnosis present

## 2018-06-10 DIAGNOSIS — Z7982 Long term (current) use of aspirin: Secondary | ICD-10-CM

## 2018-06-10 DIAGNOSIS — E785 Hyperlipidemia, unspecified: Secondary | ICD-10-CM | POA: Diagnosis present

## 2018-06-10 DIAGNOSIS — I10 Essential (primary) hypertension: Secondary | ICD-10-CM | POA: Diagnosis not present

## 2018-06-10 DIAGNOSIS — Z888 Allergy status to other drugs, medicaments and biological substances status: Secondary | ICD-10-CM

## 2018-06-10 DIAGNOSIS — M25562 Pain in left knee: Secondary | ICD-10-CM | POA: Diagnosis not present

## 2018-06-10 DIAGNOSIS — K760 Fatty (change of) liver, not elsewhere classified: Secondary | ICD-10-CM | POA: Diagnosis not present

## 2018-06-10 DIAGNOSIS — Z96653 Presence of artificial knee joint, bilateral: Secondary | ICD-10-CM | POA: Diagnosis present

## 2018-06-10 DIAGNOSIS — Z792 Long term (current) use of antibiotics: Secondary | ICD-10-CM | POA: Diagnosis not present

## 2018-06-10 DIAGNOSIS — Z8249 Family history of ischemic heart disease and other diseases of the circulatory system: Secondary | ICD-10-CM | POA: Diagnosis not present

## 2018-06-10 DIAGNOSIS — Y831 Surgical operation with implant of artificial internal device as the cause of abnormal reaction of the patient, or of later complication, without mention of misadventure at the time of the procedure: Secondary | ICD-10-CM | POA: Diagnosis present

## 2018-06-10 DIAGNOSIS — F339 Major depressive disorder, recurrent, unspecified: Secondary | ICD-10-CM | POA: Diagnosis not present

## 2018-06-10 DIAGNOSIS — G4730 Sleep apnea, unspecified: Secondary | ICD-10-CM | POA: Diagnosis present

## 2018-06-10 DIAGNOSIS — Z87891 Personal history of nicotine dependence: Secondary | ICD-10-CM | POA: Diagnosis not present

## 2018-06-10 DIAGNOSIS — Z978 Presence of other specified devices: Secondary | ICD-10-CM | POA: Diagnosis not present

## 2018-06-10 DIAGNOSIS — M7989 Other specified soft tissue disorders: Secondary | ICD-10-CM | POA: Diagnosis not present

## 2018-06-10 DIAGNOSIS — M00062 Staphylococcal arthritis, left knee: Secondary | ICD-10-CM | POA: Diagnosis not present

## 2018-06-10 DIAGNOSIS — S8992XA Unspecified injury of left lower leg, initial encounter: Secondary | ICD-10-CM | POA: Diagnosis not present

## 2018-06-10 DIAGNOSIS — Z96652 Presence of left artificial knee joint: Secondary | ICD-10-CM | POA: Diagnosis not present

## 2018-06-10 DIAGNOSIS — B9561 Methicillin susceptible Staphylococcus aureus infection as the cause of diseases classified elsewhere: Secondary | ICD-10-CM | POA: Diagnosis present

## 2018-06-10 DIAGNOSIS — M79662 Pain in left lower leg: Secondary | ICD-10-CM | POA: Diagnosis not present

## 2018-06-10 LAB — CBC WITH DIFFERENTIAL/PLATELET
Abs Immature Granulocytes: 0.03 10*3/uL (ref 0.00–0.07)
BASOS ABS: 0 10*3/uL (ref 0.0–0.1)
Basophils Relative: 0 %
EOS PCT: 0 %
Eosinophils Absolute: 0.1 10*3/uL (ref 0.0–0.5)
HCT: 46.7 % (ref 39.0–52.0)
HEMOGLOBIN: 15.7 g/dL (ref 13.0–17.0)
IMMATURE GRANULOCYTES: 0 %
LYMPHS PCT: 11 %
Lymphs Abs: 1.3 10*3/uL (ref 0.7–4.0)
MCH: 30.3 pg (ref 26.0–34.0)
MCHC: 33.6 g/dL (ref 30.0–36.0)
MCV: 90.2 fL (ref 80.0–100.0)
Monocytes Absolute: 1 10*3/uL (ref 0.1–1.0)
Monocytes Relative: 9 %
NEUTROS PCT: 80 %
NRBC: 0 % (ref 0.0–0.2)
Neutro Abs: 8.7 10*3/uL — ABNORMAL HIGH (ref 1.7–7.7)
Platelets: 285 10*3/uL (ref 150–400)
RBC: 5.18 MIL/uL (ref 4.22–5.81)
RDW: 12.8 % (ref 11.5–15.5)
WBC: 11.1 10*3/uL — AB (ref 4.0–10.5)

## 2018-06-10 LAB — COMPREHENSIVE METABOLIC PANEL
ALBUMIN: 4.8 g/dL (ref 3.5–5.0)
ALK PHOS: 68 U/L (ref 38–126)
ALT: 28 U/L (ref 0–44)
ANION GAP: 12 (ref 5–15)
AST: 25 U/L (ref 15–41)
BUN: 14 mg/dL (ref 6–20)
CHLORIDE: 103 mmol/L (ref 98–111)
CO2: 20 mmol/L — ABNORMAL LOW (ref 22–32)
Calcium: 9.5 mg/dL (ref 8.9–10.3)
Creatinine, Ser: 0.88 mg/dL (ref 0.61–1.24)
GFR calc Af Amer: >60 mL/min (ref >60)
GFR calc non Af Amer: >60 mL/min (ref >60)
Glucose, Bld: 110 mg/dL — ABNORMAL HIGH (ref 70–99)
POTASSIUM: 3.9 mmol/L (ref 3.5–5.1)
SODIUM: 135 mmol/L (ref 135–145)
Total Bilirubin: 1.5 mg/dL — ABNORMAL HIGH (ref 0.3–1.2)
Total Protein: 8.7 g/dL — ABNORMAL HIGH (ref 6.5–8.1)

## 2018-06-10 LAB — SEDIMENTATION RATE: SED RATE: 39 mm/h — AB (ref 0–20)

## 2018-06-10 MED ORDER — HYDROMORPHONE HCL 1 MG/ML IJ SOLN
1.0000 mg | Freq: Once | INTRAMUSCULAR | Status: AC
  Administered 2018-06-10: 1 mg via INTRAMUSCULAR
  Filled 2018-06-10: qty 1

## 2018-06-10 MED ORDER — METHOCARBAMOL 500 MG PO TABS
500.0000 mg | ORAL_TABLET | Freq: Four times a day (QID) | ORAL | Status: DC | PRN
  Administered 2018-06-10 – 2018-06-15 (×9): 500 mg via ORAL
  Filled 2018-06-10 (×9): qty 1

## 2018-06-10 MED ORDER — METHOCARBAMOL 1000 MG/10ML IJ SOLN
500.0000 mg | Freq: Four times a day (QID) | INTRAVENOUS | Status: DC | PRN
  Filled 2018-06-10: qty 5

## 2018-06-10 MED ORDER — ACETAMINOPHEN 325 MG PO TABS: 650.0000 mg | ORAL_TABLET | Freq: Four times a day (QID) | ORAL | Status: DC | PRN

## 2018-06-10 MED ORDER — DIPHENHYDRAMINE HCL 12.5 MG/5ML PO ELIX: 12.5000 mg | ORAL_SOLUTION | ORAL | Status: DC | PRN

## 2018-06-10 MED ORDER — ACETAMINOPHEN 650 MG RE SUPP: 650.0000 mg | Freq: Four times a day (QID) | RECTAL | Status: DC | PRN

## 2018-06-10 MED ORDER — OXYCODONE HCL 5 MG PO TABS
5.0000 mg | ORAL_TABLET | ORAL | Status: DC | PRN
  Administered 2018-06-10 – 2018-06-14 (×7): 10 mg via ORAL
  Filled 2018-06-10 (×8): qty 2

## 2018-06-10 MED ORDER — SODIUM CHLORIDE 0.9 % IV SOLN
INTRAVENOUS | Status: DC
  Administered 2018-06-10: 21:00:00 via INTRAVENOUS

## 2018-06-10 MED ORDER — ZOLPIDEM TARTRATE 5 MG PO TABS: 5.0000 mg | ORAL_TABLET | Freq: Every evening | ORAL | Status: DC | PRN

## 2018-06-10 MED ORDER — MORPHINE SULFATE (PF) 4 MG/ML IV SOLN
4.0000 mg | INTRAVENOUS | Status: DC | PRN
  Administered 2018-06-10 – 2018-06-14 (×17): 4 mg via INTRAVENOUS
  Filled 2018-06-10 (×18): qty 1

## 2018-06-10 NOTE — ED Provider Notes (Signed)
[facility] Emergency Department Provider Note  Time seen: 4:36 PM  I have reviewed the triage vital signs and the nursing notes.   HISTORY  Chief Complaint Leg Pain    HPI Nathan Chambers is a 53 y.o. male with a past medical history of anxiety, bipolar, CKD, hypertension, hyperlipidemia, chronic pain, presents to the emergency department for left leg pain.  According to the patient he suffered an infection to his leg last year which caused an infection in the knee.  States the knee had to be opened washed out and planted antibiotic seeds into the knee.  Patient has been on oral antibiotics ever since for the past 6 to 8 months.  Patient ran out of his antibiotics 1 week ago for 2 days, restarted them after 2 days.  Patient states however over the past 2 to 3 days he has had progressively worsening left leg pain to the point where it has become unbearable per patient.  Patient does state he is on chronic oxycodone 5 mg tablets which are not helping.  Denies any known fever.   Past Medical History:  Diagnosis Date  . Anxiety   . Arthritis    knees, hands  . Bipolar disorder ([place])   . Chronic kidney disease    arf 2014 due to dehydration  . Headache   . Hemorrhoids   . Hyperlipidemia   . Hypertension   . LFT elevation   . NAFLD (nonalcoholic fatty liver disease)   . Sleep apnea     Patient Active Problem List   Diagnosis Date Noted  . Arthritis, septic, knee ([place]) 12/08/2017  . Hyperglycemia, unspecified 02/16/2016  . Anxiety 01/26/2015  . Hyperlipidemia 01/26/2015  . Hypertension 01/26/2015    Past Surgical History:  Procedure Laterality Date  . ACHILLES TENDON SURGERY Right 10/30/2015   Procedure: ACHILLES TENDON REPAIR/ HEGLAND OSTECTOMY;  Surgeon: [name], DPM;  Location: [facility] ORS;  Service: Podiatry;  Laterality: Right;  . COLONOSCOPY    . COLONOSCOPY WITH PROPOFOL N/A 04/07/2017   Procedure: COLONOSCOPY WITH PROPOFOL;  Surgeon: Toledo,  Benay Pike, MD;  Location: [facility] ENDOSCOPY;  Service: Gastroenterology;  Laterality: N/A;  . FRACTURE SURGERY Left    MIDDLE FINGER  . HERNIA REPAIR    . I&D KNEE WITH POLY EXCHANGE Left 12/08/2017   Procedure: IRRIGATION AND DEBRIDEMENT KNEE WITH POLY EXCHANGE;  Surgeon: Dereck Leep, MD;  Location: [facility] ORS;  Service: Orthopedics;  Laterality: Left;  . JOINT REPLACEMENT    . KNEE ARTHROTOMY Left 12/08/2017   Procedure: KNEE ARTHROTOMY, I&D, [place] OUT, POLY EXCHANGE;  Surgeon: Dereck Leep, MD;  Location: [facility] ORS;  Service: Orthopedics;  Laterality: Left;  . LIPOMA EXCISION    . TOTAL KNEE ARTHROPLASTY Bilateral 04/19/2016   Procedure: TOTAL KNEE BILATERAL;  Surgeon: Hessie Knows, MD;  Location: [facility] ORS;  Service: Orthopedics;  Laterality: Bilateral;  . VASECTOMY      Prior to Admission medications   Medication Sig Start Date End Date Taking? Authorizing Provider  acidophilus (RISAQUAD) CAPS capsule Take 1 capsule by mouth daily. 05/01/18   Tsosie Billing, MD  Alpha-Lipoic Acid 600 MG CAPS Take 600 mg by mouth daily. 05/25/17   [provider]  ALPRAZolam Duanne Moron) 0.25 MG tablet Take 0.5-1 tablets by mouth every 12 (twelve) hours as needed for anxiety. 11/22/17   [provider]  amLODipine-benazepril (LOTREL) 10-40 MG capsule Take 1 capsule by mouth daily. 12/06/17   [provider]  aspirin EC  81 MG tablet Take 81 mg by mouth daily.    [provider]  busPIRone (BUSPAR) 5 MG tablet Take 5 mg by mouth 2 (two) times daily.    [provider]  chlorthalidone (HYGROTON) 25 MG tablet Take 12.5 mg by mouth daily. 11/04/17   [provider]  enoxaparin (LOVENOX) 30 MG/0.3ML injection Inject 0.3 mLs (30 mg total) into the skin every 12 (twelve) hours for 10 days. 12/12/17 12/22/17  Gladstone Lighter, MD  ferrous sulfate 325 (65 FE) MG tablet Take 1 tablet (325 mg total) by mouth 2 (two) times daily with a meal. Patient not taking: Reported  on 01/29/2018 12/12/17   Gladstone Lighter, MD  folic acid (FOLVITE) 1 MG tablet Take 1 mg by mouth daily.     [provider]  gabapentin (NEURONTIN) 600 MG tablet Take 1 tablet (600 mg total) by mouth 3 (three) times daily. 12/12/17   Gladstone Lighter, MD  gemfibrozil (LOPID) 600 MG tablet Take 600 mg by mouth 2 (two) times daily before a meal.    [provider]  meloxicam (MOBIC) 15 MG tablet Take 15 mg by mouth daily. 09/10/17   [provider]  multivitamin-lutein (OCUVITE-LUTEIN) CAPS capsule Take 1 capsule by mouth daily.    [provider]  naloxone (NARCAN) 0.4 MG/ML injection Inject 1 mL (0.4 mg total) into the vein as needed (respiratory rate < 10). Patient not taking: Reported on 01/29/2018 04/22/16   Duanne Guess, PA-C  oxyCODONE (OXY IR/ROXICODONE) 5 MG immediate release tablet Take 1-2 tablets (5-10 mg total) by mouth every 6 (six) hours as needed for moderate pain or severe pain. 12/12/17   Gladstone Lighter, MD  PARoxetine (PAXIL) 20 MG tablet Take 60 mg by mouth daily.     [provider]  rifampin (RIFADIN) 300 MG capsule Take 1 capsule (300 mg total) by mouth 2 (two) times daily. 06/05/18   Tsosie Billing, MD  rOPINIRole (REQUIP) 0.5 MG tablet Take 0.5 mg by mouth at bedtime. 09/20/17   [provider]  senna-docusate (SENOKOT-S) 8.6-50 MG tablet Take 1 tablet by mouth 2 (two) times daily. Patient not taking: Reported on 01/29/2018 12/12/17   Gladstone Lighter, MD  sulfamethoxazole-trimethoprim (BACTRIM DS,SEPTRA DS) 800-160 MG tablet Take 1 tablet by mouth 2 (two) times daily. 06/05/18   Tsosie Billing, MD  vitamin B-12 (CYANOCOBALAMIN) 1000 MCG tablet Take 1,000 mcg by mouth daily.     [provider]  vitamin C (ASCORBIC ACID) 500 MG tablet Take 500 mg by mouth daily.    [provider]    Allergies  Allergen Reactions  . Etodolac Itching    History reviewed. No pertinent family  history.  Social History Social History   Tobacco Use  . Smoking status: Never Smoker  . Smokeless tobacco: Former Systems developer    Types: Chew  Substance Use Topics  . Alcohol use: No  . Drug use: No    Review of Systems Constitutional: Negative for fever. Cardiovascular: Negative for chest pain. Respiratory: Negative for shortness of breath. Gastrointestinal: Negative for abdominal pain Genitourinary: Negative for urinary compaints Musculoskeletal: Left leg pain around the left knee.  10/10.  Aching.   Skin: Negative for skin complaints  Neurological: Negative for headache All other ROS negative  ____________________________________________   PHYSICAL EXAM:  VITAL SIGNS: ED Triage Vitals  Enc Vitals Group     BP 06/10/18 1531 131/78     Pulse Rate 06/10/18 1531 92     Resp  06/10/18 1531 (!) 22     Temp 06/10/18 1531 98 F (36.7 C)     Temp Source 06/10/18 1531 Oral     SpO2 06/10/18 1531 96 %     Weight 06/10/18 1528 (!) 327 lb (148.3 kg)     Height 06/10/18 1528 _0  (1.905 m)     Head Circumference --      Peak Flow --      Pain Score 06/10/18 1528 10     Pain Loc --      Pain Edu? --      Excl. in [place]? --     Constitutional: Alert and oriented. Well appearing and in no distress. Eyes: Normal exam ENT   Head: Normocephalic and atraumatic.   Mouth/Throat: Mucous membranes are moist. Cardiovascular: Normal rate, regular rhythm. No murmur Respiratory: Normal respiratory effort without tachypnea nor retractions. Breath sounds are clear Gastrointestinal: Soft and nontender. No distention.   Musculoskeletal: Patient has mild tenderness of the left knee, mild edema of the left leg and calf compared to the right leg.  No calf tenderness.  No obvious erythema or sign of active infection.  Appears to be neurovascular intact distally.  Pain with range of motion.  Status post knee replacement. Neurologic:  Normal speech and language. No gross focal neurologic  deficits Skin:  Skin is warm, dry and intact.  Psychiatric: Mood and affect are normal.  ____________________________________________   RADIOLOGY  LEFT knee prosthesis without definite acute complication.  Large knee joint effusion.  ____________________________________________   INITIAL IMPRESSION / ASSESSMENT AND PLAN / ED COURSE  Pertinent labs & imaging results that were available during my care of the patient were reviewed by me and considered in my medical decision making (see chart for details).  Patient presents to the emergency department for left leg/knee pain worsening over the past several days.  History of knee replacement 0347 complicated by an infection 8 months ago requiring washout, antibiotic bead placement and chronic oral antibiotics.  Differential at this time would include knee injury, knee infection, DVT.  We will check labs, ultrasound and x-ray and continue to closely monitor.  Patient's labs have resulted showing a mild leukocytosis of 11,000.  X-ray shows a large knee effusion.  Ultrasound pending.  Ultrasound negative for DVT.  I discussed patient Dr. Rudene Christians of orthopedics, recommends getting an ESR.  ESR last month was 8, now it is up to 39.  Dr. Rudene Christians is concerned about a joint infection, we will admit to orthopedics.  We will check blood cultures and order pain medication for the patient if needed.  Dr. Rudene Christians would like to hold off on antibiotics at this time to ensure a good culture tomorrow.  ____________________________________________   FINAL CLINICAL IMPRESSION(S) / ED DIAGNOSES  Left knee pain   Harvest Dark, MD 06/10/18 [phone]

## 2018-06-10 NOTE — ED Triage Notes (Signed)
Pt presents to ED via POV with c/o L leg pain. Pt states cut his L leg with a weed eater last summer. Pt states was dx with bacterial infection after the injury and had to have surgery due to infection. Pt states 6-8 months after initial injury ID MD prescribed abx for patient. Pt states began abx on Friday. Pt states is now unable to ambulate due to pain.

## 2018-06-10 NOTE — ED Notes (Signed)
Patient transported to Ultrasound 

## 2018-06-10 NOTE — ED Notes (Signed)
Discussed care with Dr. Kerman Passey, see orders for details.

## 2018-06-11 ENCOUNTER — Inpatient Hospital Stay: Payer: 59 | Admitting: Anesthesiology

## 2018-06-11 ENCOUNTER — Encounter: Payer: Self-pay | Admitting: *Deleted

## 2018-06-11 ENCOUNTER — Encounter
Admission: EM | Disposition: A | Discharge status: Home / Self Care | Payer: Self-pay | Source: Home / Self Care | Attending: Orthopedic Surgery

## 2018-06-11 HISTORY — PX: TOTAL KNEE REVISION: SHX996

## 2018-06-11 LAB — CBC
HCT: 42.9 % (ref 39.0–52.0)
Hemoglobin: 14.2 g/dL (ref 13.0–17.0)
MCH: 30.2 pg (ref 26.0–34.0)
MCHC: 33.1 g/dL (ref 30.0–36.0)
MCV: 91.3 fL (ref 80.0–100.0)
Platelets: 251 10*3/uL (ref 150–400)
RBC: 4.7 MIL/uL (ref 4.22–5.81)
RDW: 12.7 % (ref 11.5–15.5)
WBC: 8.7 10*3/uL (ref 4.0–10.5)
nRBC: 0 % (ref 0.0–0.2)

## 2018-06-11 LAB — CREATININE, SERUM
Creatinine, Ser: 0.9 mg/dL (ref 0.61–1.24)
GFR calc Af Amer: >60 mL/min (ref >60)
GFR calc non Af Amer: >60 mL/min (ref >60)

## 2018-06-11 LAB — C-REACTIVE PROTEIN: CRP: 21.5 mg/dL — ABNORMAL HIGH (ref <1.0)

## 2018-06-11 SURGERY — TOTAL KNEE REVISION
Anesthesia: General | Site: Knee | Laterality: Left

## 2018-06-11 MED ORDER — AMLODIPINE BESYLATE 10 MG PO TABS
10.0000 mg | ORAL_TABLET | Freq: Every day | ORAL | Status: DC
  Administered 2018-06-13 – 2018-06-15 (×3): 10 mg via ORAL
  Filled 2018-06-11 (×3): qty 1

## 2018-06-11 MED ORDER — SUCCINYLCHOLINE CHLORIDE 20 MG/ML IJ SOLN
INTRAMUSCULAR | Status: DC | PRN
  Administered 2018-06-11: 200 mg via INTRAVENOUS

## 2018-06-11 MED ORDER — TRAMADOL HCL 50 MG PO TABS
50.0000 mg | ORAL_TABLET | Freq: Four times a day (QID) | ORAL | Status: DC
  Administered 2018-06-11 – 2018-06-14 (×5): 50 mg via ORAL
  Filled 2018-06-11 (×6): qty 1

## 2018-06-11 MED ORDER — METOCLOPRAMIDE HCL 10 MG PO TABS: 5.0000 mg | ORAL_TABLET | Freq: Three times a day (TID) | ORAL | Status: DC | PRN

## 2018-06-11 MED ORDER — ALUM & MAG HYDROXIDE-SIMETH 200-200-20 MG/5ML PO SUSP: 30.0000 mL | ORAL | Status: DC | PRN

## 2018-06-11 MED ORDER — ENOXAPARIN SODIUM 30 MG/0.3ML ~~LOC~~ SOLN
30.0000 mg | Freq: Two times a day (BID) | SUBCUTANEOUS | Status: DC
  Administered 2018-06-12 – 2018-06-15 (×7): 30 mg via SUBCUTANEOUS
  Filled 2018-06-11 (×7): qty 0.3

## 2018-06-11 MED ORDER — HYDROMORPHONE HCL 1 MG/ML IJ SOLN
INTRAMUSCULAR | Status: AC
  Administered 2018-06-11: 0.5 mg via INTRAVENOUS
  Filled 2018-06-11: qty 1

## 2018-06-11 MED ORDER — FENTANYL CITRATE (PF) 100 MCG/2ML IJ SOLN
INTRAMUSCULAR | Status: AC
  Filled 2018-06-11: qty 2

## 2018-06-11 MED ORDER — CHLORTHALIDONE 25 MG PO TABS
12.5000 mg | ORAL_TABLET | Freq: Every day | ORAL | Status: DC
  Administered 2018-06-12 – 2018-06-15 (×4): 12.5 mg via ORAL
  Filled 2018-06-11 (×5): qty 0.5

## 2018-06-11 MED ORDER — DOCUSATE SODIUM 100 MG PO CAPS
100.0000 mg | ORAL_CAPSULE | Freq: Two times a day (BID) | ORAL | Status: DC
  Administered 2018-06-11 – 2018-06-15 (×7): 100 mg via ORAL
  Filled 2018-06-11 (×7): qty 1

## 2018-06-11 MED ORDER — DEXAMETHASONE SODIUM PHOSPHATE 10 MG/ML IJ SOLN
INTRAMUSCULAR | Status: DC | PRN
  Administered 2018-06-11: 5 mg via INTRAVENOUS

## 2018-06-11 MED ORDER — LIDOCAINE HCL (CARDIAC) PF 100 MG/5ML IV SOSY
PREFILLED_SYRINGE | INTRAVENOUS | Status: DC | PRN
  Administered 2018-06-11: 100 mg via INTRAVENOUS

## 2018-06-11 MED ORDER — DEXMEDETOMIDINE HCL 200 MCG/2ML IV SOLN
INTRAVENOUS | Status: DC | PRN
  Administered 2018-06-11: 12 ug via INTRAVENOUS

## 2018-06-11 MED ORDER — ACETAMINOPHEN 500 MG PO TABS
1000.0000 mg | ORAL_TABLET | Freq: Four times a day (QID) | ORAL | Status: AC
  Administered 2018-06-11 – 2018-06-12 (×3): 1000 mg via ORAL
  Filled 2018-06-11 (×3): qty 2

## 2018-06-11 MED ORDER — ONDANSETRON HCL 4 MG PO TABS: 4.0000 mg | ORAL_TABLET | Freq: Four times a day (QID) | ORAL | Status: DC | PRN

## 2018-06-11 MED ORDER — AMLODIPINE BESY-BENAZEPRIL HCL 10-40 MG PO CAPS: 1.0000 | ORAL_CAPSULE | Freq: Every day | ORAL | Status: DC

## 2018-06-11 MED ORDER — ONDANSETRON HCL 4 MG/2ML IJ SOLN
4.0000 mg | Freq: Once | INTRAMUSCULAR | Status: AC
  Administered 2018-06-11: 4 mg via INTRAVENOUS

## 2018-06-11 MED ORDER — SUGAMMADEX SODIUM 500 MG/5ML IV SOLN
INTRAVENOUS | Status: AC
  Filled 2018-06-11: qty 5

## 2018-06-11 MED ORDER — FENTANYL CITRATE (PF) 250 MCG/5ML IJ SOLN
INTRAMUSCULAR | Status: AC
  Filled 2018-06-11: qty 5

## 2018-06-11 MED ORDER — ONDANSETRON HCL 4 MG/2ML IJ SOLN
INTRAMUSCULAR | Status: DC | PRN
  Administered 2018-06-11: 4 mg via INTRAVENOUS

## 2018-06-11 MED ORDER — ROCURONIUM BROMIDE 100 MG/10ML IV SOLN
INTRAVENOUS | Status: DC | PRN
  Administered 2018-06-11: 20 mg via INTRAVENOUS

## 2018-06-11 MED ORDER — ONDANSETRON HCL 4 MG/2ML IJ SOLN
INTRAMUSCULAR | Status: AC
  Administered 2018-06-11: 4 mg via INTRAVENOUS
  Filled 2018-06-11: qty 2

## 2018-06-11 MED ORDER — FENTANYL CITRATE (PF) 100 MCG/2ML IJ SOLN
INTRAMUSCULAR | Status: AC
  Administered 2018-06-11: 50 ug via INTRAVENOUS
  Filled 2018-06-11: qty 2

## 2018-06-11 MED ORDER — OXYCODONE HCL 5 MG/5ML PO SOLN: 5.0000 mg | Freq: Once | ORAL | Status: DC | PRN

## 2018-06-11 MED ORDER — SODIUM CHLORIDE 0.9 % IV SOLN
INTRAVENOUS | Status: DC
  Administered 2018-06-11 – 2018-06-12 (×2): via INTRAVENOUS

## 2018-06-11 MED ORDER — VITAMIN B-12 1000 MCG PO TABS
1000.0000 ug | ORAL_TABLET | Freq: Every day | ORAL | Status: DC
  Administered 2018-06-12 – 2018-06-15 (×4): 1000 ug via ORAL
  Filled 2018-06-11 (×3): qty 1

## 2018-06-11 MED ORDER — SEVOFLURANE IN SOLN
RESPIRATORY_TRACT | Status: AC
  Filled 2018-06-11: qty 250

## 2018-06-11 MED ORDER — MAGNESIUM CITRATE PO SOLN
1.0000 | Freq: Once | ORAL | Status: DC | PRN
  Filled 2018-06-11: qty 296

## 2018-06-11 MED ORDER — BUSPIRONE HCL 10 MG PO TABS
5.0000 mg | ORAL_TABLET | Freq: Two times a day (BID) | ORAL | Status: DC
  Administered 2018-06-11 – 2018-06-15 (×8): 5 mg via ORAL
  Filled 2018-06-11 (×8): qty 1

## 2018-06-11 MED ORDER — VANCOMYCIN HCL 10 G IV SOLR
1750.0000 mg | Freq: Two times a day (BID) | INTRAVENOUS | Status: DC
  Administered 2018-06-11 – 2018-06-15 (×8): 1750 mg via INTRAVENOUS
  Filled 2018-06-11 (×9): qty 1750

## 2018-06-11 MED ORDER — MAGNESIUM HYDROXIDE 400 MG/5ML PO SUSP: 30.0000 mL | Freq: Every day | ORAL | Status: DC | PRN

## 2018-06-11 MED ORDER — MIDAZOLAM HCL 2 MG/2ML IJ SOLN
INTRAMUSCULAR | Status: DC | PRN
  Administered 2018-06-11: 2 mg via INTRAVENOUS

## 2018-06-11 MED ORDER — GEMFIBROZIL 600 MG PO TABS
600.0000 mg | ORAL_TABLET | Freq: Two times a day (BID) | ORAL | Status: DC
  Administered 2018-06-12 – 2018-06-15 (×7): 600 mg via ORAL
  Filled 2018-06-11 (×10): qty 1

## 2018-06-11 MED ORDER — VANCOMYCIN HCL IN DEXTROSE 1-5 GM/200ML-% IV SOLN: 1000.0000 mg | Freq: Once | INTRAVENOUS | Status: DC

## 2018-06-11 MED ORDER — MIDAZOLAM HCL 2 MG/2ML IJ SOLN
INTRAMUSCULAR | Status: AC
  Filled 2018-06-11: qty 2

## 2018-06-11 MED ORDER — HYDROMORPHONE HCL 1 MG/ML IJ SOLN
0.5000 mg | INTRAMUSCULAR | Status: AC | PRN
  Administered 2018-06-11 (×4): 0.5 mg via INTRAVENOUS

## 2018-06-11 MED ORDER — ONDANSETRON HCL 4 MG/2ML IJ SOLN
4.0000 mg | Freq: Four times a day (QID) | INTRAMUSCULAR | Status: DC | PRN
  Administered 2018-06-11: 4 mg via INTRAVENOUS
  Filled 2018-06-11: qty 2

## 2018-06-11 MED ORDER — OCUVITE-LUTEIN PO CAPS
1.0000 | ORAL_CAPSULE | Freq: Every day | ORAL | Status: DC
  Administered 2018-06-12 – 2018-06-15 (×4): 1 via ORAL
  Filled 2018-06-11 (×5): qty 1

## 2018-06-11 MED ORDER — LACTATED RINGERS IV SOLN: INTRAVENOUS | Status: DC

## 2018-06-11 MED ORDER — VANCOMYCIN HCL 1000 MG IV SOLR
INTRAVENOUS | Status: DC | PRN
  Administered 2018-06-11: 1000 mg via INTRAVENOUS

## 2018-06-11 MED ORDER — ONDANSETRON HCL 4 MG/2ML IJ SOLN
INTRAMUSCULAR | Status: AC
  Filled 2018-06-11: qty 2

## 2018-06-11 MED ORDER — PROPOFOL 10 MG/ML IV BOLUS
INTRAVENOUS | Status: DC | PRN
  Administered 2018-06-11: 200 mg via INTRAVENOUS

## 2018-06-11 MED ORDER — ROPINIROLE HCL 1 MG PO TABS
0.5000 mg | ORAL_TABLET | Freq: Every day | ORAL | Status: DC
  Administered 2018-06-11 – 2018-06-14 (×4): 0.5 mg via ORAL
  Filled 2018-06-11 (×4): qty 1

## 2018-06-11 MED ORDER — BENAZEPRIL HCL 20 MG PO TABS
40.0000 mg | ORAL_TABLET | Freq: Every day | ORAL | Status: DC
  Administered 2018-06-14: 40 mg via ORAL
  Filled 2018-06-11 (×5): qty 2

## 2018-06-11 MED ORDER — PROPOFOL 10 MG/ML IV BOLUS
INTRAVENOUS | Status: AC
  Filled 2018-06-11: qty 20

## 2018-06-11 MED ORDER — VANCOMYCIN HCL IN DEXTROSE 1-5 GM/200ML-% IV SOLN
INTRAVENOUS | Status: AC
  Filled 2018-06-11: qty 200

## 2018-06-11 MED ORDER — PHENOL 1.4 % MT LIQD
1.0000 | OROMUCOSAL | Status: DC | PRN
  Filled 2018-06-11: qty 177

## 2018-06-11 MED ORDER — METOCLOPRAMIDE HCL 5 MG/ML IJ SOLN: 5.0000 mg | Freq: Three times a day (TID) | INTRAMUSCULAR | Status: DC | PRN

## 2018-06-11 MED ORDER — FENTANYL CITRATE (PF) 100 MCG/2ML IJ SOLN
INTRAMUSCULAR | Status: DC | PRN
  Administered 2018-06-11: 50 ug via INTRAVENOUS
  Administered 2018-06-11 (×2): 100 ug via INTRAVENOUS

## 2018-06-11 MED ORDER — VANCOMYCIN HCL 1000 MG IV SOLR
INTRAVENOUS | Status: AC
  Filled 2018-06-11: qty 1000

## 2018-06-11 MED ORDER — FENTANYL CITRATE (PF) 100 MCG/2ML IJ SOLN
25.0000 ug | INTRAMUSCULAR | Status: DC | PRN
  Administered 2018-06-11 (×4): 50 ug via INTRAVENOUS

## 2018-06-11 MED ORDER — DULOXETINE HCL 60 MG PO CPEP
60.0000 mg | ORAL_CAPSULE | Freq: Every day | ORAL | Status: DC
  Administered 2018-06-12 – 2018-06-15 (×4): 60 mg via ORAL
  Filled 2018-06-11 (×5): qty 1

## 2018-06-11 MED ORDER — MENTHOL 3 MG MT LOZG
1.0000 | LOZENGE | OROMUCOSAL | Status: DC | PRN
  Filled 2018-06-11: qty 9

## 2018-06-11 MED ORDER — FOLIC ACID 1 MG PO TABS
1.0000 mg | ORAL_TABLET | Freq: Every day | ORAL | Status: DC
  Administered 2018-06-12 – 2018-06-15 (×4): 1 mg via ORAL
  Filled 2018-06-11 (×4): qty 1

## 2018-06-11 MED ORDER — OXYCODONE HCL 5 MG PO TABS: 5.0000 mg | ORAL_TABLET | Freq: Once | ORAL | Status: DC | PRN

## 2018-06-11 MED ORDER — ALPRAZOLAM 0.25 MG PO TABS
0.1250 mg | ORAL_TABLET | Freq: Two times a day (BID) | ORAL | Status: DC | PRN
  Administered 2018-06-12 – 2018-06-14 (×2): 0.25 mg via ORAL
  Filled 2018-06-11 (×3): qty 1

## 2018-06-11 MED ORDER — BISACODYL 5 MG PO TBEC
5.0000 mg | DELAYED_RELEASE_TABLET | Freq: Every day | ORAL | Status: DC | PRN
  Administered 2018-06-13: 5 mg via ORAL
  Filled 2018-06-11: qty 1

## 2018-06-11 MED ORDER — VITAMIN C 500 MG PO TABS
500.0000 mg | ORAL_TABLET | Freq: Every day | ORAL | Status: DC
  Administered 2018-06-12 – 2018-06-15 (×4): 500 mg via ORAL
  Filled 2018-06-11 (×3): qty 1

## 2018-06-11 MED ORDER — SUGAMMADEX SODIUM 500 MG/5ML IV SOLN
INTRAVENOUS | Status: DC | PRN
  Administered 2018-06-11: 300 mg via INTRAVENOUS

## 2018-06-11 SURGICAL SUPPLY — 59 items
BANDAGE ACE 6X5 VEL STRL LF (GAUZE/BANDAGES/DRESSINGS) ×3 IMPLANT
BLADE SAW 1 (BLADE) ×3 IMPLANT
BLADE SAW 1/2 (BLADE) ×3 IMPLANT
CANISTER SUCT 1200ML W/VALVE (MISCELLANEOUS) ×3 IMPLANT
CANISTER SUCT 3000ML PPV (MISCELLANEOUS) ×6 IMPLANT
CHLORAPREP W/TINT 26ML (MISCELLANEOUS) ×6 IMPLANT
COOLER POLAR GLACIER W/PUMP (MISCELLANEOUS) ×3 IMPLANT
COVER BACK TABLE 60X90IN (DRAPES) ×3 IMPLANT
COVER WAND RF STERILE (DRAPES) ×3 IMPLANT
CUFF TOURN 24 STER (MISCELLANEOUS) IMPLANT
CUFF TOURN 30 STER DUAL PORT (MISCELLANEOUS) IMPLANT
DRAPE SHEET LG 3/4 BI-LAMINATE (DRAPES) ×12 IMPLANT
ELECT CAUTERY BLADE 6.4 (BLADE) ×3 IMPLANT
ELECT REM PT RETURN 9FT ADLT (ELECTROSURGICAL) ×3
ELECTRODE REM PT RTRN 9FT ADLT (ELECTROSURGICAL) ×1 IMPLANT
GAUZE PETRO XEROFOAM 1X8 (MISCELLANEOUS) ×3 IMPLANT
GAUZE SPONGE 4X4 12PLY STRL (GAUZE/BANDAGES/DRESSINGS) ×3 IMPLANT
GLOVE BIOGEL PI IND STRL 9 (GLOVE) ×1 IMPLANT
GLOVE BIOGEL PI INDICATOR 9 (GLOVE) ×2
GLOVE INDICATOR 8.0 STRL GRN (GLOVE) ×3 IMPLANT
GLOVE SURG ORTHO 8.0 STRL STRW (GLOVE) ×3 IMPLANT
GLOVE SURG SYN 9.0  PF PI (GLOVE) ×2
GLOVE SURG SYN 9.0 PF PI (GLOVE) ×1 IMPLANT
GOWN SRG 2XL LVL 4 RGLN SLV (GOWNS) ×1 IMPLANT
GOWN STRL NON-REIN 2XL LVL4 (GOWNS) ×2
GOWN STRL REUS W/ TWL LRG LVL3 (GOWN DISPOSABLE) ×1 IMPLANT
GOWN STRL REUS W/ TWL XL LVL3 (GOWN DISPOSABLE) ×1 IMPLANT
GOWN STRL REUS W/TWL LRG LVL3 (GOWN DISPOSABLE) ×2
GOWN STRL REUS W/TWL XL LVL3 (GOWN DISPOSABLE) ×2
HOLDER FOLEY CATH W/STRAP (MISCELLANEOUS) ×3 IMPLANT
HOOD PEEL AWAY FLYTE STAYCOOL (MISCELLANEOUS) ×6 IMPLANT
INSERT TIBIAL FIXED SZ5 LT 12M (Insert) ×3 IMPLANT
KIT STIMULAN RAPID CURE 5CC (Orthopedic Implant) ×3 IMPLANT
KIT TURNOVER KIT A (KITS) ×3 IMPLANT
KNIFE SCULPS 14X20 (INSTRUMENTS) ×3 IMPLANT
NEEDLE SPNL 18GX3.5 QUINCKE PK (NEEDLE) ×3 IMPLANT
NEEDLE SPNL 20GX3.5 QUINCKE YW (NEEDLE) ×3 IMPLANT
NS IRRIG 1000ML POUR BTL (IV SOLUTION) ×3 IMPLANT
PACK TOTAL KNEE (MISCELLANEOUS) ×3 IMPLANT
PAD WRAPON POLAR KNEE (MISCELLANEOUS) ×1 IMPLANT
PULSAVAC PLUS IRRIG FAN TIP (DISPOSABLE) ×3
SCALPEL PROTECTED #10 DISP (BLADE) ×6 IMPLANT
SOL .9 NS 3000ML IRR  AL (IV SOLUTION) ×2
SOL .9 NS 3000ML IRR UROMATIC (IV SOLUTION) ×1 IMPLANT
STAPLER SKIN PROX 35W (STAPLE) ×3 IMPLANT
SUCTION FRAZIER HANDLE 10FR (MISCELLANEOUS) ×2
SUCTION TUBE FRAZIER 10FR DISP (MISCELLANEOUS) ×1 IMPLANT
SUT DVC 2 QUILL PDO  T11 36X36 (SUTURE) ×2
SUT DVC 2 QUILL PDO T11 36X36 (SUTURE) ×1 IMPLANT
SUT TICRON 2-0 30IN 311381 (SUTURE) IMPLANT
SUT V-LOC 90 ABS DVC 3-0 CL (SUTURE) ×3 IMPLANT
SWAB CULTURE AMIES ANAERIB BLU (MISCELLANEOUS) IMPLANT
SYR 20CC LL (SYRINGE) ×3 IMPLANT
SYR 50ML LL SCALE MARK (SYRINGE) ×6 IMPLANT
TIP FAN IRRIG PULSAVAC PLUS (DISPOSABLE) ×1 IMPLANT
TOWEL OR 17X26 4PK STRL BLUE (TOWEL DISPOSABLE) ×3 IMPLANT
TOWER CARTRIDGE SMART MIX (DISPOSABLE) ×3 IMPLANT
TRAY FOLEY MTR SLVR 16FR STAT (SET/KITS/TRAYS/PACK) ×3 IMPLANT
WRAPON POLAR PAD KNEE (MISCELLANEOUS) ×3

## 2018-06-11 NOTE — Progress Notes (Signed)
Pt refused DVT prophylaxis and bed alarm

## 2018-06-11 NOTE — Op Note (Signed)
06/11/2018  3:23 PM  PATIENT:  Nathan Chambers  53 y.o. male  PRE-OPERATIVE DIAGNOSIS:  Infection left knee s/p TKA  POST-OPERATIVE DIAGNOSIS:  Infection left knee s/p TKA  PROCEDURE:  Procedure(s): INCISION AND DRAINAGE OF LEFT KNEE, POLY EXCHANGE (Left)  SURGEON: Laurene Footman, MD  ASSISTANTS: None  ANESTHESIA:   general  EBL:  Total I/O In: 600 [I.V.:600] Out: 50 [Blood:50]  BLOOD ADMINISTERED:none  DRAINS: none   LOCAL MEDICATIONS USED:  NONE  SPECIMEN:  Source of Specimen:  Multiple cultures left knee synovial fluid and synovium  DISPOSITION OF SPECIMEN:  Microbiology  COUNTS:  YES  TOURNIQUET: 60 minutes at 300 mmHg  IMPLANTS: Medacta GM K sphere left 5 tibia insert 12 mm thick  DICTATION: .Dragon Dictation patient was brought to the operating room and after adequate general anesthesia was obtained the left leg was prepped and draped in the usual sterile fashion with a tourniquet applied to the upper thigh.  After patient identification and timeout procedures were completed, tourniquet was raised and the prior midline skin incision was utilized.  A medial parapatellar arthrotomy was performed and there appeared to be purulent material within the joint with 2 cultures obtained of the synovial fluid.  Next the arthrotomy was opened up further with removal of scar tissue along the patellar tendon and medial capsule to provide adequate access to the proximal tibia and the gutters were debrided with use of a rongeurgetting out synovium with a medial and lateral gutter specimen sent for separate culture.  The knee was debrided with a rongeur and cautery to get to healthy tissue.  Next the versa jet was used first on the lining of the knee making sure the gutters were clean as well as the suprapatellar pouch following this the pot prior polyethylene component was removed and the entire femoral surface as well as tibial surfaces were debrided along the posterior capsule with the  Versajet.  After thorough irrigation debridement of the knee with the above techniques the knee was irrigated with 3 L of Betadine solution followed by 3 L of saline.  Trial was placed as there was a little bit of instability and a 12 mm insert was very stable was chosen for the final component this was inserted after change of gloves and instruments to fresh gloves and instruments that had not been used for the debridement.  The polyethylene component was snapped into place with the set screw tightened using the torque screwdriver.  The arthrotomy was then repaired using a heavy Quill 30V lock subcutaneously skin staples and incisional wound VAC followed by an Ace wrap and letting the tourniquet down.  PLAN OF CARE: Continue as inpatient  PATIENT DISPOSITION:  PACU - hemodynamically stable.

## 2018-06-11 NOTE — H&P (Signed)
Subjective:   Patient is a 53 y.o. male presents with left knee pain. Onset of symptoms was gradual starting 2 days ago with gradually worsening course since that time. The pain is located all around the knee. Patient describes the pain as aching continuous and rated as moderate and severe. Pain has been associated with history of prior left knee infection after total knee. Patient denies any recent injury to the knee. Symptoms are aggravated by motion. Symptoms improve with holding still. Past history includes prior incision and drainage for acute septic knee last August.  He has been on suppressive antibiotics but his prescription ran out and pharmacy did not get it filled in time so is been off his antibiotics for 2 days.  Previous studies include prior ESR was 9 and now is up to 39 consistent with acute infection.  Patient Active Problem List   Diagnosis Date Noted  . Infection of total left knee replacement ([place]) 06/10/2018  . Arthritis, septic, knee ([place]) 12/08/2017  . Hyperglycemia, unspecified 02/16/2016  . Anxiety 01/26/2015  . Hyperlipidemia 01/26/2015  . Hypertension 01/26/2015   Past Medical History:  Diagnosis Date  . Anxiety   . Arthritis    knees, hands  . Bipolar disorder ([place])   . Chronic kidney disease    arf 2014 due to dehydration  . Headache   . Hemorrhoids   . Hyperlipidemia   . Hypertension   . LFT elevation   . NAFLD (nonalcoholic fatty liver disease)   . Sleep apnea     Past Surgical History:  Procedure Laterality Date  . ACHILLES TENDON SURGERY Right 10/30/2015   Procedure: ACHILLES TENDON REPAIR/ HEGLAND OSTECTOMY;  Surgeon: [name], DPM;  Location: [facility] ORS;  Service: Podiatry;  Laterality: Right;  . COLONOSCOPY    . COLONOSCOPY WITH PROPOFOL N/A 04/07/2017   Procedure: COLONOSCOPY WITH PROPOFOL;  Surgeon: Toledo, Benay Pike, MD;  Location: [facility] ENDOSCOPY;  Service: Gastroenterology;  Laterality: N/A;  . FRACTURE SURGERY Left    MIDDLE FINGER  .  HERNIA REPAIR    . I&D KNEE WITH POLY EXCHANGE Left 12/08/2017   Procedure: IRRIGATION AND DEBRIDEMENT KNEE WITH POLY EXCHANGE;  Surgeon: Dereck Leep, MD;  Location: [facility] ORS;  Service: Orthopedics;  Laterality: Left;  . JOINT REPLACEMENT    . KNEE ARTHROTOMY Left 12/08/2017   Procedure: KNEE ARTHROTOMY, I&D, [place] OUT, POLY EXCHANGE;  Surgeon: Dereck Leep, MD;  Location: [facility] ORS;  Service: Orthopedics;  Laterality: Left;  . LIPOMA EXCISION    . TOTAL KNEE ARTHROPLASTY Bilateral 04/19/2016   Procedure: TOTAL KNEE BILATERAL;  Surgeon: Hessie Knows, MD;  Location: [facility] ORS;  Service: Orthopedics;  Laterality: Bilateral;  . VASECTOMY      Medications Prior to Admission  Medication Sig Dispense Refill Last Dose  . ALPRAZolam (XANAX) 0.25 MG tablet Take 0.5-1 tablets by mouth every 12 (twelve) hours as needed for anxiety.  0 prn at prn  . amLODipine-benazepril (LOTREL) 10-40 MG capsule Take 1 capsule by mouth daily.   06/10/2018 at 0800  . busPIRone (BUSPAR) 5 MG tablet Take 5 mg by mouth 2 (two) times daily.   06/10/2018 at 0800  . chlorthalidone (HYGROTON) 25 MG tablet Take 12.5 mg by mouth daily.   06/10/2018 at 0800  . DULoxetine (CYMBALTA) 60 MG capsule Take 60 mg by mouth daily.   06/10/2018 at 0800  . folic acid (FOLVITE) 1 MG tablet Take 1 mg by mouth daily.    06/10/2018 at 0800  .  gemfibrozil (LOPID) 600 MG tablet Take 600 mg by mouth 2 (two) times daily before a meal.   06/10/2018 at 0800  . meloxicam (MOBIC) 15 MG tablet Take 15 mg by mouth daily.   06/10/2018 at 0800  . multivitamin-lutein (OCUVITE-LUTEIN) CAPS capsule Take 1 capsule by mouth daily.   06/10/2018 at 0800  . naloxone (NARCAN) 0.4 MG/ML injection Inject 1 mL (0.4 mg total) into the vein as needed (respiratory rate < 10). 1 mL 0 prn at prn  . rifampin (RIFADIN) 300 MG capsule Take 1 capsule (300 mg total) by mouth 2 (two) times daily. 60 capsule 0 06/10/2018 at 0800  . rOPINIRole (REQUIP) 0.5 MG tablet Take 0.5 mg by mouth at bedtime.   0 prn at prn  . sulfamethoxazole-trimethoprim (BACTRIM DS,SEPTRA DS) 800-160 MG tablet Take 1 tablet by mouth 2 (two) times daily. 60 tablet 0 06/10/2018 at 0800  . vitamin B-12 (CYANOCOBALAMIN) 1000 MCG tablet Take 1,000 mcg by mouth daily.    06/10/2018 at 0800  . vitamin C (ASCORBIC ACID) 500 MG tablet Take 500 mg by mouth daily.   06/10/2018 at 0800  . enoxaparin (LOVENOX) 30 MG/0.3ML injection Inject 0.3 mLs (30 mg total) into the skin every 12 (twelve) hours for 10 days. 3.3 mL 0   . oxyCODONE (OXY IR/ROXICODONE) 5 MG immediate release tablet Take 1-2 tablets (5-10 mg total) by mouth every 6 (six) hours as needed for moderate pain or severe pain. 25 tablet 0 prn at prn   Allergies  Allergen Reactions  . Etodolac Itching    Social History   Tobacco Use  . Smoking status: Never Smoker  . Smokeless tobacco: Former Systems developer    Types: Chew  Substance Use Topics  . Alcohol use: No    History reviewed. No pertinent family history.  Review of Systems Pertinent items are noted in HPI.  Objective:   Patient Vitals for the past 8 hrs:  BP Temp Temp src Pulse Resp SpO2  06/11/18 0752 136/86 98 F (36.7 C) Oral 66 17 100 %   No intake/output data recorded. No intake/output data recorded.    BP 136/86 (BP Location: Right Arm)   Pulse 66   Temp 98 F (36.7 C) (Oral)   Resp 17   Ht '6\' 3"'$  (1.905 m)   Wt (!) 148.3 kg   SpO2 100%   BMI 40.87 kg/m   General Appearance:    Alert, cooperative, no distress, appears stated age  Head:    Normocephalic, without obvious abnormality, atraumatic  Eyes:    PERRL, conjunctiva/corneas clear, EOM's intact, fundi    benign, both eyes       Ears:    Normal TM's and external ear canals, both ears  Nose:   Nares normal, septum midline, mucosa normal, no drainage    or sinus tenderness  Throat:   Lips, mucosa, and tongue normal; teeth and gums normal  Neck:   Supple, symmetrical, trachea midline, no adenopathy;       thyroid:  No  enlargement/tenderness/nodules; no carotid   bruit or JVD  Back:     Symmetric, no curvature, ROM normal, no CVA tenderness  Lungs:     Clear to auscultation bilaterally, respirations unlabored  Chest wall:    No tenderness or deformity  Heart:    Regular rate and rhythm, S1 and S2 normal, no murmur, rub   or gallop  Abdomen:     Soft, non-tender, bowel sounds active all four quadrants,  no masses, no organomegaly  Genitalia:    Normal male without lesion, discharge or tenderness  Rectal:    Normal tone, normal prostate, no masses or tenderness;   guaiac negative stool  Extremities:   Extremities normal, atraumatic, no cyanosis or edema there is swelling to the left knee with an effusion present range of motion is 5 to 95 degrees with well-healed prior scar there is some warmth of the knee.  Pulses:   2+ and symmetric all extremities  Skin:   Skin color, texture, turgor normal, no rashes or lesions  Lymph nodes:   Cervical, supraclavicular, and axillary nodes normal  Neurologic:   CNII-XII intact. Normal strength, sensation and reflexes      throughout     Data Review elevated ESR C-reactive protein pending  Assessment:   Active Problems:   Infection of total left knee replacement (HCC)   Plan:   Removal of polyethylene component thorough debridement of the knee replacement polyethylene component with new component and placement of antibiotic beads, antibiotics will be held until cultures obtained.  Surgery is scheduled for today

## 2018-06-11 NOTE — Consult Note (Signed)
Pharmacy Antibiotic Note  Nathan Chambers is a 53 y.o. male admitted on 06/10/2018 with Wound infection.  Pharmacy has been consulted for vancomycin dosing.  Plan: Vancomycin 1750 mg IV Q 12 hrs. Goal AUC 400-550. Expected AUC: 473.9 SCr used: 0.88  Patient ordered Vancomycin 1000 mg IV once as pre-op prophylaxis.  Height: 6\' 3"  (190.5 cm) Weight: (!) 327 lb (148.3 kg) IBW/kg (Calculated) : 84.5  Temp (24hrs), Avg:97.8 F (36.6 C), Min:97.3 F (36.3 C), Max:98.4 F (36.9 C)  Recent Labs  Lab 06/10/18 1536  WBC 11.1*  CREATININE 0.88    Estimated Creatinine Clearance: 149.3 mL/min (by C-G formula based on SCr of 0.88 mg/dL).    Allergies  Allergen Reactions  . Etodolac Itching    Antimicrobials this admission: Vancomycin 2/10 >>   Dose adjustments this admission:   Microbiology results: 2/9 BCx: pending, NGTD <12 hrs  Wound cultures: pending  Thank you for allowing pharmacy to be a part of this patient's care.  Nathan Chambers, PharmD Clinical Pharmacist 06/11/2018 3:48 PM

## 2018-06-11 NOTE — Anesthesia Preprocedure Evaluation (Signed)
Anesthesia Evaluation  Patient identified by MRN, date of birth, ID band Patient awake    Reviewed: Allergy & Precautions, NPO status , Patient's Chart, lab work & pertinent test results  History of Anesthesia Complications Negative for: history of anesthetic complications  Airway Mallampati: III  TM Distance: >3 FB     Dental  (+) Chipped, Dental Advidsory Given, Poor Dentition, Missing   Pulmonary neg pulmonary ROS, neg shortness of breath, sleep apnea ,    Pulmonary exam normal        Cardiovascular Exercise Tolerance: Good hypertension, Pt. on medications (-) angina(-) CAD, (-) Past MI, (-) Cardiac Stents and (-) CABG Normal cardiovascular exam(-) dysrhythmias (-) Valvular Problems/Murmurs     Neuro/Psych  Headaches, neg Seizures PSYCHIATRIC DISORDERS Anxiety Bipolar Disorder    GI/Hepatic negative GI ROS, Neg liver ROS, neg GERD  ,Non-alcoholic fatty liver   Endo/Other  neg diabetesMorbid obesity  Renal/GU Renal InsufficiencyRenal disease  negative genitourinary   Musculoskeletal  (+) Arthritis ,   Abdominal   Peds negative pediatric ROS (+)  Hematology negative hematology ROS (+)   Anesthesia Other Findings Past Medical History: No date: Anxiety No date: Arthritis     Comment: knees, hands No date: Bipolar disorder (HCC) No date: Chronic kidney disease     Comment: arf 2014 due to dehydration No date: Headache No date: Hemorrhoids No date: Hyperlipidemia No date: Hypertension No date: LFT elevation No date: NAFLD (nonalcoholic fatty liver disease)  Reproductive/Obstetrics                             Anesthesia Physical  Anesthesia Plan  ASA: III  Anesthesia Plan: General   Post-op Pain Management:    Induction: Intravenous  PONV Risk Score and Plan: Ondansetron, Dexamethasone and Midazolam  Airway Management Planned: Oral ETT  Additional Equipment:   Intra-op  Plan:   Post-operative Plan: Extubation in OR  Informed Consent: I have reviewed the patients History and Physical, chart, labs and discussed the procedure including the risks, benefits and alternatives for the proposed anesthesia with the patient or authorized representative who has indicated his/her understanding and acceptance.     Dental advisory given  Plan Discussed with: CRNA and Surgeon  Anesthesia Plan Comments: (Patient consented for risks of anesthesia including but not limited to:  - adverse reactions to medications - damage to teeth, lips or other oral mucosa - sore throat or hoarseness - Damage to heart, brain, lungs or loss of life  Patient voiced understanding.)        Anesthesia Quick Evaluation

## 2018-06-11 NOTE — Anesthesia Procedure Notes (Signed)
Procedure Name: Intubation Date/Time: 06/11/2018 1:33 PM Performed by: Justus Memory, CRNA Pre-anesthesia Checklist: Patient identified, Patient being monitored, Timeout performed, Emergency Drugs available and Suction available Patient Re-evaluated:Patient Re-evaluated prior to induction Oxygen Delivery Method: Circle system utilized Preoxygenation: Pre-oxygenation with 100% oxygen Induction Type: IV induction Ventilation: Mask ventilation without difficulty Laryngoscope Size: Mac and 3 Grade View: Grade II Tube type: Oral Tube size: 7.0 mm Number of attempts: 1 Airway Equipment and Method: Stylet Placement Confirmation: ETT inserted through vocal cords under direct vision,  positive ETCO2 and breath sounds checked- equal and bilateral Secured at: 21 cm Tube secured with: Tape Dental Injury: Teeth and Oropharynx as per pre-operative assessment  Difficulty Due To: Difficult Airway- due to large tongue Future Recommendations: Recommend- induction with short-acting agent, and alternative techniques readily available

## 2018-06-11 NOTE — Anesthesia Post-op Follow-up Note (Signed)
Anesthesia QCDR form completed.        

## 2018-06-11 NOTE — Transfer of Care (Signed)
Immediate Anesthesia Transfer of Care Note  Patient: Nathan Chambers  Procedure(s) Performed: INCISION AND DRAINAGE OF LEFT KNEE, POLY EXCHANGE (Left Knee)  Patient Location: PACU  Anesthesia Type:General  Level of Consciousness: awake and alert   Airway & Oxygen Therapy: Patient Spontanous Breathing  Post-op Assessment: Report given to RN and Post -op Vital signs reviewed and stable  Post vital signs: Reviewed  Last Vitals:  Vitals Value Taken Time  BP    Temp    Pulse 86 06/11/2018  3:10 PM  Resp 18 06/11/2018  3:09 PM  SpO2 100 % 06/11/2018  3:10 PM  Vitals shown include unvalidated device data.  Last Pain:  Vitals:   06/11/18 1300  TempSrc: Temporal  PainSc: 3          Complications: No apparent anesthesia complications

## 2018-06-12 ENCOUNTER — Encounter: Payer: Self-pay | Admitting: Orthopedic Surgery

## 2018-06-12 DIAGNOSIS — F339 Major depressive disorder, recurrent, unspecified: Secondary | ICD-10-CM

## 2018-06-12 DIAGNOSIS — F419 Anxiety disorder, unspecified: Secondary | ICD-10-CM

## 2018-06-12 DIAGNOSIS — Z96653 Presence of artificial knee joint, bilateral: Secondary | ICD-10-CM

## 2018-06-12 DIAGNOSIS — I10 Essential (primary) hypertension: Secondary | ICD-10-CM

## 2018-06-12 DIAGNOSIS — K760 Fatty (change of) liver, not elsewhere classified: Secondary | ICD-10-CM

## 2018-06-12 DIAGNOSIS — Z87891 Personal history of nicotine dependence: Secondary | ICD-10-CM

## 2018-06-12 DIAGNOSIS — Z978 Presence of other specified devices: Secondary | ICD-10-CM

## 2018-06-12 DIAGNOSIS — B9561 Methicillin susceptible Staphylococcus aureus infection as the cause of diseases classified elsewhere: Secondary | ICD-10-CM

## 2018-06-12 DIAGNOSIS — E785 Hyperlipidemia, unspecified: Secondary | ICD-10-CM

## 2018-06-12 DIAGNOSIS — Z79899 Other long term (current) drug therapy: Secondary | ICD-10-CM

## 2018-06-12 DIAGNOSIS — T8454XA Infection and inflammatory reaction due to internal left knee prosthesis, initial encounter: Principal | ICD-10-CM

## 2018-06-12 DIAGNOSIS — Z888 Allergy status to other drugs, medicaments and biological substances status: Secondary | ICD-10-CM

## 2018-06-12 DIAGNOSIS — M00062 Staphylococcal arthritis, left knee: Secondary | ICD-10-CM

## 2018-06-12 LAB — BASIC METABOLIC PANEL
Anion gap: 10 (ref 5–15)
BUN: 11 mg/dL (ref 6–20)
CO2: 24 mmol/L (ref 22–32)
Calcium: 8.9 mg/dL (ref 8.9–10.3)
Chloride: 103 mmol/L (ref 98–111)
Creatinine, Ser: 0.7 mg/dL (ref 0.61–1.24)
GFR calc Af Amer: >60 mL/min (ref >60)
GFR calc non Af Amer: >60 mL/min (ref >60)
Glucose, Bld: 97 mg/dL (ref 70–99)
Potassium: 3.4 mmol/L — ABNORMAL LOW (ref 3.5–5.1)
Sodium: 137 mmol/L (ref 135–145)

## 2018-06-12 LAB — CBC
HCT: 43.4 % (ref 39.0–52.0)
HEMOGLOBIN: 14.3 g/dL (ref 13.0–17.0)
MCH: 30.3 pg (ref 26.0–34.0)
MCHC: 32.9 g/dL (ref 30.0–36.0)
MCV: 91.9 fL (ref 80.0–100.0)
Platelets: 278 10*3/uL (ref 150–400)
RBC: 4.72 MIL/uL (ref 4.22–5.81)
RDW: 12.7 % (ref 11.5–15.5)
WBC: 7.5 10*3/uL (ref 4.0–10.5)
nRBC: 0 % (ref 0.0–0.2)

## 2018-06-12 NOTE — Evaluation (Signed)
Physical Therapy Evaluation Patient Details Name: Nathan Chambers MRN: [id number] DOB: 12-17-1965 Today's Date: 06/12/2018   History of Present Illness  52 y/o male who had b/l knee replacements in 2017, has had subsequent issues with L knee infections with I&D and now I&D with poly exchange.  Clinical Impression  Pt perseverating on pain t/o some of the exam, but did not seem overly limited due to pain during functional tasks (considerable pain with ~15 minutes of bed exercises, specifically with ROM tasks).  Pt appeared to lack at least 6 degrees of extension with PT overpressure, but once standing nearly achieved TKE and did not have any issues with buckling.  Gait training for appropriate walker use, cadence, steppage, but overall pt did surprisingly well with ambulation.  He also has some stiffness and c/o severe pain with knee flexion tasks, but once warmed up with multiple reps did get to ~75 degrees of flexion.  Pt will be safe to return home once medically cleared for d/c.     Follow Up Recommendations Follow surgeon's recommendation for DC plan and follow-up therapies    Equipment Recommendations  None recommended by PT    Recommendations for Other Services       Precautions / Restrictions Precautions Precautions: Fall Restrictions LLE Weight Bearing: Weight bearing as tolerated      Mobility  Bed Mobility Overal bed mobility: Independent             General bed mobility comments: Pt able to get himself to sitting EOB with relative ease, some pain hesitation  Transfers Overall transfer level: Modified independent Equipment used: Rolling walker (2 wheeled)             General transfer comment: Heavy use of UEs to assume standing, did not rely heavy on walker once up  Ambulation/Gait Ambulation/Gait assistance: Supervision Gait Distance (Feet): 200 Feet Assistive device: Rolling walker (2 wheeled)       General Gait Details: Pt did better than expected  with ambulation given his ROM and weakness limitations during initial testing.  He did not have any buckling, was able to maintain confident and smooth cadence and showed very little hesitation or fatigue  Stairs            Wheelchair Mobility    Modified Rankin (Stroke Patients Only)       Balance Overall balance assessment: Modified Independent                                           Pertinent Vitals/Pain Pain Assessment: 0-10 Pain Score: 5  Pain Location: L knee    Home Living Family/patient expects to be discharged to:: Private residence Living Arrangements: Spouse/significant other   Type of Home: House Home Access: Stairs to enter Entrance Stairs-Rails: None Entrance Stairs-Number of Steps: 2 Home Layout: One level Home Equipment: Environmental consultant - 2 wheels;Shower seat;Walker - 4 wheels      Prior Function Level of Independence: Independent         Comments: Pt is normally able to be active and out of the home      Hand Dominance        Extremity/Trunk Assessment   Upper Extremity Assessment Upper Extremity Assessment: Overall WFL for tasks assessed    Lower Extremity Assessment Lower Extremity Assessment: (grossly 3+/5 on L, unable to SLR)       Communication  Communication: No difficulties  Cognition Arousal/Alertness: Awake/alert Behavior During Therapy: WFL for tasks assessed/performed Overall Cognitive Status: Within Functional Limits for tasks assessed                                        General Comments      Exercises Total Joint Exercises Ankle Circles/Pumps: AROM;10 reps Quad Sets: Strengthening;10 reps Heel Slides: Strengthening;10 reps(resisted leg extension) Hip ABduction/ADduction: Strengthening;10 reps Straight Leg Raises: AAROM;10 reps Knee Flexion: PROM;5 reps Goniometric ROM: 6-74   Assessment/Plan    PT Assessment Patient needs continued PT services  PT Problem List Decreased  strength;Decreased range of motion;Decreased activity tolerance;Decreased balance;Decreased mobility;Decreased knowledge of use of DME;Decreased safety awareness;Pain       PT Treatment Interventions DME instruction;Gait training;Stair training;Functional mobility training;Therapeutic activities;Balance training;Therapeutic exercise;Neuromuscular re-education;Patient/family education    PT Goals (Current goals can be found in the Care Plan section)  Acute Rehab PT Goals Patient Stated Goal: "Get this L knee working right again" PT Goal Formulation: With patient Time For Goal Achievement: 06/26/18 Potential to Achieve Goals: Good    Frequency BID   Barriers to discharge        Co-evaluation               AM-PAC PT "6 Clicks" Mobility  Outcome Measure Help needed turning from your back to your side while in a flat bed without using bedrails?: None Help needed moving from lying on your back to sitting on the side of a flat bed without using bedrails?: None Help needed moving to and from a bed to a chair (including a wheelchair)?: None Help needed standing up from a chair using your arms (e.g., wheelchair or bedside chair)?: None Help needed to walk in hospital room?: None Help needed climbing 3-5 steps with a railing? : A Little 6 Click Score: 23    End of Session Equipment Utilized During Treatment: Gait belt Activity Tolerance: Patient limited by pain;Patient tolerated treatment well Patient left: with bed alarm set;with call bell/phone within reach Nurse Communication: Mobility status PT Visit Diagnosis: Muscle weakness (generalized) (M62.81);Difficulty in walking, not elsewhere classified (R26.2);Pain Pain - Right/Left: Left Pain - part of body: Knee    Time: 4599-7741 PT Time Calculation (min) (ACUTE ONLY): 46 min   Charges:   PT Evaluation $PT Eval Low Complexity: 1 Low PT Treatments $Gait Training: 8-22 mins $Therapeutic Exercise: 8-22 mins        Kreg Shropshire, DPT 06/12/2018, 11:52 AM

## 2018-06-12 NOTE — Progress Notes (Signed)
Physical Therapy Treatment Patient Details Name: Nathan Chambers MRN: [id number] DOB: [birth year] Today's Date: 06/12/2018    History of Present Illness 53 y/o male who had b/l knee replacements in 2017, has had subsequent issues with L knee infections with I&D and now I&D with poly exchange.    PT Comments    Pt continues to do functionally well (gait, mobility, transfers) with good safety and confidence with ambulation and ability to negotiate up/down steps (though he did need some extra cuing to insure safe and appropriate execution).  Pt still with ROM limitations and difficulty fully engaging the quad/performing SLRs, but overall is confident that as his pain goes down "everything will snap into place."    Follow Up Recommendations  Follow surgeon's recommendation for DC plan and follow-up therapies;Home health PT     Equipment Recommendations  None recommended by PT    Recommendations for Other Services       Precautions / Restrictions Precautions Precautions: Fall Restrictions LLE Weight Bearing: Weight bearing as tolerated    Mobility  Bed Mobility Overal bed mobility: Independent             General bed mobility comments: in recliner pre and post session  Transfers Overall transfer level: Modified independent Equipment used: Rolling walker (2 wheeled)             General transfer comment: Less reliance on UEs, able to control descent with good awareness  Ambulation/Gait Ambulation/Gait assistance: Supervision Gait Distance (Feet): 200 Feet Assistive device: Rolling walker (2 wheeled)       General Gait Details: Pt again ambulated with good speed, confidence.  He did need consistent cuing for posture but overall did not have any safey issues, buckling or other issues.    Stairs Stairs: Yes Stairs assistance: Supervision Stair Management: No rails Number of Stairs: 4 General stair comments: Pt is able to negotiate up/down steps with backward  strategy safely.     Wheelchair Mobility    Modified Rankin (Stroke Patients Only)       Balance Overall balance assessment: Modified Independent                                          Cognition Arousal/Alertness: Awake/alert Behavior During Therapy: WFL for tasks assessed/performed Overall Cognitive Status: Within Functional Limits for tasks assessed                                        Exercises Total Joint Exercises Quad Sets: Strengthening;10 reps Short Arc Quad: AAROM;10 reps(able to manage labored AROM after a few reps, no TKE) Hip ABduction/ADduction: Strengthening;10 reps Straight Leg Raises: AAROM;10 reps Knee Flexion: PROM;5 reps Goniometric ROM: Pt continues to have stiffness in both directions    General Comments        Pertinent Vitals/Pain Pain Score: 6  Pain Location: L knee    Home Living Family/patient expects to be discharged to:: Private residence                    Prior Function            PT Goals (current goals can now be found in the care plan section) Progress towards PT goals: Progressing toward goals    Frequency    BID  PT Plan Current plan remains appropriate    Co-evaluation              AM-PAC PT "6 Clicks" Mobility   Outcome Measure  Help needed turning from your back to your side while in a flat bed without using bedrails?: None Help needed moving from lying on your back to sitting on the side of a flat bed without using bedrails?: None Help needed moving to and from a bed to a chair (including a wheelchair)?: None Help needed standing up from a chair using your arms (e.g., wheelchair or bedside chair)?: None Help needed to walk in hospital room?: None Help needed climbing 3-5 steps with a railing? : None 6 Click Score: 24    End of Session Equipment Utilized During Treatment: Gait belt Activity Tolerance: Patient limited by pain;Patient tolerated treatment  well Patient left: with call bell/phone within reach;with chair alarm set Nurse Communication: Mobility status PT Visit Diagnosis: Muscle weakness (generalized) (M62.81);Difficulty in walking, not elsewhere classified (R26.2);Pain Pain - Right/Left: Left Pain - part of body: Knee     Time: 0175-1025 PT Time Calculation (min) (ACUTE ONLY): 31 min  Charges:  $Gait Training: 8-22 mins $Therapeutic Exercise: 8-22 mins                     Kreg Shropshire, DPT 06/12/2018, 3:23 PM

## 2018-06-12 NOTE — Care Management Note (Signed)
Case Management Note  Patient Details  Name: SCOTTI KOSTA MRN: [id number] Date of Birth: 02-08-1966  Subjective/Objective:                   Met with patient to discuss DC Plan and needs Patient has all of the DME he needs including RW, scooter, [facility] Patient states he does not need HH OT this is his third surgery and he can do it on his own.  He plans to go to [employer] and use equipment, I encouraged him not to drive until released to do so, he said he wouldn't drive for a few weeks I provided the patient with the [facility] list per CMS and gave him my contact information in case he changes his mind,  He stated that when his wife gets here if they believe he needs PT at home he will call me and let me know before going home.   RNCM to check the price of Lovenox before the patient discharges.   Action/Plan:  HH List provided per CMS.gov patient declines HH at this time   Expected Discharge Date:                  Expected Discharge Plan:     In-House Referral:     Discharge planning Services  CM Consult  Post Acute Care Choice:    Choice offered to:     DME Arranged:    DME Agency:     HH Arranged:  PT HH Agency:     Status of Service:  In process, will continue to follow  If discussed at Long Length of Stay Meetings, dates discussed:    Additional Comments:  Su Hilt, RN 06/12/2018, 2:14 PM

## 2018-06-12 NOTE — Consult Note (Signed)
NAME: Nathan Chambers  DOB: 12/15/1965  MRN: [id number]  Date/Time: 06/12/2018 7:23 PM  REQUESTING PROVIDER: Dr.Menz Subjective:  REASON FOR CONSULT: left Knee PJI  ? Nathan Chambers is a 53 y.o. male with a history of with a history of bilateral total knee arthroplasty hypertension, hyperlipidemia and nonalcoholic fatty liver disease and is being treated for left knee PJI due to MSSA for the past 6 months is admitted to the hospital with acute left knee pain of 2 days duration.  Pt was in [facility] between 12/08/17 and 12/12/17 for left knee pain and swelling following an injury he sustained by weed mover to the left calf.  Patient had left knee arthrotomy with irrigation and debridement and polyethylene exchange and placement of antibiotic impregnated bio composite beeds on 12/08/2017 by Dr. Marry Guan. . The synovial culture came back as staph aureus and he was treated with 6 weeks of  IV cefazolin and PO rifampin. After the completion of IV he has been on Bactrim and rifampin orally for the past 5 months. He was followed by me as Op and his last labs from 05/09/18 showed CRP of 1 and ESR of 8 . According to patient he did not take bactrim and rifampin for 2 days and on 06/08/18 he collected his prescription and took both his meds. That night he developed sudden onset left knee pain and could not walk. He lay in bed the whole of Saturday and Sunday and as it was getting worse he came to the ED on Sunday afternoon. He had Doppler of the left leg and DVT was ruled out. WBC was 11, CRP 21.5 and ESR 39. He was seen by Dr.Menz and taken for  I/D of left knee with polyexchange and tissue culture was sent. As per his note "There appeared to be purulent material within the joint and 2 cultures obtained of the synovial fluid" Pt is currently on vancomycin and has a wound vac in place. He never had fever or chills He did not hurt his knee. He did not do any heavy work or exert himself.  Past Medical History:  Diagnosis  Date  . Anxiety   . Arthritis    knees, hands  . Bipolar disorder ([place])   . Chronic kidney disease    arf 2014 due to dehydration  . Headache   . Hemorrhoids   . Hyperlipidemia   . Hypertension   . LFT elevation   . NAFLD (nonalcoholic fatty liver disease)   . Sleep apnea     Past Surgical History:  Procedure Laterality Date  . ACHILLES TENDON SURGERY Right 10/30/2015   Procedure: ACHILLES TENDON REPAIR/ HEGLAND OSTECTOMY;  Surgeon: [name], DPM;  Location: [facility] ORS;  Service: Podiatry;  Laterality: Right;  . COLONOSCOPY    . COLONOSCOPY WITH PROPOFOL N/A 04/07/2017   Procedure: COLONOSCOPY WITH PROPOFOL;  Surgeon: Toledo, Benay Pike, MD;  Location: [facility] ENDOSCOPY;  Service: Gastroenterology;  Laterality: N/A;  . FRACTURE SURGERY Left    MIDDLE FINGER  . HERNIA REPAIR    . I&D KNEE WITH POLY EXCHANGE Left 12/08/2017   Procedure: IRRIGATION AND DEBRIDEMENT KNEE WITH POLY EXCHANGE;  Surgeon: Dereck Leep, MD;  Location: [facility] ORS;  Service: Orthopedics;  Laterality: Left;  . JOINT REPLACEMENT    . KNEE ARTHROTOMY Left 12/08/2017   Procedure: KNEE ARTHROTOMY, I&D, [place] OUT, POLY EXCHANGE;  Surgeon: Dereck Leep, MD;  Location: [facility] ORS;  Service: Orthopedics;  Laterality: Left;  . LIPOMA EXCISION    .  TOTAL KNEE ARTHROPLASTY Bilateral 04/19/2016   Procedure: TOTAL KNEE BILATERAL;  Surgeon: Hessie Knows, MD;  Location: [facility] ORS;  Service: Orthopedics;  Laterality: Bilateral;  . TOTAL KNEE REVISION Left 06/11/2018   Procedure: INCISION AND DRAINAGE OF LEFT KNEE, POLY EXCHANGE;  Surgeon: Hessie Knows, MD;  Location: [facility] ORS;  Service: Orthopedics;  Laterality: Left;  Marland Kitchen VASECTOMY       Social History: Lives with his wife Ex-smoker No alcohol or illicit drug use  Not Working Has a pitbull  Family history Hypertension mother  osteoporosis mother  diabetes Father    Allergies  Allergen Reactions  . Etodolac Itching   ? Current Facility-Administered Medications   Medication Dose Route Frequency Provider Last Rate Last Dose  . 0.9 %  sodium chloride infusion   Intravenous Continuous Hessie Knows, MD   Stopped at 06/11/18 1026  . 0.9 %  sodium chloride infusion   Intravenous Continuous Hessie Knows, MD   Stopped at 06/12/18 1316  . acetaminophen (TYLENOL) tablet 650 mg  650 mg Oral Q6H PRN Hessie Knows, MD       Or  . acetaminophen (TYLENOL) suppository 650 mg  650 mg Rectal Q6H PRN Hessie Knows, MD      . ALPRAZolam Duanne Moron) tablet 0.125-0.25 mg  0.125-0.25 mg Oral Q12H PRN Hessie Knows, MD      . alum & mag hydroxide-simeth (MAALOX/MYLANTA) 200-200-20 MG/5ML suspension 30 mL  30 mL Oral Q4H PRN Hessie Knows, MD      . amLODipine (NORVASC) tablet 10 mg  10 mg Oral Daily Hessie Knows, MD       Or  . benazepril (LOTENSIN) tablet 40 mg  40 mg Oral Daily Hessie Knows, MD      . bisacodyl (DULCOLAX) EC tablet 5 mg  5 mg Oral Daily PRN Hessie Knows, MD      . busPIRone (BUSPAR) tablet 5 mg  5 mg Oral BID Hessie Knows, MD   5 mg at 06/12/18 5093  . chlorthalidone (HYGROTON) tablet 12.5 mg  12.5 mg Oral Daily Hessie Knows, MD   12.5 mg at 06/12/18 0830  . diphenhydrAMINE (BENADRYL) 12.5 MG/5ML elixir 12.5-25 mg  12.5-25 mg Oral Q4H PRN Hessie Knows, MD      . docusate sodium (COLACE) capsule 100 mg  100 mg Oral BID Hessie Knows, MD   100 mg at 06/12/18 0829  . DULoxetine (CYMBALTA) DR capsule 60 mg  60 mg Oral Daily Hessie Knows, MD   60 mg at 06/12/18 0830  . enoxaparin (LOVENOX) injection 30 mg  30 mg Subcutaneous Q12H Hessie Knows, MD   30 mg at 06/12/18 2671  . folic acid (FOLVITE) tablet 1 mg  1 mg Oral Daily Hessie Knows, MD   1 mg at 06/12/18 0829  . gemfibrozil (LOPID) tablet 600 mg  600 mg Oral BID AC Hessie Knows, MD   600 mg at 06/12/18 1735  . lactated ringers infusion   Intravenous Continuous Hessie Knows, MD      . magnesium citrate solution 1 Bottle  1 Bottle Oral Once PRN Hessie Knows, MD      . magnesium hydroxide (MILK OF  MAGNESIA) suspension 30 mL  30 mL Oral Daily PRN Hessie Knows, MD      . menthol-cetylpyridinium (CEPACOL) lozenge 3 mg  1 lozenge Oral PRN Hessie Knows, MD       Or  . phenol (CHLORASEPTIC) mouth spray 1 spray  1 spray Mouth/Throat PRN Hessie Knows, MD      .  methocarbamol (ROBAXIN) tablet 500 mg  500 mg Oral Q6H PRN Hessie Knows, MD   500 mg at 06/12/18 1148   Or  . methocarbamol (ROBAXIN) 500 mg in dextrose 5 % 50 mL IVPB  500 mg Intravenous Q6H PRN Hessie Knows, MD      . metoCLOPramide (REGLAN) tablet 5-10 mg  5-10 mg Oral Q8H PRN Hessie Knows, MD       Or  . metoCLOPramide (REGLAN) injection 5-10 mg  5-10 mg Intravenous Q8H PRN Hessie Knows, MD      . morphine 4 MG/ML injection 4 mg  4 mg Intravenous Q3H PRN Hessie Knows, MD   4 mg at 06/12/18 1822  . multivitamin-lutein (OCUVITE-LUTEIN) capsule 1 capsule  1 capsule Oral Daily Hessie Knows, MD   1 capsule at 06/12/18 0829  . ondansetron (ZOFRAN) tablet 4 mg  4 mg Oral Q6H PRN Hessie Knows, MD       Or  . ondansetron [facility]) injection 4 mg  4 mg Intravenous Q6H PRN Hessie Knows, MD   4 mg at 06/11/18 1719  . oxyCODONE (Oxy IR/ROXICODONE) immediate release tablet 5-10 mg  5-10 mg Oral Q3H PRN Hessie Knows, MD   10 mg at 06/12/18 1148  . rOPINIRole (REQUIP) tablet 0.5 mg  0.5 mg Oral QHS Hessie Knows, MD   0.5 mg at 06/11/18 2046  . traMADol (ULTRAM) tablet 50 mg  50 mg Oral Q6H Hessie Knows, MD   50 mg at 06/12/18 [phone]  . vancomycin (VANCOCIN) 1,750 mg in sodium chloride 0.9 % 500 mL IVPB  1,750 mg Intravenous Q12H Colin Broach A, RPH 250 mL/hr at 06/12/18 1735 1,750 mg at 06/12/18 1735  . vancomycin (VANCOCIN) IVPB 1000 mg/200 mL premix  1,000 mg Intravenous Once Hessie Knows, MD      . vitamin B-12 (CYANOCOBALAMIN) tablet 1,000 mcg  1,000 mcg Oral Daily Hessie Knows, MD   1,000 mcg at 06/12/18 0829  . vitamin C (ASCORBIC ACID) tablet 500 mg  500 mg Oral Daily Hessie Knows, MD   500 mg at 06/12/18 9811  . zolpidem  (AMBIEN) tablet 5 mg  5 mg Oral QHS PRN,MR X 1 Hessie Knows, MD         Abtx:  Anti-infectives (From admission, onward)   Start     Dose/Rate Route Frequency Ordered Stop   06/11/18 1800  vancomycin (VANCOCIN) 1,750 mg in sodium chloride 0.9 % 500 mL IVPB     1,750 mg 250 mL/hr over 120 Minutes Intravenous Every 12 hours 06/11/18 1551     06/11/18 1415  vancomycin (VANCOCIN) 1-5 GM/200ML-% IVPB    Note to Pharmacy:  Norton Blizzard  : cabinet override      06/11/18 1415 06/12/18 0229   06/11/18 1400  vancomycin (VANCOCIN) IVPB 1000 mg/200 mL premix     1,000 mg 200 mL/hr over 60 Minutes Intravenous  Once 06/11/18 1333        REVIEW OF SYSTEMS:  Const: negative fever, negative chills, negative weight loss Eyes: negative diplopia or visual changes, negative eye pain ENT: negative coryza, negative sore throat Resp: negative cough, hemoptysis, dyspnea Cards: negative for chest pain, palpitations, lower extremity edema GU: negative for frequency, dysuria and hematuria GI: Negative for abdominal pain, diarrhea, bleeding, constipation Skin: negative for rash and pruritus Heme: negative for easy bruising and gum/nose bleeding MS: severe left knee apin Neurolo: says he has neuropathy negative for headaches, dizziness, vertigo, memory problems  Psych: negative for feelings of anxiety, depression  Endocrine: no polyuria or polydipsia Allergy/Immunology- etodolac?  Objective:  VITALS:  BP (!) 141/86 (BP Location: Right Arm)   Pulse 79   Temp 97.8 F (36.6 C)   Resp 18   Ht '6\' 3"'$  (1.905 m)   Wt (!) 148.3 kg   SpO2 99%   BMI 40.87 kg/m  PHYSICAL EXAM:  General: Alert, cooperative, no distress, appears stated age.  Head: Normocephalic, without obvious abnormality, atraumatic. Eyes: Conjunctivae clear, anicteric sclerae. Pupils are equal ENT Nares normal. No drainage or sinus tenderness. Lips, mucosa, and tongue normal. No Thrush Neck: Supple, symmetrical, no adenopathy,  thyroid: non tender no carotid bruit and no JVD. Back: No CVA tenderness. Lungs: Clear to auscultation bilaterally. No Wheezing or Rhonchi. No rales. Heart: Regular rate and rhythm, no murmur, rub or gallop. Abdomen: Soft, non-tender,not distended. Bowel sounds normal. No masses Extremities: left knee surgical site covered with wound vac  Skin: No rashes or lesions. Or bruising Lymph: Cervical, supraclavicular normal. Neurologic: Grossly non-focal Pertinent Labs Lab Results CBC    Component Value Date/Time   WBC 7.5 06/12/2018 0546   RBC 4.72 06/12/2018 0546   HGB 14.3 06/12/2018 0546   HGB 13.9 01/06/2013 0424   HCT 43.4 06/12/2018 0546   HCT 39.4 (L) 01/06/2013 0424   PLT 278 06/12/2018 0546   PLT 254 01/06/2013 0424   MCV 91.9 06/12/2018 0546   MCV 91 01/06/2013 0424   MCH 30.3 06/12/2018 0546   MCHC 32.9 06/12/2018 0546   RDW 12.7 06/12/2018 0546   RDW 13.4 01/06/2013 0424   LYMPHSABS 1.3 06/10/2018 1536   LYMPHSABS 2.1 01/06/2013 0424   MONOABS 1.0 06/10/2018 1536   MONOABS 0.7 01/06/2013 0424   EOSABS 0.1 06/10/2018 1536   EOSABS 0.3 01/06/2013 0424   BASOSABS 0.0 06/10/2018 1536   BASOSABS 0.1 01/06/2013 0424    CMP Latest Ref Rng & Units 06/12/2018 06/11/2018 06/10/2018  Glucose 70 - 99 mg/dL 97 - 110(H)  BUN 6 - 20 mg/dL 11 - 14  Creatinine 0.61 - 1.24 mg/dL 0.70 0.90 0.88  Sodium 135 - 145 mmol/L 137 - 135  Potassium 3.5 - 5.1 mmol/L 3.4(L) - 3.9  Chloride 98 - 111 mmol/L 103 - 103  CO2 22 - 32 mmol/L 24 - 20(L)  Calcium 8.9 - 10.3 mg/dL 8.9 - 9.5  Total Protein 6.5 - 8.1 g/dL - - 8.7(H)  Total Bilirubin 0.3 - 1.2 mg/dL - - 1.5(H)  Alkaline Phos 38 - 126 U/L - - 68  AST 15 - 41 U/L - - 25  ALT 0 - 44 U/L - - 28      Microbiology: Recent Results (from the past 240 hour(s))  Blood culture (routine x 2)     Status: None (Preliminary result)   Collection Time: 06/10/18  7:01 PM  Result Value Ref Range Status   Specimen Description BLOOD LEFT HAND  Final    Special Requests   Final    BOTTLES DRAWN AEROBIC AND ANAEROBIC Blood Culture results may not be optimal due to an excessive volume of blood received in culture bottles   Culture   Final    NO GROWTH 2 DAYS Performed at [facility], [address] [place], [place] [address]    Report Status PENDING  Incomplete  Blood culture (routine x 2)     Status: None (Preliminary result)   Collection Time: 06/10/18  7:01 PM  Result Value Ref Range Status   Specimen Description BLOOD LEFT ANTECUBITAL  Final   Special Requests   Final    BOTTLES DRAWN AEROBIC AND ANAEROBIC Blood Culture results may not be optimal due to an excessive volume of blood received in culture bottles   Culture   Final    NO GROWTH 2 DAYS Performed at [facility], [address] [place], [place] [address]    Report Status PENDING  Incomplete  Aerobic/Anaerobic Culture (surgical/deep wound)     Status: None (Preliminary result)   Collection Time: 06/11/18  1:51 PM  Result Value Ref Range Status   Specimen Description   Final    FLUID LEFT KNEE JOINT Performed at [facility], [address], [place], [place] [address]    Special Requests   Final    NONE Performed at [facility], [address] [place], [place] [address]    Gram Stain   Final    ABUNDANT WBC PRESENT,BOTH PMN AND MONONUCLEAR NO ORGANISMS SEEN    Culture   Final    NO GROWTH < 24 HOURS Performed at [facility], [place] [address] [place], [place] [address]    Report Status PENDING  Incomplete  Aerobic/Anaerobic Culture (surgical/deep wound)     Status: None (Preliminary result)   Collection Time: 06/11/18  1:53 PM  Result Value Ref Range Status   Specimen Description   Final    FLUID LEFT KNEE JOINT Performed at [facility], [address] [place], [place] [address]    Special Requests   Final    NONE Performed at [facility], [address] [place], [place] [address]     Gram Stain   Final    ABUNDANT WBC PRESENT,BOTH PMN AND MONONUCLEAR NO ORGANISMS SEEN    Culture   Final    NO GROWTH < 24 HOURS Performed at [facility], [place] [address], [place], [place] [address]    Report Status PENDING  Incomplete  Aerobic/Anaerobic Culture (surgical/deep wound)     Status: None (Preliminary result)   Collection Time: 06/11/18  2:07 PM  Result Value Ref Range Status   Specimen Description   Final    TISSUE Performed at [facility], [address] [place], [place] [address]    Special Requests   Final    LEFT DEEP KNEE 1 C Performed at [facility], [address] [place], [place] [address]    Gram Stain   Final    ABUNDANT WBC PRESENT,BOTH PMN AND MONONUCLEAR NO ORGANISMS SEEN    Culture   Final    NO GROWTH < 24 HOURS Performed at [facility], [place] [address] [place], [place] [address]    Report Status PENDING  Incomplete  Aerobic/Anaerobic Culture (surgical/deep wound)     Status: None (Preliminary result)   Collection Time: 06/11/18  2:07 PM  Result Value Ref Range Status   Specimen Description   Final    TISSUE Performed at [facility], [address] [place], [place] [address]    Special Requests   Final    LEFT DEEP KNEE 2  D Performed at [facility], [address] [place], [place] [address]    Gram Stain   Final    ABUNDANT WBC PRESENT,BOTH PMN AND MONONUCLEAR NO ORGANISMS SEEN    Culture   Final    NO GROWTH < 24 HOURS NO ANAEROBES ISOLATED; CULTURE IN PROGRESS FOR 5 DAYS Performed at [facility], [place]  [address], [place], [place]  [address]    Report Status PENDING  Incomplete    IMAGING RESULTS: I have personally reviewed the films ? Impression/Recommendation 53 y.o. male with a history of with a history of bilateral total knee arthroplasty hypertension, hyperlipidemia and nonalcoholic fatty liver disease and is being treated for left knee PJI due to MSSA for the  past 6 months is admitted to the hospital with acute left knee pain of 2 days duration.  Left knee prosthetic joint infection due to Staph aureus ( MSSA) since Aug 2019 and on his 6th month of antibiotic _bactrim and rifampin followin gan I/D and poly exchnage in Aug 2019- ESR and CRP had normalized- he did not take the antibiotic for 2 days last week and the knee flared up again. He had another I/D and poly exchange yesterday  If this is recurrent MSSA infection and culture from current I/D grows MSSA - inspite of 6 months of appropriate antibiotic, it is not enough just to do a poly exchange and  the hardware will have to be removed in entirety as he has failed therapy and  we cannot eradicate the infection only with antibiotics.   Await culture report Continue vancomycin for now  Discussed with patient and Dr.Menz in detail.  HTN  on  chlorthalidone, amlodipine and benzepril  Hyperlipidemia- pravastatin wa son hold for the past 6 months due to him being on rifampin. Currently on Gemfibrozil ? ?Anxiety/depresison- on xanax, buspar, cymbalta and ambien ? ___________________________________________________ Discussed with patient, requesting provider

## 2018-06-12 NOTE — Progress Notes (Signed)
Clinical Education officer, museum (CSW) received SNF consult. PT is recommending per surgeon recommendations. RN case manager aware of above. Please reconsult if future social work needs arise. CSW signing off.   [employer], LCSW [phone]

## 2018-06-12 NOTE — Progress Notes (Signed)
   Subjective: 1 Day Post-Op Procedure(s) (LRB): INCISION AND DRAINAGE OF LEFT KNEE, POLY EXCHANGE (Left) Patient reports pain as moderate.   Patient is well, and has had no acute complaints or problems Denies any CP, SOB, ABD pain. We will start therapy today.   Objective: Vital signs in last 24 hours: Temp:  [97.3 F (36.3 C)-98.4 F (36.9 C)] 97.9 F (36.6 C) (02/11 0729) Pulse Rate:  [66-87] 71 (02/11 0729) Resp:  [12-19] 16 (02/11 0729) BP: (135-165)/(72-104) 147/92 (02/11 0729) SpO2:  [94 %-100 %] 99 % (02/11 0729)  Intake/Output from previous day: 02/10 0701 - 02/11 0700 In: 3458 [I.V.:2958; IV Piggyback:500] Out: 1450 [Urine:1400; Blood:50] Intake/Output this shift: No intake/output data recorded.  Recent Labs    06/10/18 1536 06/11/18 1635 06/12/18 0546  HGB 15.7 14.2 14.3   Recent Labs    06/11/18 1635 06/12/18 0546  WBC 8.7 7.5  RBC 4.70 4.72  HCT 42.9 43.4  PLT 251 278   Recent Labs    06/10/18 1536 06/11/18 1635 06/12/18 0546  NA 135  --  137  K 3.9  --  3.4*  CL 103  --  103  CO2 20*  --  24  BUN 14  --  11  CREATININE 0.88 0.90 0.70  GLUCOSE 110*  --  97  CALCIUM 9.5  --  8.9   No results for input(s): LABPT, INR in the last 72 hours.  EXAM General - Patient is Alert, Appropriate and Oriented Extremity - Neurovascular intact Sensation intact distally Intact pulses distally Dorsiflexion/Plantar flexion intact No cellulitis present Compartment soft  Mild knee effusion ROM 5-95 Dressing - dressing C/D/I and no drainage, prevena intact Motor Function - intact, moving foot and toes well on exam.   Past Medical History:  Diagnosis Date  . Anxiety   . Arthritis    knees, hands  . Bipolar disorder ([place])   . Chronic kidney disease    arf 2014 due to dehydration  . Headache   . Hemorrhoids   . Hyperlipidemia   . Hypertension   . LFT elevation   . NAFLD (nonalcoholic fatty liver disease)   . Sleep apnea     Assessment/Plan:    1 Day Post-Op Procedure(s) (LRB): INCISION AND DRAINAGE OF LEFT KNEE, POLY EXCHANGE (Left) Active Problems:   Infection of total left knee replacement (HCC)  Estimated body mass index is 40.87 kg/m as calculated from the following:   Height as of this encounter: 6\' 3"  (1.905 m).   Weight as of this encounter: 148.3 kg. Advance diet Up with therapy  Continue with IV abx Cultures pending    DVT Prophylaxis - Lovenox, Foot Pumps and TED hose Weight-Bearing as tolerated to Left leg   T. Rachelle Hora, PA-C [place] 06/12/2018, 10:09 AM

## 2018-06-12 NOTE — Anesthesia Postprocedure Evaluation (Signed)
Anesthesia Post Note  Patient: Nathan Chambers  Procedure(s) Performed: INCISION AND DRAINAGE OF LEFT KNEE, POLY EXCHANGE (Left Knee)  Patient location during evaluation: PACU Anesthesia Type: General Level of consciousness: awake and alert Pain management: pain level controlled Vital Signs Assessment: post-procedure vital signs reviewed and stable Respiratory status: spontaneous breathing, nonlabored ventilation, respiratory function stable and patient connected to nasal cannula oxygen Cardiovascular status: blood pressure returned to baseline and stable Postop Assessment: no apparent nausea or vomiting Anesthetic complications: no     Last Vitals:  Vitals:   06/11/18 2221 06/12/18 0402  BP: (!) 144/86 (!) 137/93  Pulse: 75 66  Resp: 18 19  Temp: 36.7 C 36.7 C  SpO2: 99% 100%    Last Pain:  Vitals:   06/12/18 0528  TempSrc:   PainSc: Asleep                 Precious Haws Natonya Finstad

## 2018-06-13 ENCOUNTER — Encounter: Payer: Self-pay | Admitting: Orthopedic Surgery

## 2018-06-13 NOTE — Progress Notes (Signed)
Physical Therapy Treatment Patient Details Name: Nathan Chambers MRN: [id number] DOB: 1965/08/21 Today's Date: 06/13/2018    History of Present Illness 53 y/o male who had b/l knee replacements in 2017, has had subsequent issues with L knee infections with I&D and now I&D with poly exchange.    PT Comments    Patient received in bed, premedicated prior to session. Patient reports tightness in left knee. Patient ambulated with RW 400 feet with supervision. Good pace, tolerance. Patient performed seated exercises in chair with cues. Patient will benefit from continued PT to improve ROM and strength in left LE for return to prior functional level.      Follow Up Recommendations  Home health PT     Equipment Recommendations  None recommended by PT    Recommendations for Other Services       Precautions / Restrictions Precautions Precautions: Fall Restrictions Weight Bearing Restrictions: Yes LLE Weight Bearing: Weight bearing as tolerated    Mobility  Bed Mobility Overal bed mobility: Independent                Transfers Overall transfer level: Modified independent Equipment used: Rolling walker (2 wheeled)             General transfer comment: supervision for safety  Ambulation/Gait Ambulation/Gait assistance: Supervision Gait Distance (Feet): 400 Feet Assistive device: Rolling walker (2 wheeled) Gait Pattern/deviations: Trunk flexed;Step-through pattern         Stairs             Wheelchair Mobility    Modified Rankin (Stroke Patients Only)       Balance Overall balance assessment: Modified Independent                                          Cognition Arousal/Alertness: Awake/alert Behavior During Therapy: WFL for tasks assessed/performed Overall Cognitive Status: Within Functional Limits for tasks assessed                                        Exercises Total Joint Exercises Ankle  Circles/Pumps: AROM;10 reps;Both;Seated Heel Slides: AROM;Left;Seated;10 reps Hip ABduction/ADduction: AROM;10 reps;Seated;Left Long Arc Quad: AAROM;10 reps;Left;Seated Marching in Standing: Seated;AROM;10 reps;Left    General Comments        Pertinent Vitals/Pain Pain Assessment: 0-10 Pain Score: 4  Pain Location: L knee Pain Descriptors / Indicators: Aching;Discomfort;Tightness;Sore Pain Intervention(s): Limited activity within patient's tolerance;Monitored during session;Premedicated before session    Home Living                      Prior Function            PT Goals (current goals can now be found in the care plan section) Acute Rehab PT Goals Patient Stated Goal: "Get this L knee working right again" PT Goal Formulation: With patient Time For Goal Achievement: 06/26/18 Potential to Achieve Goals: Good Progress towards PT goals: Progressing toward goals    Frequency    BID      PT Plan Current plan remains appropriate    Co-evaluation              AM-PAC PT "6 Clicks" Mobility   Outcome Measure  Help needed turning from your back to your side while in a flat bed without  using bedrails?: None Help needed moving from lying on your back to sitting on the side of a flat bed without using bedrails?: None Help needed moving to and from a bed to a chair (including a wheelchair)?: None Help needed standing up from a chair using your arms (e.g., wheelchair or bedside chair)?: None Help needed to walk in hospital room?: None Help needed climbing 3-5 steps with a railing? : None 6 Click Score: 24    End of Session Equipment Utilized During Treatment: Gait belt Activity Tolerance: Patient tolerated treatment well Patient left: in chair;with call bell/phone within reach;with family/visitor present Nurse Communication: Mobility status PT Visit Diagnosis: Muscle weakness (generalized) (M62.81);Pain Pain - Right/Left: Left Pain - part of body: Knee      Time: 1103-1594 PT Time Calculation (min) (ACUTE ONLY): 20 min  Charges:  $Therapeutic Exercise: 8-22 mins                     Psalm Arman, PT, GCS 06/13/18,11:09 AM

## 2018-06-13 NOTE — Progress Notes (Signed)
   Subjective: 2 Days Post-Op Procedure(s) (LRB): INCISION AND DRAINAGE OF LEFT KNEE, POLY EXCHANGE (Left) Patient reports pain as mild. Pain and swelling improving.   Patient is well, and has had no acute complaints or problems Denies any CP, SOB, ABD pain. We will continue with physical therapy today.   Objective: Vital signs in last 24 hours: Temp:  [97.8 F (36.6 C)-98.7 F (37.1 C)] 98.7 F (37.1 C) (02/12 0830) Pulse Rate:  [64-79] 70 (02/12 0830) Resp:  [18-19] 18 (02/12 0830) BP: (124-151)/(86-96) 151/96 (02/12 0830) SpO2:  [95 %-99 %] 95 % (02/12 0830)  Intake/Output from previous day: 02/11 0701 - 02/12 0700 In: 1710.9 [P.O.:240; I.V.:470.9; IV Piggyback:1000] Out: 1200 [Urine:1200] Intake/Output this shift: No intake/output data recorded.  Recent Labs    06/10/18 1536 06/11/18 1635 06/12/18 0546  HGB 15.7 14.2 14.3   Recent Labs    06/11/18 1635 06/12/18 0546  WBC 8.7 7.5  RBC 4.70 4.72  HCT 42.9 43.4  PLT 251 278   Recent Labs    06/10/18 1536 06/11/18 1635 06/12/18 0546  NA 135  --  137  K 3.9  --  3.4*  CL 103  --  103  CO2 20*  --  24  BUN 14  --  11  CREATININE 0.88 0.90 0.70  GLUCOSE 110*  --  97  CALCIUM 9.5  --  8.9   No results for input(s): LABPT, INR in the last 72 hours.  EXAM General - Patient is Alert, Appropriate and Oriented Extremity - Neurovascular intact Sensation intact distally Intact pulses distally Dorsiflexion/Plantar flexion intact No cellulitis present Compartment soft  Mild knee effusion ROM 5-95 Dressing - dressing C/D/I and no drainage, prevena intact Motor Function - intact, moving foot and toes well on exam.   Past Medical History:  Diagnosis Date  . Anxiety   . Arthritis    knees, hands  . Bipolar disorder ([place])   . Chronic kidney disease    arf 2014 due to dehydration  . Headache   . Hemorrhoids   . Hyperlipidemia   . Hypertension   . LFT elevation   . NAFLD (nonalcoholic fatty liver  disease)   . Sleep apnea     Assessment/Plan:   2 Days Post-Op Procedure(s) (LRB): INCISION AND DRAINAGE OF LEFT KNEE, POLY EXCHANGE (Left) Active Problems:   Infection of total left knee replacement (HCC)  Estimated body mass index is 40.87 kg/m as calculated from the following:   Height as of this encounter: 6\' 3"  (1.905 m).   Weight as of this encounter: 148.3 kg. Advance diet Up with therapy  Continue with IV abx Cultures pending. No growth in 2 days     DVT Prophylaxis - Lovenox, Foot Pumps and TED hose Weight-Bearing as tolerated to Left leg   T. Rachelle Hora, PA-C [place] 06/13/2018, 2:53 PM

## 2018-06-13 NOTE — Consult Note (Signed)
Pharmacy Antibiotic Note  Nathan Chambers is a 53 y.o. male admitted on 06/10/2018 with Wound infection.  Pharmacy has been consulted for vancomycin dosing.  Plan: Continue Vancomycin 1750 mg IV Q 12 hrs. Goal AUC 400-550. Expected AUC: 473.9 SCr used: 0.88  Patient ordered Vancomycin 1000 mg IV once as pre-op prophylaxis.  Will draw peak (1000) and Trough (1730) on 2/14 if plan is to continue Vancomycin, depending on deep tissue culture results   Height: 6\' 3"  (190.5 cm) Weight: (!) 327 lb (148.3 kg) IBW/kg (Calculated) : 84.5  Temp (24hrs), Avg:98.2 F (36.8 C), Min:97.8 F (36.6 C), Max:98.7 F (37.1 C)  Recent Labs  Lab 06/10/18 1536 06/11/18 1635 06/12/18 0546  WBC 11.1* 8.7 7.5  CREATININE 0.88 0.90 0.70    Estimated Creatinine Clearance: 164.2 mL/min (by C-G formula based on SCr of 0.7 mg/dL).    Allergies  Allergen Reactions  . Etodolac Itching    Antimicrobials this admission: Vancomycin 2/10 >>   Dose adjustments this admission:   Microbiology results: 2/9 BCx: pending, NGTD <12 hrs  Wound cultures: pending  Thank you for allowing pharmacy to be a part of this patient's care.  Lu Duffel, PharmD Clinical Pharmacist 06/13/2018 3:02 PM

## 2018-06-13 NOTE — Progress Notes (Signed)
   Date of Admission:  06/10/2018   Today 06/13/18   ID: Nathan Chambers is a 53 y.o. male  Active Problems:   Infection of total left knee replacement (HCC)    Subjective: Says pain left knee better Ambulating with PT in the corridor  Medications:  . amLODipine  10 mg Oral Daily   Or  . benazepril  40 mg Oral Daily  . busPIRone  5 mg Oral BID  . chlorthalidone  12.5 mg Oral Daily  . docusate sodium  100 mg Oral BID  . DULoxetine  60 mg Oral Daily  . enoxaparin (LOVENOX) injection  30 mg Subcutaneous Q12H  . folic acid  1 mg Oral Daily  . gemfibrozil  600 mg Oral BID AC  . multivitamin-lutein  1 capsule Oral Daily  . rOPINIRole  0.5 mg Oral QHS  . traMADol  50 mg Oral Q6H  . vitamin B-12  1,000 mcg Oral Daily  . vitamin C  500 mg Oral Daily    Objective: Vital signs in last 24 hours: Temp:  [98.2 F (36.8 C)-98.8 F (37.1 C)] 98.8 F (37.1 C) (02/12 1634) Pulse Rate:  [64-76] 76 (02/12 1634) Resp:  [18-20] 20 (02/12 1634) BP: (124-151)/(81-96) 139/81 (02/12 1634) SpO2:  [95 %-99 %] 99 % (02/12 1634)  PHYSICAL EXAM:  General: Alert, cooperative, no distress, appears stated age.  Ambulating with PT- using a walker Left knee has wound vac- no erythema or swelling  Lab Results Recent Labs    06/11/18 1635 06/12/18 0546  WBC 8.7 7.5  HGB 14.2 14.3  HCT 42.9 43.4  NA  --  137  K  --  3.4*  CL  --  103  CO2  --  24  BUN  --  11  CREATININE 0.90 0.70   Liver Panel No results for input(s): PROT, ALBUMIN, AST, ALT, ALKPHOS, BILITOT, BILIDIR, IBILI in the last 72 hours. Sedimentation Rate Recent Labs    06/10/18 1755  ESRSEDRATE 39*   C-Reactive Protein Recent Labs    06/10/18 1901  CRP 21.5*    Microbiology: Synovial tissue culture - NG so far Day 3 Studies/Results: No results found.   Assessment/Plan: 53 y.o. male with a history of with a history of bilateral total knee arthroplasty hypertension, hyperlipidemia and nonalcoholic fatty liver  disease and is being treated for left knee PJI due to MSSA for the past 6 months is admitted to the hospital with acute left knee pain of 2 days duration.  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 He had another I/D and poly exchange on 06/12/18 So far synovial tissue culture neg. Could he have had acute CPPD or Gouty arthritis IF culture remains negative on day 5 he will not need Iv antibiotic and could go on Bactrim and rifampin- IF culture turns positive will need IV  Continue vancomycin and add rifampin   HTN  on  chlorthalidone, amlodipine and benzepril  Hyperlipidemia- pravastatin wa son hold for the past 6 months due to him being on rifampin. Currently on Gemfibrozil ? ?Anxiety/depresison- on xanax, buspar, cymbalta and [place] Discussed with patient and Dr.Menz

## 2018-06-13 NOTE — Care Management (Signed)
Patient stated that he discussed [place] with his wife and he feels that he does not need it, he is declining [place] at this time, He has my contact information in the event of a change

## 2018-06-13 NOTE — Progress Notes (Signed)
Physical Therapy Treatment Patient Details Name: Nathan Chambers MRN: [id number] DOB: [birth year] Today's Date: 06/13/2018    History of Present Illness 53 y/o male who had b/l knee replacements in 2017, has had subsequent issues with L knee infections with I&D and now I&D with poly exchange.    PT Comments    Patient received in recliner, agrees to PT. Reports mod pain in left knee, feeling very tight. Patient increased ambulation to 600 feet this afternoon with rw and supervision. Cues for increased knee flexion with toe off and swing through phase. Seated LE strengthening and ROM exercises performed with min difficulty. Patient will benefit from continued skilled PT to improve ROM and strength of L LE to return to prior functional level.    Follow Up Recommendations  Home health PT     Equipment Recommendations  None recommended by PT    Recommendations for Other Services       Precautions / Restrictions Precautions Precautions: Fall Restrictions Weight Bearing Restrictions: Yes LLE Weight Bearing: Weight bearing as tolerated    Mobility  Bed Mobility Overal bed mobility: Independent             General bed mobility comments: remained in recliner post session  Transfers Overall transfer level: Modified independent Equipment used: Rolling walker (2 wheeled)             General transfer comment: supervision for safety  Ambulation/Gait Ambulation/Gait assistance: Supervision Gait Distance (Feet): 600 Feet Assistive device: Rolling walker (2 wheeled) Gait Pattern/deviations: Step-through pattern;Trunk flexed         Stairs             Wheelchair Mobility    Modified Rankin (Stroke Patients Only)       Balance Overall balance assessment: Modified Independent                                          Cognition Arousal/Alertness: Awake/alert Behavior During Therapy: WFL for tasks assessed/performed Overall Cognitive  Status: Within Functional Limits for tasks assessed                                        Exercises Total Joint Exercises Ankle Circles/Pumps: AROM;10 reps;Both Quad Sets: AROM;10 reps;Left Heel Slides: AROM;Left;Seated;10 reps Hip ABduction/ADduction: AROM;10 reps;Left Straight Leg Raises: AROM;Left;10 reps Long Arc Quad: AROM;Left;10 reps Knee Flexion: AROM;Left;10 reps Marching in Standing: Seated;AROM;10 reps;Left    General Comments        Pertinent Vitals/Pain Pain Assessment: 0-10 Pain Score: 8  Pain Location: L knee Pain Descriptors / Indicators: Aching;Discomfort;Tightness;Operative site guarding Pain Intervention(s): Limited activity within patient's tolerance;Monitored during session;Patient requesting pain meds-RN notified    Home Living                      Prior Function            PT Goals (current goals can now be found in the care plan section) Acute Rehab PT Goals Patient Stated Goal: "Get this L knee working right again" PT Goal Formulation: With patient Time For Goal Achievement: 06/26/18 Potential to Achieve Goals: Good Progress towards PT goals: Progressing toward goals    Frequency    BID      PT Plan Current plan remains appropriate  Co-evaluation              AM-PAC PT "6 Clicks" Mobility   Outcome Measure  Help needed turning from your back to your side while in a flat bed without using bedrails?: None Help needed moving from lying on your back to sitting on the side of a flat bed without using bedrails?: None Help needed moving to and from a bed to a chair (including a wheelchair)?: None Help needed standing up from a chair using your arms (e.g., wheelchair or bedside chair)?: None Help needed to walk in hospital room?: None Help needed climbing 3-5 steps with a railing? : None 6 Click Score: 24    End of Session Equipment Utilized During Treatment: Gait belt Activity Tolerance: Patient  tolerated treatment well Patient left: in chair;with call bell/phone within reach Nurse Communication: Mobility status PT Visit Diagnosis: Muscle weakness (generalized) (M62.81);Pain Pain - Right/Left: Left Pain - part of body: Knee     Time: 1335-1400 PT Time Calculation (min) (ACUTE ONLY): 25 min  Charges:  $Therapeutic Exercise: 8-22 mins                     [employer], PT, GCS 06/13/18,2:08 PM

## 2018-06-14 MED ORDER — RIFAMPIN 300 MG PO CAPS
300.0000 mg | ORAL_CAPSULE | Freq: Two times a day (BID) | ORAL | Status: DC
  Administered 2018-06-14 – 2018-06-15 (×3): 300 mg via ORAL
  Filled 2018-06-14 (×4): qty 1

## 2018-06-14 MED ORDER — HYDROCODONE-ACETAMINOPHEN 10-325 MG PO TABS
1.0000 | ORAL_TABLET | ORAL | Status: DC | PRN
  Administered 2018-06-14 – 2018-06-15 (×3): 1 via ORAL
  Administered 2018-06-15: 2 via ORAL
  Filled 2018-06-14 (×2): qty 1
  Filled 2018-06-14: qty 2
  Filled 2018-06-14: qty 1

## 2018-06-14 MED ORDER — POTASSIUM CHLORIDE CRYS ER 20 MEQ PO TBCR
40.0000 meq | EXTENDED_RELEASE_TABLET | Freq: Once | ORAL | Status: AC
  Administered 2018-06-14: 40 meq via ORAL
  Filled 2018-06-14 (×2): qty 2

## 2018-06-14 NOTE — Progress Notes (Signed)
   06/14/18 1600  Clinical Encounter Type  Visited With Patient  Visit Type Initial  Referral From Nurse   Chaplain visited with the patient upon recommendation from the nurse. Patient awake, listening to music, and sitting upright in chair. He was pleasant and welcoming. The patient shared about his love of making canes from tree limbs; his own beautiful cane was on the countertop. He also shared the history of his knee replacements and issues that have happened in the wake of those surgeries. During our brief conversation, a rehab specialist came to work with him. The chaplain will check back in with him at a later time.

## 2018-06-14 NOTE — Progress Notes (Signed)
Physical Therapy Treatment Patient Details Name: Nathan Chambers MRN: [id number] DOB: [birth year] Today's Date: 06/14/2018    History of Present Illness 53 y/o male who had b/l knee replacements in 2017, has had subsequent issues with L knee infections with I&D and now I&D with poly exchange.    PT Comments    Pt agreeable to PT; reports 8/10 pain in L knee, but notes this is a little more than his baseline pain. Pt demonstrates modified independence with all mobility. Ambulates well with good reciprocal pattern, no LOB and good attentions to safety. Pt educated regarding positioning of L knee, as found seated with BLEs dependent and LLE at approximately 60 degrees of flexion. Education to manage swelling throughout closed knee positioning, elevation and use of cryotherapy. Education/discussion on seated and stand exercises as well as progression to gym/possible aquatic program if needed in the future. Continue PT to progress range and strength with manageable pain in LLE to maximize function.    Follow Up Recommendations  Home health PT     Equipment Recommendations  None recommended by PT    Recommendations for Other Services       Precautions / Restrictions Precautions Precautions: Fall Restrictions Weight Bearing Restrictions: Yes LLE Weight Bearing: Weight bearing as tolerated    Mobility  Bed Mobility               General bed mobility comments: Not tested; up in chair  Transfers Overall transfer level: Modified independent Equipment used: Rolling walker (2 wheeled)             General transfer comment: Good use of hands/equipment  Ambulation/Gait Ambulation/Gait assistance: Modified independent (Device/Increase time) Gait Distance (Feet): 1000 Feet Assistive device: Rolling walker (2 wheeled) Gait Pattern/deviations: Step-through pattern Gait velocity: decreased Gait velocity interpretation: >2.62 ft/sec, indicative of community ambulatory      Stairs Stairs: (reports has already performed and comfortable)           Wheelchair Mobility    Modified Rankin (Stroke Patients Only)       Balance Overall balance assessment: Modified Independent                                          Cognition Arousal/Alertness: Awake/alert Behavior During Therapy: WFL for tasks assessed/performed Overall Cognitive Status: Within Functional Limits for tasks assessed                                        Exercises Total Joint Exercises Gluteal Sets: Strengthening;Left;10 reps;Standing;Seated(10 each position) Straight Leg Raises: Strengthening;Left;10 reps;Standing Long Arc Quad: AROM;Strengthening;Left;10 reps;Seated Knee Flexion: AROM;Left;10 reps;Seated(3-4 positions each rep with hold) Goniometric ROM: 4-80 Marching in Standing: Left;AROM;20 reps;Standing(also performs seated) Standing Hip Extension: Strengthening;Left;10 reps;Standing Other Exercises Other Exercises: discussion/education regarding program pt shares he does in the gym Other Exercises: education regarding positioning open/closed knee joint position for swelling management Other Exercises: Education regarding use of ice for swelling management    General Comments        Pertinent Vitals/Pain Pain Assessment: 0-10 Pain Score: 8  Pain Location: L knee(also notes chronic L lateral thigh burning pain) Pain Intervention(s): Monitored during session    Home Living  Prior Function            PT Goals (current goals can now be found in the care plan section) Progress towards PT goals: Progressing toward goals    Frequency    BID      PT Plan Current plan remains appropriate    Co-evaluation              AM-PAC PT "6 Clicks" Mobility   Outcome Measure  Help needed turning from your back to your side while in a flat bed without using bedrails?: None Help needed moving  from lying on your back to sitting on the side of a flat bed without using bedrails?: None Help needed moving to and from a bed to a chair (including a wheelchair)?: None Help needed standing up from a chair using your arms (e.g., wheelchair or bedside chair)?: None Help needed to walk in hospital room?: None Help needed climbing 3-5 steps with a railing? : None 6 Click Score: 24    End of Session Equipment Utilized During Treatment: Gait belt Activity Tolerance: Patient tolerated treatment well Patient left: in chair;with call bell/phone within reach;with family/visitor present   PT Visit Diagnosis: Muscle weakness (generalized) (M62.81);Pain Pain - Right/Left: Left Pain - part of body: Knee     Time: 0300-9233 PT Time Calculation (min) (ACUTE ONLY): 39 min  Charges:  $Gait Training: 8-22 mins $Therapeutic Exercise: 23-37 mins                      Larae Grooms, PTA 06/14/2018, 4:55 PM

## 2018-06-14 NOTE — Progress Notes (Signed)
   Subjective: 3 Days Post-Op Procedure(s) (LRB): INCISION AND DRAINAGE OF LEFT KNEE, POLY EXCHANGE (Left) Patient reports pain as mild. Pain and swelling improving.   Patient is well, and has had no acute complaints or problems Denies any CP, SOB, ABD pain. We will continue with physical therapy today.   Objective: Vital signs in last 24 hours: Temp:  [97.8 F (36.6 C)-98.8 F (37.1 C)] 97.8 F (36.6 C) (02/13 0825) Pulse Rate:  [61-76] 72 (02/13 0825) Resp:  [18-20] 20 (02/13 0825) BP: (110-140)/(76-98) 140/87 (02/13 0915) SpO2:  [97 %-99 %] 99 % (02/13 0825)  Intake/Output from previous day: 02/12 0701 - 02/13 0700 In: 240 [P.O.:240] Out: 400 [Urine:400] Intake/Output this shift: Total I/O In: 240 [P.O.:240] Out: 200 [Urine:200]  Recent Labs    06/11/18 1635 06/12/18 0546  HGB 14.2 14.3   Recent Labs    06/11/18 1635 06/12/18 0546  WBC 8.7 7.5  RBC 4.70 4.72  HCT 42.9 43.4  PLT 251 278   Recent Labs    06/11/18 1635 06/12/18 0546  NA  --  137  K  --  3.4*  CL  --  103  CO2  --  24  BUN  --  11  CREATININE 0.90 0.70  GLUCOSE  --  97  CALCIUM  --  8.9   No results for input(s): LABPT, INR in the last 72 hours.  EXAM General - Patient is Alert, Appropriate and Oriented Extremity - Neurovascular intact Sensation intact distally Intact pulses distally Dorsiflexion/Plantar flexion intact No cellulitis present Compartment soft  Mild knee effusion ROM 5-95 Dressing - dressing C/D/I and no drainage, prevena intact Motor Function - intact, moving foot and toes well on exam.   Past Medical History:  Diagnosis Date  . Anxiety   . Arthritis    knees, hands  . Bipolar disorder ([place])   . Chronic kidney disease    arf 2014 due to dehydration  . Headache   . Hemorrhoids   . Hyperlipidemia   . Hypertension   . LFT elevation   . NAFLD (nonalcoholic fatty liver disease)   . Sleep apnea     Assessment/Plan:   3 Days Post-Op Procedure(s)  (LRB): INCISION AND DRAINAGE OF LEFT KNEE, POLY EXCHANGE (Left) Active Problems:   Infection of total left knee replacement (HCC)  Estimated body mass index is 40.87 kg/m as calculated from the following:   Height as of this encounter: 6\' 3"  (1.905 m).   Weight as of this encounter: 148.3 kg. Advance diet Up with therapy  Continue with IV abx Cultures pending. No growth in 3 days.  Infectious disease recommendations noted.  If synovial cultures negative at 5 days we will continue with Bactrim and rifampin.  If positive will need IV antibiotics.    DVT Prophylaxis - Lovenox, Foot Pumps and TED hose Weight-Bearing as tolerated to Left leg   T. Rachelle Hora, PA-C [place] 06/14/2018, 12:34 PM

## 2018-06-14 NOTE — Progress Notes (Signed)
Physical Therapy Treatment Patient Details Name: Nathan Chambers MRN: [id number] DOB: December 21, 1965 Today's Date: 06/14/2018    History of Present Illness 53 y/o male who had b/l knee replacements in 2017, has had subsequent issues with L knee infections with I&D and now I&D with poly exchange.    PT Comments    Bed mobility and ambulation around unit x 2 with walker and modified independent.  Participated in exercises as described below.  Pt tolerating mobility well and is well versed in HEP and recovery expectations.   Follow Up Recommendations  Home health PT     Equipment Recommendations  None recommended by PT    Recommendations for Other Services       Precautions / Restrictions Precautions Precautions: Fall Restrictions Weight Bearing Restrictions: Yes LLE Weight Bearing: Weight bearing as tolerated    Mobility  Bed Mobility Overal bed mobility: Independent                Transfers Overall transfer level: Modified independent                  Ambulation/Gait Ambulation/Gait assistance: Modified independent (Device/Increase time) Gait Distance (Feet): 600 Feet Assistive device: Rolling walker (2 wheeled) Gait Pattern/deviations: Step-through pattern     General Gait Details: No LOB or buckling, good speen and confidence.   Stairs         General stair comments: declined, reports feeling comfortable    Wheelchair Mobility    Modified Rankin (Stroke Patients Only)       Balance Overall balance assessment: Modified Independent                                          Cognition Arousal/Alertness: Awake/alert Behavior During Therapy: WFL for tasks assessed/performed Overall Cognitive Status: Within Functional Limits for tasks assessed                                        Exercises Other Exercises Other Exercises: standing AROM for heel raises, SLR, marches, hamstring curls.  Pt knowedgeable in  HEP and reports doing on his own in bed and chair.      General Comments        Pertinent Vitals/Pain Pain Assessment: 0-10 Pain Score: 7  Pain Location: L knee - states 5/10 pain is baseline "I'm never without pain" Pain Descriptors / Indicators: Aching;Discomfort;Tightness;Operative site guarding Pain Intervention(s): Limited activity within patient's tolerance;Monitored during session;Premedicated before session    Home Living                      Prior Function            PT Goals (current goals can now be found in the care plan section) Progress towards PT goals: Progressing toward goals    Frequency    BID      PT Plan Current plan remains appropriate    Co-evaluation              AM-PAC PT "6 Clicks" Mobility   Outcome Measure  Help needed turning from your back to your side while in a flat bed without using bedrails?: None Help needed moving from lying on your back to sitting on the side of a flat bed without using bedrails?: None Help  needed moving to and from a bed to a chair (including a wheelchair)?: None Help needed standing up from a chair using your arms (e.g., wheelchair or bedside chair)?: None Help needed to walk in hospital room?: None Help needed climbing 3-5 steps with a railing? : None 6 Click Score: 24    End of Session Equipment Utilized During Treatment: Gait belt Activity Tolerance: Patient tolerated treatment well Patient left: in chair;with call bell/phone within reach   Pain - Right/Left: Left Pain - part of body: Knee     Time: 1165-7903 PT Time Calculation (min) (ACUTE ONLY): 14 min  Charges:  $Gait Training: 8-22 mins                     Chesley Noon, PTA 06/14/18, 11:23 AM

## 2018-06-15 LAB — CULTURE, BLOOD (ROUTINE X 2)
Culture: NO GROWTH
Culture: NO GROWTH

## 2018-06-15 LAB — BASIC METABOLIC PANEL
Anion gap: 6 (ref 5–15)
BUN: 7 mg/dL (ref 6–20)
CO2: 30 mmol/L (ref 22–32)
CREATININE: 0.78 mg/dL (ref 0.61–1.24)
Calcium: 8.8 mg/dL — ABNORMAL LOW (ref 8.9–10.3)
Chloride: 103 mmol/L (ref 98–111)
GFR calc Af Amer: >60 mL/min (ref >60)
GFR calc non Af Amer: >60 mL/min (ref >60)
Glucose, Bld: 107 mg/dL — ABNORMAL HIGH (ref 70–99)
Potassium: 3.4 mmol/L — ABNORMAL LOW (ref 3.5–5.1)
Sodium: 139 mmol/L (ref 135–145)

## 2018-06-15 MED ORDER — SULFAMETHOXAZOLE-TRIMETHOPRIM 800-160 MG PO TABS: 1.0000 | ORAL_TABLET | Freq: Two times a day (BID) | ORAL | Status: DC

## 2018-06-15 MED ORDER — ASPIRIN EC 325 MG PO TBEC: 325.0000 mg | DELAYED_RELEASE_TABLET | Freq: Every day | ORAL | 0 refills | Status: DC

## 2018-06-15 MED ORDER — HYDROCODONE-ACETAMINOPHEN 10-325 MG PO TABS: 1.0000 | ORAL_TABLET | ORAL | 0 refills | Status: DC | PRN

## 2018-06-15 NOTE — Progress Notes (Signed)
   Subjective: 4 Days Post-Op Procedure(s) (LRB): INCISION AND DRAINAGE OF LEFT KNEE, POLY EXCHANGE (Left) Patient reports pain as mild.    Patient is well, and has had no acute complaints or problems Denies any CP, SOB, ABD pain. We will continue with physical therapy today.   Objective: Vital signs in last 24 hours: Temp:  [97.8 F (36.6 C)-97.9 F (36.6 C)] 97.9 F (36.6 C) (02/13 2337) Pulse Rate:  [57-72] 57 (02/13 2337) Resp:  [18-20] 18 (02/13 2337) BP: (110-140)/(69-107) 118/69 (02/13 2337) SpO2:  [96 %-99 %] 96 % (02/13 2337)  Intake/Output from previous day: 02/13 0701 - 02/14 0700 In: 2079 [P.O.:480; IV Piggyback:1599] Out: 1100 [Urine:1100] Intake/Output this shift: No intake/output data recorded.  No results for input(s): HGB in the last 72 hours. No results for input(s): WBC, RBC, HCT, PLT in the last 72 hours. Recent Labs    06/15/18 0525  NA 139  K 3.4*  CL 103  CO2 30  BUN 7  CREATININE 0.78  GLUCOSE 107*  CALCIUM 8.8*   No results for input(s): LABPT, INR in the last 72 hours.  EXAM General - Patient is Alert, Appropriate and Oriented Extremity - Neurovascular intact Sensation intact distally Intact pulses distally Dorsiflexion/Plantar flexion intact No cellulitis present Compartment soft  Mild knee effusion Dressing - dressing C/D/I and no drainage, prevena intact. No drainage Motor Function - intact, moving foot and toes well on exam.   Past Medical History:  Diagnosis Date  . Anxiety   . Arthritis    knees, hands  . Bipolar disorder ([place])   . Chronic kidney disease    arf 2014 due to dehydration  . Headache   . Hemorrhoids   . Hyperlipidemia   . Hypertension   . LFT elevation   . NAFLD (nonalcoholic fatty liver disease)   . Sleep apnea     Assessment/Plan:   4 Days Post-Op Procedure(s) (LRB): INCISION AND DRAINAGE OF LEFT KNEE, POLY EXCHANGE (Left) Active Problems:   Infection of total left knee replacement  (HCC)  Estimated body mass index is 40.87 kg/m as calculated from the following:   Height as of this encounter: 6\' 3"  (1.905 m).   Weight as of this encounter: 148.3 kg. Advance diet Up with therapy  Continue with IV abx Cultures pending. No growth in 3 days.  Infectious disease recommendations noted.  If synovial cultures negative at 5 days we will continue with Bactrim and rifampin.  If positive will need IV antibiotics.    DVT Prophylaxis - Lovenox, Foot Pumps and TED hose Weight-Bearing as tolerated to Left leg   T. Rachelle Hora, PA-C [place] 06/15/2018, 7:17 AM

## 2018-06-15 NOTE — Progress Notes (Signed)
Physical Therapy Treatment Patient Details Name: Nathan Chambers MRN: [id number] DOB: [birth year] Today's Date: 06/15/2018    History of Present Illness 53 y/o male who had b/l knee replacements in 2017, has had subsequent issues with L knee infections with I&D and now I&D with poly exchange.    PT Comments    Pt continues to do well with mobility, ambulation and today is showing increased ability with exercises (did SLRs with light resistance and had >90 flexion).  Pt continues to have expected pain that is not functionally limiting and is able to tolerate full WBing w/o issue.  Pt ambulated ~1500 ft with good safety and confidence and did not have significant fatigue with the effort.   Follow Up Recommendations  Home health PT(pt not interested/feels confident about using the gym )     Equipment Recommendations       Recommendations for Other Services       Precautions / Restrictions Precautions Precautions: (mod fall risk) Restrictions Weight Bearing Restrictions: Yes LLE Weight Bearing: Weight bearing as tolerated    Mobility  Bed Mobility               General bed mobility comments: Not tested; up in chair  Transfers Overall transfer level: Modified independent Equipment used: Rolling walker (2 wheeled)                Ambulation/Gait Ambulation/Gait assistance: Modified independent (Device/Increase time) Gait Distance (Feet): 1500 Feet Assistive device: Rolling walker (2 wheeled)       General Gait Details: Pt continues to be very safe and confident with ambulation, showed good effort, minimal fatigue and generally did well   Stairs             Wheelchair Mobility    Modified Rankin (Stroke Patients Only)       Balance Overall balance assessment: Modified Independent                                          Cognition Arousal/Alertness: Awake/alert Behavior During Therapy: WFL for tasks assessed/performed Overall  Cognitive Status: Within Functional Limits for tasks assessed                                        Exercises Total Joint Exercises Quad Sets: Strengthening;15 reps Short Arc Quad: Strengthening;10 reps Heel Slides: Strengthening;10 reps Hip ABduction/ADduction: Strengthening;15 reps Straight Leg Raises: Strengthening;10 reps(Pt with good confidence with leg lifts, even w/ light resist) Knee Flexion: PROM;5 reps Goniometric ROM: 2-92    General Comments        Pertinent Vitals/Pain Pain Assessment: 0-10 Pain Score: 5     Home Living                      Prior Function            PT Goals (current goals can now be found in the care plan section) Progress towards PT goals: Progressing toward goals    Frequency    BID      PT Plan Current plan remains appropriate    Co-evaluation              AM-PAC PT "6 Clicks" Mobility   Outcome Measure  Help needed turning from your back to your side  while in a flat bed without using bedrails?: None Help needed moving from lying on your back to sitting on the side of a flat bed without using bedrails?: None Help needed moving to and from a bed to a chair (including a wheelchair)?: None Help needed standing up from a chair using your arms (e.g., wheelchair or bedside chair)?: None Help needed to walk in hospital room?: None Help needed climbing 3-5 steps with a railing? : None 6 Click Score: 24    End of Session Equipment Utilized During Treatment: Gait belt Activity Tolerance: Patient tolerated treatment well Patient left: in chair;with call bell/phone within reach;with family/visitor present   PT Visit Diagnosis: Muscle weakness (generalized) (M62.81);Pain Pain - Right/Left: Left Pain - part of body: Knee     Time: 0823-0905 PT Time Calculation (min) (ACUTE ONLY): 42 min  Charges:  $Gait Training: 8-22 mins $Therapeutic Exercise: 23-37 mins                     Kreg Shropshire,  DPT 06/15/2018, 11:03 AM

## 2018-06-15 NOTE — Discharge Summary (Addendum)
Physician Discharge Summary  Patient ID: Nathan Chambers MRN: [id number] DOB/AGE: Jun 02, 1965 53 y.o.  Admit date: 06/10/2018 Discharge date: 06/15/2018  Admission Diagnoses:  Acute pain of left knee [M25.562]   Discharge Diagnoses: Patient Active Problem List   Diagnosis Date Noted  . Infection of total left knee replacement ([place]) 06/10/2018  . Arthritis, septic, knee ([place]) 12/08/2017  . Hyperglycemia, unspecified 02/16/2016  . Anxiety 01/26/2015  . Hyperlipidemia 01/26/2015  . Hypertension 01/26/2015    Past Medical History:  Diagnosis Date  . Anxiety   . Arthritis    knees, hands  . Bipolar disorder ([place])   . Chronic kidney disease    arf 2014 due to dehydration  . Headache   . Hemorrhoids   . Hyperlipidemia   . Hypertension   . LFT elevation   . NAFLD (nonalcoholic fatty liver disease)   . Sleep apnea       Transfusion: None   Consultants (if any):   Discharged Condition: Improved  Hospital Course: Nathan Chambers is an 53 y.o. male who was admitted 06/10/2018 with a diagnosis of left total knee pain/effusion and went to the operating room on 06/11/2018 and underwent the above named procedures.    Surgeries: Procedure(s): INCISION AND DRAINAGE OF LEFT KNEE, POLY EXCHANGE on 06/11/2018 Patient tolerated the surgery well. Taken to PACU where she was stabilized and then transferred to the orthopedic floor.  Started on Lovenox 40 mg q 24 hrs. Foot pumps applied bilaterally at 80 mm. Heels elevated on bed with rolled towels. No evidence of DVT. Negative [place]. Physical therapy started on day #1 for gait training and transfer. OT started day #1 for ADL and assisted devices.  Patient was started on IV antibiotics.  Intraoperative cultures were negative through day 4.  Infectious disease consulted.  Will have patient continue with Bactrim and rifampin.  Patient will follow-up in 2 weeks for staple removal and Steri-Strip application.  Wound VAC will be removed on 06/22/2018  and honeycomb dressing applied.   He was given perioperative antibiotics:  Anti-infectives (From admission, onward)   Start     Dose/Rate Route Frequency Ordered Stop   06/15/18 1100  sulfamethoxazole-trimethoprim (BACTRIM DS,SEPTRA DS) 800-160 MG per tablet 1 tablet     1 tablet Oral Every 12 hours 06/15/18 1035     06/14/18 1230  rifampin (RIFADIN) capsule 300 mg     300 mg Oral Every 12 hours 06/14/18 1222     06/11/18 1800  vancomycin (VANCOCIN) 1,750 mg in sodium chloride 0.9 % 500 mL IVPB  Status:  Discontinued     1,750 mg 250 mL/hr over 120 Minutes Intravenous Every 12 hours 06/11/18 1551 06/15/18 1035   06/11/18 1415  vancomycin (VANCOCIN) 1-5 GM/200ML-% IVPB    Note to Pharmacy:  Norton Blizzard  : cabinet override      06/11/18 1415 06/12/18 0229   06/11/18 1400  vancomycin (VANCOCIN) IVPB 1000 mg/200 mL premix     1,000 mg 200 mL/hr over 60 Minutes Intravenous  Once 06/11/18 1333      .  He was given sequential compression devices, early ambulation, and aspirin, teds for DVT prophylaxis.  He benefited maximally from the hospital stay and there were no complications.    Recent vital signs:  Vitals:   06/14/18 2337 06/15/18 0815  BP: 118/69 133/87  Pulse: (!) 57 62  Resp: 18 18  Temp: 97.9 F (36.6 C) 98.8 F (37.1 C)  SpO2: 96% 100%    Recent  laboratory studies:  Lab Results  Component Value Date   HGB 14.3 06/12/2018   HGB 14.2 06/11/2018   HGB 15.7 06/10/2018   Lab Results  Component Value Date   WBC 7.5 06/12/2018   PLT 278 06/12/2018   Lab Results  Component Value Date   INR 1.06 04/06/2016   Lab Results  Component Value Date   NA 139 06/15/2018   K 3.4 (L) 06/15/2018   CL 103 06/15/2018   CO2 30 06/15/2018   BUN 7 06/15/2018   CREATININE 0.78 06/15/2018   GLUCOSE 107 (H) 06/15/2018    Discharge Medications:   Allergies as of 06/15/2018      Reactions   Etodolac Itching      Medication List    STOP taking these medications    enoxaparin 30 MG/0.3ML injection Commonly known as:  LOVENOX     TAKE these medications   ALPRAZolam 0.25 MG tablet Commonly known as:  XANAX Take 0.5-1 tablets by mouth every 12 (twelve) hours as needed for anxiety.   amLODipine-benazepril 10-40 MG capsule Commonly known as:  LOTREL Take 1 capsule by mouth daily.   aspirin EC 325 MG tablet Take 1 tablet (325 mg total) by mouth daily.   busPIRone 5 MG tablet Commonly known as:  BUSPAR Take 5 mg by mouth 2 (two) times daily.   chlorthalidone 25 MG tablet Commonly known as:  HYGROTON Take 12.5 mg by mouth daily.   DULoxetine 60 MG capsule Commonly known as:  CYMBALTA Take 60 mg by mouth daily.   folic acid 1 MG tablet Commonly known as:  FOLVITE Take 1 mg by mouth daily.   gemfibrozil 600 MG tablet Commonly known as:  LOPID Take 600 mg by mouth 2 (two) times daily before a meal.   HYDROcodone-acetaminophen 10-325 MG tablet Commonly known as:  NORCO Take 1-2 tablets by mouth every 4 (four) hours as needed for moderate pain.   meloxicam 15 MG tablet Commonly known as:  MOBIC Take 15 mg by mouth daily.   multivitamin-lutein Caps capsule Take 1 capsule by mouth daily.   naloxone 0.4 MG/ML injection Commonly known as:  NARCAN Inject 1 mL (0.4 mg total) into the vein as needed (respiratory rate < 10).   oxyCODONE 5 MG immediate release tablet Commonly known as:  Oxy IR/ROXICODONE Take 1-2 tablets (5-10 mg total) by mouth every 6 (six) hours as needed for moderate pain or severe pain.   rifampin 300 MG capsule Commonly known as:  RIFADIN Take 1 capsule (300 mg total) by mouth 2 (two) times daily.   rOPINIRole 0.5 MG tablet Commonly known as:  REQUIP Take 0.5 mg by mouth at bedtime.   sulfamethoxazole-trimethoprim 800-160 MG tablet Commonly known as:  BACTRIM DS,SEPTRA DS Take 1 tablet by mouth 2 (two) times daily.   vitamin B-12 1000 MCG tablet Commonly known as:  CYANOCOBALAMIN Take 1,000 mcg by mouth  daily.   vitamin C 500 MG tablet Commonly known as:  ASCORBIC ACID Take 500 mg by mouth daily.       Diagnostic Studies: Mr Lumbar Spine Wo Contrast  Result Date: 05/18/2018 CLINICAL DATA:  Chronic left low back pain with pain and numbness in the left leg. EXAM: MRI LUMBAR SPINE WITHOUT CONTRAST TECHNIQUE: Multiplanar, multisequence MR imaging of the lumbar spine was performed. No intravenous contrast was administered. COMPARISON:  None. FINDINGS: Segmentation:  Standard. Alignment:  Normal. Vertebrae: No fracture, suspicious osseous lesion, or significant marrow edema. Conus medullaris and cauda equina:  Conus extends to the L1-2 level. Conus and cauda equina appear normal. Paraspinal and other soft tissues: Unremarkable. Disc levels: Lower lumbar disc desiccation most notable at L5-S1. Preserved disc space heights. T12-L1: Diminutive right central disc protrusion and slight facet hypertrophy without stenosis. L1-2: Normal disc.  Mild facet hypertrophy without stenosis. L2-3: Minimal disc bulging, congenitally short pedicles, and mild to moderate facet hypertrophy result in mild spinal stenosis without neural foraminal stenosis. L3-4: Mild disc bulging, congenitally short pedicles, and moderate to severe facet hypertrophy result in mild spinal stenosis, mild right lateral recess stenosis, and mild right greater than left neural foraminal stenosis. L4-5: Mild disc bulging asymmetric to the left and moderate to severe facet hypertrophy result in mild bilateral neural foraminal stenosis and mild bilateral lateral recess stenosis without significant spinal stenosis. L5-S1: Moderate facet hypertrophy without disc herniation or stenosis. IMPRESSION: 1. Congenitally short pedicles with superimposed spondylosis and moderate to severe facet arthrosis. 2. Mild spinal stenosis at L2-3 and L3-4. 3. Mild neural foraminal stenosis at L3-4 and L4-5. Electronically Signed   By: Logan Bores M.D.   On: 05/18/2018 14:08    US Venous Img Lower Unilateral Left  Result Date: 06/10/2018 CLINICAL DATA:  Swelling and pain in LEFT leg for 2 days EXAM: LEFT LOWER EXTREMITY VENOUS DOPPLER ULTRASOUND TECHNIQUE: Gray-scale sonography with graded compression, as well as color Doppler and duplex ultrasound were performed to evaluate the lower extremity deep venous systems from the level of the common femoral vein and including the common femoral, femoral, profunda femoral, popliteal and calf veins including the posterior tibial, peroneal and gastrocnemius veins when visible. The superficial great saphenous vein was also interrogated. Spectral Doppler was utilized to evaluate flow at rest and with distal augmentation maneuvers in the common femoral, femoral and popliteal veins. COMPARISON:  None FINDINGS: Contralateral Common Femoral Vein: Respiratory phasicity is normal and symmetric with the symptomatic side. No evidence of thrombus. Normal compressibility. Common Femoral Vein: No evidence of thrombus. Normal compressibility, respiratory phasicity and response to augmentation. Saphenofemoral Junction: No evidence of thrombus. Normal compressibility and flow on color Doppler imaging. Profunda Femoral Vein: No evidence of thrombus. Normal compressibility and flow on color Doppler imaging. Femoral Vein: No evidence of thrombus. Normal compressibility, respiratory phasicity and response to augmentation. Popliteal Vein: No evidence of thrombus. Normal compressibility, respiratory phasicity and response to augmentation. Calf Veins: No evidence of thrombus. Normal compressibility and flow on color Doppler imaging. Superficial Great Saphenous Vein: No evidence of thrombus. Normal compressibility. Venous Reflux:  None. Other Findings:  None. IMPRESSION: No evidence of deep venous thrombosis in the LEFT lower extremity. Electronically Signed   By: Lavonia Dana M.D.   On: 06/10/2018 17:34   Dg Knee Complete 4 Views Left  Result Date:  06/10/2018 CLINICAL DATA:  LEFT leg pain and swelling, had a bacterial infection of the leg after an injury with a weedeater last summer EXAM: LEFT KNEE - COMPLETE 4+ VIEW COMPARISON:  12/08/2017 FINDINGS: Osseous demineralization. Components of a LEFT knee prosthesis are identified. No acute fracture or dislocation. Large joint effusion. Mild lucency is seen surrounding the stem of the tibial component, not significantly changed from postoperative images. Apparent fusion of the proximal tibiofibular joint. IMPRESSION: LEFT knee prosthesis without definite acute complication. Large knee joint effusion. Electronically Signed   By: Lavonia Dana M.D.   On: 06/10/2018 16:29    Disposition:     Follow-up Information    Tsosie Billing, MD.   Specialty:  Infectious Diseases Contact  information: [place] [address] [phone]        Duanne Guess, PA-C Follow up in 2 week(s).   Specialties:  Orthopedic Surgery, Emergency Medicine Contact information: [place] [address] [phone]            Signed: Feliberto Gottron 06/15/2018, 12:14 PM

## 2018-06-15 NOTE — Progress Notes (Addendum)
Patient is being discharged home with wife. Wound vac switched over to [place]. IV removed by [place], NT.  Allowed time for questions. Extra dressing sent with patient.

## 2018-06-15 NOTE — Discharge Instructions (Signed)

## 2018-06-15 NOTE — Consult Note (Signed)
Pharmacy Antibiotic Note  Nathan Chambers is a 53 y.o. male admitted on 06/10/2018 with Wound infection.  Left knee prosthetic joint infection due to Staph aureus ( MSSA) in 11/2017 and has received 6 months of antibiotic (and still on: bactrim and rifampin) following an I/D and poly exchange in 11/2017. He did not take the antibiotic for 2 days last week and the knee flared up again. He had another I/D and poly exchange on 06/12/18. ID is following. Pharmacy has been consulted for vancomycin dosing.  Plan: Continue Vancomycin 1750 mg IV Q 12 hrs. Vancomycin was given @ 0602 this AM. Canceled peak and trough for now. If vancomycin continues, will obtain peak with evening dose and trough with AM dose on 2/15. Cultures NG thus far.   Height: 6\' 3"  (190.5 cm) Weight: (!) 327 lb (148.3 kg) IBW/kg (Calculated) : 84.5  Temp (24hrs), Avg:97.8 F (36.6 C), Min:97.8 F (36.6 C), Max:97.9 F (36.6 C)  Recent Labs  Lab 06/10/18 1536 06/11/18 1635 06/12/18 0546 06/15/18 0525  WBC 11.1* 8.7 7.5  --   CREATININE 0.88 0.90 0.70 0.78    Estimated Creatinine Clearance: 164.2 mL/min (by C-G formula based on SCr of 0.78 mg/dL).    Allergies  Allergen Reactions  . Etodolac Itching    Antimicrobials this admission: Vancomycin 2/10 >>   Dose adjustments this admission: N/A  Microbiology results: 2/9 BCx: NGTD  2/10 Wound culture (tissue): NGTD 2/10 Fluid of L knee joint: NGTD  Thank you for allowing pharmacy to be a part of this patient's care.   Paticia Stack, PharmD Pharmacy Resident  06/15/2018 8:24 AM

## 2018-06-15 NOTE — Progress Notes (Signed)
   Date of Admission:  06/10/2018     ID: Nathan Chambers is a 53 y.o. male  Active Problems:   Infection of total left knee replacement (HCC)    Subjective: Feeling better Ambulating No fever  Medications:  . amLODipine  10 mg Oral Daily   Or  . benazepril  40 mg Oral Daily  . busPIRone  5 mg Oral BID  . chlorthalidone  12.5 mg Oral Daily  . docusate sodium  100 mg Oral BID  . DULoxetine  60 mg Oral Daily  . enoxaparin (LOVENOX) injection  30 mg Subcutaneous Q12H  . folic acid  1 mg Oral Daily  . gemfibrozil  600 mg Oral BID AC  . multivitamin-lutein  1 capsule Oral Daily  . rifampin  300 mg Oral Q12H  . rOPINIRole  0.5 mg Oral QHS  . traMADol  50 mg Oral Q6H  . vitamin B-12  1,000 mcg Oral Daily  . vitamin C  500 mg Oral Daily    Objective: Vital signs in last 24 hours: Temp:  [97.8 F (36.6 C)-98.8 F (37.1 C)] 98.8 F (37.1 C) (02/14 0815) Pulse Rate:  [57-68] 62 (02/14 0815) Resp:  [18] 18 (02/14 0815) BP: (118-134)/(69-107) 133/87 (02/14 0815) SpO2:  [96 %-100 %] 100 % (02/14 0815)  PHYSICAL EXAM:  General: Alert, cooperative, no distress, appears stated age.  Extremities:left knee-swollen- surgical site has wound vac Rt knee scar Skin: No rashes or lesions. Or bruising . Neurologic: Grossly non-focal  Lab Results Recent Labs    06/15/18 0525  NA 139  K 3.4*  CL 103  CO2 30  BUN 7  CREATININE 0.78    Microbiology: Synovial culture NG    Assessment/Plan: 53 y.o.malewith a history ofwith a history of bilateral total knee arthroplasty hypertension, hyperlipidemia and nonalcoholic fatty liver diseaseand is being treated for left knee PJI due to MSSA for the past 6 months is admitted to the hospital with acute left knee pain of 2 days duration.  Left knee prosthetic joint infection due to Staph aureus ( MSSA) in  Aug 2019 and has received  83monthof antibiotic and still on it  _bactrim and rifampin following an I/D and poly exchnage in Aug  2019- ESR and CRP had normalized- he did not take the antibiotic for 2 days last week and the knee flared up again. He had another I/D and poly exchange on 06/12/18 So far synovial tissue culture neg. Could he have had acute CPPD or Gouty arthritis? He is going home on  Bactrim and rifampin-     HTN on chlorthalidone, amlodipine and benzepril  Hyperlipidemia- pravastatin was on hold for the past 6 months due to him being on rifampin. Currently on Gemfibrozil ? ?Anxiety/depresison- on xanax, buspar, cymbalta and [place] Discussed with patient and wife Follow up as OP in 1 month

## 2018-06-17 LAB — AEROBIC/ANAEROBIC CULTURE W GRAM STAIN (SURGICAL/DEEP WOUND)
Culture: NO GROWTH
Culture: NO GROWTH
Culture: NO GROWTH
Culture: NO GROWTH

## 2018-06-17 LAB — AEROBIC/ANAEROBIC CULTURE (SURGICAL/DEEP WOUND)

## 2018-06-28 ENCOUNTER — Inpatient Hospital Stay: Payer: 59 | Admitting: Infectious Diseases

## 2018-07-04 DIAGNOSIS — G608 Other hereditary and idiopathic neuropathies: Secondary | ICD-10-CM | POA: Diagnosis not present

## 2018-07-04 DIAGNOSIS — R61 Generalized hyperhidrosis: Secondary | ICD-10-CM | POA: Diagnosis not present

## 2018-07-04 DIAGNOSIS — R7309 Other abnormal glucose: Secondary | ICD-10-CM | POA: Diagnosis not present

## 2018-07-04 DIAGNOSIS — R5382 Chronic fatigue, unspecified: Secondary | ICD-10-CM | POA: Diagnosis not present

## 2018-07-11 ENCOUNTER — Other Ambulatory Visit: Payer: Self-pay | Admitting: Licensed Clinical Social Worker

## 2018-07-11 DIAGNOSIS — M00062 Staphylococcal arthritis, left knee: Secondary | ICD-10-CM

## 2018-07-11 MED ORDER — RIFAMPIN 300 MG PO CAPS: 300.0000 mg | ORAL_CAPSULE | Freq: Two times a day (BID) | ORAL | 0 refills | Status: DC

## 2018-07-11 MED ORDER — SULFAMETHOXAZOLE-TRIMETHOPRIM 800-160 MG PO TABS: 1.0000 | ORAL_TABLET | Freq: Two times a day (BID) | ORAL | 0 refills | Status: DC

## 2018-08-09 ENCOUNTER — Other Ambulatory Visit
Admission: RE | Admit: 2018-08-09 | Discharge: 2018-08-09 | Disposition: A | Discharge status: Home / Self Care | Payer: 59 | Source: Ambulatory Visit | Attending: Infectious Diseases | Admitting: Infectious Diseases

## 2018-08-09 ENCOUNTER — Ambulatory Visit: Payer: 59 | Attending: Infectious Diseases | Admitting: Infectious Diseases

## 2018-08-09 ENCOUNTER — Other Ambulatory Visit: Payer: Self-pay | Admitting: Infectious Diseases

## 2018-08-09 ENCOUNTER — Encounter: Payer: Self-pay | Admitting: Infectious Diseases

## 2018-08-09 ENCOUNTER — Other Ambulatory Visit: Payer: Self-pay

## 2018-08-09 VITALS — BP 150/81 | HR 83 | Temp 98.0°F | Resp 18

## 2018-08-09 DIAGNOSIS — T8454XD Infection and inflammatory reaction due to internal left knee prosthesis, subsequent encounter: Secondary | ICD-10-CM | POA: Diagnosis not present

## 2018-08-09 DIAGNOSIS — K760 Fatty (change of) liver, not elsewhere classified: Secondary | ICD-10-CM

## 2018-08-09 DIAGNOSIS — M00062 Staphylococcal arthritis, left knee: Secondary | ICD-10-CM

## 2018-08-09 DIAGNOSIS — B9561 Methicillin susceptible Staphylococcus aureus infection as the cause of diseases classified elsewhere: Secondary | ICD-10-CM

## 2018-08-09 DIAGNOSIS — Z792 Long term (current) use of antibiotics: Secondary | ICD-10-CM

## 2018-08-09 DIAGNOSIS — Z888 Allergy status to other drugs, medicaments and biological substances status: Secondary | ICD-10-CM

## 2018-08-09 DIAGNOSIS — Z87891 Personal history of nicotine dependence: Secondary | ICD-10-CM

## 2018-08-09 DIAGNOSIS — Z96653 Presence of artificial knee joint, bilateral: Secondary | ICD-10-CM

## 2018-08-09 DIAGNOSIS — I10 Essential (primary) hypertension: Secondary | ICD-10-CM

## 2018-08-09 DIAGNOSIS — E785 Hyperlipidemia, unspecified: Secondary | ICD-10-CM

## 2018-08-09 LAB — CBC WITH DIFFERENTIAL/PLATELET
Abs Immature Granulocytes: 0.03 10*3/uL (ref 0.00–0.07)
Basophils Absolute: 0.1 10*3/uL (ref 0.0–0.1)
Basophils Relative: 1 %
Eosinophils Absolute: 0.5 10*3/uL (ref 0.0–0.5)
Eosinophils Relative: 6 %
HCT: 44 % (ref 39.0–52.0)
Hemoglobin: 15.1 g/dL (ref 13.0–17.0)
Immature Granulocytes: 0 %
Lymphocytes Relative: 22 %
Lymphs Abs: 1.7 10*3/uL (ref 0.7–4.0)
MCH: 30.4 pg (ref 26.0–34.0)
MCHC: 34.3 g/dL (ref 30.0–36.0)
MCV: 88.5 fL (ref 80.0–100.0)
Monocytes Absolute: 0.7 10*3/uL (ref 0.1–1.0)
Monocytes Relative: 8 %
Neutro Abs: 5 10*3/uL (ref 1.7–7.7)
Neutrophils Relative %: 63 %
Platelets: 286 10*3/uL (ref 150–400)
RBC: 4.97 MIL/uL (ref 4.22–5.81)
RDW: 12.2 % (ref 11.5–15.5)
WBC: 8 10*3/uL (ref 4.0–10.5)
nRBC: 0 % (ref 0.0–0.2)

## 2018-08-09 LAB — COMPREHENSIVE METABOLIC PANEL
ALT: 39 U/L (ref 0–44)
AST: 32 U/L (ref 15–41)
Albumin: 4.6 g/dL (ref 3.5–5.0)
Alkaline Phosphatase: 75 U/L (ref 38–126)
Anion gap: 12 (ref 5–15)
BUN: 13 mg/dL (ref 6–20)
CO2: 24 mmol/L (ref 22–32)
Calcium: 9.6 mg/dL (ref 8.9–10.3)
Chloride: 102 mmol/L (ref 98–111)
Creatinine, Ser: 0.72 mg/dL (ref 0.61–1.24)
GFR calc Af Amer: >60 mL/min (ref >60)
GFR calc non Af Amer: >60 mL/min (ref >60)
Glucose, Bld: 117 mg/dL — ABNORMAL HIGH (ref 70–99)
Potassium: 3.9 mmol/L (ref 3.5–5.1)
Sodium: 138 mmol/L (ref 135–145)
Total Bilirubin: 0.5 mg/dL (ref 0.3–1.2)
Total Protein: 7.9 g/dL (ref 6.5–8.1)

## 2018-08-09 LAB — SEDIMENTATION RATE: Sed Rate: 15 mm/hr (ref 0–20)

## 2018-08-09 LAB — C-REACTIVE PROTEIN: CRP: 2.4 mg/dL — ABNORMAL HIGH (ref <1.0)

## 2018-08-09 MED ORDER — SULFAMETHOXAZOLE-TRIMETHOPRIM 800-160 MG PO TABS: 1.0000 | ORAL_TABLET | Freq: Two times a day (BID) | ORAL | 5 refills | Status: DC

## 2018-08-09 MED ORDER — RIFAMPIN 300 MG PO CAPS: 300.0000 mg | ORAL_CAPSULE | Freq: Two times a day (BID) | ORAL | 5 refills | Status: DC

## 2018-08-09 MED ORDER — RISAQUAD PO CAPS: 1.0000 | ORAL_CAPSULE | Freq: Every day | ORAL | 5 refills | Status: DC

## 2018-08-09 NOTE — Progress Notes (Signed)
NAME: Nathan Chambers  DOB: 1966-03-02  MRN: [id number]  Date/Time: 08/09/2018 11:36 AM    Follow up visit  ? Nathan Chambers is a 53 y.o. with a history of bilateral total knee arthroplasty hypertension, hyperlipidemia and nonalcoholic fatty liver disease and is being treated for left knee PJI due to MSSA since aug 2019 is here for follow up. He is on Bactrim and rifampin and he was running out of refills and couldn't reach the clinic by phone and hence came to the hospital.  He was recently in [facility] in Feb for a few days with worsening left knee swelling and had I/D  His history is  as follows He had B/l knee replacement on 04/19/2016 by Dr.Menz. was doing okay until Aug 2019. Pt was in [facility] between 12/08/17 and 12/12/17 for left knee pain and swelling following an injury he sustained by weed mover to the left calf.  Patient had left knee arthrotomy with irrigation and debridement and polyethylene exchange and placement of antibiotic impregnated bio composite beeds on 12/08/2017 by Dr. Marry Guan. . The synovial culture came back as staph aureusand he was treated with 6 weeks of  IV cefazolin and PO rifampin. After the completion of IV he has been on Bactrim and rifampin orally for the past 5 months. He was followed by me as Op and his last labs from 05/09/18 showed CRP of 1 and ESR of 8 . According to patient he did not take bactrim and rifampin for 2 days and on 06/08/18 he collected his prescription and took both his meds. That night he developed sudden onset left knee pain and could not walk. He lay in bed the whole of Saturday and Sunday and as it was getting worse he came to the ED on Sunday afternoon. He had Doppler of the left leg and DVT was ruled out. WBC was 11, CRP 21.5 and ESR 39. He was seen by Dr.Menz and taken for  I/D of left knee with polyexchange and tissue culture was sent. As per his note "There appeared to be purulent material within the joint and 2 cultures obtained of the synovial  fluid" The culture was negative and There was a suspicion for CPPD flare up but no crystals sent form the surgical material. He was sent home on Bactrim and rifampin on 06/15/18.   He has been doing okay since discharged- the swelling is much reduced, he had PT, he has been walking with a cane. Has some stiffness and pain. No fever. NO diarrhea or rash  Past Medical History:  Diagnosis Date  . Anxiety   . Arthritis    knees, hands  . Bipolar disorder ([place])   . Chronic kidney disease    arf 2014 due to dehydration  . Headache   . Hemorrhoids   . Hyperlipidemia   . Hypertension   . LFT elevation   . NAFLD (nonalcoholic fatty liver disease)   . Sleep apnea     Past Surgical History:  Procedure Laterality Date  . ACHILLES TENDON SURGERY Right 10/30/2015   Procedure: ACHILLES TENDON REPAIR/ HEGLAND OSTECTOMY;  Surgeon: [name], DPM;  Location: [facility] ORS;  Service: Podiatry;  Laterality: Right;  . COLONOSCOPY    . COLONOSCOPY WITH PROPOFOL N/A 04/07/2017   Procedure: COLONOSCOPY WITH PROPOFOL;  Surgeon: Toledo, Benay Pike, MD;  Location: [facility] ENDOSCOPY;  Service: Gastroenterology;  Laterality: N/A;  . FRACTURE SURGERY Left    MIDDLE FINGER  . HERNIA REPAIR    .  I&D KNEE WITH POLY EXCHANGE Left 12/08/2017   Procedure: IRRIGATION AND DEBRIDEMENT KNEE WITH POLY EXCHANGE;  Surgeon: Dereck Leep, MD;  Location: [facility] ORS;  Service: Orthopedics;  Laterality: Left;  . JOINT REPLACEMENT    . KNEE ARTHROTOMY Left 12/08/2017   Procedure: KNEE ARTHROTOMY, I&D, [place] OUT, POLY EXCHANGE;  Surgeon: Dereck Leep, MD;  Location: [facility] ORS;  Service: Orthopedics;  Laterality: Left;  . LIPOMA EXCISION    . TOTAL KNEE ARTHROPLASTY Bilateral 04/19/2016   Procedure: TOTAL KNEE BILATERAL;  Surgeon: Hessie Knows, MD;  Location: [facility] ORS;  Service: Orthopedics;  Laterality: Bilateral;  . TOTAL KNEE REVISION Left 06/11/2018   Procedure: INCISION AND DRAINAGE OF LEFT KNEE, POLY EXCHANGE;  Surgeon: Hessie Knows, MD;  Location: [facility] ORS;  Service: Orthopedics;  Laterality: Left;  Marland Kitchen VASECTOMY      Social History   Socioeconomic History  . Marital status: Married    Spouse name: Not on file  . Number of children: Not on file  . Years of education: Not on file  . Highest education level: Not on file  Occupational History  . Not on file  Social Needs  . Financial resource strain: Somewhat hard  . Food insecurity:    Worry: Never true    Inability: Never true  . Transportation needs:    Medical: Patient refused    Non-medical: Patient refused  Tobacco Use  . Smoking status: Never Smoker  . Smokeless tobacco: Former Systems developer    Types: Chew  Substance and Sexual Activity  . Alcohol use: No  . Drug use: No  . Sexual activity: Yes  Lifestyle  . Physical activity:    Days per week: 1 day    Minutes per session: 30 min  . Stress: To some extent  Relationships  . Social connections:    Talks on phone: More than three times a week    Gets together: More than three times a week    Attends religious service: Never    Active member of club or organization: No    Attends meetings of clubs or organizations: Never    Relationship status: Married  . Intimate partner violence:    Fear of current or ex partner: Patient refused    Emotionally abused: Patient refused    Physically abused: Patient refused    Forced sexual activity: Patient refused  Other Topics Concern  . Not on file  Social History Narrative  . Not on file    No family history on file. Allergies  Allergen Reactions  . Etodolac Itching   ID  Recent  Procedure Surgery Injections Trauma Sick contacts Travel Antibiotic use Food- raw/exotic Steroid/immune suppressants/splenectomy/Hardware Animal bites Tick exposure Water sports Fishing/hunting/animal bird exposure ? Current Outpatient Medications  Medication Sig Dispense Refill  . acidophilus (RISAQUAD) CAPS capsule Take 1 capsule by mouth daily. 30 capsule  5  . ALPRAZolam (XANAX) 0.25 MG tablet Take 0.5-1 tablets by mouth every 12 (twelve) hours as needed for anxiety.  0  . amLODipine-benazepril (LOTREL) 10-40 MG capsule Take 1 capsule by mouth daily.    Marland Kitchen aspirin EC 325 MG tablet Take 1 tablet (325 mg total) by mouth daily. 30 tablet 0  . busPIRone (BUSPAR) 5 MG tablet Take 5 mg by mouth 2 (two) times daily.    . chlorthalidone (HYGROTON) 25 MG tablet Take 12.5 mg by mouth daily.    . DULoxetine (CYMBALTA) 60 MG capsule Take 60 mg by mouth daily.    Marland Kitchen  folic acid (FOLVITE) 1 MG tablet Take 1 mg by mouth daily.     Marland Kitchen gemfibrozil (LOPID) 600 MG tablet Take 600 mg by mouth 2 (two) times daily before a meal.    . HYDROcodone-acetaminophen (NORCO) 10-325 MG tablet Take 1-2 tablets by mouth every 4 (four) hours as needed for moderate pain. 30 tablet 0  . meloxicam (MOBIC) 15 MG tablet Take 15 mg by mouth daily.    . multivitamin-lutein (OCUVITE-LUTEIN) CAPS capsule Take 1 capsule by mouth daily.    . naloxone (NARCAN) 0.4 MG/ML injection Inject 1 mL (0.4 mg total) into the vein as needed (respiratory rate < 10). 1 mL 0  . oxyCODONE (OXY IR/ROXICODONE) 5 MG immediate release tablet Take 1-2 tablets (5-10 mg total) by mouth every 6 (six) hours as needed for moderate pain or severe pain. 25 tablet 0  . rifampin (RIFADIN) 300 MG capsule Take 1 capsule (300 mg total) by mouth 2 (two) times daily. 60 capsule 5  . rOPINIRole (REQUIP) 0.5 MG tablet Take 0.5 mg by mouth at bedtime.  0  . sulfamethoxazole-trimethoprim (BACTRIM DS,SEPTRA DS) 800-160 MG tablet Take 1 tablet by mouth 2 (two) times daily. 60 tablet 5  . vitamin B-12 (CYANOCOBALAMIN) 1000 MCG tablet Take 1,000 mcg by mouth daily.     . vitamin C (ASCORBIC ACID) 500 MG tablet Take 500 mg by mouth daily.     No current facility-administered medications for this visit.      Abtx:  Anti-infectives (From admission, onward)   None      REVIEW OF SYSTEMS:  Const: negative fever, negative chills,  negative weight loss Eyes: negative diplopia or visual changes, negative eye pain ENT: negative coryza, negative sore throat Resp: negative cough, hemoptysis, dyspnea Cards: negative for chest pain, palpitations, lower extremity edema GU: negative for frequency, dysuria and hematuria GI: Negative for abdominal pain, diarrhea, bleeding, constipation Skin: negative for rash and pruritus Heme: negative for easy bruising and gum/nose bleeding MS:as above Neurolo:negative for headaches, dizziness, vertigo, memory problems  Psych: negative for feelings of anxiety, depression  Endocrine: negative for thyroid, diabetes Allergy/Immunology- negative for any medication or food allergies ? Objective:  VITALS:  BP (!) 150/81   Pulse 83   Temp 98 F (36.7 C)   Resp 18   SpO2 93%  PHYSICAL EXAM:  General: Alert, cooperative, no distress, appears stated age.  Head: Normocephalic, without obvious abnormality, atraumatic. Eyes: Conjunctivae clear, anicteric sclerae. Pupils are equal ENT Nares normal. No drainage or sinus tenderness. Lips, mucosa, and tongue normal. No Thrush Neck: Supple, symmetrical, no adenopathy, thyroid: non tender no carotid bruit and no JVD. Back: No CVA tenderness. Lungs: Clear to auscultation bilaterally. No Wheezing or Rhonchi. No rales. Heart: Regular rate and rhythm, no murmur, rub or gallop. Abdomen: Soft, non-tender,not distended. Bowel sounds normal. No masses Extremities: b/l Knee scars/ left knee is not warm , some residual bony swelling Skin: No rashes or lesions. Or bruising Lymph: Cervical, supraclavicular normal. Neurologic: Grossly non-focal Pertinent Labs None recently Impression/Recommendation ? ?Left knee prosthetic joint infection with MSSA in Aug 2019 , had I/D and exchange  of polyethylene capsule then and was on antibiotics bactrim and rifampin following cefazolin IV . In fevb had acute knee joint swelling and pain when he missed 2 days of  rifampin and bactrim. Was readmitted and had I/D and exchange but culture was negative. He is back on bactrim and rifampin and doing well There was a question whether that was a  CCPD flare up but no crystal examination was done. Will continue the current antibiotic combination for 6 months and then decide on de-escalating. As he has hardware still in place it is very likely that he will be on antibiotic indefinitely ? ___________________________________________________ Discussed with patient in great detail.

## 2018-08-09 NOTE — Addendum Note (Signed)
Addended by: Santiago Bur on: 08/09/2018 11:30 AM   Modules accepted: Orders

## 2018-08-23 NOTE — Progress Notes (Signed)
Patient's Name: Nathan Chambers  MRN: [id number]  Referring Provider: Vladimir Crofts, MD  DOB: 01-Mar-1966  PCP: Nathan Pitch, MD  DOS: 08/28/2018  Note by: Nathan Santa, MD  Service setting: Ambulatory outpatient  Specialty: Interventional Pain Management  Location: [facility] Virtual Visit  Visit type: Initial Patient Evaluation  Patient type: New Patient   Pain Management Virtual Encounter Note - Virtual Visit via [place] (real-time audio visits between healthcare provider and patient).  Patient's Phone No.:  [phone] (home); [phone] (mobile); (Preferred) [phone] No e-mail address on record  [place], [place] [address] [address] Phone: [phone] Fax: [place] [id number] Nathan Chambers, [place] [address] Phone: [phone] Fax: [phone]   Pre-screening note:  Our staff contacted Nathan Chambers and offered him an "in person", "face-to-face" appointment versus a telephone encounter. He indicated preferring the telephone encounter, at this time.  Primary Reason(s) for Visit: Tele-Encounter for initial evaluation of one or more chronic problems (new to examiner) potentially causing chronic pain, and posing a threat to normal musculoskeletal function. (Level of risk: High) CC: Leg Pain (left leg mainly, outer portion of legs ) and Knee Pain (bilateral )  I contacted Nathan Chambers on 08/28/2018 at 12:38 PM via video conference and clearly identified myself as Nathan Santa, MD. I verified that I was speaking with the correct person using two identifiers (Name and date of birth: November 03, 1965).  Advanced Informed Consent I sought verbal advanced consent from Nathan Chambers for virtual visit interactions. I informed Nathan Chambers of possible security and privacy concerns, risks, and limitations  associated with providing "not-in-person" medical evaluation and management services. I also informed Nathan Chambers of the availability of "in-person" appointments. Finally, I informed him that there would be a charge for the virtual visit and that he could be  personally, fully or partially, financially responsible for it. Nathan Chambers expressed understanding and agreed to proceed.   HPI  Nathan Chambers is a 53 y.o. year old, male patient, contacted today for an initial evaluation of his chronic pain. He has Arthritis, septic, knee (Nathan Chambers); Anxiety; Hyperglycemia, unspecified; Hyperlipidemia; Hypertension; Infection of total left knee replacement (Nathan Chambers); Chronic radicular lumbar pain; History of bilateral knee replacement; Lumbar radicular pain; Lumbar facet arthropathy; Spinal stenosis of lumbar region without neurogenic claudication; Lumbar spondylosis; Neuroforaminal stenosis of lumbar spine; and Sensory polyneuropathy on their problem list.  Pain Assessment: Location: Left, Right Leg Radiating: peripheral neuropathy Onset: More than a month ago Duration: Chronic pain Quality: Tender, Radiating, Discomfort, Constant, Penetrating Severity: 10-Worst pain ever/10 (subjective, self-reported pain score)  Effect on ADL: patient mainly just stays in bed, goes to bathroom or to kitchen for hydration and food and then back to bed.  Timing: Constant Modifying factors: lying down helps by relieving pressure.  takes multiple medications but states they don't really work   Onset and Duration: Gradual, Date of onset: 2016 and Present longer than 3 months Cause of pain: Surgery Severity: Getting worse, NAS-11 at its worse: 10/10, NAS-11 at its best: 10/10, NAS-11 now: 10/10 and NAS-11 on the average: 10/10 Timing: Not influenced by the time of the day Aggravating Factors: Surgery made it worse and Walking Alleviating Factors: Lying down and Medications Associated Problems: Fatigue, Swelling and Temperature  changes Quality of Pain: Aching, Constant, Deep,  Distressing, Getting longer, Superficial, Tender and Uncomfortable Previous Examinations or Tests: MRI scan, Nerve conduction test, Neurological evaluation and Orthopedic evaluation Previous Treatments: Narcotic medications  53 year old male with a history of bilateral carpal tunnel, right ulnar neuropathy, sensory polyneuropathy in lower extremities as well as left-sided lumbar radiculopathy with radiating pain intermittent weakness.  Patient is being referred from the neurology clinic.  There he is being managed on Cymbalta 60 mg daily and he was started on 50 mg of amitriptyline at night.  Lab work has been ordered which shows elevated CRP but his ESR has decreased.  I encouraged the patient to discuss this with the ordering provider.  Patient states that he has been having significant difficulty getting out of bed.  He is status post left knee total arthrotomy, irrigation debridement with poly-at the lead exchange and placement of antibiotic impregnated beads on 06/11/2018.  He had his staples removed on 06/28/2018.  He is continuing to ambulate with a cane states that he is not very mobile.  Medication management per PCP.  Of note patient would not be an opioid candidate at this clinic given concomitant intake of Xanax.    We will focus on interventional pain management.   The patient was informed that my practice is divided into two sections: an interventional pain management section, as well as a completely separate and distinct medication management section. I explained that I have procedure days for my interventional therapies, and evaluation days for follow-ups and medication management. Because of the amount of documentation required during both, they are kept separated. This means that there is the possibility that he may be scheduled for a procedure on one day, and medication management the next. I have also informed him that because of staffing  and facility limitations, I no longer take patients for medication management only. To illustrate the reasons for this, I gave the patient the example of surgeons, and how inappropriate it would be to refer a patient to his/her care, just to write for the post-surgical antibiotics on a surgery done by a different surgeon.   Because interventional pain management is my board-certified specialty, the patient was informed that joining my practice means that they are open to any and all interventional therapies. I made it clear that this does not mean that they will be forced to have any procedures done. What this means is that I believe interventional therapies to be essential part of the diagnosis and proper management of chronic pain conditions. Therefore, patients not interested in these interventional alternatives will be better served under the care of a different practitioner.  The patient was also made aware of my Comprehensive Pain Management Safety Guidelines where by joining my practice, they limit all of their nerve blocks and joint injections to those done by our practice, for as long as we are retained to manage their care.   Historic Controlled Substance Pharmacotherapy Review   08/13/2018  1   08/13/2018  Oxycodone Hcl 5 MG Tablet  40.00 5 Da Bro   [id number]   [facility] ([id number])   0  60.00 MME  Comm Ins   [place]    Medications: Bottles not available for inspection. Pharmacodynamics: Desired effects: Analgesia: The patient reports >50% benefit. Reported improvement in function: The patient reports medication allows him to accomplish basic ADLs. Clinically meaningful improvement in function (CMIF): Sustained CMIF goals met Perceived effectiveness: Described as relatively effective, allowing for increase in activities of daily living (ADL) Undesirable effects: Side-effects or Adverse reactions:  None reported Historical Monitoring: The patient  reports no history of drug use. List of all UDS  Test(s): No results found for: MDMA, COCAINSCRNUR, [place], [place], CANNABQUANT, THCU, [place] List of other Serum/Urine Drug Screening Test(s):  No results found for: AMPHSCRSER, BARBSCRSER, BENZOSCRSER, COCAINSCRSER, COCAINSCRNUR, PCPSCRSER, PCPQUANT, THCSCRSER, THCU, CANNABQUANT, OPIATESCRSER, OXYSCRSER, PROPOXSCRSER, ETH Historical Background Evaluation: [place] PMP: PDMP reviewed during this encounter. Six (6) year initial data search conducted.             [place] Department of public safety, offender search: Editor, commissioning Information) Non-contributory Risk Assessment Profile: Aberrant behavior: None observed or detected today Risk factors for fatal opioid overdose: Benzodiazepine use and concomitant use of Benzodiazepines Fatal overdose hazard ratio (HR): Calculation deferred Non-fatal overdose hazard ratio (HR): Calculation deferred Risk of opioid abuse or dependence: 0.7-3.0% with doses ? 36 MME/day and 6.1-26% with doses ? 120 MME/day. Substance use disorder (SUD) risk level: See below Personal History of Substance Abuse (SUD-Substance use disorder):  Alcohol: Negative  Illegal Drugs: Negative  Rx Drugs: Negative  ORT Risk Level calculation: Low Risk Opioid Risk Tool - 08/27/18 1326      Family History of Substance Abuse   Alcohol  Negative    Illegal Drugs  Negative    Rx Drugs  Negative      Personal History of Substance Abuse   Alcohol  Negative    Illegal Drugs  Negative    Rx Drugs  Negative      Age   Age between 64-45 years   No      Psychological Disease   Psychological Disease  Negative    Depression  Negative      Total Score   Opioid Risk Tool Scoring  0    Opioid Risk Interpretation  Low Risk      ORT Scoring interpretation table:  Score <3 = Low Risk for SUD  Score between 4-7 = Moderate Risk for SUD  Score >8 = High Risk for Opioid Abuse   Pharmacologic Plan: None at this point.  Focus on interventional pain management.  Not a candidate for chronic opioid  therapy given concomitant intake of Xanax.            Initial impression: interventional pain management only.  Meds   Current Outpatient Medications:  .  acidophilus (RISAQUAD) CAPS capsule, Take 1 capsule by mouth daily., Disp: 30 capsule, Rfl: 5 .  ALPRAZolam (XANAX) 0.25 MG tablet, Take 0.5-1 tablets by mouth every 12 (twelve) hours as needed for anxiety., Disp: , Rfl: 0 .  amLODipine-benazepril (LOTREL) 10-40 MG capsule, Take 1 capsule by mouth daily., Disp: , Rfl:  .  aspirin EC 81 MG tablet, Take 81 mg by mouth daily., Disp: , Rfl:  .  busPIRone (BUSPAR) 5 MG tablet, Take 5 mg by mouth 2 (two) times daily., Disp: , Rfl:  .  chlorthalidone (HYGROTON) 25 MG tablet, Take 12.5 mg by mouth daily., Disp: , Rfl:  .  folic acid (FOLVITE) 1 MG tablet, Take 1 mg by mouth daily. , Disp: , Rfl:  .  gemfibrozil (LOPID) 600 MG tablet, Take 600 mg by mouth 2 (two) times daily before a meal., Disp: , Rfl:  .  meloxicam (MOBIC) 15 MG tablet, Take 15 mg by mouth daily., Disp: , Rfl:  .  multivitamin-lutein (OCUVITE-LUTEIN) CAPS capsule, Take 1 capsule by mouth daily., Disp: , Rfl:  .  naloxone (NARCAN) 0.4 MG/ML injection, Inject 1 mL (0.4 mg total) into the vein as needed (  respiratory rate < 10)., Disp: 1 mL, Rfl: 0 .  oxyCODONE (OXY IR/ROXICODONE) 5 MG immediate release tablet, Take 1-2 tablets (5-10 mg total) by mouth every 6 (six) hours as needed for moderate pain or severe pain., Disp: 25 tablet, Rfl: 0 .  rifampin (RIFADIN) 300 MG capsule, Take 1 capsule (300 mg total) by mouth 2 (two) times daily., Disp: 60 capsule, Rfl: 5 .  sulfamethoxazole-trimethoprim (BACTRIM DS,SEPTRA DS) 800-160 MG tablet, Take 1 tablet by mouth 2 (two) times daily., Disp: 60 tablet, Rfl: 5 .  vitamin B-12 (CYANOCOBALAMIN) 1000 MCG tablet, Take 1,000 mcg by mouth daily. , Disp: , Rfl:  .  vitamin C (ASCORBIC ACID) 500 MG tablet, Take 500 mg by mouth daily., Disp: , Rfl:  .  DULoxetine (CYMBALTA) 60 MG capsule, Take 60 mg by  mouth daily., Disp: , Rfl:  .  rOPINIRole (REQUIP) 0.5 MG tablet, Take 0.5 mg by mouth at bedtime., Disp: , Rfl: 0  ROS  Cardiovascular: No reported cardiovascular signs or symptoms such as High blood pressure, coronary artery disease, abnormal heart rate or rhythm, heart attack, blood thinner therapy or heart weakness and/or failure Pulmonary or Respiratory: No reported pulmonary signs or symptoms such as wheezing and difficulty taking a deep full breath (Asthma), difficulty blowing air out (Emphysema), coughing up mucus (Bronchitis), persistent dry cough, or temporary stoppage of breathing during sleep Neurological: Abnormal skin sensations (Peripheral Neuropathy) Review of Past Neurological Studies: No results found for this or any previous visit. Psychological-Psychiatric: Anxiousness and Difficulty sleeping and or falling asleep Gastrointestinal: No reported gastrointestinal signs or symptoms such as vomiting or evacuating blood, reflux, heartburn, alternating episodes of diarrhea and constipation, inflamed or scarred liver, or pancreas or irrregular and/or infrequent bowel movements Genitourinary: No reported renal or genitourinary signs or symptoms such as difficulty voiding or producing urine, peeing blood, non-functioning kidney, kidney stones, difficulty emptying the bladder, difficulty controlling the flow of urine, or chronic kidney disease Hematological: No reported hematological signs or symptoms such as prolonged bleeding, low or poor functioning platelets, bruising or bleeding easily, hereditary bleeding problems, low energy levels due to low hemoglobin or being anemic Endocrine: No reported endocrine signs or symptoms such as high or low blood sugar, rapid heart rate due to high thyroid levels, obesity or weight gain due to slow thyroid or thyroid disease Rheumatologic: No reported rheumatological signs and symptoms such as fatigue, joint pain, tenderness, swelling, redness, heat,  stiffness, decreased range of motion, with or without associated rash Musculoskeletal: Negative for myasthenia gravis, muscular dystrophy, multiple sclerosis or malignant hyperthermia Work History: Out of work due to pain  Allergies  Mr. Nghiem is allergic to etodolac.  Laboratory Chemistry  Inflammation Markers (CRP: Acute Phase) (ESR: Chronic Phase) Lab Results  Component Value Date   CRP 2.4 (H) 08/09/2018   ESRSEDRATE 15 08/09/2018                         Rheumatology Markers No results found for: RF, ANA, LABURIC, URICUR, LYMEIGGIGMAB, LYMEABIGMQN, HLAB27                      Renal Function Markers Lab Results  Component Value Date   BUN 13 08/09/2018   CREATININE 0.72 08/09/2018   GFRAA >60 08/09/2018   GFRNONAA >60 08/09/2018  Hepatic Function Markers Lab Results  Component Value Date   AST 32 08/09/2018   ALT 39 08/09/2018   ALBUMIN 4.6 08/09/2018   ALKPHOS 75 08/09/2018                        Electrolytes Lab Results  Component Value Date   NA 138 08/09/2018   K 3.9 08/09/2018   CL 102 08/09/2018   CALCIUM 9.6 08/09/2018   MG 2.4 01/06/2013                        Neuropathy Markers Lab Results  Component Value Date   HGBA1C 6.0 01/06/2013   HIV Non Reactive 12/09/2017                        CNS Tests No results found for: COLORCSF, APPEARCSF, RBCCOUNTCSF, WBCCSF, POLYSCSF, LYMPHSCSF, EOSCSF, PROTEINCSF, GLUCCSF, JCVIRUS, CSFOLI, IGGCSF, LABACHR, ACETBL                      Bone Pathology Markers No results found for: VD25OH, [id number], [id number], [id number], 25OHVITD1, 25OHVITD2, 25OHVITD3, TESTOFREE, TESTOSTERONE                       Coagulation Parameters Lab Results  Component Value Date   INR 1.06 04/06/2016   LABPROT 13.8 04/06/2016   APTT 31 04/06/2016   PLT 286 08/09/2018                        Cardiovascular Markers Lab Results  Component Value Date   CKTOTAL 237 (H) 01/07/2013   TROPONINI <0.03  12/08/2017   HGB 15.1 08/09/2018   HCT 44.0 08/09/2018                         ID Markers Lab Results  Component Value Date   HIV Non Reactive 12/09/2017                        CA Markers No results found for: CEA, CA125, LABCA2                      Endocrine Markers Lab Results  Component Value Date   TSH 0.89 01/06/2013                        Note: Lab results reviewed.  Imaging Review    Lumbosacral Imaging: Lumbar MR wo contrast:  Results for orders placed during the hospital encounter of 05/18/18  MR LUMBAR SPINE WO CONTRAST   Narrative CLINICAL DATA:  Chronic left low back pain with pain and numbness in the left leg.  EXAM: MRI LUMBAR SPINE WITHOUT CONTRAST  TECHNIQUE: Multiplanar, multisequence MR imaging of the lumbar spine was performed. No intravenous contrast was administered.  COMPARISON:  None.  FINDINGS: Segmentation:  Standard.  Alignment:  Normal.  Vertebrae: No fracture, suspicious osseous lesion, or significant marrow edema.  Conus medullaris and cauda equina: Conus extends to the L1-2 level. Conus and cauda equina appear normal.  Paraspinal and other soft tissues: Unremarkable.  Disc levels:  Lower lumbar disc desiccation most notable at L5-S1. Preserved disc space heights.  T12-L1: Diminutive right central disc protrusion and slight facet hypertrophy without stenosis.  L1-2: Normal disc.  Mild facet hypertrophy without stenosis.  L2-3: Minimal disc bulging, congenitally short pedicles, and mild to moderate facet hypertrophy result in mild spinal stenosis without neural foraminal stenosis.  L3-4: Mild disc bulging, congenitally short pedicles, and moderate to severe facet hypertrophy result in mild spinal stenosis, mild right lateral recess stenosis, and mild right greater than left neural foraminal stenosis.  L4-5: Mild disc bulging asymmetric to the left and moderate to severe facet hypertrophy result in mild bilateral  neural foraminal stenosis and mild bilateral lateral recess stenosis without significant spinal stenosis.  L5-S1: Moderate facet hypertrophy without disc herniation or stenosis.  IMPRESSION: 1. Congenitally short pedicles with superimposed spondylosis and moderate to severe facet arthrosis. 2. Mild spinal stenosis at L2-3 and L3-4. 3. Mild neural foraminal stenosis at L3-4 and L4-5.   Electronically Signed   By: Logan Bores M.D.   On: 05/18/2018 14:08     Knee-R CT wo contrast:  Results for orders placed during the hospital encounter of 03/03/16  CT KNEE RIGHT WO CONTRAST   Narrative CLINICAL DATA:  Severe bilateral knee pain and swelling for years  EXAM: CT OF THE RIGHT KNEE WITHOUT CONTRAST  TECHNIQUE: Multidetector CT imaging of the RIGHT knee was performed according to the standard protocol. Multiplanar CT image reconstructions were also generated.  COMPARISON:  None.  FINDINGS: Bones/Joint/Cartilage  The hip demonstrates no fracture or dislocation. There is no lytic or blastic lesion. There is ankylosis of the right sacroiliac joint with mild osteoarthritis of the inferior right sacroiliac joint.  The knee demonstrates no fracture or dislocation. There is no lytic or blastic lesion. There is severe osteoarthritis of the medial femorotibial compartment with severe joint space narrowing with a bone-on-bone appearance, subchondral sclerosis and marginal osteophytosis. There is mild osteoarthritis of the lateral femorotibial compartment. There is severe osteoarthritis of the patellofemoral compartment with joint space narrowing and bulky osteophytes. There is a small joint effusion. There are multiple loose bodies in the posterior joint space.  The ankle demonstrates no fracture or dislocation. There is no lytic or blastic lesion. There is mild tibiotalar osteoarthritis.  Ligaments  Suboptimally assessed by CT.  Muscles and Tendons  The muscles are  normal.  No significant muscle atrophy.  Soft tissues  No fluid collection or hematoma.  IMPRESSION: 1. Severe patellofemoral compartment and medial femorotibial compartment osteoarthritis of the right knee. Mild osteoarthritis of the lateral femorotibial compartment.   Electronically Signed   By: Kathreen Devoid   On: 03/03/2016 12:02    Knee-L CT wo contrast:  Results for orders placed during the hospital encounter of 03/03/16  CT KNEE LEFT WO CONTRAST   Narrative CLINICAL DATA:  Preop for total knee arthroplasty.  MY Knee planning  EXAM: CT OF THE right KNEE WITHOUT CONTRAST  TECHNIQUE: Multidetector CT imaging of the right knee was performed according to the standard protocol. Multiplanar CT image reconstructions were also generated.  COMPARISON:  None.  FINDINGS: Bones/Joint/Cartilage  Right hip: Axial images demonstrate SI joint degenerative changes and partial fusion anteriorly. Mild hip joint degenerative changes but no fracture or AVN.  Right knee: Advanced tricompartmental degenerative changes with joint space narrowing, osteophytic spurring, subchondral cystic change and bony eburnation. This is most significant in the medial compartment  No acute bony findings or osteochondral lesion. Small joint effusion. Ossified loose body is noted in the posterior joint space.  Right ankle:  Mild to moderate degenerative changes but no obvious fracture or osteochondral lesion.  IMPRESSION: Advanced tricompartmental degenerative changes involving the right knee as described  above.   Electronically Signed   By: Marijo Sanes M.D.   On: 03/03/2016 12:04    Knee-R DG 1-2 views:  Results for orders placed during the hospital encounter of 04/19/16  DG Knee 1-2 Views Right   Narrative CLINICAL DATA:  Status post total knee replacement  EXAM: RIGHT KNEE - 1-2 VIEW  COMPARISON:  Right knee CT March 03, 2016  FINDINGS: Frontal and lateral views were  obtained. The patient is status post total knee arthroplasty with femoral and tibial prosthetic components appearing well-seated. No acute fracture or dislocation. Air within the joint is an expected postoperative finding. There is spurring along the mediolateral aspects of the distal femur and proximal tibia.  IMPRESSION: Total knee replacement prosthetic components appear well seated. No acute fracture or dislocation.   Electronically Signed   By: Lowella Grip III M.D.   On: 04/19/2016 13:25    Knee-L DG 1-2 views:  Results for orders placed during the hospital encounter of 04/19/16  DG Knee 1-2 Views Left   Narrative CLINICAL DATA:  Status post total knee replacement  EXAM: LEFT KNEE - 1-2 VIEW  COMPARISON:  Left knee CT March 03, 2016  FINDINGS: Frontal and lateral views were obtained it. The patient is status post total knee arthroplasty with femoral and tibial prosthetic components appearing well seated. No acute fracture or dislocation evident. Air within the joint is an expected postoperative finding. There is spurring along the patella.  IMPRESSION: Prosthetic components appear well seated. No acute fracture or dislocation. Arthropathy with spurring in the patellar region.   Electronically Signed   By: Lowella Grip III M.D.   On: 04/19/2016 13:24     Knee-L DG 4 views:  Results for orders placed during the hospital encounter of 06/10/18  DG Knee Complete 4 Views Left   Narrative CLINICAL DATA:  LEFT leg pain and swelling, had a bacterial infection of the leg after an injury with a weedeater last summer  EXAM: LEFT KNEE - COMPLETE 4+ VIEW  COMPARISON:  12/08/2017  FINDINGS: Osseous demineralization.  Components of a LEFT knee prosthesis are identified.  No acute fracture or dislocation.  Large joint effusion.  Mild lucency is seen surrounding the stem of the tibial component, not significantly changed from postoperative  images.  Apparent fusion of the proximal tibiofibular joint.  IMPRESSION: LEFT knee prosthesis without definite acute complication.  Large knee joint effusion.   Electronically Signed   By: Lavonia Dana M.D.   On: 06/10/2018 16:29     Complexity Note: Imaging results reviewed. Results shared with Mr. Avitabile, using Layman's terms.                         [place]  Drug: Mr. Rebert  reports no history of drug use. Alcohol:  reports no history of alcohol use. Tobacco:  reports that he has never smoked. He quit smokeless tobacco use about 6 years ago.  His smokeless tobacco use included chew. Medical:  has a past medical history of Anxiety, Arthritis, Bipolar disorder ([place]), Chronic kidney disease, Headache, Hemorrhoids, Hyperlipidemia, Hypertension, LFT elevation, NAFLD (nonalcoholic fatty liver disease), and Sleep apnea. Family: family history is not on file.  Past Surgical History:  Procedure Laterality Date  . ACHILLES TENDON SURGERY Right 10/30/2015   Procedure: ACHILLES TENDON REPAIR/ HEGLAND OSTECTOMY;  Surgeon: [name], DPM;  Location: [facility] ORS;  Service: Podiatry;  Laterality: Right;  . COLONOSCOPY    . COLONOSCOPY WITH  PROPOFOL N/A 04/07/2017   Procedure: COLONOSCOPY WITH PROPOFOL;  Surgeon: Toledo, Benay Pike, MD;  Location: [facility];  Service: Gastroenterology;  Laterality: N/A;  . FRACTURE SURGERY Left    MIDDLE FINGER  . HERNIA REPAIR    . I&D KNEE WITH POLY EXCHANGE Left 12/08/2017   Procedure: IRRIGATION AND DEBRIDEMENT KNEE WITH POLY EXCHANGE;  Surgeon: Dereck Leep, MD;  Location: [facility] ORS;  Service: Orthopedics;  Laterality: Left;  . JOINT REPLACEMENT    . KNEE ARTHROTOMY Left 12/08/2017   Procedure: KNEE ARTHROTOMY, I&D, [place] OUT, POLY EXCHANGE;  Surgeon: Dereck Leep, MD;  Location: [facility] ORS;  Service: Orthopedics;  Laterality: Left;  . LIPOMA EXCISION    . TOTAL KNEE ARTHROPLASTY Bilateral 04/19/2016   Procedure: TOTAL KNEE BILATERAL;  Surgeon:  Hessie Knows, MD;  Location: [facility] ORS;  Service: Orthopedics;  Laterality: Bilateral;  . TOTAL KNEE REVISION Left 06/11/2018   Procedure: INCISION AND DRAINAGE OF LEFT KNEE, POLY EXCHANGE;  Surgeon: Hessie Knows, MD;  Location: [facility] OR[facility];  Service: Orthopedics;  Laterality: Left;  Marland Kitchen VASECTOMY     Active Ambulatory Problems    Diagnosis Date Noted  . Arthritis, septic, knee ([place]) 12/08/2017  . Anxiety 01/26/2015  . Hyperglycemia, unspecified 02/16/2016  . Hyperlipidemia 01/26/2015  . Hypertension 01/26/2015  . Infection of total left knee replacement ([place]) 06/10/2018  . Chronic radicular lumbar pain 08/28/2018  . History of bilateral knee replacement 08/28/2018  . Lumbar radicular pain 08/28/2018  . Lumbar facet arthropathy 08/28/2018  . Spinal stenosis of lumbar region without neurogenic claudication 08/28/2018  . Lumbar spondylosis 08/28/2018  . Neuroforaminal stenosis of lumbar spine 08/28/2018  . Sensory polyneuropathy 08/28/2018   Resolved Ambulatory Problems    Diagnosis Date Noted  . No Resolved Ambulatory Problems   Past Medical History:  Diagnosis Date  . Arthritis   . Bipolar disorder ([place])   . Chronic kidney disease   . Headache   . Hemorrhoids   . LFT elevation   . NAFLD (nonalcoholic fatty liver disease)   . Sleep apnea    Assessment  Primary Diagnosis & Pertinent Problem List: The primary encounter diagnosis was Chronic radicular lumbar pain. Diagnoses of Lumbar radicular pain, History of bilateral knee replacement, Infection of total left knee replacement, subsequent encounter, Lumbar facet arthropathy, Lumbar spondylosis, Spinal stenosis of lumbar region without neurogenic claudication, Neuroforaminal stenosis of lumbar spine, and Sensory polyneuropathy were also pertinent to this visit.  Visit Diagnosis (New problems to examiner): 1. Chronic radicular lumbar pain   2. Lumbar radicular pain   3. History of bilateral knee replacement   4. Infection of total  left knee replacement, subsequent encounter   5. Lumbar facet arthropathy   6. Lumbar spondylosis   7. Spinal stenosis of lumbar region without neurogenic claudication   8. Neuroforaminal stenosis of lumbar spine   9. Sensory polyneuropathy    Chronic lumbar radicular pain: Patient's lumbar MRI shows mild spinal stenosis at L2-L3 and L3-L4 along with mild neuroforaminal stenosis at L3-L4 and L4-L5.  Patient primarily complains of left low back pain that radiates into his left leg as well as bilateral foot pain.  Discussed lumbar epidural steroid injection at L4-L5.  Risks and benefits reviewed.  Patient would like to proceed.  Patient takes 81 mg aspirin which he can continue.  Future considerations could include thoracolumbar spinal cord stimulation.  We will wait to discuss this with patient until we have tried other less invasive interventional therapies.  Lumbar facet arthropathy/lumbar  spondylosis: Patient's lumbar MRI also reveals severe facet arthrosis at L3, L4, L5, S1.  Interestingly the patient does not endorse low back pain is his primary pain generator.  He describes leg pain and bilateral foot pain probably related to his sensory polyneuropathy.  Future considerations for this could include diagnostic lumbar facet medial branch nerve blocks at L3, L4, L5, S1 bilaterally with possible lumbar radiofrequency ablation thereafter.  Bilateral knee pain: Bilateral knee osteoarthritis status post bilateral knee replacement surgery that is been complicated by intra-articular infection that required surgical revision and debridement.  Would like to avoid any intra-articular procedures.  For the patient's persistent bilateral knee pain, he could be candidate for bilateral genicular nerve block.  This will be discussed with patient at future visits.  Informed patient on medication management to continue with primary care provider.  We will be focusing on interventional pain management.  Advised the  patient to be cautious of his concomitant opioid and benzodiazepine intake as this could impact breathing and his respiratory function.  Patient endorsed understanding.   Procedure Orders     Lumbar Epidural Injection  Interventional management options: Mr. Bagheri was informed that there is no guarantee that he would be a candidate for interventional therapies. The decision will be based on the results of diagnostic studies, as well as Mr. Mccutchan's risk profile.  Procedure(s) under consideration:  Interlaminar epidural steroid injection Transforaminal epidural steroid injection Lumbar facet medial branch nerve blocks L3, L4, L5, S1, lumbar facet radiofrequency ablation Thoracolumbar spinal cord stimulator trial.   Provider-requested follow-up: Return for Procedure, After COVID-19 restrictions lifted.  No future appointments.  Total duration of non-face-to-face encounter: 30 minutes.  Primary Care Physician: Nathan Pitch, MD Location: [facility] Note by: Nathan Santa, MD Date: 08/28/2018; Time: 12:38 PM

## 2018-08-27 ENCOUNTER — Encounter: Payer: Self-pay | Admitting: Student in an Organized Health Care Education/Training Program

## 2018-08-28 ENCOUNTER — Ambulatory Visit
Payer: 59 | Attending: Student in an Organized Health Care Education/Training Program | Admitting: Student in an Organized Health Care Education/Training Program

## 2018-08-28 ENCOUNTER — Other Ambulatory Visit: Payer: Self-pay

## 2018-08-28 VITALS — Ht 75.0 in | Wt 316.0 lb

## 2018-08-28 DIAGNOSIS — G8929 Other chronic pain: Secondary | ICD-10-CM

## 2018-08-28 DIAGNOSIS — M48061 Spinal stenosis, lumbar region without neurogenic claudication: Secondary | ICD-10-CM | POA: Insufficient documentation

## 2018-08-28 DIAGNOSIS — G608 Other hereditary and idiopathic neuropathies: Secondary | ICD-10-CM

## 2018-08-28 DIAGNOSIS — M47816 Spondylosis without myelopathy or radiculopathy, lumbar region: Secondary | ICD-10-CM | POA: Diagnosis not present

## 2018-08-28 DIAGNOSIS — T8454XD Infection and inflammatory reaction due to internal left knee prosthesis, subsequent encounter: Secondary | ICD-10-CM | POA: Diagnosis not present

## 2018-08-28 DIAGNOSIS — M9983 Other biomechanical lesions of lumbar region: Secondary | ICD-10-CM

## 2018-08-28 DIAGNOSIS — M25462 Effusion, left knee: Secondary | ICD-10-CM | POA: Diagnosis not present

## 2018-08-28 DIAGNOSIS — M25562 Pain in left knee: Secondary | ICD-10-CM | POA: Diagnosis not present

## 2018-08-28 DIAGNOSIS — Z96653 Presence of artificial knee joint, bilateral: Secondary | ICD-10-CM | POA: Diagnosis not present

## 2018-08-28 DIAGNOSIS — M5416 Radiculopathy, lumbar region: Secondary | ICD-10-CM | POA: Insufficient documentation

## 2018-09-03 ENCOUNTER — Other Ambulatory Visit
Admission: RE | Admit: 2018-09-03 | Discharge: 2018-09-03 | Disposition: A | Discharge status: Home / Self Care | Payer: 59 | Source: Ambulatory Visit | Attending: Orthopedic Surgery | Admitting: Orthopedic Surgery

## 2018-09-03 DIAGNOSIS — R700 Elevated erythrocyte sedimentation rate: Secondary | ICD-10-CM | POA: Diagnosis not present

## 2018-09-03 DIAGNOSIS — M25562 Pain in left knee: Secondary | ICD-10-CM | POA: Insufficient documentation

## 2018-09-03 DIAGNOSIS — M25462 Effusion, left knee: Secondary | ICD-10-CM | POA: Insufficient documentation

## 2018-09-03 DIAGNOSIS — Z96652 Presence of left artificial knee joint: Secondary | ICD-10-CM | POA: Diagnosis not present

## 2018-09-03 LAB — SYNOVIAL CELL COUNT + DIFF, W/ CRYSTALS
Crystals, Fluid: NONE SEEN
Eosinophils-Synovial: 0 %
Lymphocytes-Synovial Fld: 1 %
Monocyte-Macrophage-Synovial Fluid: 6 %
Neutrophil, Synovial: 93 %
WBC, Synovial: 28033 /mm3 — ABNORMAL HIGH (ref 0–200)

## 2018-09-06 ENCOUNTER — Other Ambulatory Visit: Payer: Self-pay

## 2018-09-06 ENCOUNTER — Encounter
Admission: RE | Admit: 2018-09-06 | Discharge: 2018-09-06 | Disposition: A | Discharge status: Home / Self Care | Payer: 59 | Source: Ambulatory Visit | Attending: Orthopedic Surgery | Admitting: Orthopedic Surgery

## 2018-09-06 DIAGNOSIS — Z1159 Encounter for screening for other viral diseases: Secondary | ICD-10-CM | POA: Diagnosis not present

## 2018-09-06 DIAGNOSIS — Z01812 Encounter for preprocedural laboratory examination: Secondary | ICD-10-CM | POA: Diagnosis not present

## 2018-09-06 DIAGNOSIS — Z01818 Encounter for other preprocedural examination: Secondary | ICD-10-CM | POA: Insufficient documentation

## 2018-09-06 HISTORY — DX: Gastro-esophageal reflux disease without esophagitis: K21.9

## 2018-09-06 HISTORY — DX: Adverse effect of unspecified anesthetic, initial encounter: T41.45XA

## 2018-09-06 HISTORY — DX: Other complications of anesthesia, initial encounter: T88.59XA

## 2018-09-06 LAB — SEDIMENTATION RATE: Sed Rate: 64 mm/hr — ABNORMAL HIGH (ref 0–20)

## 2018-09-06 LAB — CBC
HCT: 41.1 % (ref 39.0–52.0)
Hemoglobin: 13.3 g/dL (ref 13.0–17.0)
MCH: 29.9 pg (ref 26.0–34.0)
MCHC: 32.4 g/dL (ref 30.0–36.0)
MCV: 92.4 fL (ref 80.0–100.0)
Platelets: 575 10*3/uL — ABNORMAL HIGH (ref 150–400)
RBC: 4.45 MIL/uL (ref 4.22–5.81)
RDW: 11.9 % (ref 11.5–15.5)
WBC: 11.8 10*3/uL — ABNORMAL HIGH (ref 4.0–10.5)
nRBC: 0 % (ref 0.0–0.2)

## 2018-09-06 LAB — BASIC METABOLIC PANEL
Anion gap: 12 (ref 5–15)
BUN: 14 mg/dL (ref 6–20)
CO2: 28 mmol/L (ref 22–32)
Calcium: 9.3 mg/dL (ref 8.9–10.3)
Chloride: 99 mmol/L (ref 98–111)
Creatinine, Ser: 0.8 mg/dL (ref 0.61–1.24)
GFR calc Af Amer: >60 mL/min (ref >60)
GFR calc non Af Amer: >60 mL/min (ref >60)
Glucose, Bld: 123 mg/dL — ABNORMAL HIGH (ref 70–99)
Potassium: 3.7 mmol/L (ref 3.5–5.1)
Sodium: 139 mmol/L (ref 135–145)

## 2018-09-06 LAB — SURGICAL PCR SCREEN
MRSA, PCR: NEGATIVE
Staphylococcus aureus: POSITIVE — AB

## 2018-09-06 LAB — URINALYSIS, ROUTINE W REFLEX MICROSCOPIC
Bilirubin Urine: NEGATIVE
Glucose, UA: NEGATIVE mg/dL
Hgb urine dipstick: NEGATIVE
Ketones, ur: NEGATIVE mg/dL
Leukocytes,Ua: NEGATIVE
Nitrite: NEGATIVE
Protein, ur: NEGATIVE mg/dL
Specific Gravity, Urine: 1.02 (ref 1.005–1.030)
pH: 5 (ref 5.0–8.0)

## 2018-09-06 LAB — PROTIME-INR
INR: 1.1 (ref 0.8–1.2)
Prothrombin Time: 13.9 seconds (ref 11.4–15.2)

## 2018-09-06 LAB — APTT: aPTT: 36 seconds (ref 24–36)

## 2018-09-06 NOTE — TOC Progression Note (Signed)
Transition of Care [facility]) - Progression Note    Patient Details  Name: Nathan Chambers MRN: [id number] Date of Birth: [birth year]  Transition of Care [facility]) CM/SW [place], RN Phone Number: 09/06/2018, 4:13 PM  Clinical Narrative:     Durene Cal Benefit check for Lovenox price to CM bucket       Expected Discharge Plan and Services                                                 Social Determinants of Health (SDOH) Interventions    Readmission Risk Interventions No flowsheet data found.

## 2018-09-06 NOTE — Patient Instructions (Signed)
Your procedure is scheduled on: 09-11-18 TUESDAY Report to Same Day Surgery 2nd floor medical mall [facility] Entrance-take elevator on left to 2nd floor.  Check in with surgery information desk.) To find out your arrival time please call [phone] between 1PM - 3PM on 09-10-18 MONDAY  Remember: Instructions that are not followed completely may result in serious medical risk, up to and including death, or upon the discretion of your surgeon and anesthesiologist your surgery may need to be rescheduled.    _x___ 1. Do not eat food after midnight the night before your procedure. NO GUM OR CANDY AFTER MIDNIGHT. You may drink clear liquids up to 2 hours before you are scheduled to arrive at the hospital for your procedure.  Do not drink clear liquids within 2 hours of your scheduled arrival to the hospital.  Clear liquids include  --Water or Apple juice without pulp  --Clear carbohydrate beverage such as ClearFast or Gatorade  --Black Coffee or Clear Tea (No milk, no creamers, do not add anything to  the coffee or Tea   ____Ensure clear carbohydrate drink on the way to the hospital for bariatric patients  ____Ensure clear carbohydrate drink 3 hours before surgery for Dr Dwyane Luo patients if physician instructed.    __x__ 2. No Alcohol for 24 hours before or after surgery.   __x__3. No Smoking or e-cigarettes for 24 prior to surgery.  Do not use any chewable tobacco products for at least 6 hour prior to surgery   ____  4. Bring all medications with you on the day of surgery if instructed.    __x__ 5. Notify your doctor if there is any change in your medical condition     (cold, fever, infections).    x___6. On the morning of surgery brush your teeth with toothpaste and water.  You may rinse your mouth with mouth wash if you wish.  Do not swallow any toothpaste or mouthwash.   Do not wear jewelry, make-up, hairpins, clips or nail polish.  Do not wear lotions, powders, or perfumes. You  may wear deodorant.  Do not shave 48 hours prior to surgery. Men may shave face and neck.  Do not bring valuables to the hospital.    [facility] is not responsible for any belongings or valuables.               Contacts, dentures or bridgework may not be worn into surgery.  Leave your suitcase in the car. After surgery it may be brought to your room.  For patients admitted to the hospital, discharge time is determined by your treatment team.  _  Patients discharged the day of surgery will not be allowed to drive home.  You will need someone to drive you home and stay with you the night of your procedure.    Please read over the following fact sheets that you were given:   [facility] Preparing for Surgery and or MRSA Information   _x___ TAKE THE FOLLOWING MEDICATION THE MORNING OF SURGERY WITH A SMALL SIP OF WATER. These include:  1. XANAX (ALPRAZOLAM)  2. BUSPAR (BUSPIRONE)  3. CYMBALTA (DULOXETINE)  4. GEMFIBROZIL (LOPID)  5.  6.  ____Fleets enema or Magnesium Citrate as directed.   _x___ Use CHG Soap or sage wipes as directed on instruction sheet   ____ Use inhalers on the day of surgery and bring to hospital day of surgery  ____ Stop Metformin and Janumet 2 days prior to surgery.  ____ Take 1/2 of usual insulin dose the night before surgery and none on the morning     surgery.   _x___ Follow recommendations from Cardiologist, Pulmonologist or PCP regarding stopping Aspirin, Coumadin, Plavix ,Eliquis, Effient, or Pradaxa, and Pletal-STOP ASPIRIN NOW  X____Stop Anti-inflammatories such as Advil, Aleve, Ibuprofen, Motrin, Naproxen,MELOXICAM, Naprosyn, Goodies powders or aspirin products. OK to take Tylenol, OXYCODONE OR HYDROCODONE IF NEEDED   _x___ Stop supplements until after surgery-STOP [place] AFTER SURGERY   ____ Bring C-Pap to the hospital.

## 2018-09-07 ENCOUNTER — Ambulatory Visit
Admission: RE | Admit: 2018-09-07 | Discharge: 2018-09-07 | Disposition: A | Discharge status: Home / Self Care | Payer: 59 | Source: Ambulatory Visit | Attending: Orthopedic Surgery | Admitting: Orthopedic Surgery

## 2018-09-07 DIAGNOSIS — Z1159 Encounter for screening for other viral diseases: Secondary | ICD-10-CM | POA: Insufficient documentation

## 2018-09-07 DIAGNOSIS — Z01812 Encounter for preprocedural laboratory examination: Secondary | ICD-10-CM | POA: Diagnosis not present

## 2018-09-07 LAB — URINE CULTURE: Culture: NO GROWTH

## 2018-09-07 NOTE — TOC Benefit Eligibility Note (Signed)
Transition of Care [facility]) Benefit Eligibility Note    Patient Details  Name: Nathan Chambers MRN: [id number] Date of Birth: Oct 14, 1965   Medication/Dose: Lovenox 40mg  once daily for 14 days.   Covered?: Yes     Prescription Coverage Preferred Pharmacy: Any retail pharmacy.  Spoke with Person/Company/Phone Number:: Monica with [employer]: [phone]  Co-Pay: Estimated to be between $50 - $100.  Prior Approval: No  Deductible: (Per rep, no deductible on plain.)  Additional Notes: Some discrepancies with estimated cost.  First quote obtained through [place], however she stated it was considered a specialty drug and handed through another department.  She could only tell a price range and if coveed.  The second rep, Mendel Ryder, with the [employer] side quoted a $0 copay for both Lovenox and the generic, however was having system issues and could not verify the accuracy of that benefit.      Dannette Barbara Phone Number: [phone] or [phone] 09/07/2018, 3:45 PM

## 2018-09-07 NOTE — TOC Initial Note (Signed)
Transition of Care [facility]) - Initial/Assessment Note    Patient Details  Name: Nathan Chambers MRN: [id number] Date of Birth: [birth year]  Transition of Care [facility]) CM/SW Contact:    Su Hilt, RN Phone Number: 09/07/2018, 4:07 PM  Clinical Narrative:                 Talked to the Patient to discuss DC plan and needs He lives at home with his wife He has a RW at home and declines a 3 in 1 he stated he has a shower chair already that he uses He has declined [place] in the past and does not see a need to do so now, he agreed to think about it and revisit the idea during admission Requested lovenox price from CM bucket via email Sees Dr. Lovie Macadamia as PCP, uses [facility] for Pharmacy and can afford medication CM to follow during admission for further needs not addressed         Patient Goals and CMS Choice        Expected Discharge Plan and Services                                                Prior Living Arrangements/Services                       Activities of Daily Living      Permission Sought/Granted                  Emotional Assessment              Admission diagnosis:  ACUTE PAIN OF LEFT KNEE Patient Active Problem List   Diagnosis Date Noted  . Chronic radicular lumbar pain 08/28/2018  . History of bilateral knee replacement 08/28/2018  . Lumbar radicular pain 08/28/2018  . Lumbar facet arthropathy 08/28/2018  . Spinal stenosis of lumbar region without neurogenic claudication 08/28/2018  . Lumbar spondylosis 08/28/2018  . Neuroforaminal stenosis of lumbar spine 08/28/2018  . Sensory polyneuropathy 08/28/2018  . Infection of total left knee replacement ([place]) 06/10/2018  . Arthritis, septic, knee ([place]) 12/08/2017  . Hyperglycemia, unspecified 02/16/2016  . Anxiety 01/26/2015  . Hyperlipidemia 01/26/2015  . Hypertension 01/26/2015   PCP:  Juluis Pitch, MD Pharmacy:   [facility] [phone] - [address], [address] [place] [address] Phone: [phone] Fax: [phone]     Social Determinants of Health (SDOH) Interventions    Readmission Risk Interventions No flowsheet data found.

## 2018-09-10 LAB — NOVEL CORONAVIRUS, NAA (HOSP ORDER, SEND-OUT TO REF LAB; TAT 18-24 HRS): SARS-CoV-2, NAA: NOT DETECTED

## 2018-09-10 MED ORDER — DEXTROSE 5 % IV SOLN
3.0000 g | Freq: Once | INTRAVENOUS | Status: AC
  Administered 2018-09-11: 3 g via INTRAVENOUS
  Filled 2018-09-10: qty 3

## 2018-09-11 ENCOUNTER — Encounter: Payer: Self-pay | Admitting: *Deleted

## 2018-09-11 ENCOUNTER — Other Ambulatory Visit: Payer: Self-pay

## 2018-09-11 ENCOUNTER — Inpatient Hospital Stay
Admission: RE | Admit: 2018-09-11 | Discharge: 2018-09-14 | DRG: 467 | Disposition: A | Discharge status: Home Health | Payer: 59 | Attending: Orthopedic Surgery | Admitting: Orthopedic Surgery

## 2018-09-11 ENCOUNTER — Inpatient Hospital Stay: Payer: 59 | Admitting: Anesthesiology

## 2018-09-11 ENCOUNTER — Encounter
Admission: RE | Disposition: A | Discharge status: Home Health | Payer: Self-pay | Source: Home / Self Care | Attending: Orthopedic Surgery

## 2018-09-11 ENCOUNTER — Inpatient Hospital Stay: Payer: 59

## 2018-09-11 DIAGNOSIS — E785 Hyperlipidemia, unspecified: Secondary | ICD-10-CM | POA: Diagnosis present

## 2018-09-11 DIAGNOSIS — M19041 Primary osteoarthritis, right hand: Secondary | ICD-10-CM | POA: Diagnosis present

## 2018-09-11 DIAGNOSIS — Y831 Surgical operation with implant of artificial internal device as the cause of abnormal reaction of the patient, or of later complication, without mention of misadventure at the time of the procedure: Secondary | ICD-10-CM | POA: Diagnosis present

## 2018-09-11 DIAGNOSIS — T8454XD Infection and inflammatory reaction due to internal left knee prosthesis, subsequent encounter: Secondary | ICD-10-CM

## 2018-09-11 DIAGNOSIS — T8454XA Infection and inflammatory reaction due to internal left knee prosthesis, initial encounter: Secondary | ICD-10-CM | POA: Diagnosis not present

## 2018-09-11 DIAGNOSIS — G8918 Other acute postprocedural pain: Secondary | ICD-10-CM

## 2018-09-11 DIAGNOSIS — Z79899 Other long term (current) drug therapy: Secondary | ICD-10-CM

## 2018-09-11 DIAGNOSIS — B9561 Methicillin susceptible Staphylococcus aureus infection as the cause of diseases classified elsewhere: Secondary | ICD-10-CM | POA: Diagnosis present

## 2018-09-11 DIAGNOSIS — Z79891 Long term (current) use of opiate analgesic: Secondary | ICD-10-CM | POA: Diagnosis not present

## 2018-09-11 DIAGNOSIS — K219 Gastro-esophageal reflux disease without esophagitis: Secondary | ICD-10-CM | POA: Diagnosis present

## 2018-09-11 DIAGNOSIS — Z87891 Personal history of nicotine dependence: Secondary | ICD-10-CM

## 2018-09-11 DIAGNOSIS — M00062 Staphylococcal arthritis, left knee: Secondary | ICD-10-CM | POA: Diagnosis not present

## 2018-09-11 DIAGNOSIS — Z791 Long term (current) use of non-steroidal anti-inflammatories (NSAID): Secondary | ICD-10-CM

## 2018-09-11 DIAGNOSIS — Z8249 Family history of ischemic heart disease and other diseases of the circulatory system: Secondary | ICD-10-CM

## 2018-09-11 DIAGNOSIS — M25562 Pain in left knee: Secondary | ICD-10-CM | POA: Diagnosis not present

## 2018-09-11 DIAGNOSIS — E876 Hypokalemia: Secondary | ICD-10-CM | POA: Diagnosis not present

## 2018-09-11 DIAGNOSIS — Z7982 Long term (current) use of aspirin: Secondary | ICD-10-CM | POA: Diagnosis not present

## 2018-09-11 DIAGNOSIS — M19042 Primary osteoarthritis, left hand: Secondary | ICD-10-CM | POA: Diagnosis present

## 2018-09-11 DIAGNOSIS — Z96652 Presence of left artificial knee joint: Secondary | ICD-10-CM | POA: Diagnosis not present

## 2018-09-11 DIAGNOSIS — K760 Fatty (change of) liver, not elsewhere classified: Secondary | ICD-10-CM | POA: Diagnosis not present

## 2018-09-11 DIAGNOSIS — F319 Bipolar disorder, unspecified: Secondary | ICD-10-CM | POA: Diagnosis present

## 2018-09-11 DIAGNOSIS — Z833 Family history of diabetes mellitus: Secondary | ICD-10-CM | POA: Diagnosis not present

## 2018-09-11 DIAGNOSIS — I10 Essential (primary) hypertension: Secondary | ICD-10-CM | POA: Diagnosis present

## 2018-09-11 DIAGNOSIS — Z8262 Family history of osteoporosis: Secondary | ICD-10-CM

## 2018-09-11 DIAGNOSIS — Z978 Presence of other specified devices: Secondary | ICD-10-CM

## 2018-09-11 DIAGNOSIS — D62 Acute posthemorrhagic anemia: Secondary | ICD-10-CM | POA: Diagnosis not present

## 2018-09-11 DIAGNOSIS — Z888 Allergy status to other drugs, medicaments and biological substances status: Secondary | ICD-10-CM

## 2018-09-11 DIAGNOSIS — F419 Anxiety disorder, unspecified: Secondary | ICD-10-CM | POA: Diagnosis present

## 2018-09-11 DIAGNOSIS — Z96653 Presence of artificial knee joint, bilateral: Secondary | ICD-10-CM | POA: Diagnosis not present

## 2018-09-11 DIAGNOSIS — D649 Anemia, unspecified: Secondary | ICD-10-CM | POA: Diagnosis not present

## 2018-09-11 DIAGNOSIS — Z95828 Presence of other vascular implants and grafts: Secondary | ICD-10-CM | POA: Diagnosis not present

## 2018-09-11 HISTORY — PX: TOTAL KNEE ARTHROPLASTY: SHX125

## 2018-09-11 LAB — CREATININE, SERUM
Creatinine, Ser: 0.8 mg/dL (ref 0.61–1.24)
GFR calc Af Amer: >60 mL/min (ref >60)
GFR calc non Af Amer: >60 mL/min (ref >60)

## 2018-09-11 LAB — CBC
HCT: 37.2 % — ABNORMAL LOW (ref 39.0–52.0)
Hemoglobin: 12.3 g/dL — ABNORMAL LOW (ref 13.0–17.0)
MCH: 29.6 pg (ref 26.0–34.0)
MCHC: 33.1 g/dL (ref 30.0–36.0)
MCV: 89.4 fL (ref 80.0–100.0)
Platelets: 443 10*3/uL — ABNORMAL HIGH (ref 150–400)
RBC: 4.16 MIL/uL — ABNORMAL LOW (ref 4.22–5.81)
RDW: 12.2 % (ref 11.5–15.5)
WBC: 20 10*3/uL — ABNORMAL HIGH (ref 4.0–10.5)
nRBC: 0 % (ref 0.0–0.2)

## 2018-09-11 SURGERY — ARTHROPLASTY, KNEE, TOTAL
Anesthesia: General | Laterality: Left

## 2018-09-11 MED ORDER — ONDANSETRON HCL 4 MG PO TABS: 4.0000 mg | ORAL_TABLET | Freq: Four times a day (QID) | ORAL | Status: DC | PRN

## 2018-09-11 MED ORDER — HYDROMORPHONE HCL 1 MG/ML IJ SOLN
0.5000 mg | INTRAMUSCULAR | Status: DC | PRN
  Administered 2018-09-11 – 2018-09-14 (×12): 1 mg via INTRAVENOUS
  Filled 2018-09-11 (×12): qty 1

## 2018-09-11 MED ORDER — DOCUSATE SODIUM 100 MG PO CAPS
100.0000 mg | ORAL_CAPSULE | Freq: Two times a day (BID) | ORAL | Status: DC
  Administered 2018-09-11 – 2018-09-13 (×5): 100 mg via ORAL
  Filled 2018-09-11 (×6): qty 1

## 2018-09-11 MED ORDER — DEXMEDETOMIDINE HCL IN NACL 80 MCG/20ML IV SOLN
INTRAVENOUS | Status: AC
  Filled 2018-09-11: qty 20

## 2018-09-11 MED ORDER — GENTAMICIN SULFATE 40 MG/ML IJ SOLN
INTRAMUSCULAR | Status: AC
  Filled 2018-09-11: qty 8

## 2018-09-11 MED ORDER — ONDANSETRON HCL 4 MG/2ML IJ SOLN: 4.0000 mg | Freq: Once | INTRAMUSCULAR | Status: DC | PRN

## 2018-09-11 MED ORDER — OXYCODONE HCL 5 MG PO TABS
10.0000 mg | ORAL_TABLET | ORAL | Status: DC | PRN
  Administered 2018-09-11 – 2018-09-14 (×12): 10 mg via ORAL
  Filled 2018-09-11 (×11): qty 2

## 2018-09-11 MED ORDER — VANCOMYCIN HCL 1000 MG IV SOLR
INTRAVENOUS | Status: DC | PRN
  Administered 2018-09-11: 1000 mg
  Administered 2018-09-11 (×2): 2 g

## 2018-09-11 MED ORDER — MAGNESIUM HYDROXIDE 400 MG/5ML PO SUSP: 30.0000 mL | Freq: Every day | ORAL | Status: DC | PRN

## 2018-09-11 MED ORDER — ALPRAZOLAM 0.25 MG PO TABS
0.2500 mg | ORAL_TABLET | Freq: Two times a day (BID) | ORAL | Status: DC | PRN
  Administered 2018-09-11 – 2018-09-13 (×3): 0.25 mg via ORAL
  Filled 2018-09-11 (×3): qty 1

## 2018-09-11 MED ORDER — ACETAMINOPHEN 10 MG/ML IV SOLN
INTRAVENOUS | Status: AC
  Filled 2018-09-11: qty 100

## 2018-09-11 MED ORDER — GEMFIBROZIL 600 MG PO TABS
600.0000 mg | ORAL_TABLET | Freq: Two times a day (BID) | ORAL | Status: DC
  Administered 2018-09-11 – 2018-09-14 (×6): 600 mg via ORAL
  Filled 2018-09-11 (×7): qty 1

## 2018-09-11 MED ORDER — BISACODYL 5 MG PO TBEC: 5.0000 mg | DELAYED_RELEASE_TABLET | Freq: Every day | ORAL | Status: DC | PRN

## 2018-09-11 MED ORDER — AMLODIPINE BESYLATE 10 MG PO TABS
10.0000 mg | ORAL_TABLET | Freq: Every day | ORAL | Status: DC
  Administered 2018-09-12 – 2018-09-14 (×3): 10 mg via ORAL
  Filled 2018-09-11 (×3): qty 1

## 2018-09-11 MED ORDER — HYDROMORPHONE HCL 1 MG/ML IJ SOLN
INTRAMUSCULAR | Status: AC
  Administered 2018-09-11: 0.25 mg via INTRAVENOUS
  Filled 2018-09-11: qty 1

## 2018-09-11 MED ORDER — PROPOFOL 10 MG/ML IV BOLUS
INTRAVENOUS | Status: AC
  Filled 2018-09-11: qty 20

## 2018-09-11 MED ORDER — ACETAMINOPHEN 325 MG PO TABS: 325.0000 mg | ORAL_TABLET | Freq: Four times a day (QID) | ORAL | Status: DC | PRN

## 2018-09-11 MED ORDER — AMLODIPINE BESY-BENAZEPRIL HCL 10-40 MG PO CAPS: 1.0000 | ORAL_CAPSULE | ORAL | Status: DC

## 2018-09-11 MED ORDER — ADULT MULTIVITAMIN W/MINERALS CH
1.0000 | ORAL_TABLET | Freq: Every day | ORAL | Status: DC
  Administered 2018-09-12 – 2018-09-14 (×3): 1 via ORAL
  Filled 2018-09-11 (×3): qty 1

## 2018-09-11 MED ORDER — ONDANSETRON HCL 4 MG/2ML IJ SOLN: 4.0000 mg | Freq: Four times a day (QID) | INTRAMUSCULAR | Status: DC | PRN

## 2018-09-11 MED ORDER — FAMOTIDINE 20 MG PO TABS
ORAL_TABLET | ORAL | Status: AC
  Administered 2018-09-11: 20 mg
  Filled 2018-09-11: qty 1

## 2018-09-11 MED ORDER — METOCLOPRAMIDE HCL 5 MG/ML IJ SOLN: 5.0000 mg | Freq: Three times a day (TID) | INTRAMUSCULAR | Status: DC | PRN

## 2018-09-11 MED ORDER — PROPOFOL 10 MG/ML IV BOLUS
INTRAVENOUS | Status: AC
  Filled 2018-09-11: qty 40

## 2018-09-11 MED ORDER — ROCURONIUM BROMIDE 100 MG/10ML IV SOLN
INTRAVENOUS | Status: DC | PRN
  Administered 2018-09-11: 20 mg via INTRAVENOUS
  Administered 2018-09-11: 50 mg via INTRAVENOUS

## 2018-09-11 MED ORDER — FENTANYL CITRATE (PF) 100 MCG/2ML IJ SOLN
25.0000 ug | INTRAMUSCULAR | Status: DC | PRN
  Administered 2018-09-11 (×4): 25 ug via INTRAVENOUS

## 2018-09-11 MED ORDER — NEOMYCIN-POLYMYXIN B GU 40-200000 IR SOLN
Status: AC
  Filled 2018-09-11: qty 20

## 2018-09-11 MED ORDER — ACETAMINOPHEN 500 MG PO TABS
1000.0000 mg | ORAL_TABLET | Freq: Four times a day (QID) | ORAL | Status: AC
  Administered 2018-09-11 – 2018-09-12 (×3): 1000 mg via ORAL
  Filled 2018-09-11 (×5): qty 2

## 2018-09-11 MED ORDER — DEXAMETHASONE SODIUM PHOSPHATE 10 MG/ML IJ SOLN
INTRAMUSCULAR | Status: DC | PRN
  Administered 2018-09-11: 5 mg via INTRAVENOUS

## 2018-09-11 MED ORDER — VITAMIN C 500 MG PO TABS
500.0000 mg | ORAL_TABLET | Freq: Every day | ORAL | Status: DC
  Administered 2018-09-12 – 2018-09-14 (×3): 500 mg via ORAL
  Filled 2018-09-11 (×3): qty 1

## 2018-09-11 MED ORDER — VANCOMYCIN HCL 1000 MG IV SOLR
INTRAVENOUS | Status: AC
  Filled 2018-09-11: qty 1000

## 2018-09-11 MED ORDER — MAGNESIUM CITRATE PO SOLN: 1.0000 | Freq: Once | ORAL | Status: DC | PRN

## 2018-09-11 MED ORDER — BUPIVACAINE LIPOSOME 1.3 % IJ SUSP
INTRAMUSCULAR | Status: AC
  Filled 2018-09-11: qty 20

## 2018-09-11 MED ORDER — OXYCODONE HCL 5 MG PO TABS
5.0000 mg | ORAL_TABLET | ORAL | Status: DC | PRN
  Administered 2018-09-14: 10 mg via ORAL
  Filled 2018-09-11 (×3): qty 2

## 2018-09-11 MED ORDER — BENAZEPRIL HCL 20 MG PO TABS
40.0000 mg | ORAL_TABLET | Freq: Every day | ORAL | Status: DC
  Administered 2018-09-12 – 2018-09-14 (×3): 40 mg via ORAL
  Filled 2018-09-11 (×3): qty 2

## 2018-09-11 MED ORDER — AMITRIPTYLINE HCL 25 MG PO TABS
50.0000 mg | ORAL_TABLET | Freq: Every day | ORAL | Status: DC
  Administered 2018-09-11 – 2018-09-13 (×3): 50 mg via ORAL
  Filled 2018-09-11 (×3): qty 2

## 2018-09-11 MED ORDER — METHOCARBAMOL 750 MG PO TABS
750.0000 mg | ORAL_TABLET | Freq: Two times a day (BID) | ORAL | Status: DC | PRN
  Administered 2018-09-11 – 2018-09-13 (×4): 750 mg via ORAL
  Filled 2018-09-11 (×5): qty 1

## 2018-09-11 MED ORDER — NALOXONE HCL 0.4 MG/ML IJ SOLN: 0.4000 mg | INTRAMUSCULAR | Status: DC | PRN

## 2018-09-11 MED ORDER — ZOLPIDEM TARTRATE 5 MG PO TABS: 5.0000 mg | ORAL_TABLET | Freq: Every evening | ORAL | Status: DC | PRN

## 2018-09-11 MED ORDER — SODIUM CHLORIDE 0.9 % IV SOLN
2.0000 g | INTRAVENOUS | Status: DC
  Administered 2018-09-11 – 2018-09-14 (×16): 2 g via INTRAVENOUS
  Filled 2018-09-11 (×3): qty 2
  Filled 2018-09-11: qty 2000
  Filled 2018-09-11: qty 2
  Filled 2018-09-11 (×2): qty 2000
  Filled 2018-09-11: qty 2
  Filled 2018-09-11: qty 2000
  Filled 2018-09-11 (×10): qty 2
  Filled 2018-09-11: qty 2000

## 2018-09-11 MED ORDER — VITAMIN B-12 1000 MCG PO TABS
1000.0000 ug | ORAL_TABLET | Freq: Every day | ORAL | Status: DC
  Administered 2018-09-12 – 2018-09-14 (×3): 1000 ug via ORAL
  Filled 2018-09-11 (×3): qty 1

## 2018-09-11 MED ORDER — ROPINIROLE HCL 1 MG PO TABS
0.5000 mg | ORAL_TABLET | Freq: Every day | ORAL | Status: DC
  Administered 2018-09-11 – 2018-09-13 (×3): 0.5 mg via ORAL
  Filled 2018-09-11 (×3): qty 1

## 2018-09-11 MED ORDER — PRAVASTATIN SODIUM 20 MG PO TABS
20.0000 mg | ORAL_TABLET | Freq: Every evening | ORAL | Status: DC
  Administered 2018-09-11 – 2018-09-13 (×3): 20 mg via ORAL
  Filled 2018-09-11 (×3): qty 1

## 2018-09-11 MED ORDER — FENTANYL CITRATE (PF) 100 MCG/2ML IJ SOLN
INTRAMUSCULAR | Status: DC | PRN
  Administered 2018-09-11: 50 ug via INTRAVENOUS
  Administered 2018-09-11: 25 ug via INTRAVENOUS
  Administered 2018-09-11: 100 ug via INTRAVENOUS
  Administered 2018-09-11: 25 ug via INTRAVENOUS
  Administered 2018-09-11: 50 ug via INTRAVENOUS

## 2018-09-11 MED ORDER — PROPOFOL 10 MG/ML IV BOLUS
INTRAVENOUS | Status: DC | PRN
  Administered 2018-09-11: 30 mg via INTRAVENOUS
  Administered 2018-09-11: 200 mg via INTRAVENOUS

## 2018-09-11 MED ORDER — SUGAMMADEX SODIUM 500 MG/5ML IV SOLN
INTRAVENOUS | Status: DC | PRN
  Administered 2018-09-11: 300 mg via INTRAVENOUS

## 2018-09-11 MED ORDER — LACTATED RINGERS IV SOLN
INTRAVENOUS | Status: DC
  Administered 2018-09-11 (×3): via INTRAVENOUS

## 2018-09-11 MED ORDER — LIDOCAINE HCL (CARDIAC) PF 100 MG/5ML IV SOSY
PREFILLED_SYRINGE | INTRAVENOUS | Status: DC | PRN
  Administered 2018-09-11: 80 mg via INTRAVENOUS

## 2018-09-11 MED ORDER — HYDROMORPHONE HCL 1 MG/ML IJ SOLN
INTRAMUSCULAR | Status: DC | PRN
  Administered 2018-09-11 (×2): 0.5 mg via INTRAVENOUS

## 2018-09-11 MED ORDER — ACETAMINOPHEN 10 MG/ML IV SOLN
INTRAVENOUS | Status: DC | PRN
  Administered 2018-09-11: 1000 mg via INTRAVENOUS

## 2018-09-11 MED ORDER — NEOMYCIN-POLYMYXIN B GU 40-200000 IR SOLN
Status: DC | PRN
  Administered 2018-09-11: 16 mL

## 2018-09-11 MED ORDER — METHOCARBAMOL 500 MG PO TABS
ORAL_TABLET | ORAL | Status: AC
  Administered 2018-09-11: 750 mg via ORAL
  Filled 2018-09-11: qty 2

## 2018-09-11 MED ORDER — OXYCODONE HCL 5 MG PO TABS
ORAL_TABLET | ORAL | Status: AC
  Administered 2018-09-11: 12:00:00 10 mg via ORAL
  Filled 2018-09-11: qty 2

## 2018-09-11 MED ORDER — MORPHINE SULFATE (PF) 10 MG/ML IV SOLN
INTRAVENOUS | Status: AC
  Filled 2018-09-11: qty 1

## 2018-09-11 MED ORDER — SODIUM CHLORIDE 0.9 % IV SOLN
INTRAVENOUS | Status: DC
  Administered 2018-09-11 – 2018-09-13 (×6): via INTRAVENOUS

## 2018-09-11 MED ORDER — FENTANYL CITRATE (PF) 250 MCG/5ML IJ SOLN
INTRAMUSCULAR | Status: AC
  Filled 2018-09-11: qty 5

## 2018-09-11 MED ORDER — EPHEDRINE SULFATE 50 MG/ML IJ SOLN
INTRAMUSCULAR | Status: AC
  Filled 2018-09-11: qty 1

## 2018-09-11 MED ORDER — BUSPIRONE HCL 5 MG PO TABS
5.0000 mg | ORAL_TABLET | Freq: Two times a day (BID) | ORAL | Status: DC
  Administered 2018-09-11: 22:00:00 10 mg via ORAL
  Administered 2018-09-12: 09:00:00 5 mg via ORAL
  Administered 2018-09-12: 23:00:00 10 mg via ORAL
  Administered 2018-09-13: 5 mg via ORAL
  Administered 2018-09-13 – 2018-09-14 (×2): 10 mg via ORAL
  Filled 2018-09-11 (×8): qty 2

## 2018-09-11 MED ORDER — FAMOTIDINE 20 MG PO TABS: 20.0000 mg | ORAL_TABLET | Freq: Once | ORAL | Status: DC

## 2018-09-11 MED ORDER — BUPIVACAINE-EPINEPHRINE (PF) 0.25% -1:200000 IJ SOLN
INTRAMUSCULAR | Status: AC
  Filled 2018-09-11: qty 30

## 2018-09-11 MED ORDER — OCUVITE-LUTEIN PO CAPS
1.0000 | ORAL_CAPSULE | Freq: Every day | ORAL | Status: DC
  Administered 2018-09-12 – 2018-09-14 (×3): 1 via ORAL
  Filled 2018-09-11 (×4): qty 1

## 2018-09-11 MED ORDER — HYDROMORPHONE HCL 1 MG/ML IJ SOLN
INTRAMUSCULAR | Status: AC
  Filled 2018-09-11: qty 1

## 2018-09-11 MED ORDER — HYDROMORPHONE HCL 1 MG/ML IJ SOLN
INTRAMUSCULAR | Status: AC
  Administered 2018-09-11: 0.5 mg via INTRAVENOUS
  Filled 2018-09-11: qty 1

## 2018-09-11 MED ORDER — FENTANYL CITRATE (PF) 100 MCG/2ML IJ SOLN
INTRAMUSCULAR | Status: AC
  Administered 2018-09-11: 25 ug via INTRAVENOUS
  Filled 2018-09-11: qty 2

## 2018-09-11 MED ORDER — HYDROMORPHONE HCL 1 MG/ML IJ SOLN
0.2500 mg | INTRAMUSCULAR | Status: DC | PRN
  Administered 2018-09-11: 11:00:00 0.5 mg via INTRAVENOUS
  Administered 2018-09-11 (×2): 0.25 mg via INTRAVENOUS
  Administered 2018-09-11 (×2): 0.5 mg via INTRAVENOUS

## 2018-09-11 MED ORDER — ONDANSETRON HCL 4 MG/2ML IJ SOLN
INTRAMUSCULAR | Status: DC | PRN
  Administered 2018-09-11: 4 mg via INTRAVENOUS

## 2018-09-11 MED ORDER — SODIUM CHLORIDE (PF) 0.9 % IJ SOLN
INTRAMUSCULAR | Status: AC
  Filled 2018-09-11: qty 100

## 2018-09-11 MED ORDER — OXYCODONE HCL ER 10 MG PO T12A
10.0000 mg | EXTENDED_RELEASE_TABLET | Freq: Two times a day (BID) | ORAL | Status: DC
  Administered 2018-09-11 – 2018-09-12 (×2): 10 mg via ORAL
  Filled 2018-09-11 (×5): qty 1

## 2018-09-11 MED ORDER — CEFAZOLIN SODIUM-DEXTROSE 2-4 GM/100ML-% IV SOLN
2.0000 g | Freq: Four times a day (QID) | INTRAVENOUS | Status: DC
  Administered 2018-09-11: 14:00:00 2 g via INTRAVENOUS
  Filled 2018-09-11 (×6): qty 100

## 2018-09-11 MED ORDER — DEXMEDETOMIDINE HCL IN NACL 200 MCG/50ML IV SOLN
INTRAVENOUS | Status: DC | PRN
  Administered 2018-09-11: 4 ug via INTRAVENOUS
  Administered 2018-09-11 (×2): 8 ug via INTRAVENOUS

## 2018-09-11 MED ORDER — METOCLOPRAMIDE HCL 5 MG PO TABS: 5.0000 mg | ORAL_TABLET | Freq: Three times a day (TID) | ORAL | Status: DC | PRN

## 2018-09-11 MED ORDER — CHLORTHALIDONE 25 MG PO TABS
12.5000 mg | ORAL_TABLET | Freq: Every day | ORAL | Status: DC
  Administered 2018-09-11 – 2018-09-14 (×4): 12.5 mg via ORAL
  Filled 2018-09-11 (×5): qty 0.5

## 2018-09-11 MED ORDER — FOLIC ACID 1 MG PO TABS
1.0000 mg | ORAL_TABLET | Freq: Every day | ORAL | Status: DC
  Administered 2018-09-12 – 2018-09-14 (×3): 1 mg via ORAL
  Filled 2018-09-11 (×3): qty 1

## 2018-09-11 MED ORDER — ENOXAPARIN SODIUM 40 MG/0.4ML ~~LOC~~ SOLN
40.0000 mg | SUBCUTANEOUS | Status: DC
  Administered 2018-09-12 – 2018-09-14 (×3): 40 mg via SUBCUTANEOUS
  Filled 2018-09-11 (×3): qty 0.4

## 2018-09-11 MED ORDER — ASPIRIN EC 81 MG PO TBEC
81.0000 mg | DELAYED_RELEASE_TABLET | Freq: Every day | ORAL | Status: DC
  Administered 2018-09-12 – 2018-09-14 (×3): 81 mg via ORAL
  Filled 2018-09-11 (×3): qty 1

## 2018-09-11 MED ORDER — SUCCINYLCHOLINE CHLORIDE 20 MG/ML IJ SOLN
INTRAMUSCULAR | Status: DC | PRN
  Administered 2018-09-11: 140 mg via INTRAVENOUS

## 2018-09-11 MED ORDER — SUGAMMADEX SODIUM 500 MG/5ML IV SOLN
INTRAVENOUS | Status: AC
  Filled 2018-09-11: qty 5

## 2018-09-11 MED ORDER — DIPHENHYDRAMINE HCL 12.5 MG/5ML PO ELIX: 12.5000 mg | ORAL_SOLUTION | ORAL | Status: DC | PRN

## 2018-09-11 MED ORDER — DULOXETINE HCL 30 MG PO CPEP
60.0000 mg | ORAL_CAPSULE | Freq: Every morning | ORAL | Status: DC
  Administered 2018-09-12 – 2018-09-14 (×3): 60 mg via ORAL
  Filled 2018-09-11 (×3): qty 2

## 2018-09-11 MED ORDER — VANCOMYCIN HCL 1000 MG IV SOLR
INTRAVENOUS | Status: AC
  Filled 2018-09-11: qty 2000

## 2018-09-11 SURGICAL SUPPLY — 68 items
10mm blade moreland cementless ×3 IMPLANT
AUGMENT TIBL 52 AP 81 KNEE LRG (Miscellaneous) ×1 IMPLANT
BANDAGE ACE 6X5 VEL STRL LF (GAUZE/BANDAGES/DRESSINGS) IMPLANT
BLADE CLIPPER SURG (BLADE) ×3 IMPLANT
BLADE SAGITTAL 25.0X1.19X90 (BLADE) ×4 IMPLANT
BLADE SAGITTAL 25.0X1.19X90MM (BLADE) ×2
CANISTER SUCT 1200ML W/VALVE (MISCELLANEOUS) ×3 IMPLANT
CANISTER SUCT 3000ML PPV (MISCELLANEOUS) ×9 IMPLANT
CEMENT HV SMART SET (Cement) ×9 IMPLANT
CHLORAPREP W/TINT 26 (MISCELLANEOUS) ×3 IMPLANT
COOLER POLAR GLACIER W/PUMP (MISCELLANEOUS) IMPLANT
COVER WAND RF STERILE (DRAPES) ×3 IMPLANT
CUFF TOURN SGL QUICK 24 (TOURNIQUET CUFF)
CUFF TOURN SGL QUICK 30 (TOURNIQUET CUFF) ×2
CUFF TRNQT CYL 24X4X16.5-23 (TOURNIQUET CUFF) IMPLANT
CUFF TRNQT CYL 30X4X21-28X (TOURNIQUET CUFF) ×1 IMPLANT
DRAPE SHEET LG 3/4 BI-LAMINATE (DRAPES) ×6 IMPLANT
ELECT CAUTERY BLADE 6.4 (BLADE) ×3 IMPLANT
ELECT REM PT RETURN 9FT ADLT (ELECTROSURGICAL) ×3
ELECTRODE REM PT RTRN 9FT ADLT (ELECTROSURGICAL) ×1 IMPLANT
FEMORAL 53 AP 75 KNEE LRG (Miscellaneous) ×3 IMPLANT
GAUZE SPONGE 4X4 12PLY STRL (GAUZE/BANDAGES/DRESSINGS) ×3 IMPLANT
GAUZE XEROFORM 1X8 LF (GAUZE/BANDAGES/DRESSINGS) ×3 IMPLANT
GLOVE BIOGEL PI IND STRL 9 (GLOVE) ×1 IMPLANT
GLOVE BIOGEL PI INDICATOR 9 (GLOVE) ×2
GLOVE INDICATOR 8.0 STRL GRN (GLOVE) ×3 IMPLANT
GLOVE SURG ORTHO 8.0 STRL STRW (GLOVE) ×3 IMPLANT
GLOVE SURG SYN 9.0  PF PI (GLOVE) ×4
GLOVE SURG SYN 9.0 PF PI (GLOVE) ×2 IMPLANT
GOWN SRG 2XL LVL 4 RGLN SLV (GOWNS) ×1 IMPLANT
GOWN STRL NON-REIN 2XL LVL4 (GOWNS) ×2
GOWN STRL REUS W/ TWL LRG LVL3 (GOWN DISPOSABLE) ×1 IMPLANT
GOWN STRL REUS W/ TWL XL LVL3 (GOWN DISPOSABLE) ×1 IMPLANT
GOWN STRL REUS W/TWL LRG LVL3 (GOWN DISPOSABLE) ×2
GOWN STRL REUS W/TWL XL LVL3 (GOWN DISPOSABLE) ×2
HANDPIECE VERSAJET DEBRIDEMENT (MISCELLANEOUS) ×3 IMPLANT
HOLDER FOLEY CATH W/STRAP (MISCELLANEOUS) ×3 IMPLANT
HOOD PEEL AWAY FLYTE STAYCOOL (MISCELLANEOUS) ×6 IMPLANT
INSERT TIB LCS RP LRG 12.5 (Knees) ×3 IMPLANT
KIT PREVENA INCISION MGT20CM45 (CANNISTER) ×3 IMPLANT
KIT STIMULAN RAPID CURE 5CC (Orthopedic Implant) ×6 IMPLANT
KIT TURNOVER KIT A (KITS) ×3 IMPLANT
NDL SAFETY ECLIPSE 18X1.5 (NEEDLE) ×1 IMPLANT
NEEDLE HYPO 18GX1.5 SHARP (NEEDLE) ×2
NEEDLE SPNL 18GX3.5 QUINCKE PK (NEEDLE) IMPLANT
NEEDLE SPNL 20GX3.5 QUINCKE YW (NEEDLE) IMPLANT
NS IRRIG 1000ML POUR BTL (IV SOLUTION) ×3 IMPLANT
PACK TOTAL KNEE (MISCELLANEOUS) ×3 IMPLANT
PAD WRAPON POLAR KNEE (MISCELLANEOUS) IMPLANT
PULSAVAC PLUS IRRIG FAN TIP (DISPOSABLE) ×3
SCALPEL PROTECTED #10 DISP (BLADE) ×9 IMPLANT
SOL .9 NS 3000ML IRR  AL (IV SOLUTION) ×6
SOL .9 NS 3000ML IRR UROMATIC (IV SOLUTION) ×3 IMPLANT
STAPLER SKIN PROX 35W (STAPLE) ×3 IMPLANT
SUCTION FRAZIER HANDLE 10FR (MISCELLANEOUS) ×2
SUCTION TUBE FRAZIER 10FR DISP (MISCELLANEOUS) ×1 IMPLANT
SUT DVC 2 QUILL PDO  T11 36X36 (SUTURE) ×2
SUT DVC 2 QUILL PDO T11 36X36 (SUTURE) ×1 IMPLANT
SUT ETHIBOND 2 V 37 (SUTURE) ×3 IMPLANT
SUT V-LOC 90 ABS DVC 3-0 CL (SUTURE) ×3 IMPLANT
SYR 20CC LL (SYRINGE) ×3 IMPLANT
SYR 50ML LL SCALE MARK (SYRINGE) ×6 IMPLANT
TIBIAL 52 AP 81 KNEE LRG (Miscellaneous) ×3 IMPLANT
TIP FAN IRRIG PULSAVAC PLUS (DISPOSABLE) ×1 IMPLANT
TOWEL OR 17X26 4PK STRL BLUE (TOWEL DISPOSABLE) ×3 IMPLANT
TOWER CARTRIDGE SMART MIX (DISPOSABLE) ×6 IMPLANT
TRAY FOLEY MTR SLVR 16FR STAT (SET/KITS/TRAYS/PACK) ×3 IMPLANT
WRAPON POLAR PAD KNEE (MISCELLANEOUS)

## 2018-09-11 NOTE — Transfer of Care (Signed)
Immediate Anesthesia Transfer of Care Note  Patient: Nathan Chambers  Procedure(s) Performed: TOTAL KNEE ARTHROPLASTY - LEFT - HARDWARE REMOVAL WITH INSERTION OF ANTIBIOTIC SPACER (Left )  Patient Location: PACU  Anesthesia Type:General  Level of Consciousness: awake, oriented, drowsy and patient cooperative  Airway & Oxygen Therapy: Patient Spontanous Breathing and Patient connected to face mask oxygen  Post-op Assessment: Report given to RN, Post -op Vital signs reviewed and stable and Patient moving all extremities  Post vital signs: Reviewed and stable  Last Vitals:  Vitals Value Taken Time  BP 161/92 09/11/2018 10:45 AM  Temp 37.1 C 09/11/2018 10:45 AM  Pulse 97 09/11/2018 10:49 AM  Resp 8 09/11/2018 10:47 AM  SpO2 98 % 09/11/2018 10:49 AM  Vitals shown include unvalidated device data.  Last Pain:  Vitals:   09/11/18 1045  TempSrc:   PainSc: 8          Complications: No apparent anesthesia complications

## 2018-09-11 NOTE — Consult Note (Signed)
NAME: Nathan Chambers  DOB: [birth year]  MRN: [id number]  Date/Time: 09/11/2018 4:24 PM  REQUESTING PROVIDER:Menz Subjective:  REASON FOR CONSULT: Prosthetic joint infection- s/p explantation ? Nathan Chambers is a 53 y.o. with a history of bilateral total knee arthroplasty hypertension, hyperlipidemia and nonalcoholic fatty liver diseaseand is being treated for left knee PJI due to MSSA since aug 2019  Is here for MSSA infection of the left knee and has undergone total explantation of the hardware with insertion  of antibiotic spacer on 09/11/18  His history is  as follows He had B/l knee replacement on 04/19/2016 by Dr.Menz. was doing okay until Aug 2019. Pt was in [place] 12/08/17 and 12/12/17 for left knee pain and swelling following an injury he sustained by weed mover to the left calf. Patient had left knee arthrotomy with irrigation and debridement and polyethylene exchange and placement of antibiotic impregnated bio composite beedson 12/08/2017 by Dr. Marry Guan. . The synovial culture came back as staph aureusand he wastreated with 6 weeks ofIV cefazolin and PO rifampin. After the completion of IV he has been on Bactrim and rifampin  He was followed by me as Op and his last labs from 05/09/18 showed CRP of 1 and ESR of 8 .   on 06/08/18. he developed sudden onset left knee pain and could not walk after missing 2 days of antibiotics.He was admitted to the hospital on 06/10/18 WBC was 11, CRP 21.5 and ESR 39. He was seen by Dr.Menz and taken for I/D of left knee with polyexchange and tissue culture was sent.  He was sent home on Bactrim and rifampin on 06/15/18. Culture was negative.there was a question whether it was CPPD arthropathy. I saw him in my office on 08/09/18 and he was doing well with some pain and swelling left knee but no fever or chills. He was ambulating. He presented to ortho on 4/29  With sudden onset pain and swelling  Of 2 weeks duration.He was given a 10 day steroid taper and  asked to ICE and elevate his knee As his ESR was high at 95, he returned to see ortho on 5/4 and aspiration of the left knee and the culture came back as MSSA. He was brought to the hospital and underwent removal of TKA today I Am consulted for antibiotic management. Patient says he does not have any fever  Past Medical History:  Diagnosis Date  . Anxiety   . Arthritis    knees, hands  . Bipolar disorder ([place])   . Chronic kidney disease    arf 2014 due to dehydration  . Complication of anesthesia    woke up during last knee surgery feb 2020  . GERD (gastroesophageal reflux disease)    rare no meds  . Headache   . Hemorrhoids   . Hyperlipidemia   . Hypertension   . LFT elevation   . NAFLD (nonalcoholic fatty liver disease)     Past Surgical History:  Procedure Laterality Date  . ACHILLES TENDON SURGERY Right 10/30/2015   Procedure: ACHILLES TENDON REPAIR/ HEGLAND OSTECTOMY;  Surgeon: [name], DPM;  Location: [facility];  Service: Podiatry;  Laterality: Right;  . COLONOSCOPY    . COLONOSCOPY WITH PROPOFOL N/A 04/07/2017   Procedure: COLONOSCOPY WITH PROPOFOL;  Surgeon: Toledo, Benay Pike, MD;  Location: [facility];  Service: Gastroenterology;  Laterality: N/A;  . FRACTURE SURGERY Left    MIDDLE FINGER  . HERNIA REPAIR    . I&D KNEE WITH POLY EXCHANGE Left  12/08/2017   Procedure: IRRIGATION AND DEBRIDEMENT KNEE WITH POLY EXCHANGE;  Surgeon: Dereck Leep, MD;  Location: [facility];  Service: Orthopedics;  Laterality: Left;  . JOINT REPLACEMENT Bilateral 2017   bilateral knees  . KNEE ARTHROTOMY Left 12/08/2017   Procedure: KNEE ARTHROTOMY, I&D, [place] OUT, POLY EXCHANGE;  Surgeon: Dereck Leep, MD;  Location: [facility];  Service: Orthopedics;  Laterality: Left;  . LIPOMA EXCISION    . TOTAL KNEE ARTHROPLASTY Bilateral 04/19/2016   Procedure: TOTAL KNEE BILATERAL;  Surgeon: Hessie Knows, MD;  Location: [facility];  Service: Orthopedics;  Laterality: Bilateral;  . TOTAL KNEE  REVISION Left 06/11/2018   Procedure: INCISION AND DRAINAGE OF LEFT KNEE, POLY EXCHANGE;  Surgeon: Hessie Knows, MD;  Location: [facility];  Service: Orthopedics;  Laterality: Left;  Marland Kitchen VASECTOMY      Social History   Socioeconomic History  . Marital status: Married    Spouse name: Not on file  . Number of children: Not on file  . Years of education: Not on file  . Highest education level: Not on file  Occupational History  . Not on file  Social Needs  . Financial resource strain: Somewhat hard  . Food insecurity:    Worry: Never true    Inability: Never true  . Transportation needs:    Medical: Patient refused    Non-medical: Patient refused  Tobacco Use  . Smoking status: Never Smoker  . Smokeless tobacco: Former Systems developer    Types: Chew  Substance and Sexual Activity  . Alcohol use: No  . Drug use: No  . Sexual activity: Yes  Lifestyle  . Physical activity:    Days per week: 1 day    Minutes per session: 30 min  . Stress: To some extent  Relationships  . Social connections:    Talks on phone: More than three times a week    Gets together: More than three times a week    Attends religious service: Never    Active member of club or organization: No    Attends meetings of clubs or organizations: Never    Relationship status: Married  . Intimate partner violence:    Fear of current or ex partner: Patient refused    Emotionally abused: Patient refused    Physically abused: Patient refused    Forced sexual activity: Patient refused  Other Topics Concern  . Not on file  Social History Narrative  . Not on file    History reviewed. No pertinent family history. Allergies  Allergen Reactions  . Etodolac Itching   ? Current Facility-Administered Medications  Medication Dose Route Frequency Provider Last Rate Last Dose  . 0.9 %  sodium chloride infusion   Intravenous Continuous Hessie Knows, MD 100 mL/hr at 09/11/18 1333    . acetaminophen (TYLENOL) tablet 1,000 mg  1,000  mg Oral Q6H Hessie Knows, MD      . Derrill Memo ON 09/12/2018] acetaminophen (TYLENOL) tablet 325-650 mg  325-650 mg Oral Q6H PRN Hessie Knows, MD      . ALPRAZolam Duanne Moron) tablet 0.25 mg  0.25 mg Oral BID PRN Hessie Knows, MD      . amitriptyline (ELAVIL) tablet 50 mg  50 mg Oral QHS Hessie Knows, MD      . Derrill Memo ON 09/12/2018] amLODipine (NORVASC) tablet 10 mg  10 mg Oral Daily Hessie Knows, MD       And  . Derrill Memo ON 09/12/2018] benazepril (LOTENSIN) tablet 40 mg  40 mg Oral  Daily Hessie Knows, MD      . Derrill Memo ON 09/12/2018] aspirin EC tablet 81 mg  81 mg Oral Daily Hessie Knows, MD      . bisacodyl (DULCOLAX) EC tablet 5 mg  5 mg Oral Daily PRN Hessie Knows, MD      . busPIRone (BUSPAR) tablet 5-10 mg  5-10 mg Oral BID Hessie Knows, MD      . chlorthalidone (HYGROTON) tablet 12.5 mg  12.5 mg Oral Daily Hessie Knows, MD   12.5 mg at 09/11/18 1407  . diphenhydrAMINE (BENADRYL) 12.5 MG/5ML elixir 12.5-25 mg  12.5-25 mg Oral Q4H PRN Hessie Knows, MD      . docusate sodium (COLACE) capsule 100 mg  100 mg Oral BID Hessie Knows, MD      . Derrill Memo ON 09/12/2018] DULoxetine (CYMBALTA) DR capsule 60 mg  60 mg Oral q morning - 10a Hessie Knows, MD      . Derrill Memo ON 09/12/2018] enoxaparin (LOVENOX) injection 40 mg  40 mg Subcutaneous Q24H Hessie Knows, MD      . folic acid (FOLVITE) tablet 1 mg  1 mg Oral Daily Hessie Knows, MD      . gemfibrozil (LOPID) tablet 600 mg  600 mg Oral BID AC Hessie Knows, MD   600 mg at 09/11/18 1600  . HYDROmorphone (DILAUDID) injection 0.5-1 mg  0.5-1 mg Intravenous Q4H PRN Hessie Knows, MD   1 mg at 09/11/18 1334  . magnesium citrate solution 1 Bottle  1 Bottle Oral Once PRN Hessie Knows, MD      . magnesium hydroxide (MILK OF MAGNESIA) suspension 30 mL  30 mL Oral Daily PRN Hessie Knows, MD      . methocarbamol (ROBAXIN) tablet 750 mg  750 mg Oral BID PRN Hessie Knows, MD   750 mg at 09/11/18 1142  . metoCLOPramide (REGLAN) tablet 5-10 mg  5-10 mg Oral Q8H PRN  Hessie Knows, MD       Or  . metoCLOPramide (REGLAN) injection 5-10 mg  5-10 mg Intravenous Q8H PRN Hessie Knows, MD      . multivitamin with minerals tablet 1 tablet  1 tablet Oral Daily Hessie Knows, MD      . multivitamin-lutein (OCUVITE-LUTEIN) capsule 1 capsule  1 capsule Oral Daily Hessie Knows, MD      . nafcillin 2 g in sodium chloride 0.9 % 100 mL IVPB  2 g Intravenous Q4H Tsosie Billing, MD      . naloxone (NARCAN) injection 0.4 mg  0.4 mg Intravenous PRN Hessie Knows, MD      . ondansetron [facility]) tablet 4 mg  4 mg Oral Q6H PRN Hessie Knows, MD       Or  . ondansetron [facility]) injection 4 mg  4 mg Intravenous Q6H PRN Hessie Knows, MD      . oxyCODONE (Oxy IR/ROXICODONE) immediate release tablet 10 mg  10 mg Oral Q4H PRN Hessie Knows, MD   10 mg at 09/11/18 1600  . oxyCODONE (Oxy IR/ROXICODONE) immediate release tablet 5-10 mg  5-10 mg Oral Q4H PRN Hessie Knows, MD      . oxyCODONE (OXYCONTIN) 12 hr tablet 10 mg  10 mg Oral Q12H Hessie Knows, MD      . pravastatin (PRAVACHOL) tablet 20 mg  20 mg Oral QPM Hessie Knows, MD      . rOPINIRole (REQUIP) tablet 0.5 mg  0.5 mg Oral QHS Hessie Knows, MD      . vitamin B-12 (CYANOCOBALAMIN) tablet 1,000 mcg  1,000 mcg Oral Daily Hessie Knows, MD      . vitamin C (ASCORBIC ACID) tablet 500 mg  500 mg Oral Daily Hessie Knows, MD      . zolpidem (AMBIEN) tablet 5 mg  5 mg Oral QHS PRN,MR X 1 Hessie Knows, MD         Abtx:  Anti-infectives (From admission, onward)   Start     Dose/Rate Route Frequency Ordered Stop   09/11/18 2000  nafcillin 2 g in sodium chloride 0.9 % 100 mL IVPB     2 g 200 mL/hr over 30 Minutes Intravenous Every 4 hours 09/11/18 1532     09/11/18 1400  ceFAZolin (ANCEF) IVPB 2g/100 mL premix  Status:  Discontinued     2 g 200 mL/hr over 30 Minutes Intravenous Every 6 hours 09/11/18 1225 09/11/18 1532   09/11/18 0918  vancomycin (VANCOCIN) powder  Status:  Discontinued       As needed 09/11/18 0918  09/11/18 1039   09/10/18 2200  ceFAZolin (ANCEF) 3 g in dextrose 5 % 50 mL IVPB     3 g 100 mL/hr over 30 Minutes Intravenous  Once 09/10/18 2151 09/11/18 0805      REVIEW OF SYSTEMS:  Const: negative fever, negative chills, negative weight loss Eyes: negative diplopia or visual changes, negative eye pain ENT: negative coryza, negative sore throat Resp: negative cough, hemoptysis, dyspnea Cards: negative for chest pain, palpitations, lower extremity edema GU: negative for frequency, dysuria and hematuria GI: Negative for abdominal pain, diarrhea, bleeding, constipation Skin: negative for rash and pruritus Heme: negative for easy bruising and gum/nose bleeding MS: Severe pain left knee and difficulty walking Neurolo:negative for headaches, dizziness, vertigo, memory problems  Psych: negative for feelings of anxiety, depression  Endocrine: negative for thyroid, diabetes Allergy/Immunology-as noted above  Objective:  VITALS:  BP (!) 142/83 (BP Location: Left Arm)   Pulse 87   Temp 97.8 F (36.6 C)   Resp (!) 22   Ht 6' 3" (1.905 m)   Wt (!) 143.3 kg   SpO2 97%   BMI 39.49 kg/m  PHYSICAL EXAM:  General: Alert, cooperative, no distress, appears stated age.  Obese Head: Normocephalic, without obvious abnormality, atraumatic. Eyes: Conjunctivae clear, anicteric sclerae. Pupils are equal ENT Nares normal. No drainage or sinus tenderness. Lips, mucosa, and tongue normal. No Thrush Neck: Supple, symmetrical, no adenopathy, thyroid: non tender no carotid bruit and no JVD. Back: No CVA tenderness. Lungs: Clear to auscultation bilaterally. No Wheezing or Rhonchi. No rales. Heart: Regular rate and rhythm, no murmur, rub or gallop. Abdomen: Soft, non-tender,not distended. Bowel sounds normal. No masses Extremities: Left knee surgical site covered with a wound VAC Swollen  skin: No rashes or lesions. Or bruising Lymph: Cervical, supraclavicular normal. Neurologic: Grossly non-focal  Pertinent Labs Lab Results CBC    Component Value Date/Time   WBC 20.0 (H) 09/11/2018 1243   RBC 4.16 (L) 09/11/2018 1243   HGB 12.3 (L) 09/11/2018 1243   HGB 13.9 01/06/2013 0424   HCT 37.2 (L) 09/11/2018 1243   HCT 39.4 (L) 01/06/2013 0424   PLT 443 (H) 09/11/2018 1243   PLT 254 01/06/2013 0424   MCV 89.4 09/11/2018 1243   MCV 91 01/06/2013 0424   MCH 29.6 09/11/2018 1243   MCHC 33.1 09/11/2018 1243   RDW 12.2 09/11/2018 1243   RDW 13.4 01/06/2013 0424   LYMPHSABS 1.7 08/09/2018 1139   LYMPHSABS 2.1 01/06/2013 0424   MONOABS 0.7 08/09/2018 1139  MONOABS 0.7 01/06/2013 0424   EOSABS 0.5 08/09/2018 1139   EOSABS 0.3 01/06/2013 0424   BASOSABS 0.1 08/09/2018 1139   BASOSABS 0.1 01/06/2013 0424    CMP Latest Ref Rng & Units 09/11/2018 09/06/2018 08/09/2018  Glucose 70 - 99 mg/dL - 123(H) 117(H)  BUN 6 - 20 mg/dL - 14 13  Creatinine 0.61 - 1.24 mg/dL 0.80 0.80 0.72  Sodium 135 - 145 mmol/L - 139 138  Potassium 3.5 - 5.1 mmol/L - 3.7 3.9  Chloride 98 - 111 mmol/L - 99 102  CO2 22 - 32 mmol/L - 28 24  Calcium 8.9 - 10.3 mg/dL - 9.3 9.6  Total Protein 6.5 - 8.1 g/dL - - 7.9  Total Bilirubin 0.3 - 1.2 mg/dL - - 0.5  Alkaline Phos 38 - 126 U/L - - 75  AST 15 - 41 U/L - - 32  ALT 0 - 44 U/L - - 39      Microbiology: Recent Results (from the past 240 hour(s))  Urine culture     Status: None   Collection Time: 09/06/18  9:26 AM  Result Value Ref Range Status   Specimen Description   Final    URINE, RANDOM Performed at [facility], [address], [place], [place] [address]    Special Requests   Final    NONE Performed at [facility], [address], [place], [place] [address]    Culture   Final    NO GROWTH Performed at [facility], [address], [place], [place] [address]    Report Status 09/07/2018 FINAL  Final  Surgical pcr screen     Status: Abnormal   Collection Time: 09/06/18  9:26 AM  Result Value Ref Range Status   MRSA, PCR  NEGATIVE NEGATIVE Final   Staphylococcus aureus POSITIVE (A) NEGATIVE Final    Comment: (NOTE) The Xpert SA Assay (FDA approved for NASAL specimens in patients 92 years of age and older), is one component of a comprehensive surveillance program. It is not intended to diagnose infection nor to guide or monitor treatment. Performed at [facility], [address] [place], [place] [address]   Novel Coronavirus, NAA (hospital order; send-out to ref lab)     Status: None   Collection Time: 09/07/18 12:43 PM  Result Value Ref Range Status   SARS-CoV-2, NAA NOT DETECTED NOT DETECTED Final    Comment: (NOTE) Testing was performed using the cobas(R) SARS-CoV-2 test. This test was developed and its performance characteristics determined by [employer]. This test has not been FDA cleared or approved. This test has been authorized by FDA under an Emergency Use Authorization (EUA). This test is only authorized for the duration of time the declaration that circumstances exist justifying the authorization of the emergency use of in vitro diagnostic tests for detection of SARS-CoV-2 virus and/or diagnosis of COVID-19 infection under section 564(b)(1) of the Act, 21 U.S.C. 528UXL-2(G)(4), unless the authorization is terminated or revoked sooner. When diagnostic testing is negative, the possibility of a false negative result should be considered in the context of a patient's recent exposures and the presence of clinical signs and symptoms consistent with COVID-19. An individual without symptoms of COVID-19 and who is not shedding SARS-CoV-2 virus would expect to have  a negative (not detected) result in this assay. Performed At: [facility] [address] [place], [place] [id number] Rush Farmer MD UY:[id number]    [place]  Final    Comment:  Performed at [facility], [address] [place], [place] [address]  Aerobic/Anaerobic  Culture (surgical/deep wound)     Status: None (Preliminary result)   Collection Time: 09/11/18  8:01 AM  Result Value Ref Range Status   Specimen Description FLUID LEFT KNEE  Final   Special Requests SWAB 1  Final   Gram Stain   Final    RARE WBC PRESENT, PREDOMINANTLY PMN NO ORGANISMS SEEN Performed at [facility], [place] [address], [place], [place] [address]    Culture PENDING  Incomplete   Report Status PENDING  Incomplete  Aerobic/Anaerobic Culture (surgical/deep wound)     Status: None (Preliminary result)   Collection Time: 09/11/18  8:02 AM  Result Value Ref Range Status   Specimen Description FLUID LEFT KNEE  Final   Special Requests SWAB 2  Final   Gram Stain   Final    RARE WBC PRESENT, PREDOMINANTLY PMN NO ORGANISMS SEEN Performed at [facility], [place] [address], [place], [place] [address]    Culture PENDING  Incomplete   Report Status PENDING  Incomplete  Aerobic/Anaerobic Culture (surgical/deep wound)     Status: None (Preliminary result)   Collection Time: 09/11/18  8:02 AM  Result Value Ref Range Status   Specimen Description FLUID LEFT KNEE  Final   Special Requests SWAB 3  Final   Gram Stain   Final    RARE WBC PRESENT, PREDOMINANTLY PMN NO ORGANISMS SEEN Performed at [facility], [place] [address], [place], [place] [address]    Culture PENDING  Incomplete   Report Status PENDING  Incomplete    IMAGING RESULTS: X-ray left knee   Hardware removal. Antibiotic spacer placement. Intra-articular antibiotic bead placement.  I have personally reviewed the films ? Impression/Recommendation ? Persistent staph aureus infection of prosthetic left knee joint. S/p explantation of hardware  with insertion of antibiotic spacer today 09/11/18 by Dr.Menz. Will give IV nafcillin Will need 6 weeks of IV?and then will follow with PO Will check ESR/CRP   HTN on amlodipine,benazepril Hyperlipidemia- on gemfibrozil, pravastatin  Bipolar disorder on   Xanax, amytriptyline, buspirone, duloxitene and zolpidem ___________________________________________________ Discussed with patient, requesting provider Note:  This document was prepared using Dragon voice recognition software and may include unintentional dictation errors.

## 2018-09-11 NOTE — Op Note (Signed)
09/11/2018  10:36 AM  PATIENT:  Nathan Chambers  53 y.o. male  PRE-OPERATIVE DIAGNOSIS: Infected left total knee  POST-OPERATIVE DIAGNOSIS: Infected left total knee  PROCEDURE:  Procedure(s): TOTAL KNEE ARTHROPLASTY - LEFT - HARDWARE REMOVAL WITH INSERTION OF ANTIBIOTIC SPACER (Left) root removal total knee with antibiotic cement spacer  SURGEON: Laurene Footman, MD  ASSISTANTS: Rachelle Hora, PA-C  ANESTHESIA:   general  EBL:  Total I/O In: 1300 [I.V.:1300] Out: 400 [Urine:100; Blood:300]  BLOOD ADMINISTERED:none  DRAINS: none   LOCAL MEDICATIONS USED:  NONE  SPECIMEN:  Source of Specimen:  Culture of synovial fluid x3  DISPOSITION OF SPECIMEN:  Microbiology  COUNTS:  YES  TOURNIQUET:   Total Tourniquet Time Documented: Thigh (Left) - 113 minutes Total: Thigh (Left) - 113 minutes   IMPLANTS: Cement spacer with vancomycin some cement  DICTATION: .Dragon Dictation patient was brought to the operating room and after adequate general anesthesia was obtained the left leg was prepped and draped in the usual sterile fashion.  A tourniquet was applied the upper thigh.  After patient identification and timeout procedures were completed the prior skin incision was incised with elliptical incision more proximally where it was wide.  There is quite a bit of edema present.  Arthrotomy was then carried out and multiple cultures obtained with gross pus present.  Following this antibiotics were given and tourniquet raised.  Extensive sub-deep scar tissue was excised along with debridement of sent infected synovium until the implant was well exposed.  The polyethylene component was removed first first removing the setscrew then working around the femur the cement metal interface was broken apart and the implant came off without bone loss.  I doubt that was then done on the tibia as well.  A saw was used to remove the bone from the ends of the flat surface of the femur and the proximal tibia  with the cement revision instruments used to get the cement out of the tibial canal.  The knee was then irrigated with pulsatile lavage 3 L and small bits of remaining cement were removed along with the patella next versa jet was used to debride bony surfaces as well as this the capsule to get fresh tissue present.  At this point the debridement as well to be adequate and a repeat pulse lavage was carried out with additional 3 L a cement minute mold for the distal femur was made and laid up against the femur after it was set the mold was removed with cement left in place the tibia cement was then made as well and inserted as a spacer following this the tourniquet was let down antibiotic beads were placed in the gutters medially and laterally as well as the suprapatellar pouch with vancomycin with stimulant beads from [place].  The arthrotomy was then repaired using a heavy Quill with additional #2 Ethibond along the medial retinaculum to try to prevent rupture of the medial retinaculum.  The subcutaneous tissue was approximated using 2 oh Quill followed by skin staples and an incisional wound VAC  PLAN OF CARE: Admit to inpatient   PATIENT DISPOSITION:  PACU - hemodynamically stable.

## 2018-09-11 NOTE — Anesthesia Preprocedure Evaluation (Signed)
Anesthesia Evaluation  Patient identified by MRN, date of birth, ID band Patient awake    Reviewed: Allergy & Precautions, H&P , NPO status , Patient's Chart, lab work & pertinent test results, reviewed documented beta blocker date and time   History of Anesthesia Complications (+) history of anesthetic complications  Airway Mallampati: III  TM Distance: >3 FB Neck ROM: full    Dental  (+) Teeth Intact, Poor Dentition, Chipped, Dental Advidsory Given   Pulmonary sleep apnea ,    Pulmonary exam normal        Cardiovascular Exercise Tolerance: Good hypertension, On Medications negative cardio ROS Normal cardiovascular exam Rhythm:regular Rate:Normal     Neuro/Psych  Headaches, Anxiety Bipolar Disorder  Neuromuscular disease negative neurological ROS  negative psych ROS   GI/Hepatic negative GI ROS, Neg liver ROS, GERD  ,  Endo/Other  negative endocrine ROS  Renal/GU Renal diseasenegative Renal ROS  negative genitourinary   Musculoskeletal   Abdominal   Peds  Hematology negative hematology ROS (+) HIV,   Anesthesia Other Findings Past Medical History: No date: Anxiety No date: Arthritis     Comment:  knees, hands No date: Bipolar disorder (HCC) No date: Chronic kidney disease     Comment:  arf 2014 due to dehydration No date: Complication of anesthesia     Comment:  woke up during last knee surgery feb 2020 No date: GERD (gastroesophageal reflux disease)     Comment:  rare no meds No date: Headache No date: Hemorrhoids No date: Hyperlipidemia No date: Hypertension No date: LFT elevation No date: NAFLD (nonalcoholic fatty liver disease) Past Surgical History: 10/30/2015: ACHILLES TENDON SURGERY; Right     Comment:  Procedure: ACHILLES TENDON REPAIR/ HEGLAND OSTECTOMY;                Surgeon: [name], DPM;  Location: [facility];                Service: Podiatry;  Laterality: Right; No date:  COLONOSCOPY 04/07/2017: COLONOSCOPY WITH PROPOFOL; N/A     Comment:  Procedure: COLONOSCOPY WITH PROPOFOL;  Surgeon: [place],               Benay Pike, MD;  Location: [facility];  Service:               Gastroenterology;  Laterality: N/A; No date: FRACTURE SURGERY; Left     Comment:  MIDDLE FINGER No date: HERNIA REPAIR 12/08/2017: I&D KNEE WITH POLY EXCHANGE; Left     Comment:  Procedure: IRRIGATION AND DEBRIDEMENT KNEE WITH POLY               EXCHANGE;  Surgeon: Dereck Leep, MD;  Location: [facility]               ORS;  Service: Orthopedics;  Laterality: Left; 2017: JOINT REPLACEMENT; Bilateral     Comment:  bilateral knees 12/08/2017: KNEE ARTHROTOMY; Left     Comment:  Procedure: KNEE ARTHROTOMY, I&D, [place] OUT, POLY               EXCHANGE;  Surgeon: Dereck Leep, MD;  Location: [facility]               ORS;  Service: Orthopedics;  Laterality: Left; No date: LIPOMA EXCISION 04/19/2016: TOTAL KNEE ARTHROPLASTY; Bilateral     Comment:  Procedure: TOTAL KNEE BILATERAL;  Surgeon: Hessie Knows,              MD;  Location: [facility] OR[facility];  Service:  Orthopedics;                Laterality: Bilateral; 06/11/2018: TOTAL KNEE REVISION; Left     Comment:  Procedure: INCISION AND DRAINAGE OF LEFT KNEE, POLY               EXCHANGE;  Surgeon: Hessie Knows, MD;  Location: [facility]               ORS;  Service: Orthopedics;  Laterality: Left; No date: VASECTOMY   Reproductive/Obstetrics negative OB ROS                             Anesthesia Physical Anesthesia Plan  ASA: III  Anesthesia Plan: General ETT   Post-op Pain Management:    Induction:   PONV Risk Score and Plan: 3  Airway Management Planned:   Additional Equipment:   Intra-op Plan:   Post-operative Plan:   Informed Consent: I have reviewed the patients History and Physical, chart, labs and discussed the procedure including the risks, benefits and alternatives for the proposed anesthesia with the patient or  authorized representative who has indicated his/her understanding and acceptance.     Dental Advisory Given  Plan Discussed with: CRNA  Anesthesia Plan Comments:         Anesthesia Quick Evaluation

## 2018-09-11 NOTE — H&P (Signed)
Reviewed paper H+P, will be scanned into chart. No changes noted.  

## 2018-09-11 NOTE — Anesthesia Post-op Follow-up Note (Signed)
Anesthesia QCDR form completed.        

## 2018-09-11 NOTE — Anesthesia Procedure Notes (Signed)
Procedure Name: Intubation Date/Time: 09/11/2018 7:30 AM Performed by: Lowry Bowl, CRNA Pre-anesthesia Checklist: Patient identified, Emergency Drugs available, Suction available, Patient being monitored and Timeout performed Patient Re-evaluated:Patient Re-evaluated prior to induction Oxygen Delivery Method: Circle system utilized Preoxygenation: Pre-oxygenation with 100% oxygen Induction Type: IV induction, Cricoid Pressure applied and Rapid sequence Laryngoscope Size: 4 and McGraph Grade View: Grade II Tube type: Oral Tube size: 7.5 mm Number of attempts: 1 Airway Equipment and Method: Stylet and Video-laryngoscopy Placement Confirmation: ETT inserted through vocal cords under direct vision,  positive ETCO2 and breath sounds checked- equal and bilateral Secured at: 23 cm Tube secured with: Tape Dental Injury: Teeth and Oropharynx as per pre-operative assessment  Difficulty Due To: Difficulty was anticipated Comments: RSI - pt not ventilated during induction

## 2018-09-12 ENCOUNTER — Encounter: Payer: Self-pay | Admitting: Orthopedic Surgery

## 2018-09-12 DIAGNOSIS — M00062 Staphylococcal arthritis, left knee: Secondary | ICD-10-CM

## 2018-09-12 DIAGNOSIS — D649 Anemia, unspecified: Secondary | ICD-10-CM

## 2018-09-12 DIAGNOSIS — T8454XA Infection and inflammatory reaction due to internal left knee prosthesis, initial encounter: Principal | ICD-10-CM

## 2018-09-12 LAB — CBC
HCT: 33.1 % — ABNORMAL LOW (ref 39.0–52.0)
Hemoglobin: 10.8 g/dL — ABNORMAL LOW (ref 13.0–17.0)
MCH: 29.6 pg (ref 26.0–34.0)
MCHC: 32.6 g/dL (ref 30.0–36.0)
MCV: 90.7 fL (ref 80.0–100.0)
Platelets: 353 10*3/uL (ref 150–400)
RBC: 3.65 MIL/uL — ABNORMAL LOW (ref 4.22–5.81)
RDW: 12.1 % (ref 11.5–15.5)
WBC: 10.6 10*3/uL — ABNORMAL HIGH (ref 4.0–10.5)
nRBC: 0 % (ref 0.0–0.2)

## 2018-09-12 LAB — TYPE AND SCREEN
ABO/RH(D): A POS
Antibody Screen: POSITIVE
Unit division: 0
Unit division: 0

## 2018-09-12 LAB — BPAM RBC
Blood Product Expiration Date: 202006062359
Blood Product Expiration Date: 202006062359
Unit Type and Rh: 6200
Unit Type and Rh: 6200

## 2018-09-12 MED ORDER — OXYCODONE HCL ER 10 MG PO T12A
20.0000 mg | EXTENDED_RELEASE_TABLET | Freq: Two times a day (BID) | ORAL | Status: DC
  Administered 2018-09-12 – 2018-09-14 (×4): 20 mg via ORAL
  Filled 2018-09-12 (×4): qty 2

## 2018-09-12 MED ORDER — CALCIUM CARBONATE ANTACID 500 MG PO CHEW
1.0000 | CHEWABLE_TABLET | Freq: Three times a day (TID) | ORAL | Status: DC | PRN
  Administered 2018-09-12 – 2018-09-13 (×4): 400 mg via ORAL
  Filled 2018-09-12 (×4): qty 2

## 2018-09-12 NOTE — Progress Notes (Signed)
Paged Dorise Hiss regarding the output of the pt wound vac. Currently he has drained about 200 mL PA made aware no orders placed at this time

## 2018-09-12 NOTE — Progress Notes (Signed)
Pt in the room with PT insisting that they ambulate him with crutches. He is complaining of heartburn when asked what he wanted for this he said something rapid acting.

## 2018-09-12 NOTE — Evaluation (Signed)
Physical Therapy Evaluation Patient Details Name: Nathan Chambers MRN: [id number] DOB: [birth year] Today's Date: 09/12/2018   History of Present Illness  Pt is a 53 y.o. male s/p L TKA HWR with insertion of antibiotic spacer 09/11/18.  PMH includes sleep apnea, htn, HA's, anxiety bipolar disorder, (+) HIV, B TKR 04/09/2016, L total knee revision (I&D with polyexchange) 06/11/18, and h/o multiple L knee surgeries.  Clinical Impression  Prior to hospital admission, pt was ambulatory (with SPC vs RW depending on how pt was feeling).  Pt lives with his wife and daughter in 1 level home with 2-3 steps to enter (no railing).  Currently pt is min assist semi-supine to sit (increased time/effort to perform); 2 assist to stand from elevated bed with RW; and CGA to min assist x2 to ambulate a few feet bed to recliner with RW.  Intermittent assist required to maintain L LE WB'ing precautions.  Pain 6/10 L knee at rest beginning/end of session but increased to "12/10" with activity.  Pt talkative during session and specific on how he felt things should be done (requiring extra time for activities for communication and safety).  Pt would benefit from skilled PT to address noted impairments and functional limitations (see below for any additional details).  Upon hospital discharge, pt would benefit from STR pending pt's progress.    Follow Up Recommendations SNF    Equipment Recommendations  Wheelchair (measurements PT);Wheelchair cushion (measurements PT)(pt has own RW and BSC at home)    Recommendations for Other Services       Precautions / Restrictions Precautions Precautions: Fall;Knee Precaution Booklet Issued: No Restrictions Weight Bearing Restrictions: Yes LLE Weight Bearing: Touchdown weight bearing Other Position/Activity Restrictions: Per MD Rudene Christians: TTWB; no bracing required; can work on ROM      Mobility  Bed Mobility Overal bed mobility: Needs Assistance Bed Mobility: Supine to Sit      Supine to sit: Min assist;HOB elevated     General bed mobility comments: assist for L LE; heavy use of bed rails; increased effort/time to perform  Transfers Overall transfer level: Needs assistance Equipment used: Rolling walker (2 wheeled) Transfers: Sit to/from Stand Sit to Stand: Min assist;Mod assist;+2 physical assistance; elevated bed height         General transfer comment: vc's and visual demo for technique; assist to maintain L LE WB'ing precautions; assist to initiate and come to full stand from recliner  Ambulation/Gait Ambulation/Gait assistance: Min guard;Min assist;+2 physical assistance Gait Distance (Feet): 3 Feet(bed to recliner) Assistive device: Rolling walker (2 wheeled)   Gait velocity: decreased   General Gait Details: vc's for WB'ing precautions (pt able to maintain); vc's for increasing UE support through RW to facilitate "ambulation"  Stairs            Wheelchair Mobility    Modified Rankin (Stroke Patients Only)       Balance Overall balance assessment: Needs assistance Sitting-balance support: No upper extremity supported;Feet supported Sitting balance-Leahy Scale: Good Sitting balance - Comments: steady sitting reaching within BOS   Standing balance support: Bilateral upper extremity supported Standing balance-Leahy Scale: Poor Standing balance comment: pt requiring B UE support on RW for static standing balance                             Pertinent Vitals/Pain Pain Assessment: 0-10 Pain Score: 6  Pain Location: L knee Pain Descriptors / Indicators: Sore;Tender;Aching Pain Intervention(s): Limited activity within patient's tolerance;Monitored  during session;Premedicated before session;Repositioned;Ice applied  Vitals (HR and O2 on room air) stable and WFL throughout treatment session.    Home Living Family/patient expects to be discharged to:: Private residence Living Arrangements: Spouse/significant  other;Children(daughter) Available Help at Discharge: Family Type of Home: House Home Access: Stairs to enter Entrance Stairs-Rails: None Entrance Stairs-Number of Steps: 2-3 Home Layout: One level Home Equipment: Environmental consultant - 2 wheels;Shower seat;Toilet riser;Bedside commode      Prior Function Level of Independence: Independent with assistive device(s)         Comments: Ambulatory with SPC vs walker depending on how he was doing; last couple weeks very limited mobility d/t L knee issues     Hand Dominance        Extremity/Trunk Assessment   Upper Extremity Assessment Upper Extremity Assessment: Overall WFL for tasks assessed    Lower Extremity Assessment Lower Extremity Assessment: RLE deficits/detail;LLE deficits/detail RLE Deficits / Details: strength and ROM WFL LLE Deficits / Details: L knee extension grossly 20 degrees short of neutral resting in bed; L knee flexion not formally tested (flexion ROM did increase with mobility per pt tolerance)(pt preferring not to perform formal L knee ROM d/t pain and "crunching" feeling/sound) LLE: Unable to fully assess due to pain    Cervical / Trunk Assessment Cervical / Trunk Assessment: Normal  Communication   Communication: No difficulties  Cognition Arousal/Alertness: Awake/alert Behavior During Therapy: WFL for tasks assessed/performed Overall Cognitive Status: Within Functional Limits for tasks assessed                                        General Comments General comments (skin integrity, edema, etc.): L knee wound vac intact beginning/end of session.  Nursing cleared pt for participation in physical therapy.  Pt agreeable to PT session after initial education on what role of PT was in his care.  Pt requesting to donn his own personal underwear and to change into different hospital gown (therapist assisted pt with this).  Pt reporting L knee crunches with ROM (MD Menz reports knee crunches because of  cement spacer, that is expected; pt educated on this).  Private secure message sent to MD Rudene Christians to clarify restrictions/precautions that was noted above.  Pt initially stating he couldn't walk with tall RW that therapist brought into room (after using it to get to chair) or the bariatric RW that therapist replaced it with; therapist educated pt of option for family to drop off personal walker for pt to use but pt declined and stated he would use hospital bariatric RW.    Exercises     Assessment/Plan    PT Assessment Patient needs continued PT services  PT Problem List Decreased strength;Decreased range of motion;Decreased activity tolerance;Decreased balance;Decreased mobility;Decreased knowledge of use of DME;Decreased knowledge of precautions;Pain;Decreased skin integrity       PT Treatment Interventions DME instruction;Gait training;Stair training;Functional mobility training;Therapeutic activities;Therapeutic exercise;Balance training;Patient/family education;Wheelchair mobility training    PT Goals (Current goals can be found in the Care Plan section)  Acute Rehab PT Goals Patient Stated Goal: to go home PT Goal Formulation: With patient Time For Goal Achievement: 09/26/18 Potential to Achieve Goals: Fair    Frequency BID   Barriers to discharge        Co-evaluation               AM-PAC PT "6 Clicks" Mobility  Outcome Measure  Help needed turning from your back to your side while in a flat bed without using bedrails?: A Little Help needed moving from lying on your back to sitting on the side of a flat bed without using bedrails?: A Little Help needed moving to and from a bed to a chair (including a wheelchair)?: A Lot Help needed standing up from a chair using your arms (e.g., wheelchair or bedside chair)?: Total Help needed to walk in hospital room?: Total Help needed climbing 3-5 steps with a railing? : Total 6 Click Score: 11    End of Session Equipment Utilized  During Treatment: Gait belt Activity Tolerance: Patient limited by pain Patient left: in chair;with call bell/phone within reach;with chair alarm set;Other (comment)(B heels floating; nurse cleared pt to not have SCD's on in chair) Nurse Communication: Mobility status;Precautions;Weight bearing status;Other (comment)(Nurse present when pt discussing pain level) PT Visit Diagnosis: Other abnormalities of gait and mobility (R26.89);Muscle weakness (generalized) (M62.81);Difficulty in walking, not elsewhere classified (R26.2);Pain Pain - Right/Left: Left Pain - part of body: Knee    Time: 2694-8546 PT Time Calculation (min) (ACUTE ONLY): 70 min   Charges:   PT Evaluation $PT Eval Low Complexity: 1 Low PT Treatments $Therapeutic Activity: 68-82 mins       Leitha Bleak, PT 09/12/18, 3:20 PM [phone]

## 2018-09-12 NOTE — Progress Notes (Signed)
   Subjective: 1 Day Post-Op Procedure(s) (LRB): TOTAL KNEE ARTHROPLASTY - LEFT - HARDWARE REMOVAL WITH INSERTION OF ANTIBIOTIC SPACER (Left) Patient reports pain as moderate.   Patient is well, and has had no acute complaints or problems Denies any CP, SOB, ABD pain. We will continue therapy today.   Objective: Vital signs in last 24 hours: Temp:  [97.2 F (36.2 C)-98.8 F (37.1 C)] 98.7 F (37.1 C) (05/13 0820) Pulse Rate:  [68-108] 77 (05/13 0820) Resp:  [12-22] 18 (05/13 0820) BP: (122-169)/(75-149) 135/75 (05/13 0820) SpO2:  [92 %-100 %] 100 % (05/13 0820) FiO2 (%):  [28 %] 28 % (05/12 1226) Weight:  [143.3 kg] 143.3 kg (05/12 1325)  Intake/Output from previous day: 05/12 0701 - 05/13 0700 In: 4106.7 [P.O.:700; I.V.:3006.7; IV Piggyback:400] Out: 5380 [Urine:5080; Blood:300] Intake/Output this shift: No intake/output data recorded.  Recent Labs    09/11/18 1243 09/12/18 0622  HGB 12.3* 10.8*   Recent Labs    09/11/18 1243 09/12/18 0622  WBC 20.0* 10.6*  RBC 4.16* 3.65*  HCT 37.2* 33.1*  PLT 443* 353   Recent Labs    09/11/18 1243  CREATININE 0.80   No results for input(s): LABPT, INR in the last 72 hours.  EXAM General - Patient is Alert, Appropriate and Oriented Extremity - Neurovascular intact Sensation intact distally Intact pulses distally Dorsiflexion/Plantar flexion intact No cellulitis present Compartment soft Dressing - dressing C/D/I and prevena intact with 150 cc drainage in canister Motor Function - intact, moving foot and toes well on exam.   Past Medical History:  Diagnosis Date  . Anxiety   . Arthritis    knees, hands  . Bipolar disorder ([place])   . Chronic kidney disease    arf 2014 due to dehydration  . Complication of anesthesia    woke up during last knee surgery feb 2020  . GERD (gastroesophageal reflux disease)    rare no meds  . Headache   . Hemorrhoids   . Hyperlipidemia   . Hypertension   . LFT elevation   .  NAFLD (nonalcoholic fatty liver disease)     Assessment/Plan:   1 Day Post-Op Procedure(s) (LRB): TOTAL KNEE ARTHROPLASTY - LEFT - HARDWARE REMOVAL WITH INSERTION OF ANTIBIOTIC SPACER (Left) Active Problems:   Infection of total left knee replacement (HCC)  Estimated body mass index is 39.49 kg/m as calculated from the following:   Height as of this encounter: 6\' 3"  (1.905 m).   Weight as of this encounter: 143.3 kg. Advance diet Up with therapy, NWB left lower extremity Continue with IV abx. ID recommends 6 weeks of  Nafcillin Intraoperative cultures pending Recheck labs in the am   DVT Prophylaxis - Lovenox and TED hose    T. Rachelle Hora, PA-C [place] 09/12/2018, 8:25 AM

## 2018-09-12 NOTE — Progress Notes (Signed)
Physical Therapy Treatment Patient Details Name: Nathan Chambers MRN: [id number] DOB: 07/19/1965 Today's Date: 09/12/2018    History of Present Illness Pt is a 53 y.o. male s/p L TKA HWR with insertion of antibiotic spacer 09/11/18.  PMH includes sleep apnea, htn, HA's, anxiety bipolar disorder, (+) HIV, B TKR 04/09/2016, L total knee revision (I&D with polyexchange) 06/11/18, and h/o multiple L knee surgeries.    PT Comments    Upon PT entering pt's room, pt reporting he decided axillary crutches would be better than the walker because he could "walk" with crutches instead of "hopping" with the walker.  Therapist discussed concerns with using axillary crutches (including balance, maintaining WB'ing status, and concern for use of axillary crutches with planned PICC line placement).  Therapist trialed axillary crutches (d/t pt feeling strongly about crutches being easier and since PICC line not placed yet) but pt required 2 assist for safety/balance and pt had difficulty advancing R LE x2 trials (pt was able to maintain L LE WB'ing status).  After trialing axillary crutches, pt agreeable to walker use instead.  Pt fatigued quickly with bariatric RW use limiting ambulation distance to about 10 feet (pt noted with increasing difficulty clearing R foot from floor to advance so stopped d/t safety concerns).  Pt then asking to use toilet so pt brought into bathroom via recliner and assisted to [employer] over toilet in bathroom and then back to recliner.  Recliner then brought back next to bed and pt performed stand pivot recliner to bed with RW.  Pt requiring 2 assist with mobility plus 3rd assist for chair follow/wound vac management.  Intermittent assist required to maintain L LE WB'ing precautions.  Pt stated that he was not going to be able to walk and needed to be w/c level for discharge home; pt then called family member to arrange w/c (pt stating he wanted to get one this way instead of going through insurance).   Therapist asked pt regarding specifics on w/c and pt reporting it was a wider w/c but would call his wife back to clarify (PT recommended to pt anti-tippers and elevating legrests).  Pt reports he has a ramp but no one is able to put it in for him prior to discharge home.  CM and SW notified of PT recommendations, concerns, and also that pt working on arranging DME for home discharge.  If pt does choose to go home (instead of STR), pt would require 24/7 assist w/c level functional mobility and also would recommend EMS transport home d/t concern of pt being able to get up steps into home.   Follow Up Recommendations  SNF     Equipment Recommendations  Wheelchair (measurements PT);Wheelchair cushion (measurements PT)(pt has own RW and BSC at home)    Recommendations for Other Services       Precautions / Restrictions Precautions Precautions: Fall;Knee Precaution Booklet Issued: No Restrictions Weight Bearing Restrictions: Yes LLE Weight Bearing: Touchdown weight bearing Other Position/Activity Restrictions: Per MD Menz: TTWB; no bracing required; can work on ROM.  Pt prefers ROM with functional activity (instead of formal ROM ex's).    Mobility  Bed Mobility Overal bed mobility: Needs Assistance Bed Mobility: Sit to Supine     Sit to supine: Min assist;HOB elevated   General bed mobility comments: assist for L LE; heavy use of bed rails; increased effort/time to perform  Transfers Overall transfer level: Needs assistance Equipment used: Rolling walker (2 wheeled)(bariatric) Transfers: Sit to/from [facility] Sit to  Stand: Min assist;+2 physical assistance Stand pivot transfers: Min assist;+2 physical assistance       General transfer comment: assist to initiate and come to full stand from elevated recliner seat height (1st trial with B axillary crutches; 2nd trial with bathroom grab bar; 3rd trial with bariatric RW); stand pivot recliner to [facility] over toilet (pt  holding onto bathroom grab bar); stand pivot with RW recliner to bed  Ambulation/Gait Ambulation/Gait assistance: Min guard;Min assist;+2 physical assistance Gait Distance (Feet): 10 Feet Assistive device: Rolling walker (2 wheeled)(bariatric)   Gait velocity: decreased   General Gait Details: vc's for WB'ing precautions (pt able to maintain); vc's for increasing UE support through RW; difficulty advancing R LE with fatigue (decreased foot clearance)   Stairs             Wheelchair Mobility    Modified Rankin (Stroke Patients Only)       Balance Overall balance assessment: Needs assistance Sitting-balance support: No upper extremity supported;Feet supported Sitting balance-Leahy Scale: Good Sitting balance - Comments: steady sitting reaching within BOS   Standing balance support: Single extremity supported Standing balance-Leahy Scale: Poor Standing balance comment: pt requiring at least single UE support for static standing balance                            Cognition Arousal/Alertness: Awake/alert Behavior During Therapy: WFL for tasks assessed/performed Overall Cognitive Status: Within Functional Limits for tasks assessed                                        Exercises      General Comments General comments (skin integrity, edema, etc.): L knee wound vac intact beginning/end of session      Pertinent Vitals/Pain Pain Assessment: 0-10 Pain Score: 8  Pain Location: L knee Pain Descriptors / Indicators: Sore;Tender;Aching;Operative site guarding;Shooting Pain Intervention(s): Limited activity within patient's tolerance;Monitored during session;Repositioned;Premedicated before session;Patient requesting pain meds-RN notified;Ice applied    Home Living Family/patient expects to be discharged to:: Private residence Living Arrangements: Spouse/significant other;Children(daughter) Available Help at Discharge: Family Type of Home:  House Home Access: Stairs to enter Entrance Stairs-Rails: None Home Layout: One level Home Equipment: Environmental consultant - 2 wheels;Shower seat;Toilet riser;Bedside commode      Prior Function Level of Independence: Independent with assistive device(s)      Comments: Ambulatory with SPC vs walker depending on how he was doing; last couple weeks very limited mobility d/t L knee issues   PT Goals (current goals can now be found in the care plan section) Acute Rehab PT Goals Patient Stated Goal: to go home PT Goal Formulation: With patient Time For Goal Achievement: 09/26/18 Potential to Achieve Goals: Fair Progress towards PT goals: Progressing toward goals    Frequency    BID      PT Plan Current plan remains appropriate    Co-evaluation              AM-PAC PT "6 Clicks" Mobility   Outcome Measure  Help needed turning from your back to your side while in a flat bed without using bedrails?: A Little Help needed moving from lying on your back to sitting on the side of a flat bed without using bedrails?: A Little Help needed moving to and from a bed to a chair (including a wheelchair)?: A Lot Help needed standing up  from a chair using your arms (e.g., wheelchair or bedside chair)?: Total Help needed to walk in hospital room?: Total Help needed climbing 3-5 steps with a railing? : Total 6 Click Score: 11    End of Session Equipment Utilized During Treatment: Gait belt Activity Tolerance: Patient limited by pain;Patient limited by fatigue Patient left: in bed;with call bell/phone within reach;with bed alarm set;with SCD's reapplied;Other (comment)(L heel elevated via pillow (pt declined elevating R heel)) Nurse Communication: Mobility status;Precautions;Weight bearing status;Other (comment);Patient requests pain meds PT Visit Diagnosis: Other abnormalities of gait and mobility (R26.89);Muscle weakness (generalized) (M62.81);Difficulty in walking, not elsewhere classified  (R26.2);Pain Pain - Right/Left: Left Pain - part of body: Knee     Time: 4627-0350 PT Time Calculation (min) (ACUTE ONLY): 76 min  Charges:  $Gait Training: 23-37 mins $Therapeutic Activity: 38-52 mins                    Leitha Bleak, PT 09/12/18, 4:08 PM [phone]

## 2018-09-12 NOTE — Progress Notes (Signed)
Verbal order for tums 1-2 tablets PRN as needed TID per MD Rudene Christians

## 2018-09-12 NOTE — Plan of Care (Signed)

## 2018-09-12 NOTE — Progress Notes (Signed)
Spoke to Dr Rudene Christians regarding the pt pain and if it was okay to administer IVP medications on POD1 per MD Rudene Christians okay to give pt PRN IVP medication to help manage his pain

## 2018-09-12 NOTE — Progress Notes (Signed)
ID MSSA left Knee prosthetic joint infection S/p explantation of the hardware. Has spacer  Says the left leg hurts a lot It is very swollen Says he is unable to hop with walker as requested by PT and would do better with crutches he says. O/E vitals  Patient Vitals for the past 24 hrs:  BP Temp Temp src Pulse Resp SpO2  09/12/18 1613 113/82 - - 84 18 98 %  09/12/18 1518 (!) 125/57 - - - - -  09/12/18 1150 117/75 98.7 F (37.1 C) - 83 16 94 %  09/12/18 0820 135/75 98.7 F (37.1 C) - 77 18 100 %  09/12/18 0337 124/81 97.9 F (36.6 C) Oral 68 17 98 %  09/11/18 2355 122/76 97.7 F (36.5 C) Oral 72 18 97 %  09/11/18 1948 (!) 122/93 98.3 F (36.8 C) Oral 82 16 98 %  09/11/18 1935 - - - - - 97 %    Left knee swollen- as wound vac with bloody drainage  Labs CBC Latest Ref Rng & Units 09/12/2018 09/11/2018 09/06/2018  WBC 4.0 - 10.5 K/uL 10.6(H) 20.0(H) 11.8(H)  Hemoglobin 13.0 - 17.0 g/dL 10.8(L) 12.3(L) 13.3  Hematocrit 39.0 - 52.0 % 33.1(L) 37.2(L) 41.1  Platelets 150 - 400 K/uL 353 443(H) 575(H)   CMP Latest Ref Rng & Units 09/11/2018 09/06/2018 08/09/2018  Glucose 70 - 99 mg/dL - 123(H) 117(H)  BUN 6 - 20 mg/dL - 14 13  Creatinine 0.61 - 1.24 mg/dL 0.80 0.80 0.72  Sodium 135 - 145 mmol/L - 139 138  Potassium 3.5 - 5.1 mmol/L - 3.7 3.9  Chloride 98 - 111 mmol/L - 99 102  CO2 22 - 32 mmol/L - 28 24  Calcium 8.9 - 10.3 mg/dL - 9.3 9.6  Total Protein 6.5 - 8.1 g/dL - - 7.9  Total Bilirubin 0.3 - 1.2 mg/dL - - 0.5  Alkaline Phos 38 - 126 U/L - - 75  AST 15 - 41 U/L - - 32  ALT 0 - 44 U/L - - 39    MSSA left PJI knee- s/p explantation of hardware- continue Nafcillin which he will need for 6 weeks- he prefers to have continuous infusion at home with nafcillin and not Q8 cefazolin  Leucocytosis has resolved Anemia- following surgery  Dicussed the management with the patient

## 2018-09-12 NOTE — Progress Notes (Signed)
Pt NPWT canister was changed and output was documented as 425 mL

## 2018-09-12 NOTE — TOC Initial Note (Signed)
Transition of Care [facility]) - Initial/Assessment Note    Patient Details  Name: Nathan Chambers MRN: [id number] Date of Birth: [birth year]  Transition of [facility] CM/SW Contact:    Su Hilt, RN Phone Number: 09/12/2018, 8:46 AM  Clinical Narrative:                 Spoke with the patient regarding DC plans and needs. He will need IV abx for 6 weeks, Spoke to [employer] to get it set up,  Notified Jason at [employer] that the patient chooses them for Nursing to manage the PICC line, he has used [facility] in the past. The patient continues to decline DME at this time He can afford Medications and uses [facility] His PCP is Dr. Lovie Macadamia His wife and daughter provide transportation  Expected Discharge Plan: [place] Barriers to Discharge: Continued Medical Work up   Patient Goals and CMS Choice        Expected Discharge Plan and Services Expected Discharge Plan: [place]   Discharge Planning Services: CM Consult   Living arrangements for the past 2 months: Single Family Home                                      Prior Living Arrangements/Services Living arrangements for the past 2 months: Single Family Home Lives with:: Spouse              Current home services: DME(rolling walker, shower chair)    Activities of Daily Living Home Assistive Devices/Equipment: Eyeglasses, Radio producer (specify quad or straight) ADL Screening (condition at time of admission) Patient's cognitive ability adequate to safely complete daily activities?: Yes Is the patient deaf or have difficulty hearing?: No Does the patient have difficulty seeing, even when wearing glasses/contacts?: No Does the patient have difficulty concentrating, remembering, or making decisions?: No Patient able to express need for assistance with ADLs?: Yes Does the patient have difficulty dressing or bathing?: No Independently performs ADLs?: Yes  (appropriate for developmental age) Does the patient have difficulty walking or climbing stairs?: Yes Weakness of Legs: Left Weakness of Arms/Hands: None  Permission Sought/Granted                  Emotional Assessment       Orientation: : Oriented to Self, Oriented to Situation, Oriented to  Time Alcohol / Substance Use: Never Used Psych Involvement: No (comment)  Admission diagnosis:  ACUTE PAIN OF LEFT KNEE Patient Active Problem List   Diagnosis Date Noted  . Chronic radicular lumbar pain 08/28/2018  . History of bilateral knee replacement 08/28/2018  . Lumbar radicular pain 08/28/2018  . Lumbar facet arthropathy 08/28/2018  . Spinal stenosis of lumbar region without neurogenic claudication 08/28/2018  . Lumbar spondylosis 08/28/2018  . Neuroforaminal stenosis of lumbar spine 08/28/2018  . Sensory polyneuropathy 08/28/2018  . Infection of total left knee replacement ([place]) 06/10/2018  . Arthritis, septic, knee ([place]) 12/08/2017  . Hyperglycemia, unspecified 02/16/2016  . Anxiety 01/26/2015  . Hyperlipidemia 01/26/2015  . Hypertension 01/26/2015   PCP:  Juluis Pitch, MD Pharmacy:   [facility] [phone] [place] [address] Phone: [phone] Fax: [phone]     Social Determinants of Health (SDOH) Interventions    Readmission  Risk Interventions No flowsheet data found.

## 2018-09-13 ENCOUNTER — Inpatient Hospital Stay: Payer: Self-pay

## 2018-09-13 LAB — CBC
HCT: 33.7 % — ABNORMAL LOW (ref 39.0–52.0)
Hemoglobin: 11 g/dL — ABNORMAL LOW (ref 13.0–17.0)
MCH: 29.4 pg (ref 26.0–34.0)
MCHC: 32.6 g/dL (ref 30.0–36.0)
MCV: 90.1 fL (ref 80.0–100.0)
Platelets: 330 10*3/uL (ref 150–400)
RBC: 3.74 MIL/uL — ABNORMAL LOW (ref 4.22–5.81)
RDW: 12.3 % (ref 11.5–15.5)
WBC: 10.9 10*3/uL — ABNORMAL HIGH (ref 4.0–10.5)
nRBC: 0 % (ref 0.0–0.2)

## 2018-09-13 LAB — BASIC METABOLIC PANEL
Anion gap: 8 (ref 5–15)
BUN: 11 mg/dL (ref 6–20)
CO2: 29 mmol/L (ref 22–32)
Calcium: 8.5 mg/dL — ABNORMAL LOW (ref 8.9–10.3)
Chloride: 100 mmol/L (ref 98–111)
Creatinine, Ser: 0.89 mg/dL (ref 0.61–1.24)
GFR calc Af Amer: >60 mL/min (ref >60)
GFR calc non Af Amer: >60 mL/min (ref >60)
Glucose, Bld: 143 mg/dL — ABNORMAL HIGH (ref 70–99)
Potassium: 3.1 mmol/L — ABNORMAL LOW (ref 3.5–5.1)
Sodium: 137 mmol/L (ref 135–145)

## 2018-09-13 MED ORDER — SODIUM CHLORIDE 0.9 % IV SOLN: 2.0000 g | INTRAVENOUS | 0 refills | Status: DC

## 2018-09-13 MED ORDER — ENOXAPARIN SODIUM 40 MG/0.4ML ~~LOC~~ SOLN: 40.0000 mg | SUBCUTANEOUS | 0 refills | Status: DC

## 2018-09-13 MED ORDER — POTASSIUM CHLORIDE 20 MEQ PO PACK
20.0000 meq | PACK | Freq: Three times a day (TID) | ORAL | Status: DC
  Administered 2018-09-13: 20 meq via ORAL
  Filled 2018-09-13 (×2): qty 1

## 2018-09-13 MED ORDER — SODIUM CHLORIDE 0.9% FLUSH
10.0000 mL | Freq: Two times a day (BID) | INTRAVENOUS | Status: DC
  Administered 2018-09-13 – 2018-09-14 (×3): 10 mL

## 2018-09-13 MED ORDER — OXYCODONE HCL 10 MG PO TABS: 10.0000 mg | ORAL_TABLET | ORAL | 0 refills | Status: DC | PRN

## 2018-09-13 MED ORDER — POTASSIUM CHLORIDE CRYS ER 20 MEQ PO TBCR
20.0000 meq | EXTENDED_RELEASE_TABLET | Freq: Three times a day (TID) | ORAL | Status: DC
  Administered 2018-09-13 – 2018-09-14 (×3): 20 meq via ORAL
  Filled 2018-09-13 (×3): qty 1

## 2018-09-13 MED ORDER — DOCUSATE SODIUM 100 MG PO CAPS: 100.0000 mg | ORAL_CAPSULE | Freq: Two times a day (BID) | ORAL | 0 refills | Status: DC

## 2018-09-13 MED ORDER — ACETAMINOPHEN 325 MG PO TABS: 325.0000 mg | ORAL_TABLET | Freq: Four times a day (QID) | ORAL | Status: DC | PRN

## 2018-09-13 MED ORDER — SODIUM CHLORIDE 0.9% FLUSH: 10.0000 mL | INTRAVENOUS | Status: DC | PRN

## 2018-09-13 MED ORDER — NAFCILLIN IV (FOR PTA / DISCHARGE USE ONLY): 12.0000 g | INTRAVENOUS | 0 refills | Status: AC

## 2018-09-13 MED ORDER — OXYCODONE HCL ER 20 MG PO T12A: 20.0000 mg | EXTENDED_RELEASE_TABLET | Freq: Two times a day (BID) | ORAL | 0 refills | Status: DC

## 2018-09-13 NOTE — Progress Notes (Signed)
Physical Therapy Treatment Patient Details Name: Nathan Chambers MRN: [id number] DOB: 05/28/1965 Today's Date: 09/13/2018    History of Present Illness Pt is a 53 y.o. male s/p L TKA HWR with insertion of antibiotic spacer 09/11/18.  PMH includes sleep apnea, htn, HA's, anxiety bipolar disorder, (+) HIV, B TKR 04/09/2016, L total knee revision (I&D with polyexchange) 06/11/18, and h/o multiple L knee surgeries.    PT Comments    Pt did better with mobility and PT session in general today.  He is still limited with functional mobility, but did manage 3 bouts of progressively improved gait with increased distance each time.  He did better without shoe on R side (his skateboard shoes were excessively "grippy") and was better able to manage moving it and  acceptance of weight with grippy socks.  He responded well to cues to take smaller more controlled steps with improved safety and less quick to fatigue (though each time his HR increased to ~130 relatively quickly, O2 remains stable).  Pt not thrilled about using BSC here and at home, did did show good relative safety with stand-pivot transfer on this today and acknowledges that depending on accessibility of bathroom at home he may need to use it.  Pt still has some safety issues related to going home, but much better today: increased safety and distance with ambulation, ability to maintain TTWBing and improved independence with transfers.  Pt still needing a lot of cuing, encouragement, explanation and education but ultimately did better than expected and PT is now much more comfortable with the idea of pt being home and remaining safe and within precautions.    Follow Up Recommendations  Supervision/Assistance - 24 hour;Home health PT     Equipment Recommendations  (Pt reports he has w/c at home, ramp being set up today?)    Recommendations for Other Services       Precautions / Restrictions Precautions Precautions: Fall;Knee Restrictions LLE  Weight Bearing: Touchdown weight bearing    Mobility  Bed Mobility Overal bed mobility: Modified Independent Bed Mobility: Supine to Sit     Supine to sit: Supervision     General bed mobility comments: Pt able to get himself to EOB w/o physical assist  Transfers Overall transfer level: Needs assistance Equipment used: Rolling walker (2 wheeled) Transfers: Sit to/from [facility] Sit to Stand: Min guard Stand pivot transfers: Min guard       General transfer comment: Raised surfaces (bed, chair, BSC) with all transfers but pt was able to rise each time (at least 5 sit-to-stands t/o session) with heavy reliance on UEs but did not require direct phyiscal assist and was able to maintain TTWBing.  Transferred safely to/from commode with grab bar and no AD.  Ambulation/Gait Ambulation/Gait assistance: Min guard Gait Distance (Feet): 30 Feet Assistive device: Rolling walker (2 wheeled)(bariatric)       General Gait Details: 15 ft, 18 ft, 30 ft with seated rest breaks and much cuing during and post each effort.  Pt actually improved significantly with each bout and was better able to manage speed, effort and safety with successive rounds.  His HR rose to ~130 with significant fatigue each time but he did not have any LOBs or overt safety issues that were particularly concerning.     Stairs             Wheelchair Mobility    Modified Rankin (Stroke Patients Only)       Balance Overall balance assessment: Needs  assistance Sitting-balance support: No upper extremity supported;Feet supported Sitting balance-Leahy Scale: Good Sitting balance - Comments: steady sitting reaching within BOS     Standing balance-Leahy Scale: Fair Standing balance comment: Pt did relatively well today with stability in standing with AD.  He needed constant cuing to stay aware of situation, but was able to maintain TTWBing consistently and did not have LOBs, etc                             Cognition Arousal/Alertness: Awake/alert Behavior During Therapy: WFL for tasks assessed/performed;Impulsive Overall Cognitive Status: Within Functional Limits for tasks assessed                                        Exercises      General Comments        Pertinent Vitals/Pain Pain Assessment: 0-10 Pain Score: 8  Pain Location: L knee    Home Living                      Prior Function            PT Goals (current goals can now be found in the care plan section) Progress towards PT goals: Progressing toward goals    Frequency    BID      PT Plan Discharge plan needs to be updated    Co-evaluation              AM-PAC PT "6 Clicks" Mobility   Outcome Measure  Help needed turning from your back to your side while in a flat bed without using bedrails?: None Help needed moving from lying on your back to sitting on the side of a flat bed without using bedrails?: None Help needed moving to and from a bed to a chair (including a wheelchair)?: A Little Help needed standing up from a chair using your arms (e.g., wheelchair or bedside chair)?: A Little Help needed to walk in hospital room?: A Little Help needed climbing 3-5 steps with a railing? : Total 6 Click Score: 18    End of Session Equipment Utilized During Treatment: Gait belt Activity Tolerance: Patient limited by pain;Patient limited by fatigue Patient left: with chair alarm set;with call bell/phone within reach;with nursing/sitter in room Nurse Communication: Mobility status PT Visit Diagnosis: Other abnormalities of gait and mobility (R26.89);Muscle weakness (generalized) (M62.81);Difficulty in walking, not elsewhere classified (R26.2);Pain Pain - Right/Left: Left Pain - part of body: Knee     Time: 1540-0867 PT Time Calculation (min) (ACUTE ONLY): 74 min  Charges:  $Gait Training: 38-52 mins $Therapeutic Activity: 23-37 mins                      Kreg Shropshire, DPT 09/13/2018, 10:27 AM

## 2018-09-13 NOTE — Progress Notes (Signed)
Physical Therapy Treatment Patient Details Name: Nathan Chambers MRN: [id number] DOB: 1966-02-05 Today's Date: 09/13/2018    History of Present Illness Pt is a 53 y.o. male s/p L TKA HWR with insertion of antibiotic spacer 09/11/18.  PMH includes sleep apnea, htn, HA's, anxiety bipolar disorder, (+) HIV, B TKR 04/09/2016, L total knee revision (I&D with polyexchange) 06/11/18, and h/o multiple L knee surgeries.    PT Comments    Pt was able to manage a relatively long bout of ambulation this afternoon with considerable fatigue but ability to maintain TTWBing and more consistent "cadence" without unsafe speed or attempts at taking too long steps.  Pt's HR up to 140s with the effort, and he had considerable fatigue but he ultimately did show ability to safely manage in-home distances.  Pt will still need w/c for any "prolonged" mobility needs but is growing in confidence with ability to manage safely in the home.  He continues to have expected rubbing crepitus with essentially any movement in the knee (even just 1-2 degrees during quad sets, etc).  He lacked TKE, but was actively able to get to within 5 deg of full extension, pt in ~80 deg of flexion sitting in recliner but overpressure ROM deferred.     Follow Up Recommendations  Supervision/Assistance - 24 hour;Home health PT     Equipment Recommendations  (Pt states that family is bringing w/c today, working on ramp)    Recommendations for Other Services       Precautions / Restrictions Precautions Precautions: Fall;Knee Precaution Booklet Issued: No Restrictions LLE Weight Bearing: Touchdown weight bearing    Mobility  Bed Mobility Overal bed mobility: Modified Independent Bed Mobility: Sit to Supine     Supine to sit: Supervision Sit to supine: Min assist   General bed mobility comments: Pt showed good effort in getting into bed, attempted to hook L LE with R but was unable to lift LEs into bed.  Was able to hold socks and lift  LEs with UE use.  Once foot in bed he needed assist to re-lift LE to get it over into bed  Transfers Overall transfer level: Needs assistance Equipment used: Rolling walker (2 wheeled) Transfers: Sit to/from Stand Sit to Stand: Supervision         General transfer comment: Pt was able to rise w/o assist and was good about maintaining TTWBing on L.  Pt reliant on UEs/walker once up but safe and relatively confident.   Ambulation/Gait Ambulation/Gait assistance: Min guard Gait Distance (Feet): 70 Feet Assistive device: Rolling walker (2 wheeled)       General Gait Details: 70 ft   Stairs             Wheelchair Mobility    Modified Rankin (Stroke Patients Only)       Balance Overall balance assessment: Needs assistance Sitting-balance support: No upper extremity supported;Feet supported Sitting balance-Leahy Scale: Good       Standing balance-Leahy Scale: Good Standing balance comment: Pt was able to maintain standing using walker, and able to maintain TTWBing with relative ease                            Cognition Arousal/Alertness: Awake/alert Behavior During Therapy: [facility] for tasks assessed/performed;Impulsive Overall Cognitive Status: Within Functional Limits for tasks assessed  Exercises General Exercises - Lower Extremity Ankle Circles/Pumps: AROM;10 reps Quad Sets: AROM;10 reps(significant friction rub crepitus with ANY movement) Heel Slides: AAROM;5 reps(gentle limited ROM knee flx/ext in ~5 to 30 ROM) Hip ABduction/ADduction: Strengthening;10 reps(supported distal LE to avoid varus/valgus) Straight Leg Raises: (PROM only )    General Comments        Pertinent Vitals/Pain Pain Score: 9  Pain Location: L knee    Home Living                      Prior Function            PT Goals (current goals can now be found in the care plan section) Progress towards PT goals:  Progressing toward goals    Frequency    BID      PT Plan Current plan remains appropriate    Co-evaluation              AM-PAC PT "6 Clicks" Mobility   Outcome Measure  Help needed turning from your back to your side while in a flat bed without using bedrails?: None Help needed moving from lying on your back to sitting on the side of a flat bed without using bedrails?: None Help needed moving to and from a bed to a chair (including a wheelchair)?: A Little Help needed standing up from a chair using your arms (e.g., wheelchair or bedside chair)?: A Little Help needed to walk in hospital room?: A Little Help needed climbing 3-5 steps with a railing? : Total 6 Click Score: 18    End of Session Equipment Utilized During Treatment: Gait belt Activity Tolerance: Patient limited by pain;Patient limited by fatigue Patient left: with chair alarm set;with call bell/phone within reach;with nursing/sitter in room   PT Visit Diagnosis: Other abnormalities of gait and mobility (R26.89);Muscle weakness (generalized) (M62.81);Difficulty in walking, not elsewhere classified (R26.2);Pain Pain - Right/Left: Left Pain - part of body: Knee     Time: 2563-8937 PT Time Calculation (min) (ACUTE ONLY): 43 min  Charges:  $Gait Training: 8-22 mins $Therapeutic Exercise: 8-22 mins $Therapeutic Activity: 8-22 mins                     Kreg Shropshire, DPT 09/13/2018, 2:47 PM

## 2018-09-13 NOTE — Progress Notes (Signed)
Per MD Rudene Christians okay to change pt from 20 MEq packet to 20 MEq tablet TID since pt is unable to tolerate the taste of the packet preparation of this medication.

## 2018-09-13 NOTE — Treatment Plan (Signed)
Diagnosis: MSSA left knee Prosthetic Joint infection- s/p removal of hardware on 09/11/18   Baseline Creatinine 0.89  Culture Result: MSSA ( staph aureus)  Allergies  Allergen Reactions  . Etodolac Itching    OPAT Orders Discharge antibiotics: Nafcillin 2 grams IV every 4 hours- will have to be given as a 24 hour infusion) for 6 weeks End Date: 10/23/18  [facility] Per Protocol:  Labs weekly on Monday while on IV antibiotics: _X_ CBC with differential _X_ CMP Labs once every 2 weeks on a Monday while on antibioitc _X_ CRP _X_ ESR   X__ Please pull PIC at completion of IV antibiotics   Fax weekly labs to [phone] promptly  Clinic Follow Up Appt: 3 weeks Call [phone] to make appt

## 2018-09-13 NOTE — Progress Notes (Signed)
Peripherally Inserted Central Catheter/Midline Placement  The IV Nurse has discussed with the patient and/or persons authorized to consent for the patient, the purpose of this procedure and the potential benefits and risks involved with this procedure.  The benefits include less needle sticks, lab draws from the catheter, and the patient may be discharged home with the catheter. Risks include, but not limited to, infection, bleeding, blood clot (thrombus formation), and puncture of an artery; nerve damage and irregular heartbeat and possibility to perform a PICC exchange if needed/ordered by physician.  Alternatives to this procedure were also discussed.  Bard Power PICC patient education guide, fact sheet on infection prevention and patient information card has been provided to patient /or left at bedside.    PICC/Midline Placement Documentation  PICC Single Lumen 78/24/23 PICC Right Basilic 44 cm 0 cm (Active)  Indication for Insertion or Continuance of Line Home intravenous therapies (PICC only) 09/13/2018  3:15 PM  Exposed Catheter (cm) 0 cm 09/13/2018  3:15 PM  Site Assessment Clean;Dry;Intact 09/13/2018  3:15 PM  Line Status Flushed;Blood return noted 09/13/2018  3:15 PM  Dressing Type Transparent 09/13/2018  3:15 PM  Dressing Status Clean;Dry;Intact;Antimicrobial disc in place;Other (Comment) 09/13/2018  3:15 PM  Dressing Intervention New dressing 09/13/2018  3:15 PM  Dressing Change Due 09/20/18 09/13/2018  3:15 PM       Christella Noa Albarece 09/13/2018, 3:16 PM

## 2018-09-13 NOTE — Progress Notes (Signed)
PHARMACY CONSULT NOTE FOR:  OUTPATIENT  PARENTERAL ANTIBIOTIC THERAPY (OPAT)  Indication: MSSA left knee Prosthetic Joint infection- s/p removal of hardware on 09/11/18 Regimen: nafcillin 12 g IV over 24 hours as a continuous infusion End date: 10/23/18  IV antibiotic discharge orders are pended. To discharging provider:  please sign these orders via discharge navigator,  Select New Orders & click on the button choice - Manage This Unsigned Work.     Thank you for allowing pharmacy to be a part of this patient's care.  Tawnya Crook, PharmD Pharmacy Resident  09/13/2018 3:08 PM

## 2018-09-13 NOTE — Discharge Instructions (Signed)
° °  TOTAL KNEE HARDWARE REMOVAL POSTOPERATIVE DIRECTIONS    HOME CARE INSTRUCTIONS  Please stay nonweightbearing on left lower extremity.  You may work on gentle knee range of motion.    Remove Praveena wound VAC on 09/21/2018 and apply honeycomb dressing  Take pain medication as prescribed.  Please take stool softeners as needed for constipation.  He will be on IV antibiotics for 6 weeks.  Please follow-up with Dr. Steva Ready in 6 weeks.  Follow-up with [facility] orthopedics in 2 weeks for staple removal and Steri-Strip application.  Wear compression stockings on bilateral lower extremities for 6 weeks.  He may remove at nighttime.  Call [facility] clinic orthopedics for any concerns, questions, worsening symptoms or urgent changes in her health.  DIET You may resume your previous home diet once your are discharged from the hospital.  DRESSING / WOUND CARE / SHOWERING You may start showering once staples have been removed. Change the surgical dressing as needed.    POSTOPERATIVE CONSTIPATION PROTOCOL Constipation - defined medically as fewer than three stools per week and severe constipation as less than one stool per week.  One of the most common issues patients have following surgery is constipation.  Even if you have a regular bowel pattern at home, your normal regimen is likely to be disrupted due to multiple reasons following surgery.  Combination of anesthesia, postoperative narcotics, change in appetite and fluid intake all can affect your bowels.  In order to avoid complications following surgery, here are some recommendations in order to help you during your recovery period.  Colace (docusate) - Pick up an over-the-counter form of Colace or another stool softener and take twice a day as long as you are requiring postoperative pain medications.  Take with a full glass of water daily.  If you experience loose stools or diarrhea, hold the colace until you stool forms back up.  If your  symptoms do not get better within 1 week or if they get worse, check with your doctor.  Dulcolax (bisacodyl) - Pick up over-the-counter and take as directed by the product packaging as needed to assist with the movement of your bowels.  Take with a full glass of water.  Use this product as needed if not relieved by Colace only.   MiraLax (polyethylene glycol) - Pick up over-the-counter to have on hand.  MiraLax is a solution that will increase the amount of water in your bowels to assist with bowel movements.  Take as directed and can mix with a glass of water, juice, soda, coffee, or tea.  Take if you go more than two days without a movement. Do not use MiraLax more than once per day. Call your doctor if you are still constipated or irregular after using this medication for 7 days in a row.  If you continue to have problems with postoperative constipation, please contact the office for further assistance and recommendations.  If you experience "the worst abdominal pain ever" or develop nausea or vomiting, please contact the office immediatly for further recommendations for treatment.   PRECAUTIONS If you experience chest pain or shortness of breath - call 911 immediately for transfer to the hospital emergency department.

## 2018-09-13 NOTE — Progress Notes (Addendum)
   Subjective: 2 Days Post-Op Procedure(s) (LRB): TOTAL KNEE ARTHROPLASTY - LEFT - HARDWARE REMOVAL WITH INSERTION OF ANTIBIOTIC SPACER (Left) Patient reports pain as moderate.   Patient is well, and has had no acute complaints or problems Denies any CP, SOB, ABD pain. We will continue therapy today.   Objective: Vital signs in last 24 hours: Temp:  [97.5 F (36.4 C)-98.7 F (37.1 C)] 97.5 F (36.4 C) (05/14 0443) Pulse Rate:  [66-84] 74 (05/14 0443) Resp:  [16-18] 18 (05/14 0443) BP: (112-135)/(57-82) 121/58 (05/14 0443) SpO2:  [94 %-100 %] 96 % (05/14 0443)  Intake/Output from previous day: 05/13 0701 - 05/14 0700 In: 1962.2 [P.O.:840; I.V.:722.2; IV Piggyback:400] Out: 1275 [Urine:800; Drains:475] Intake/Output this shift: Total I/O In: -  Out: 450 [Urine:450]  Recent Labs    09/11/18 1243 09/12/18 0622 09/13/18 0424  HGB 12.3* 10.8* 11.0*   Recent Labs    09/12/18 0622 09/13/18 0424  WBC 10.6* 10.9*  RBC 3.65* 3.74*  HCT 33.1* 33.7*  PLT 353 330   Recent Labs    09/11/18 1243 09/13/18 0424  NA  --  137  K  --  3.1*  CL  --  100  CO2  --  29  BUN  --  11  CREATININE 0.80 0.89  GLUCOSE  --  143*  CALCIUM  --  8.5*   No results for input(s): LABPT, INR in the last 72 hours.  EXAM General - Patient is Alert, Appropriate and Oriented Extremity - Neurovascular intact Sensation intact distally Intact pulses distally Dorsiflexion/Plantar flexion intact No cellulitis present Compartment soft Dressing - dressing C/D/I and prevena intact with 50 cc drainage  Motor Function - intact, moving foot and toes well on exam.   Past Medical History:  Diagnosis Date  . Anxiety   . Arthritis    knees, hands  . Bipolar disorder ([place])   . Chronic kidney disease    arf 2014 due to dehydration  . Complication of anesthesia    woke up during last knee surgery feb 2020  . GERD (gastroesophageal reflux disease)    rare no meds  . Headache   . Hemorrhoids    . Hyperlipidemia   . Hypertension   . LFT elevation   . NAFLD (nonalcoholic fatty liver disease)     Assessment/Plan:   2 Days Post-Op Procedure(s) (LRB): TOTAL KNEE ARTHROPLASTY - LEFT - HARDWARE REMOVAL WITH INSERTION OF ANTIBIOTIC SPACER (Left) Active Problems:   Infection of total left knee replacement (HCC)   Hypokalemia  Estimated body mass index is 39.49 kg/m as calculated from the following:   Height as of this encounter: 6\' 3"  (1.905 m).   Weight as of this encounter: 143.3 kg. Advance diet Up with therapy, NWB left lower extremity Continue with IV abx. ID recommends 6 weeks of Nafcillin Intraoperative cultures showing staph aureus. Sensitivities pending Recheck labs in the am Hypokalemia - oral K given today. Recheck labs in the  Am Order placed for PICC line Care manager to assist with discharge to home   DVT Prophylaxis - Lovenox and TED hose    T. Rachelle Hora, PA-C [place] 09/13/2018, 8:16 AM

## 2018-09-13 NOTE — Plan of Care (Signed)

## 2018-09-13 NOTE — Progress Notes (Signed)
Pt refused bed alarm 

## 2018-09-13 NOTE — Discharge Summary (Addendum)
Physician Discharge Summary  Patient ID: HOUA Nathan Chambers MRN: [id number] DOB/AGE: Apr 18, 1966 53 y.o.  Admit date: 09/11/2018 Discharge date: 09/14/2018 Admission Diagnoses:  ACUTE PAIN OF LEFT KNEE   Discharge Diagnoses: Patient Active Problem List   Diagnosis Date Noted  . Chronic radicular lumbar pain 08/28/2018  . History of bilateral knee replacement 08/28/2018  . Lumbar radicular pain 08/28/2018  . Lumbar facet arthropathy 08/28/2018  . Spinal stenosis of lumbar region without neurogenic claudication 08/28/2018  . Lumbar spondylosis 08/28/2018  . Neuroforaminal stenosis of lumbar spine 08/28/2018  . Sensory polyneuropathy 08/28/2018  . Infection of total left knee replacement ([place]) 06/10/2018  . Arthritis, septic, knee ([place]) 12/08/2017  . Hyperglycemia, unspecified 02/16/2016  . Anxiety 01/26/2015  . Hyperlipidemia 01/26/2015  . Hypertension 01/26/2015    Past Medical History:  Diagnosis Date  . Anxiety   . Arthritis    knees, hands  . Bipolar disorder ([place])   . Chronic kidney disease    arf 2014 due to dehydration  . Complication of anesthesia    woke up during last knee surgery feb 2020  . GERD (gastroesophageal reflux disease)    rare no meds  . Headache   . Hemorrhoids   . Hyperlipidemia   . Hypertension   . LFT elevation   . NAFLD (nonalcoholic fatty liver disease)      Transfusion: none   Consultants (if any):   Discharged Condition: Improved  Hospital Course: TRUNG WENZL is an 53 y.o. male who was admitted 09/11/2018 with a diagnosis of infected left total knee and went to the operating room on 09/11/2018 and underwent the above named procedures.    Surgeries: Procedure(s): TOTAL KNEE ARTHROPLASTY - LEFT - HARDWARE REMOVAL WITH INSERTION OF ANTIBIOTIC SPACER on 09/11/2018 Patient tolerated the surgery well. Taken to PACU where she was stabilized and then transferred to the orthopedic floor.  Started on Lovenox 40 mg q 24 hrs. Foot pumps applied  bilaterally at 80 mm. Heels elevated on bed with rolled towels. No evidence of DVT. Negative [place]. Physical therapy started on day #1 for gait training and transfer. OT started day #1 for ADL and assisted devices.  Patient's foley was d/c on day #1.  On postop day 1 patient made slow progress of physical therapy.  Intraoperative cultures show Staphylococcus aureus.  Infectious disease consulted and patient started on nafcillin.  It was determined that patient would need IV antibiotics for 6 weeks prior to implantation of total knee hardware.  On postop day 2 patient made improved progress of physical therapy.  Vital signs stable.  Patient with slight hypokalemia potassium 3.1.  Patient given oral potassium replacement. On post op day #3 patient was stable and ready for discharge to home with home health PT.  Implants: LEFT - HARDWARE REMOVAL WITH INSERTION OF ANTIBIOTIC SPACER (Left) root removal total knee with antibiotic cement spacer  He was given perioperative antibiotics:  Anti-infectives (From admission, onward)   Start     Dose/Rate Route Frequency Ordered Stop   09/14/18 0000  nafcillin IVPB     12 g Intravenous Continuous 09/13/18 1513 10/23/18 2359   09/13/18 0000  nafcillin 2 g in sodium chloride 0.9 % 100 mL  Status:  Discontinued     2 g 200 mL/hr over 30 Minutes Intravenous Every 4 hours 09/13/18 1215 09/13/18    09/11/18 2000  nafcillin 2 g in sodium chloride 0.9 % 100 mL IVPB     2 g 200 mL/hr over 30  Minutes Intravenous Every 4 hours 09/11/18 1532     09/11/18 1400  ceFAZolin (ANCEF) IVPB 2g/100 mL premix  Status:  Discontinued     2 g 200 mL/hr over 30 Minutes Intravenous Every 6 hours 09/11/18 1225 09/11/18 1532   09/11/18 0918  vancomycin (VANCOCIN) powder  Status:  Discontinued       As needed 09/11/18 0918 09/11/18 1039   09/10/18 2200  ceFAZolin (ANCEF) 3 g in dextrose 5 % 50 mL IVPB     3 g 100 mL/hr over 30 Minutes Intravenous  Once 09/10/18 2151 09/11/18 2203     .  He was given sequential compression devices, early ambulation, and Lovenox, teds for DVT prophylaxis.  He benefited maximally from the hospital stay and there were no complications.    Recent vital signs:  Vitals:   09/13/18 2033 09/14/18 0318  BP: 117/70 115/72  Pulse: 93 80  Resp: 19 19  Temp:  (!) 97.5 F (36.4 C)  SpO2: 96% 99%    Recent laboratory studies:  Lab Results  Component Value Date   HGB 10.4 (L) 09/14/2018   HGB 11.0 (L) 09/13/2018   HGB 10.8 (L) 09/12/2018   Lab Results  Component Value Date   WBC 7.9 09/14/2018   PLT 295 09/14/2018   Lab Results  Component Value Date   INR 1.1 09/06/2018   Lab Results  Component Value Date   NA 138 09/14/2018   K 3.6 09/14/2018   CL 99 09/14/2018   CO2 30 09/14/2018   BUN 9 09/14/2018   CREATININE 0.78 09/14/2018   GLUCOSE 123 (H) 09/14/2018    Discharge Medications:   Allergies as of 09/14/2018      Reactions   Etodolac Itching      Medication List    STOP taking these medications   HYDROcodone-acetaminophen 10-325 MG tablet Commonly known as:  NORCO   meloxicam 15 MG tablet Commonly known as:  MOBIC   oxyCODONE 5 MG immediate release tablet Commonly known as:  Oxy IR/ROXICODONE Replaced by:  oxyCODONE 20 mg 12 hr tablet   rifampin 300 MG capsule Commonly known as:  Rifadin   sulfamethoxazole-trimethoprim 800-160 MG tablet Commonly known as:  BACTRIM DS     TAKE these medications   acetaminophen 325 MG tablet Commonly known as:  TYLENOL Take 1-2 tablets (325-650 mg total) by mouth every 6 (six) hours as needed for mild pain (pain score 1-3 or temp > 100.5).   Alpha-Lipoic Acid 600 MG Tabs Take 600 mg by mouth daily.   ALPRAZolam 0.25 MG tablet Commonly known as:  XANAX Take 0.5-1 tablets by mouth daily before lunch. And prn   amitriptyline 50 MG tablet Commonly known as:  ELAVIL Take 50 mg by mouth at bedtime.   amLODipine-benazepril 10-40 MG capsule Commonly known as:   LOTREL Take 1 capsule by mouth every morning.   aspirin EC 81 MG tablet Take 81 mg by mouth daily.   busPIRone 5 MG tablet Commonly known as:  BUSPAR Take 5-10 mg by mouth 2 (two) times daily.   chlorthalidone 25 MG tablet Commonly known as:  HYGROTON Take 12.5 mg by mouth daily.   docusate sodium 100 MG capsule Commonly known as:  COLACE Take 1 capsule (100 mg total) by mouth 2 (two) times daily.   DULoxetine 60 MG capsule Commonly known as:  CYMBALTA Take 60 mg by mouth every morning.   enoxaparin 40 MG/0.4ML injection Commonly known as:  LOVENOX Inject 0.4 mLs (  40 mg total) into the skin daily for 14 days.   folic acid 1 MG tablet Commonly known as:  FOLVITE Take 1 mg by mouth daily.   gemfibrozil 600 MG tablet Commonly known as:  LOPID Take 600 mg by mouth 2 (two) times daily before a meal.   methocarbamol 750 MG tablet Commonly known as:  ROBAXIN Take 750 mg by mouth 2 (two) times daily as needed for muscle spasms.   multivitamin-lutein Caps capsule Take 1 capsule by mouth daily.   multivitamin with minerals tablet Take 1 tablet by mouth daily.   nafcillin  IVPB Inject 12 g into the vein continuous. Indication:  MSSA left knee Prosthetic Joint infection- s/p removal of hardware on 09/11/18 Last Day of Therapy:  10/23/18 Labs weekly on Monday while on IV antibiotics: _X_ CBC with differential _X_ CMP Labs once every 2 weeks on a Monday while on antibioitc _X_ CRP _X_ ESR   naloxone 0.4 MG/ML injection Commonly known as:  NARCAN Inject 1 mL (0.4 mg total) into the vein as needed (respiratory rate < 10).   oxyCODONE 20 mg 12 hr tablet Commonly known as:  OXYCONTIN Take 1 tablet (20 mg total) by mouth every 12 (twelve) hours. Replaces:  oxyCODONE 5 MG immediate release tablet   Oxycodone HCl 10 MG Tabs Take 1 tablet (10 mg total) by mouth every 4 (four) hours as needed for severe pain (pain score 7-10).   pravastatin 20 MG tablet Commonly known as:   PRAVACHOL Take 20 mg by mouth every evening.   rOPINIRole 0.5 MG tablet Commonly known as:  REQUIP Take 0.5 mg by mouth at bedtime.   vitamin B-12 1000 MCG tablet Commonly known as:  CYANOCOBALAMIN Take 1,000 mcg by mouth daily.   vitamin C 500 MG tablet Commonly known as:  ASCORBIC ACID Take 500 mg by mouth daily.            Home Infusion Instuctions  (From admission, onward)         Start     Ordered   09/13/18 0000  Home infusion instructions [facility] May follow [place] Dosing Protocol; May administer Cathflo as needed to maintain patency of vascular access device.; Flushing of vascular access device: per [facility] Protocol: 0.9% NaCl pre/post medica...    Question Answer Comment  Instructions May follow [place] Dosing Protocol   Instructions May administer Cathflo as needed to maintain patency of vascular access device.   Instructions Flushing of vascular access device: per [facility] Protocol: 0.9% NaCl pre/post medication administration and prn patency; Heparin 100 u/ml, 79m for implanted ports and Heparin 10u/ml, 529mfor all other central venous catheters.   Instructions May follow [facility] Anaphylaxis Protocol for First Dose Administration in the home: 0.9% NaCl at 25-50 ml/hr to maintain IV access for protocol meds. Epinephrine 0.3 ml IV/IM PRN and Benadryl 25-50 IV/IM PRN s/s of anaphylaxis.   Instructions [facility] Infusion Coordinator (RN) to assist per patient IV care needs in the home PRN.      09/13/18 1513           Durable Medical Equipment  (From admission, onward)         Start     Ordered   09/13/18 1605  For home use only DME standard manual wheelchair with seat cushion  Once    Comments:  Patient suffers from infected total knee which impairs their ability to perform daily activities like feeding and toileting in the home.  A walker will not resolve  issue with performing activities of daily living. A wheelchair will allow patient to  safely perform daily activities. Patient can safely propel the wheelchair in the home or has a caregiver who can provide assistance.  Accessories: elevating leg rests (ELRs), wheel locks, extensions and anti-tippers.   09/13/18 1605          Diagnostic Studies: Dg Knee 1-2 Views Left  Result Date: 09/11/2018 CLINICAL DATA:  Hardware removal with insertion of antibiotic spacer. EXAM: LEFT KNEE - 1-2 VIEW COMPARISON:  06/10/2018 FINDINGS: Previously seen total knee arthroplasty components have been completely removed. Antibiotic spacer devices and antibiotic beads are in place. Fluid and air present in the joint as expected post procedure. IMPRESSION: Hardware removal. Antibiotic spacer placement. Intra-articular antibiotic bead placement. Electronically Signed   By: Nelson Chimes M.D.   On: 09/11/2018 11:17   [place] Ekg Site Rite  Result Date: 09/13/2018 If Site Rite image not attached, placement could not be confirmed due to current cardiac rhythm.   Disposition: Discharge disposition: 01-Home or Self Care       Discharge Instructions    Home infusion instructions [place] May follow [place] Dosing Protocol; May administer Cathflo as needed to maintain patency of vascular access device.; Flushing of vascular access device: per [facility] Protocol: 0.9% NaCl pre/post medica...   Complete by:  As directed    Instructions:  May follow [place] Dosing Protocol   Instructions:  May administer Cathflo as needed to maintain patency of vascular access device.   Instructions:  Flushing of vascular access device: per [facility] Protocol: 0.9% NaCl pre/post medication administration and prn patency; Heparin 100 u/ml, 36m for implanted ports and Heparin 10u/ml, 529mfor all other central venous catheters.   Instructions:  May follow [facility] Anaphylaxis Protocol for First Dose Administration in the home: 0.9% NaCl at 25-50 ml/hr to maintain IV access for protocol meds. Epinephrine 0.3 ml IV/IM PRN and  Benadryl 25-50 IV/IM PRN s/s of anaphylaxis.   Instructions:  [facility] Coord[facility] (RN) to assist per patient IV care needs in the home PRN.      Follow-up Information    GaDuanne GuessPA-C Follow up in 2 week(s).   Specialties:  Orthopedic Surgery, Emergency Medicine Contact information: [address] [phone]          Signed: GAFeliberto Gottron/15/2020, 8:28 AM

## 2018-09-13 NOTE — TOC Progression Note (Signed)
Transition of Care [facility]) - Progression Note    Patient Details  Name: DERYL GIROUX MRN: [id number] Date of Birth: 1965/08/13  Transition of [facility]) CM/SW [place], RN Phone Number: 09/13/2018, 9:59 AM  Clinical Narrative:     Patient worked with PT this am with walking, he is unable to put weight on the operable leg.  I encouraged him to accept [facility] PT as well as nursing, he strongly declines.  He stated that he has done this before and he has no need for anything but [facility] nursing once weekly to change PICC line dressing and draw blood.  He will not agree with anything else.  He stated that he has a wheel chair at home and will go home via EMS due to not being able to get into his home with the stairs without assistance.  Will continue to Monitor for needs   Expected Discharge Plan: [place] Barriers to Discharge: Continued Medical Work up  Expected Discharge Plan and Services Expected Discharge Plan: [place]   Discharge Planning Services: CM Consult Post Acute Care Choice: [place] arrangements for the past 2 months: [place]: RN, IV Antibiotics HH Agency: [place] ([place]) Date [place]: 09/12/18 Time [place]: 0902 Representative spoke with at [place]: Corene Cornea and [place] ([place]) Interventions    Readmission Risk Interventions No flowsheet data found.

## 2018-09-14 DIAGNOSIS — Z95828 Presence of other vascular implants and grafts: Secondary | ICD-10-CM

## 2018-09-14 LAB — CBC
HCT: 31.4 % — ABNORMAL LOW (ref 39.0–52.0)
Hemoglobin: 10.4 g/dL — ABNORMAL LOW (ref 13.0–17.0)
MCH: 29.8 pg (ref 26.0–34.0)
MCHC: 33.1 g/dL (ref 30.0–36.0)
MCV: 90 fL (ref 80.0–100.0)
Platelets: 295 10*3/uL (ref 150–400)
RBC: 3.49 MIL/uL — ABNORMAL LOW (ref 4.22–5.81)
RDW: 12.5 % (ref 11.5–15.5)
WBC: 7.9 10*3/uL (ref 4.0–10.5)
nRBC: 0 % (ref 0.0–0.2)

## 2018-09-14 LAB — BASIC METABOLIC PANEL
Anion gap: 9 (ref 5–15)
BUN: 9 mg/dL (ref 6–20)
CO2: 30 mmol/L (ref 22–32)
Calcium: 8.8 mg/dL — ABNORMAL LOW (ref 8.9–10.3)
Chloride: 99 mmol/L (ref 98–111)
Creatinine, Ser: 0.78 mg/dL (ref 0.61–1.24)
GFR calc Af Amer: >60 mL/min (ref >60)
GFR calc non Af Amer: >60 mL/min (ref >60)
Glucose, Bld: 123 mg/dL — ABNORMAL HIGH (ref 70–99)
Potassium: 3.6 mmol/L (ref 3.5–5.1)
Sodium: 138 mmol/L (ref 135–145)

## 2018-09-14 NOTE — Anesthesia Postprocedure Evaluation (Signed)
Anesthesia Post Note  Patient: Nathan Chambers  Procedure(s) Performed: TOTAL KNEE ARTHROPLASTY - LEFT - HARDWARE REMOVAL WITH INSERTION OF ANTIBIOTIC SPACER (Left )  Patient location during evaluation: PACU Anesthesia Type: General Level of consciousness: awake and alert Pain management: pain level controlled Vital Signs Assessment: post-procedure vital signs reviewed and stable Respiratory status: spontaneous breathing, nonlabored ventilation, respiratory function stable and patient connected to nasal cannula oxygen Cardiovascular status: blood pressure returned to baseline and stable Postop Assessment: no apparent nausea or vomiting Anesthetic complications: no     Last Vitals:  Vitals:   09/13/18 2033 09/14/18 0318  BP: 117/70 115/72  Pulse: 93 80  Resp: 19 19  Temp:  (!) 36.4 C  SpO2: 96% 99%    Last Pain:  Vitals:   09/14/18 0855  TempSrc:   PainSc: 8                  Molli Barrows

## 2018-09-14 NOTE — Progress Notes (Signed)
   Date of Admission:  09/11/2018    MSSA left Knee prosthetic joint infection S/p explantation of the hardware on 09/11/18  Has spacer    Subjective: Frustrated generally. Feels that he is not getting wound vac containers to go home  Medications:  . amitriptyline  50 mg Oral QHS  . amLODipine  10 mg Oral Daily   And  . benazepril  40 mg Oral Daily  . aspirin EC  81 mg Oral Daily  . busPIRone  5-10 mg Oral BID  . chlorthalidone  12.5 mg Oral Daily  . docusate sodium  100 mg Oral BID  . DULoxetine  60 mg Oral q morning - 10a  . enoxaparin (LOVENOX) injection  40 mg Subcutaneous Q24H  . folic acid  1 mg Oral Daily  . gemfibrozil  600 mg Oral BID AC  . multivitamin with minerals  1 tablet Oral Daily  . multivitamin-lutein  1 capsule Oral Daily  . oxyCODONE  20 mg Oral Q12H  . potassium chloride  20 mEq Oral TID  . pravastatin  20 mg Oral QPM  . rOPINIRole  0.5 mg Oral QHS  . sodium chloride flush  10-40 mL Intracatheter Q12H  . vitamin B-12  1,000 mcg Oral Daily  . vitamin C  500 mg Oral Daily    Objective: Vital signs in last 24 hours: Temp:  [97.5 F (36.4 C)-98.1 F (36.7 C)] 97.5 F (36.4 C) (05/15 0318) Pulse Rate:  [80-93] 80 (05/15 0318) Resp:  [19-20] 19 (05/15 0318) BP: (115-117)/(61-72) 115/72 (05/15 0318) SpO2:  [96 %-99 %] 99 % (05/15 0318)  PHYSICAL EXAM:  Left knee swollen- wound vac present- container has bloodyfluid Chest CTA HS s1s2 RT PICC  Lab Results Recent Labs    09/13/18 0424 09/14/18 0516  WBC 10.9* 7.9  HGB 11.0* 10.4*  HCT 33.7* 31.4*  NA 137 138  K 3.1* 3.6  CL 100 99  CO2 29 30  BUN 11 9  CREATININE 0.89 0.78   Liver Panel No results for input(s): PROT, ALBUMIN, AST, ALT, ALKPHOS, BILITOT, BILIDIR, IBILI in the last 72 hours. Sedimentation Rate No results for input(s): ESRSEDRATE in the last 72 hours. C-Reactive Protein No results for input(s): CRP in the last 72 hours.  Microbiology:  Studies/Results: [place] Ekg Site  Rite  Result Date: 09/13/2018 If Site Rite image not attached, placement could not be confirmed due to current cardiac rhythm.    Assessment/Plan: Persistent staph aureus infection of prosthetic left knee joint. S/p explantation of hardware  with insertion of antibiotic spacer  09/11/18   ON IV nafcillin Will need 6 weeks of IV?and then will follow with PO    HTN on amlodipine,benazepril Hyperlipidemia- on gemfibrozil, pravastatin  Bipolar disorder on  Xanax, amytriptyline, buspirone, duloxitene and zolpidem  Will follow him as OP Discussed the management with patient and his nurse and the primary team

## 2018-09-14 NOTE — Progress Notes (Signed)
RN educated pt on discharge instructions and pt verbalized understanding. RN removed IV and flushed pts PICC line prior to discharge. Pt received 3 honeycomb dressings and 3 Prevena wound vac cartridges. Prevena intact. VSS. Wheeled to medical mall and assisted into family vehicle.

## 2018-09-14 NOTE — TOC Transition Note (Signed)
Transition of Care [facility]) - CM/SW Discharge Note   Patient Details  Name: Nathan Chambers MRN: [id number] Date of Birth: 07/08/1965  Transition of [facility]) CM/SW Contact:  Su Hilt, RN Phone Number: 09/14/2018, 9:36 AM   Clinical Narrative:    Patient to discharge home with Home health nurse to care for the PICC line in place, he will have IV antibiotics infused that was [facility] with [facility],  The patient will be transported via EMS due to the inability to walk and get into the home, He has a wheelchair at home.    Final next level of care: [place] Barriers to Discharge: Barriers Resolved   Patient Goals and CMS Choice     Choice offered to / list presented to : Patient  Discharge Placement                       Discharge Plan and Services   Discharge Planning Services: CM Consult Post Acute Care Choice: Home Health                    HH Arranged: RN [place]: [place] ([place]) Date [facility] Agency Contacted: 09/12/18 Time [place]: 0902 Representative spoke with at [place]: Corene Cornea and Olin Hauser  Social Determinants of Health ([place]) Interventions     Readmission Risk Interventions No flowsheet data found.

## 2018-09-14 NOTE — Progress Notes (Signed)
   Subjective: 3 Days Post-Op Procedure(s) (LRB): TOTAL KNEE ARTHROPLASTY - LEFT - HARDWARE REMOVAL WITH INSERTION OF ANTIBIOTIC SPACER (Left) Patient reports pain as moderate.   Patient is well, and has had no acute complaints or problems Denies any CP, SOB, ABD pain. We will continue therapy today.   Objective: Vital signs in last 24 hours: Temp:  [97.5 F (36.4 C)-98.1 F (36.7 C)] 97.5 F (36.4 C) (05/15 0318) Pulse Rate:  [80-93] 80 (05/15 0318) Resp:  [19-20] 19 (05/15 0318) BP: (115-117)/(61-72) 115/72 (05/15 0318) SpO2:  [96 %-99 %] 99 % (05/15 0318)  Intake/Output from previous day: 05/14 0701 - 05/15 0700 In: 360 [P.O.:360] Out: 2600 [Urine:2550; Drains:50] Intake/Output this shift: No intake/output data recorded.  Recent Labs    09/11/18 1243 09/12/18 0622 09/13/18 0424 09/14/18 0516  HGB 12.3* 10.8* 11.0* 10.4*   Recent Labs    09/13/18 0424 09/14/18 0516  WBC 10.9* 7.9  RBC 3.74* 3.49*  HCT 33.7* 31.4*  PLT 330 295   Recent Labs    09/13/18 0424 09/14/18 0516  NA 137 138  K 3.1* 3.6  CL 100 99  CO2 29 30  BUN 11 9  CREATININE 0.89 0.78  GLUCOSE 143* 123*  CALCIUM 8.5* 8.8*   No results for input(s): LABPT, INR in the last 72 hours.  EXAM General - Patient is Alert, Appropriate and Oriented Extremity - Neurovascular intact Sensation intact distally Intact pulses distally Dorsiflexion/Plantar flexion intact No cellulitis present Compartment soft Dressing - dressing C/D/I and prevena intact with 100 cc drainage  Motor Function - intact, moving foot and toes well on exam.   Past Medical History:  Diagnosis Date  . Anxiety   . Arthritis    knees, hands  . Bipolar disorder ([place])   . Chronic kidney disease    arf 2014 due to dehydration  . Complication of anesthesia    woke up during last knee surgery feb 2020  . GERD (gastroesophageal reflux disease)    rare no meds  . Headache   . Hemorrhoids   . Hyperlipidemia   .  Hypertension   . LFT elevation   . NAFLD (nonalcoholic fatty liver disease)     Assessment/Plan:   3 Days Post-Op Procedure(s) (LRB): TOTAL KNEE ARTHROPLASTY - LEFT - HARDWARE REMOVAL WITH INSERTION OF ANTIBIOTIC SPACER (Left) Active Problems:   Infection of total left knee replacement (HCC)   Hypokalemia  Estimated body mass index is 39.49 kg/m as calculated from the following:   Height as of this encounter: 6\' 3"  (1.905 m).   Weight as of this encounter: 143.3 kg. Advance diet Up with therapy, NWB left lower extremity Continue with IV abx. ID recommends 6 weeks of Nafcillin Intraoperative cultures showing staph aureus. Sensitivities pending Recheck labs in the am Hypokalemia -resolved.  Potassium 3.6. Order placed for PICC line Care manager to assist with discharge to home today    DVT Prophylaxis - Lovenox and TED hose    T. Rachelle Hora, PA-C [place] 09/14/2018, 8:25 AM

## 2018-09-14 NOTE — Progress Notes (Signed)
Physical Therapy Treatment Patient Details Name: Nathan Chambers MRN: [id number] DOB: 1965/11/30 Today's Date: 09/14/2018    History of Present Illness 53 y.o. male s/p L TKA HWR with insertion of antibiotic spacer 09/11/18.  PMH includes sleep apnea, htn, HA's, anxiety bipolar disorder, (+) HIV, B TKR 04/09/2016, L total knee revision (I&D with polyexchange) 06/11/18, and h/o multiple L knee surgeries.    PT Comments    Pt continues to improve quality of steppage with increased efficiency using walker, no safety issues and less fatigue with prolonged walker this date (HR 120s with 60 ft, O2 high 90s).  Pt is somewhat anxious about returning home but knows that he will not be doing anything more strenuous than what he has done here.  Pt able to maintain TTWBing surprisingly well with mobility tasks, but is clearly frustrated that he is now able to use LE normally for a while and will need to come back for more sx and rehab after this episode has passed.  Pt safe to go home from PT stand-point, has w/c and ramp in place at home.    Follow Up Recommendations  Supervision/Assistance - 24 hour;Home health PT     Equipment Recommendations  (ramp built, has w/c now)    Recommendations for Other Services       Precautions / Restrictions Precautions Precautions: Fall;Knee Restrictions LLE Weight Bearing: Touchdown weight bearing    Mobility  Bed Mobility Overal bed mobility: Modified Independent Bed Mobility: Sit to Supine       Sit to supine: Min guard   General bed mobility comments: Pt was able to use R LE to assist L into bed this date, did not need PT to adjust LEs once in supine  Transfers Overall transfer level: Needs assistance Equipment used: Rolling walker (2 wheeled) Transfers: Sit to/from Stand Sit to Stand: Supervision Stand pivot transfers: Modified independent (Device/Increase time)       General transfer comment: Pt did not need cuing for R foot set up or hand  placement, able to rise w/o direct assist and maintained balance while pulling his gym shorts up  Ambulation/Gait Ambulation/Gait assistance: Min guard Gait Distance (Feet): 60 Feet Assistive device: Rolling walker (2 wheeled)       General Gait Details: Pt with improved quality/consistency of steppage this session with less fatigue.  Pt reports "Just like the other times it gets better every day."  Pt with good effort, ability to maintain TTWBing (walker adjusted slightly lower than normal with good results and less UE fatigue) and overall has consistently shown safety for in-home distances.    Stairs             Wheelchair Mobility    Modified Rankin (Stroke Patients Only)       Balance Overall balance assessment: Modified Independent   Sitting balance-Leahy Scale: Normal       Standing balance-Leahy Scale: Good Standing balance comment: Pt was able to maintain standing using walker, and able to maintain TTWBing with relative ease                            Cognition Arousal/Alertness: Awake/alert Behavior During Therapy: [facility] for tasks assessed/performed;Impulsive Overall Cognitive Status: Within Functional Limits for tasks assessed  Exercises General Exercises - Lower Extremity Ankle Circles/Pumps: AROM;10 reps Quad Sets: Strengthening;10 reps(minimal ROM lightly resisted at popliteal fossa mid joint ) Short Arc Quad: (unable to tolerate, showed effort but too much pain) Hip ABduction/ADduction: AAROM;Strengthening;10 reps(LE guidance with lightly resisted proximal pressure )    General Comments        Pertinent Vitals/Pain Pain Assessment: 0-10 Pain Score: 8  Pain Location: L knee    Home Living                      Prior Function            PT Goals (current goals can now be found in the care plan section) Progress towards PT goals: Progressing toward goals     Frequency    BID      PT Plan Current plan remains appropriate    Co-evaluation              AM-PAC PT "6 Clicks" Mobility   Outcome Measure  Help needed turning from your back to your side while in a flat bed without using bedrails?: None Help needed moving from lying on your back to sitting on the side of a flat bed without using bedrails?: None Help needed moving to and from a bed to a chair (including a wheelchair)?: A Little Help needed standing up from a chair using your arms (e.g., wheelchair or bedside chair)?: A Little Help needed to walk in hospital room?: A Little Help needed climbing 3-5 steps with a railing? : Total 6 Click Score: 18    End of Session Equipment Utilized During Treatment: Gait belt Activity Tolerance: Patient limited by pain;Patient limited by fatigue Patient left: with call bell/phone within reach;with bed alarm set Nurse Communication: Mobility status PT Visit Diagnosis: Other abnormalities of gait and mobility (R26.89);Muscle weakness (generalized) (M62.81);Difficulty in walking, not elsewhere classified (R26.2);Pain Pain - Right/Left: Left Pain - part of body: Knee     Time: 8101-7510 PT Time Calculation (min) (ACUTE ONLY): 32 min  Charges:  $Gait Training: 8-22 mins $Therapeutic Exercise: 8-22 mins                     Kreg Shropshire, DPT 09/14/2018, 10:12 AM

## 2018-09-16 LAB — AEROBIC/ANAEROBIC CULTURE W GRAM STAIN (SURGICAL/DEEP WOUND)

## 2018-09-17 LAB — CULTURE, BLOOD (ROUTINE X 2)
Culture: NO GROWTH
Culture: NO GROWTH
Special Requests: ADEQUATE
Special Requests: ADEQUATE

## 2018-10-04 ENCOUNTER — Other Ambulatory Visit: Payer: Self-pay

## 2018-10-04 ENCOUNTER — Ambulatory Visit: Payer: 59 | Attending: Infectious Diseases | Admitting: Infectious Diseases

## 2018-10-04 ENCOUNTER — Encounter: Payer: Self-pay | Admitting: Infectious Diseases

## 2018-10-04 DIAGNOSIS — K219 Gastro-esophageal reflux disease without esophagitis: Secondary | ICD-10-CM | POA: Insufficient documentation

## 2018-10-04 DIAGNOSIS — E785 Hyperlipidemia, unspecified: Secondary | ICD-10-CM | POA: Diagnosis not present

## 2018-10-04 DIAGNOSIS — Z888 Allergy status to other drugs, medicaments and biological substances status: Secondary | ICD-10-CM

## 2018-10-04 DIAGNOSIS — B9561 Methicillin susceptible Staphylococcus aureus infection as the cause of diseases classified elsewhere: Secondary | ICD-10-CM | POA: Diagnosis not present

## 2018-10-04 DIAGNOSIS — M25462 Effusion, left knee: Secondary | ICD-10-CM | POA: Insufficient documentation

## 2018-10-04 DIAGNOSIS — Z96653 Presence of artificial knee joint, bilateral: Secondary | ICD-10-CM | POA: Diagnosis not present

## 2018-10-04 DIAGNOSIS — M1990 Unspecified osteoarthritis, unspecified site: Secondary | ICD-10-CM | POA: Diagnosis not present

## 2018-10-04 DIAGNOSIS — N189 Chronic kidney disease, unspecified: Secondary | ICD-10-CM | POA: Diagnosis not present

## 2018-10-04 DIAGNOSIS — I129 Hypertensive chronic kidney disease with stage 1 through stage 4 chronic kidney disease, or unspecified chronic kidney disease: Secondary | ICD-10-CM | POA: Diagnosis not present

## 2018-10-04 DIAGNOSIS — Z7901 Long term (current) use of anticoagulants: Secondary | ICD-10-CM | POA: Diagnosis not present

## 2018-10-04 DIAGNOSIS — A4901 Methicillin susceptible Staphylococcus aureus infection, unspecified site: Secondary | ICD-10-CM

## 2018-10-04 DIAGNOSIS — T8454XD Infection and inflammatory reaction due to internal left knee prosthesis, subsequent encounter: Secondary | ICD-10-CM | POA: Diagnosis not present

## 2018-10-04 DIAGNOSIS — Z79899 Other long term (current) drug therapy: Secondary | ICD-10-CM | POA: Diagnosis not present

## 2018-10-04 DIAGNOSIS — Z87891 Personal history of nicotine dependence: Secondary | ICD-10-CM

## 2018-10-04 DIAGNOSIS — M00062 Staphylococcal arthritis, left knee: Secondary | ICD-10-CM | POA: Diagnosis not present

## 2018-10-04 DIAGNOSIS — Z7982 Long term (current) use of aspirin: Secondary | ICD-10-CM | POA: Diagnosis not present

## 2018-10-04 DIAGNOSIS — F419 Anxiety disorder, unspecified: Secondary | ICD-10-CM | POA: Insufficient documentation

## 2018-10-04 DIAGNOSIS — Z95828 Presence of other vascular implants and grafts: Secondary | ICD-10-CM

## 2018-10-04 DIAGNOSIS — F319 Bipolar disorder, unspecified: Secondary | ICD-10-CM | POA: Diagnosis not present

## 2018-10-04 DIAGNOSIS — Z792 Long term (current) use of antibiotics: Secondary | ICD-10-CM

## 2018-10-04 DIAGNOSIS — T8450XD Infection and inflammatory reaction due to unspecified internal joint prosthesis, subsequent encounter: Secondary | ICD-10-CM

## 2018-10-04 NOTE — Patient Instructions (Signed)
You are here to follow up on the left knee infection- you cyrrently are taking Nafcillin IV infusion and the end date is 10/23/18 when you will finish 6 weeks- Will discuss with Dr.Menz to time aspiration of the knee for checking the bacteria

## 2018-10-04 NOTE — Progress Notes (Signed)
NAME: Nathan Chambers  DOB: Jun 01, 1965  MRN: [id number]  Date/Time: 10/04/2018 9:34 AM  Subjective:  Follow up visit after discharge from hospital  ? Nathan Chambers is a 53 y.o. male with a history of MSSA PJI of the left knee  Was recently in [facility] in May for recurrent infection of the left knee in-spite of oral antibiotic therapy underwent explantation of the hardware on 09/11/18 and was discharged home on 09/14/18 to complete 6 weeks of IV nafcillin 2 grams Q4 ( continuous infusion) until 10/23/18. He has a complicated history . He had B/l knee replacement on 04/19/2016 by Dr.Menz. was doing okay until Aug 2019. Pt was in [place] 12/08/17 and 12/12/17 for left knee pain and swelling following an injury he sustained by weed mover to the left calf. Patient had left knee arthrotomy with irrigation and debridement and polyethylene exchange and placement of antibiotic impregnated bio composite beedson 12/08/2017 by Dr. Marry Guan. . The synovial culture came back as staph aureusand he wastreated with 6 weeks ofIV cefazolin and PO rifampin. After the completion of IV he has been on Bactrim and rifampin  He was followed by me as Op and his last labs from 05/09/18 showed CRP of 1 and ESR of 8 .   on 06/08/18. he developed sudden onset left knee pain and could not walk after missing 2 days of antibiotics.He was admitted to the hospital on 06/10/18 WBC was 11, CRP 21.5 and ESR 39. He was seen by Dr.Menz and taken for I/D of left knee with polyexchange and tissue culture was sent.  He was sent home on Bactrim and rifampin on 06/15/18. Culture was negative.there was a question whether it was CPPD arthropathy. I saw him in my office on 08/09/18 and he was doing well with some pain and swelling left knee but no fever or chills. He was ambulating. He presented to ortho on 4/29  With sudden onset pain and swelling  Of 2 weeks duration.He was given a 10 day steroid taper and asked to ICE and elevate his knee.As his ESR was high  at 95, he returned to see ortho on 5/4 and aspiration of the left knee and the culture came back as MSSA. He was brought to the hospital and underwent removal of TKA 0n 09/11/18 and has been home on IV nafcillin..  100% adherent to IV antibiotic He has no fever or chills Left knee is swollen  Past Medical History:  Diagnosis Date  . Anxiety   . Arthritis    knees, hands  . Bipolar disorder ([place])   . Chronic kidney disease    arf 2014 due to dehydration  . Complication of anesthesia    woke up during last knee surgery feb 2020  . GERD (gastroesophageal reflux disease)    rare no meds  . Headache   . Hemorrhoids   . Hyperlipidemia   . Hypertension   . LFT elevation   . NAFLD (nonalcoholic fatty liver disease)     Past Surgical History:  Procedure Laterality Date  . ACHILLES TENDON SURGERY Right 10/30/2015   Procedure: ACHILLES TENDON REPAIR/ HEGLAND OSTECTOMY;  Surgeon: [name], DPM;  Location: [facility] ORS;  Service: Podiatry;  Laterality: Right;  . COLONOSCOPY    . COLONOSCOPY WITH PROPOFOL N/A 04/07/2017   Procedure: COLONOSCOPY WITH PROPOFOL;  Surgeon: Toledo, Benay Pike, MD;  Location: [facility] END[facility]COPY;  Service: Gastroenterology;  Laterality: N/A;  . FRACTURE SURGERY Left    MIDDLE FINGER  . HERNIA REPAIR    .  I&D KNEE WITH POLY EXCHANGE Left 12/08/2017   Procedure: IRRIGATION AND DEBRIDEMENT KNEE WITH POLY EXCHANGE;  Surgeon: Dereck Leep, MD;  Location: [facility] ORS;  Service: Orthopedics;  Laterality: Left;  . JOINT REPLACEMENT Bilateral 2017   bilateral knees  . KNEE ARTHROTOMY Left 12/08/2017   Procedure: KNEE ARTHROTOMY, I&D, [place] OUT, POLY EXCHANGE;  Surgeon: Dereck Leep, MD;  Location: [facility] ORS;  Service: Orthopedics;  Laterality: Left;  . LIPOMA EXCISION    . TOTAL KNEE ARTHROPLASTY Bilateral 04/19/2016   Procedure: TOTAL KNEE BILATERAL;  Surgeon: Hessie Knows, MD;  Location: [facility] ORS;  Service: Orthopedics;  Laterality: Bilateral;  . TOTAL KNEE ARTHROPLASTY Left  09/11/2018   Procedure: TOTAL KNEE ARTHROPLASTY - LEFT - HARDWARE REMOVAL WITH INSERTION OF ANTIBIOTIC SPACER;  Surgeon: Hessie Knows, MD;  Location: [facility] ORS;  Service: Orthopedics;  Laterality: Left;  . TOTAL KNEE REVISION Left 06/11/2018   Procedure: INCISION AND DRAINAGE OF LEFT KNEE, POLY EXCHANGE;  Surgeon: Hessie Knows, MD;  Location: [facility] ORS;  Service: Orthopedics;  Laterality: Left;  Marland Kitchen VASECTOMY      Social History   Socioeconomic History  . Marital status: Married    Spouse name: Not on file  . Number of children: Not on file  . Years of education: Not on file  . Highest education level: Not on file  Occupational History  . Not on file  Social Needs  . Financial resource strain: Somewhat hard  . Food insecurity:    Worry: Never true    Inability: Never true  . Transportation needs:    Medical: Patient refused    Non-medical: Patient refused  Tobacco Use  . Smoking status: Never Smoker  . Smokeless tobacco: Former Systems developer    Types: Chew  Substance and Sexual Activity  . Alcohol use: No  . Drug use: No  . Sexual activity: Yes  Lifestyle  . Physical activity:    Days per week: 1 day    Minutes per session: 30 min  . Stress: To some extent  Relationships  . Social connections:    Talks on phone: More than three times a week    Gets together: More than three times a week    Attends religious service: Never    Active member of club or organization: No    Attends meetings of clubs or organizations: Never    Relationship status: Married  . Intimate partner violence:    Fear of current or ex partner: Patient refused    Emotionally abused: Patient refused    Physically abused: Patient refused    Forced sexual activity: Patient refused  Other Topics Concern  . Not on file  Social History Narrative  . Not on file    No family history on file. Allergies  Allergen Reactions  . Etodolac Itching    ? Current Outpatient Medications  Medication Sig Dispense Refill   . acetaminophen (TYLENOL) 325 MG tablet Take 1-2 tablets (325-650 mg total) by mouth every 6 (six) hours as needed for mild pain (pain score 1-3 or temp > 100.5).    Marland Kitchen Alpha-Lipoic Acid 600 MG TABS Take 600 mg by mouth daily.    Marland Kitchen amitriptyline (ELAVIL) 50 MG tablet Take 50 mg by mouth at bedtime.    Marland Kitchen amLODipine-benazepril (LOTREL) 10-40 MG capsule Take 1 capsule by mouth every morning.     . busPIRone (BUSPAR) 5 MG tablet Take 5-10 mg by mouth 2 (two) times daily.     . chlorthalidone (  HYGROTON) 25 MG tablet Take 12.5 mg by mouth daily.    . DULoxetine (CYMBALTA) 60 MG capsule Take 60 mg by mouth every morning.     . folic acid (FOLVITE) 1 MG tablet Take 1 mg by mouth daily.     Marland Kitchen gemfibrozil (LOPID) 600 MG tablet Take 600 mg by mouth 2 (two) times daily before a meal.    . methocarbamol (ROBAXIN) 750 MG tablet Take 750 mg by mouth 2 (two) times daily as needed for muscle spasms.    . Multiple Vitamins-Minerals (MULTIVITAMIN WITH MINERALS) tablet Take 1 tablet by mouth daily.    . multivitamin-lutein (OCUVITE-LUTEIN) CAPS capsule Take 1 capsule by mouth daily.    . nafcillin IVPB Inject 12 g into the vein continuous. Indication:  MSSA left knee Prosthetic Joint infection- s/p removal of hardware on 09/11/18 Last Day of Therapy:  10/23/18 Labs weekly on Monday while on IV antibiotics: _X_ CBC with differential _X_ CMP Labs once every 2 weeks on a Monday while on antibioitc _X_ CRP _X_ ESR 39 Units 0  . oxyCODONE (OXYCONTIN) 20 mg 12 hr tablet Take 1 tablet (20 mg total) by mouth every 12 (twelve) hours. 14 tablet 0  . oxyCODONE 10 MG TABS Take 1 tablet (10 mg total) by mouth every 4 (four) hours as needed for severe pain (pain score 7-10). 40 tablet 0  . pravastatin (PRAVACHOL) 20 MG tablet Take 20 mg by mouth every evening.     . vitamin B-12 (CYANOCOBALAMIN) 1000 MCG tablet Take 1,000 mcg by mouth daily.     . vitamin C (ASCORBIC ACID) 500 MG tablet Take 500 mg by mouth daily.    Marland Kitchen  ALPRAZolam (XANAX) 0.25 MG tablet Take 0.5-1 tablets by mouth daily before lunch. And prn  0  . aspirin EC 81 MG tablet Take 81 mg by mouth daily.    Marland Kitchen docusate sodium (COLACE) 100 MG capsule Take 1 capsule (100 mg total) by mouth 2 (two) times daily. (Patient not taking: Reported on 10/04/2018) 10 capsule 0  . enoxaparin (LOVENOX) 40 MG/0.4ML injection Inject 0.4 mLs (40 mg total) into the skin daily for 14 days. 14 Syringe 0  . naloxone (NARCAN) 0.4 MG/ML injection Inject 1 mL (0.4 mg total) into the vein as needed (respiratory rate < 10). (Patient not taking: Reported on 09/06/2018) 1 mL 0  . rOPINIRole (REQUIP) 0.5 MG tablet Take 0.5 mg by mouth at bedtime.  0   No current facility-administered medications for this visit.       REVIEW OF SYSTEMS:  Const: negative fever, negative chills, negative weight loss Eyes: negative diplopia or visual changes, negative eye pain ENT: negative coryza, negative sore throat Resp: negative cough, hemoptysis, dyspnea Cards: negative for chest pain, palpitations, lower extremity edema GU: negative for frequency, dysuria and hematuria GI: Negative for abdominal pain, diarrhea, bleeding, constipation Skin: negative for rash and pruritus Heme: negative for easy bruising and gum/nose bleeding MS: left knee/leg restricted dmovt Neurolo:negative for headaches, dizziness, vertigo, memory problems  Psych: negative for feelings of anxiety, depression  Endocrine: no polyuria or polydipsia Allergy/Immunology- as above: Objective:  VITALS:  BP 137/88 (BP Location: Left Arm, Patient Position: Sitting, Cuff Size: Large)   Pulse 73   Temp 97.9 F (36.6 C) (Oral)   Ht '6\' 2"'$  (1.88 m)   Wt (!) 316 lb (143.3 kg)   BMI 40.57 kg/m  PHYSICAL EXAM:  General: Alert, cooperative, no distress, appears stated age.  Head: Normocephalic, without obvious  abnormality, atraumatic. Eyes: Conjunctivae clear, anicteric sclerae. Pupils are equal ENT Nares normal. No drainage or  sinus tenderness. Lips, mucosa, and tongue normal. No Thrush Neck: Supple, symmetrical, no adenopathy, thyroid: non tender no carotid bruit and no JVD. Back: No CVA tenderness. Lungs: Clear to auscultation bilaterally. No Wheezing or Rhonchi. No rales. Heart: Regular rate and rhythm, no murmur, rub or gallop. Abdomen: Soft, non-tender,not distended. Bowel sounds normal. No masses Extremities: left knee surgical sutures have been removed steristrips in place. Bony swelling of the knee Skin: No rashes or lesions. Or bruising Lymph: Cervical, supraclavicular normal. Neurologic: Grossly non-focal Rt arm PICC -site fine Pertinent Labs Lab Results  ESR 10/02/18 is 55 9 was 64 on 09/06/18) CRP pending  ? Impression/Recommendation ?MSSA left PJI knee- s/p explantation of hardware - has a spacer- continue Nafcillin until 10/23/18. He will be undergoing reimplantation after completion of antibiotic- recommend arthrocentesis and  culture 1-2 weeks after he completes  antibiotic and being off of any antibiotic ? ?Discussed the management with patient and his wife in great detail. Will discuss with Dr.Menz ___________________________________________________

## 2018-10-17 ENCOUNTER — Other Ambulatory Visit
Admission: RE | Admit: 2018-10-17 | Discharge: 2018-10-17 | Disposition: A | Discharge status: Home / Self Care | Payer: 59 | Source: Ambulatory Visit | Attending: Student in an Organized Health Care Education/Training Program | Admitting: Student in an Organized Health Care Education/Training Program

## 2018-10-17 ENCOUNTER — Other Ambulatory Visit: Payer: Self-pay

## 2018-10-17 DIAGNOSIS — Z1159 Encounter for screening for other viral diseases: Secondary | ICD-10-CM | POA: Diagnosis not present

## 2018-10-18 LAB — NOVEL CORONAVIRUS, NAA (HOSP ORDER, SEND-OUT TO REF LAB; TAT 18-24 HRS): SARS-CoV-2, NAA: NOT DETECTED

## 2018-10-22 ENCOUNTER — Encounter: Payer: Self-pay | Admitting: Student in an Organized Health Care Education/Training Program

## 2018-10-22 ENCOUNTER — Ambulatory Visit
Admission: RE | Admit: 2018-10-22 | Discharge: 2018-10-22 | Disposition: A | Discharge status: Home / Self Care | Payer: 59 | Source: Ambulatory Visit | Attending: Student in an Organized Health Care Education/Training Program | Admitting: Student in an Organized Health Care Education/Training Program

## 2018-10-22 ENCOUNTER — Other Ambulatory Visit: Payer: Self-pay

## 2018-10-22 ENCOUNTER — Ambulatory Visit (HOSPITAL_BASED_OUTPATIENT_CLINIC_OR_DEPARTMENT_OTHER): Payer: 59 | Admitting: Student in an Organized Health Care Education/Training Program

## 2018-10-22 VITALS — BP 164/114 | Temp 99.1°F | Resp 20 | Ht 75.0 in | Wt 325.0 lb

## 2018-10-22 DIAGNOSIS — M5416 Radiculopathy, lumbar region: Secondary | ICD-10-CM | POA: Diagnosis not present

## 2018-10-22 DIAGNOSIS — G8929 Other chronic pain: Secondary | ICD-10-CM | POA: Insufficient documentation

## 2018-10-22 MED ORDER — SODIUM CHLORIDE (PF) 0.9 % IJ SOLN
INTRAMUSCULAR | Status: AC
  Filled 2018-10-22: qty 10

## 2018-10-22 MED ORDER — SODIUM CHLORIDE 0.9% FLUSH
2.0000 mL | Freq: Once | INTRAVENOUS | Status: AC
  Administered 2018-10-22: 2 mL

## 2018-10-22 MED ORDER — IOHEXOL 180 MG/ML  SOLN
10.0000 mL | Freq: Once | INTRAMUSCULAR | Status: AC
  Administered 2018-10-22: 10 mL via EPIDURAL

## 2018-10-22 MED ORDER — DEXAMETHASONE SODIUM PHOSPHATE 10 MG/ML IJ SOLN
10.0000 mg | Freq: Once | INTRAMUSCULAR | Status: AC
  Administered 2018-10-22: 10 mg

## 2018-10-22 MED ORDER — DEXAMETHASONE SODIUM PHOSPHATE 10 MG/ML IJ SOLN
INTRAMUSCULAR | Status: AC
  Filled 2018-10-22: qty 1

## 2018-10-22 MED ORDER — LIDOCAINE HCL 2 % IJ SOLN
20.0000 mL | Freq: Once | INTRAMUSCULAR | Status: AC
  Administered 2018-10-22: 400 mg

## 2018-10-22 MED ORDER — ROPIVACAINE HCL 2 MG/ML IJ SOLN
2.0000 mL | Freq: Once | INTRAMUSCULAR | Status: AC
  Administered 2018-10-22: 2 mL via EPIDURAL

## 2018-10-22 MED ORDER — ROPIVACAINE HCL 2 MG/ML IJ SOLN
INTRAMUSCULAR | Status: AC
  Filled 2018-10-22: qty 10

## 2018-10-22 MED ORDER — LIDOCAINE HCL 2 % IJ SOLN
INTRAMUSCULAR | Status: AC
  Filled 2018-10-22: qty 20

## 2018-10-22 NOTE — Patient Instructions (Signed)

## 2018-10-22 NOTE — Progress Notes (Signed)
Safety precautions to be maintained throughout the outpatient stay will include: orient to surroundings, keep bed in low position, maintain call bell within reach at all times, provide assistance with transfer out of bed and ambulation.  

## 2018-10-22 NOTE — Progress Notes (Signed)
Patient's Name: Nathan Chambers  MRN: [id number]  Referring Provider: Juluis Pitch, MD  DOB: 12-24-1965  PCP: Juluis Pitch, MD  DOS: 10/22/2018  Note by: Gillis Santa, MD  Service setting: Ambulatory outpatient  Specialty: Interventional Pain Management  Patient type: Established  Location: [facility] (AMB) Pain Management Facility  Visit type: Interventional Procedure   Primary Reason for Visit: Interventional Pain Management Treatment. CC: Leg Pain (bilateral)  Procedure:          Anesthesia, Analgesia, Anxiolysis:  Type: Diagnostic Inter-Laminar Epidural Steroid Injection  #1  Region: Lumbar Level: L4-5 Level. Laterality: Left-Sided         Type: Local Anesthesia  Local Anesthetic: Lidocaine 1-2%  Position: Prone with head of the table was raised to facilitate breathing.   Indications: 1. Chronic radicular lumbar pain   2. Lumbar radicular pain    Pain Score: Pre-procedure: 10-Worst pain ever/10 Post-procedure: 9 /10  Pre-op Assessment:  Mr. Levene is a 53 y.o. (year old), male patient, seen today for interventional treatment. He  has a past surgical history that includes Colonoscopy; Fracture surgery (Left); Hernia repair; Lipoma excision; Vasectomy; Achilles tendon surgery (Right, 10/30/2015); Total knee arthroplasty (Bilateral, 04/19/2016); Colonoscopy with propofol (N/A, 04/07/2017); Knee arthrotomy (Left, 12/08/2017); I&D knee with poly exchange (Left, 12/08/2017); Total knee revision (Left, 06/11/2018); Joint replacement (Bilateral, 2017); and Total knee arthroplasty (Left, 09/11/2018). Mr. Schmid has a current medication list which includes the following prescription(s): acetaminophen, alpha-lipoic acid, amitriptyline, amlodipine-benazepril, aspirin ec, buspirone, chlorthalidone, duloxetine, folic acid, gemfibrozil, methocarbamol, multivitamin with minerals, multivitamin-lutein, nafcillin, naloxone, oxycodone hcl, paroxetine, pravastatin, ropinirole, vitamin b-12, vitamin c,  alprazolam, docusate sodium, enoxaparin, and oxycodone. His primarily concern today is the Leg Pain (bilateral)  Initial Vital Signs:  Pulse/HCG Rate:  ECG Heart Rate: 86 Temp: 99.1 F (37.3 C) Resp: 18 BP: (!) 170/107 SpO2: 94 %  BMI: Estimated body mass index is 40.62 kg/m as calculated from the following:   Height as of this encounter: 6\' 3"  (1.905 m).   Weight as of this encounter: 325 lb (147.4 kg).  Risk Assessment: Allergies: Reviewed. He is allergic to etodolac.  Allergy Precautions: None required Coagulopathies: Reviewed. None identified.  Blood-thinner therapy: None at this time Active Infection(s): Reviewed. None identified. Mr. Hardenbrook is afebrile  Site Confirmation: Mr. Climer was asked to confirm the procedure and laterality before marking the site Procedure checklist: Completed Consent: Before the procedure and under the influence of no sedative(s), amnesic(s), or anxiolytics, the patient was informed of the treatment options, risks and possible complications. To fulfill our ethical and legal obligations, as recommended by the American Medical Association's Code of Ethics, I have informed the patient of my clinical impression; the nature and purpose of the treatment or procedure; the risks, benefits, and possible complications of the intervention; the alternatives, including doing nothing; the risk(s) and benefit(s) of the alternative treatment(s) or procedure(s); and the risk(s) and benefit(s) of doing nothing. The patient was provided information about the general risks and possible complications associated with the procedure. These may include, but are not limited to: failure to achieve desired goals, infection, bleeding, organ or nerve damage, allergic reactions, paralysis, and death. In addition, the patient was informed of those risks and complications associated to Spine-related procedures, such as failure to decrease pain; infection (i.e.: Meningitis, epidural or  intraspinal abscess); bleeding (i.e.: epidural hematoma, subarachnoid hemorrhage, or any other type of intraspinal or peri-dural bleeding); organ or nerve damage (i.e.: Any type of peripheral nerve, nerve root, or spinal cord  injury) with subsequent damage to sensory, motor, and/or autonomic systems, resulting in permanent pain, numbness, and/or weakness of one or several areas of the body; allergic reactions; (i.e.: anaphylactic reaction); and/or death. Furthermore, the patient was informed of those risks and complications associated with the medications. These include, but are not limited to: allergic reactions (i.e.: anaphylactic or anaphylactoid reaction(s)); adrenal axis suppression; blood sugar elevation that in diabetics may result in ketoacidosis or comma; water retention that in patients with history of congestive heart failure may result in shortness of breath, pulmonary edema, and decompensation with resultant heart failure; weight gain; swelling or edema; medication-induced neural toxicity; particulate matter embolism and blood vessel occlusion with resultant organ, and/or nervous system infarction; and/or aseptic necrosis of one or more joints. Finally, the patient was informed that Medicine is not an exact science; therefore, there is also the possibility of unforeseen or unpredictable risks and/or possible complications that may result in a catastrophic outcome. The patient indicated having understood very clearly. We have given the patient no guarantees and we have made no promises. Enough time was given to the patient to ask questions, all of which were answered to the patient's satisfaction. Mr. Winterhalter has indicated that he wanted to continue with the procedure. Attestation: I, the ordering provider, attest that I have discussed with the patient the benefits, risks, side-effects, alternatives, likelihood of achieving goals, and potential problems during recovery for the procedure that I have  provided informed consent. Date  Time: 10/22/2018 12:25 PM  Pre-Procedure Preparation:  Monitoring: As per clinic protocol. Respiration, ETCO2, SpO2, BP, heart rate and rhythm monitor placed and checked for adequate function Safety Precautions: Patient was assessed for positional comfort and pressure points before starting the procedure. Time-out: I initiated and conducted the "Time-out" before starting the procedure, as per protocol. The patient was asked to participate by confirming the accuracy of the "Time Out" information. Verification of the correct person, site, and procedure were performed and confirmed by me, the nursing staff, and the patient. "Time-out" conducted as per Joint Commission's Universal Protocol (UP.01.01.01). Time: 1329  Description of Procedure:          Target Area: The interlaminar space, initially targeting the lower laminar border of the superior vertebral body. Approach: Paramedial approach. Area Prepped: Entire Posterior Lumbar Region Prepping solution: DuraPrep (Iodine Povacrylex [0.7% available iodine] and Isopropyl Alcohol, 74% w/w) Safety Precautions: Aspiration looking for blood return was conducted prior to all injections. At no point did we inject any substances, as a needle was being advanced. No attempts were made at seeking any paresthesias. Safe injection practices and needle disposal techniques used. Medications properly checked for expiration dates. SDV (single dose vial) medications used. Description of the Procedure: Protocol guidelines were followed. The procedure needle was introduced through the skin, ipsilateral to the reported pain, and advanced to the target area. Bone was contacted and the needle walked caudad, until the lamina was cleared. The epidural space was identified using "loss-of-resistance technique" with 2-3 ml of PF-NaCl (0.9% NSS), in a 5cc LOR glass syringe.  Vitals:   10/22/18 1233 10/22/18 1235 10/22/18 1333 10/22/18 1342  BP:  (!) 170/107 (!) 175/108 (!) 174/101 (!) 164/114  Resp: 18  (!) 24 20  Temp: 99.1 F (37.3 C)     TempSrc: Oral     SpO2: 94%  95% 96%  Weight: (!) 325 lb (147.4 kg)     Height: 6\' 3"  (1.905 m)       Start Time: 1329 hrs.  End Time: 1334 hrs.  Materials:  Needle(s) Type: Epidural needle Gauge: 17G Length: 5-in Medication(s): Please see orders for medications and dosing details. 8 cc solution consisting of 5 cc of preservative-free saline, 2 cc of 0.2% ropivacaine, 1 cc of Decadron 10 mg/cc.  Imaging Guidance (Spinal):          Type of Imaging Technique: Fluoroscopy Guidance (Spinal) Indication(s): Assistance in needle guidance and placement for procedures requiring needle placement in or near specific anatomical locations not easily accessible without such assistance. Exposure Time: Please see nurses notes. Contrast: Before injecting any contrast, we confirmed that the patient did not have an allergy to iodine, shellfish, or radiological contrast. Once satisfactory needle placement was completed at the desired level, radiological contrast was injected. Contrast injected under live fluoroscopy. No contrast complications. See chart for type and volume of contrast used. Fluoroscopic Guidance: I was personally present during the use of fluoroscopy. "Tunnel Vision Technique" used to obtain the best possible view of the target area. Parallax error corrected before commencing the procedure. "Direction-depth-direction" technique used to introduce the needle under continuous pulsed fluoroscopy. Once target was reached, antero-posterior, oblique, and lateral fluoroscopic projection used confirm needle placement in all planes. Images permanently stored in EMR. Interpretation: I personally interpreted the imaging intraoperatively. Adequate needle placement confirmed in multiple planes. Appropriate spread of contrast into desired area was observed. No evidence of afferent or efferent intravascular uptake.  No intrathecal or subarachnoid spread observed. Permanent images saved into the patient's record.  Antibiotic Prophylaxis:   Anti-infectives (From admission, onward)   None     Indication(s): None identified  Post-operative Assessment:  Post-procedure Vital Signs:  Pulse/HCG Rate:  81 Temp: 99.1 F (37.3 C) Resp: 20 BP: (!) 164/114 SpO2: 96 %  EBL: None  Complications: No immediate post-treatment complications observed by team, or reported by patient.  Note: The patient tolerated the entire procedure well. A repeat set of vitals were taken after the procedure and the patient was kept under observation following institutional policy, for this type of procedure. Post-procedural neurological assessment was performed, showing return to baseline, prior to discharge. The patient was provided with post-procedure discharge instructions, including a section on how to identify potential problems. Should any problems arise concerning this procedure, the patient was given instructions to immediately contact us, at any time, without hesitation. In any case, we plan to contact the patient by telephone for a follow-up status report regarding this interventional procedure.  Comments:  No additional relevant information. 5 out of 5 strength bilateral lower extremity: Plantar flexion, dorsiflexion, knee flexion, knee extension.  Plan of Care  Orders:  Orders Placed This Encounter  Procedures  . DG PAIN CLINIC C-ARM 1-60 MIN NO REPORT    Intraoperative interpretation by procedural physician at [place].    Standing Status:   Standing    Number of Occurrences:   1    Order Specific Question:   Reason for exam:    Answer:   Assistance in needle guidance and placement for procedures requiring needle placement in or near specific anatomical locations not easily accessible without such assistance.   Medications ordered for procedure: Meds ordered this encounter  Medications  . iohexol  (OMNIPAQUE) 180 MG/ML injection 10 mL    Must be Myelogram-compatible. If not available, you may substitute with a water-soluble, non-ionic, hypoallergenic, myelogram-compatible radiological contrast medium.  Marland Kitchen lidocaine (XYLOCAINE) 2 % (with pres) injection 400 mg  . sodium chloride flush (NS) 0.9 % injection 2 mL  .  ropivacaine (PF) 2 mg/mL (0.2%) (NAROPIN) injection 2 mL  . dexamethasone (DECADRON) injection 10 mg   Medications administered: We administered iohexol, lidocaine, sodium chloride flush, ropivacaine (PF) 2 mg/mL (0.2%), and dexamethasone.  See the medical record for exact dosing, route, and time of administration.  Disposition: Discharge home  Discharge Date & Time: 10/22/2018; 1345 hrs.   Follow-up plan:   Return in about 5 weeks (around 11/26/2018) for Post Procedure Evaluation.     Recent Visits Date Type Provider Dept  08/28/18 Office Visit Gillis Santa, MD [facility]  Showing recent visits within past 90 days and meeting all other requirements   Today's Visits Date Type Provider Dept  10/22/18 Procedure visit Gillis Santa, MD [facility]  Showing today's visits and meeting all other requirements   Future Appointments Date Type Provider Dept  11/27/18 Appointment Gillis Santa, MD [facility]  Showing future appointments within next 90 days and meeting all other requirements   Primary Care Physician: Juluis Pitch, MD Location: [facility] Outpatient [facility] Management Facility Note by: Gillis Santa, MD Date: 10/22/2018; Time: 3:52 PM  Disclaimer:  Medicine is not an exact science. The only guarantee in medicine is that nothing is guaranteed. It is important to note that the decision to proceed with this intervention was based on the information collected from the patient. The Data and conclusions were drawn from the patient's questionnaire, the interview, and the physical examination. Because the information was provided in large part  by the patient, it cannot be guaranteed that it has not been purposely or unconsciously manipulated. Every effort has been made to obtain as much relevant data as possible for this evaluation. It is important to note that the conclusions that lead to this procedure are derived in large part from the available data. Always take into account that the treatment will also be dependent on availability of resources and existing treatment guidelines, considered by other Pain Management Practitioners as being common knowledge and practice, at the time of the intervention. For Medico-Legal purposes, it is also important to point out that variation in procedural techniques and pharmacological choices are the acceptable norm. The indications, contraindications, technique, and results of the above procedure should only be interpreted and judged by a Board-Certified Interventional Pain Specialist with extensive familiarity and expertise in the same exact procedure and technique.

## 2018-10-23 ENCOUNTER — Telehealth: Payer: Self-pay

## 2018-10-23 NOTE — Telephone Encounter (Signed)
Post procedure phone call.  Patient states he is doing good.  

## 2018-10-24 ENCOUNTER — Other Ambulatory Visit
Admission: RE | Admit: 2018-10-24 | Discharge: 2018-10-24 | Disposition: A | Discharge status: Home / Self Care | Payer: 59 | Source: Ambulatory Visit | Attending: Orthopedic Surgery | Admitting: Orthopedic Surgery

## 2018-10-24 DIAGNOSIS — Z9889 Other specified postprocedural states: Secondary | ICD-10-CM | POA: Diagnosis not present

## 2018-10-27 LAB — BODY FLUID CULTURE
Culture: NO GROWTH
Gram Stain: NONE SEEN

## 2018-11-06 DIAGNOSIS — Z6841 Body Mass Index (BMI) 40.0 and over, adult: Secondary | ICD-10-CM | POA: Insufficient documentation

## 2018-11-19 ENCOUNTER — Encounter
Admission: RE | Admit: 2018-11-19 | Discharge: 2018-11-19 | Disposition: A | Discharge status: Home / Self Care | Payer: 59 | Source: Ambulatory Visit | Attending: Orthopedic Surgery | Admitting: Orthopedic Surgery

## 2018-11-19 ENCOUNTER — Other Ambulatory Visit: Payer: Self-pay

## 2018-11-19 DIAGNOSIS — Z01812 Encounter for preprocedural laboratory examination: Secondary | ICD-10-CM | POA: Insufficient documentation

## 2018-11-19 LAB — TYPE AND SCREEN
ABO/RH(D): A POS
Antibody Screen: NEGATIVE

## 2018-11-19 LAB — BASIC METABOLIC PANEL
Anion gap: 11 (ref 5–15)
BUN: 9 mg/dL (ref 6–20)
CO2: 27 mmol/L (ref 22–32)
Calcium: 9.4 mg/dL (ref 8.9–10.3)
Chloride: 100 mmol/L (ref 98–111)
Creatinine, Ser: 0.87 mg/dL (ref 0.61–1.24)
GFR calc Af Amer: >60 mL/min (ref >60)
GFR calc non Af Amer: >60 mL/min (ref >60)
Glucose, Bld: 95 mg/dL (ref 70–99)
Potassium: 3.3 mmol/L — ABNORMAL LOW (ref 3.5–5.1)
Sodium: 138 mmol/L (ref 135–145)

## 2018-11-19 LAB — CBC
HCT: 42 % (ref 39.0–52.0)
Hemoglobin: 14.1 g/dL (ref 13.0–17.0)
MCH: 29.4 pg (ref 26.0–34.0)
MCHC: 33.6 g/dL (ref 30.0–36.0)
MCV: 87.5 fL (ref 80.0–100.0)
Platelets: 295 10*3/uL (ref 150–400)
RBC: 4.8 MIL/uL (ref 4.22–5.81)
RDW: 13.5 % (ref 11.5–15.5)
WBC: 8.7 10*3/uL (ref 4.0–10.5)
nRBC: 0 % (ref 0.0–0.2)

## 2018-11-19 LAB — URINALYSIS, ROUTINE W REFLEX MICROSCOPIC
Bilirubin Urine: NEGATIVE
Glucose, UA: NEGATIVE mg/dL
Hgb urine dipstick: NEGATIVE
Ketones, ur: NEGATIVE mg/dL
Leukocytes,Ua: NEGATIVE
Nitrite: NEGATIVE
Protein, ur: NEGATIVE mg/dL
Specific Gravity, Urine: 1.013 (ref 1.005–1.030)
pH: 5 (ref 5.0–8.0)

## 2018-11-19 LAB — SURGICAL PCR SCREEN
MRSA, PCR: NEGATIVE
Staphylococcus aureus: NEGATIVE

## 2018-11-19 LAB — PROTIME-INR
INR: 1 (ref 0.8–1.2)
Prothrombin Time: 13.3 seconds (ref 11.4–15.2)

## 2018-11-19 LAB — SEDIMENTATION RATE: Sed Rate: 10 mm/hr (ref 0–20)

## 2018-11-19 LAB — APTT: aPTT: 31 seconds (ref 24–36)

## 2018-11-19 NOTE — Patient Instructions (Signed)
Your procedure is scheduled on: Tuesday 11/27/18  Report to [place]. To find out your arrival time please call [phone] between 1PM - 3PM on Monday 11/26/18   Remember: Instructions that are not followed completely may result in serious medical risk, up to and including death, or upon the discretion of your surgeon and anesthesiologist your surgery may need to be rescheduled.      _X__ 1. Do not eat food after midnight the night before your procedure.                 No gum chewing or hard candies. You may drink clear liquids up to 2 hours                 before you are scheduled to arrive for your surgery- DO NOT drink clear                 liquids within 2 hours of the start of your surgery.                 Clear Liquids include:  water, apple juice without pulp, clear carbohydrate                 drink such as Clearfast or Gatorade, Black Coffee or Tea (Do not add                 Milk or creamer to coffee or tea).  __X__2.  On the morning of surgery brush your teeth with toothpaste and water, you may rinse your mouth with mouthwash if you wish.  Do not swallow any toothpaste or mouthwash.     _X__ 3.  No Alcohol for 24 hours before or after surgery.   _X__ 4.  Do Not Smoke or use e-cigarettes For 24 Hours Prior to Your Surgery.                 Do not use any chewable tobacco products for at least 6 hours prior to                 surgery.  __X__5.  Notify your doctor if there is any change in your medical condition      (cold, fever, infections).     Do not wear jewelry, make-up, hairpins, clips or nail polish. Do not wear lotions, powders, or perfumes.  Do not shave 48 hours prior to surgery. Men may shave face and neck. Do not bring valuables to the hospital.    [facility] is not responsible for any belongings or valuables.  Contacts, dentures/partials or body piercings may not be worn into surgery. Bring a case for  your contacts, glasses or hearing aids, a denture cup will be supplied. Leave your suitcase in the car. After surgery it may be brought to your room.  For patients admitted to the hospital, discharge time is determined by your treatment team.     Please read over the following fact sheets that you were given:   MRSA Information  __X__ Take these medicines the morning of surgery with A SIP OF WATER:     1. busPIRone (BUSPAR) 5 MG tablet  2. DULoxetine (CYMBALTA) 60 MG capsule  3. PARoxetine (PAXIL) 20 MG tablet      __X__ Use CHG SAGE wipes as directed    __X__ Stop Blood Thinners Coumadin/Plavix/Xarelto/Pleta/Pradaxa/Eliquis/Effient/Aspirin    __X__ Stop Anti-inflammatories 7 days before surgery such as Advil, Ibuprofen, Motrin,  BC or Goodies Powder, Naprosyn, Naproxen, Aleve, Aspirin, Meloxicam. May take Tylenol if needed for pain or discomfort.    __X__ Stop taking your Vitamin C until after your procedure.

## 2018-11-20 LAB — URINE CULTURE: Culture: NO GROWTH

## 2018-11-22 ENCOUNTER — Encounter: Payer: Self-pay | Admitting: Student in an Organized Health Care Education/Training Program

## 2018-11-23 ENCOUNTER — Other Ambulatory Visit: Payer: Self-pay

## 2018-11-23 ENCOUNTER — Other Ambulatory Visit
Admission: RE | Admit: 2018-11-23 | Discharge: 2018-11-23 | Disposition: A | Discharge status: Home / Self Care | Payer: 59 | Source: Ambulatory Visit | Attending: Orthopedic Surgery | Admitting: Orthopedic Surgery

## 2018-11-23 DIAGNOSIS — Z1159 Encounter for screening for other viral diseases: Secondary | ICD-10-CM | POA: Insufficient documentation

## 2018-11-24 LAB — SARS CORONAVIRUS 2 (TAT 6-24 HRS): SARS Coronavirus 2: NEGATIVE

## 2018-11-26 ENCOUNTER — Other Ambulatory Visit: Payer: Self-pay

## 2018-11-26 ENCOUNTER — Encounter: Payer: Self-pay | Admitting: Student in an Organized Health Care Education/Training Program

## 2018-11-26 ENCOUNTER — Ambulatory Visit
Payer: 59 | Attending: Student in an Organized Health Care Education/Training Program | Admitting: Student in an Organized Health Care Education/Training Program

## 2018-11-26 DIAGNOSIS — G8929 Other chronic pain: Secondary | ICD-10-CM

## 2018-11-26 DIAGNOSIS — G608 Other hereditary and idiopathic neuropathies: Secondary | ICD-10-CM

## 2018-11-26 DIAGNOSIS — M5416 Radiculopathy, lumbar region: Secondary | ICD-10-CM

## 2018-11-26 MED ORDER — TRANEXAMIC ACID-NACL 1000-0.7 MG/100ML-% IV SOLN
1000.0000 mg | INTRAVENOUS | Status: AC
  Administered 2018-11-27: 1000 mg via INTRAVENOUS
  Filled 2018-11-26: qty 100

## 2018-11-26 MED ORDER — DEXTROSE 5 % IV SOLN
3.0000 g | Freq: Once | INTRAVENOUS | Status: AC
  Administered 2018-11-27: 3 g via INTRAVENOUS
  Filled 2018-11-26: qty 3

## 2018-11-26 NOTE — Progress Notes (Signed)
Pain Management Virtual Encounter Note - Virtual Visit via Telephone Telehealth (real-time audio visits between healthcare provider and patient).   Patient's Phone No. & Preferred Pharmacy:  [phone] (home); [phone] (mobile); (Preferred) [phone] No e-mail address on record  [address], [place] [address] Phone: [phone] Fax: [phone]    Pre-screening note:  Our staff contacted Nathan Chambers and offered him an "in person", "face-to-face" appointment versus a telephone encounter. He indicated preferring the telephone encounter, at this time.   Reason for Virtual Visit: COVID-19*  Social distancing based on CDC and AMA recommendations.   I contacted Nathan Chambers on 11/26/2018 via telephone.      I clearly identified myself as Gillis Santa, MD. I verified that I was speaking with the correct person using two identifiers (Name: Nathan Chambers, and date of birth: 1965/12/02).  Advanced Informed Consent I sought verbal advanced consent from Nathan Chambers for virtual visit interactions. I informed Nathan Chambers of possible security and privacy concerns, risks, and limitations associated with providing "not-in-person" medical evaluation and management services. I also informed Nathan Chambers of the availability of "in-person" appointments. Finally, I informed him that there would be a charge for the virtual visit and that he could be  personally, fully or partially, financially responsible for it. Nathan Chambers expressed understanding and agreed to proceed.   Historic Elements   Nathan Chambers is a 53 y.o. year old, male patient evaluated today after his last encounter by our practice on 10/23/2018. Nathan Chambers  has a past medical history of Anxiety, Arthritis, Bipolar disorder ([place]), Chronic kidney disease, Complication of anesthesia, GERD (gastroesophageal reflux disease),  Headache, Hemorrhoids, Hyperlipidemia, Hypertension, LFT elevation, and NAFLD (nonalcoholic fatty liver disease). He also  has a past surgical history that includes Colonoscopy; Fracture surgery (Left); Hernia repair; Lipoma excision; Vasectomy; Achilles tendon surgery (Right, 10/30/2015); Total knee arthroplasty (Bilateral, 04/19/2016); Colonoscopy with propofol (N/A, 04/07/2017); Knee arthrotomy (Left, 12/08/2017); I&D knee with poly exchange (Left, 12/08/2017); Total knee revision (Left, 06/11/2018); Joint replacement (Bilateral, 2017); and Total knee arthroplasty (Left, 09/11/2018). Nathan Chambers has a current medication list which includes the following prescription(s): acetaminophen, amitriptyline, amlodipine-benazepril, aspirin ec, buspirone, chlorthalidone, docusate sodium, doxepin, duloxetine, folic acid, gemfibrozil, meloxicam, multivitamin with minerals, multivitamin-lutein, naloxone, oxycodone, oxycodone hcl, paroxetine, pravastatin, vitamin b-12, vitamin c, and enoxaparin. He  reports that he has never smoked. He quit smokeless tobacco use about 7 years ago.  His smokeless tobacco use included chew. He reports that he does not drink alcohol or use drugs. Nathan Chambers is allergic to etodolac.   HPI  Today, he is being contacted for a post-procedure assessment.  Evaluation of last interventional procedure  10/22/2018 Procedure: L4-5 (LEFT) ESI Pre-procedure pain score:  10/10 Post-procedure pain score: 9/10         Influential Factors: Intra-procedural challenges: None observed.         Reported side-effects: None.        Post-procedural adverse reactions or complications: None reported         Sedation: Please see nurses note for DOS. When no sedatives are used, the analgesic levels obtained are directly associated to the effectiveness of the local anesthetics. However, when sedation is provided, the level of analgesia obtained during the initial 1 hour following the intervention, is believed to be the  result of a combination of factors. These  factors may include, but are not limited to: 1. The effectiveness of the local anesthetics used. 2. The effects of the analgesic(s) and/or anxiolytic(s) used. 3. The degree of discomfort experienced by the patient at the time of the procedure. 4. The patients ability and reliability in recalling and recording the events. 5. The presence and influence of possible secondary gains and/or psychosocial factors. Reported result: Relief experienced during the 1st hour after the procedure:100%   (Ultra-Short Term Relief)            Interpretative annotation: Clinically appropriate result. Analgesia during this period is likely to be Local Anesthetic and/or IV Sedative (Analgesic/Anxiolytic) related.          Effects of local anesthetic: The analgesic effects attained during this period are directly associated to the localized infiltration of local anesthetics and therefore cary significant diagnostic value as to the etiological location, or anatomical origin, of the pain. Expected duration of relief is directly dependent on the pharmacodynamics of the local anesthetic used. Long-acting (4-6 hours) anesthetics used.  Reported result: Relief during the next 4 to 6 hour after the procedure:80%   (Short-Term Relief)            Interpretative annotation: Clinically appropriate result. Analgesia during this period is likely to be Local Anesthetic-related.          Long-term benefit: Defined as the period of time past the expected duration of local anesthetics (1 hour for short-acting and 4-6 hours for long-acting). With the possible exception of prolonged sympathetic blockade from the local anesthetics, benefits during this period are typically attributed to, or associated with, other factors such as analgesic sensory neuropraxia, antiinflammatory effects, or beneficial biochemical changes provided by agents other than the local anesthetics.  Reported result: Extended relief  following procedure:   40% (states that foot pain has improved) (Long-Term Relief)            Interpretative annotation: Clinically appropriate result. Partial relief. No permanent benefit expected. Inflammation plays a part in the etiology to the pain.           Pertinent Labs   SAFETY SCREENING Profile Lab Results  Component Value Date   SARSCOV2NAA NEGATIVE 11/23/2018   COVIDSOURCE NASOPHARYNGEAL 10/17/2018   STAPHAUREUS NEGATIVE 11/19/2018   MRSAPCR NEGATIVE 11/19/2018   HIV Non Reactive 12/09/2017   Renal Function Lab Results  Component Value Date   BUN 9 11/19/2018   CREATININE 0.87 11/19/2018   GFRAA >60 11/19/2018   GFRNONAA >60 11/19/2018   Hepatic Function Lab Results  Component Value Date   AST 32 08/09/2018   ALT 39 08/09/2018   ALBUMIN 4.6 08/09/2018   UDS No results found for: SUMMARY Note: Above Lab results reviewed.   Assessment  The primary encounter diagnosis was Chronic radicular lumbar pain. Diagnoses of Lumbar radicular pain and Sensory polyneuropathy were also pertinent to this visit.  Virtual postprocedural encounter status post left L4-L5 ESI 1.  Patient states that his low back as well as radiating leg pain is pretty much the same however he does endorse improvement in his burning ankle and bilateral foot pain.  Patient is scheduled for left knee revision with Dr. Rudene Christians on 11/27/2018.  I informed the patient that I do not want to perform any additional epidural steroid injections while he is in the perioperative state.  I instructed the patient to touch base with me in the fall after his knee surgery and once he has been cleared from a postoperative standpoint.  Patient endorsed understanding.  Plan of Care  I am having Nathan Chambers maintain his gemfibrozil, vitamin C, folic acid, vitamin Y-09, multivitamin-lutein, naloxone, amLODipine-benazepril, chlorthalidone, busPIRone, DULoxetine, aspirin EC, amitriptyline, multivitamin with minerals,  pravastatin, enoxaparin, docusate sodium, oxyCODONE, Oxycodone HCl, acetaminophen, PARoxetine, meloxicam, and doxepin.   Follow-up plan:   Return if symptoms worsen or fail to improve.      Recent Visits Date Type Provider Dept  10/22/18 Procedure visit Gillis Santa, MD [facility]  08/28/18 Office Visit Gillis Santa, MD [facility]  Showing recent visits within past 90 days and meeting all other requirements   Today's Visits Date Type Provider Dept  11/26/18 Office Visit Gillis Santa, MD [facility]  Showing today's visits and meeting all other requirements   Future Appointments No visits were found meeting these conditions.  Showing future appointments within next 90 days and meeting all other requirements   I discussed the assessment and treatment plan with the patient. The patient was provided an opportunity to ask questions and all were answered. The patient agreed with the plan and demonstrated an understanding of the instructions.  Patient advised to call back or seek an in-person evaluation if the symptoms or condition worsens.  Total duration of non-face-to-face encounter: 46minutes.  Note by: Gillis Santa, MD Date: 11/26/2018; Time: 1:22 PM  Note: This dictation was prepared with Dragon dictation. Any transcriptional errors that may result from this process are unintentional.  Disclaimer:  * Given the special circumstances of the COVID-19 pandemic, the federal government has announced that the Office for Civil Rights (OCR) will exercise its enforcement discretion and will not impose penalties on physicians using telehealth in the event of noncompliance with regulatory requirements under the [place] (HIPAA) in connection with the good faith provision of telehealth during the XIPJA-25 national public health emergency. ([place])

## 2018-11-27 ENCOUNTER — Inpatient Hospital Stay: Payer: 59

## 2018-11-27 ENCOUNTER — Ambulatory Visit: Payer: 59 | Admitting: Student in an Organized Health Care Education/Training Program

## 2018-11-27 ENCOUNTER — Inpatient Hospital Stay: Payer: 59 | Admitting: Anesthesiology

## 2018-11-27 ENCOUNTER — Encounter: Payer: Self-pay | Admitting: *Deleted

## 2018-11-27 ENCOUNTER — Encounter
Admission: RE | Disposition: A | Discharge status: Home Health | Payer: Self-pay | Source: Home / Self Care | Attending: Orthopedic Surgery

## 2018-11-27 ENCOUNTER — Other Ambulatory Visit: Payer: Self-pay

## 2018-11-27 ENCOUNTER — Inpatient Hospital Stay
Admission: RE | Admit: 2018-11-27 | Discharge: 2018-11-30 | DRG: 467 | Disposition: A | Discharge status: Home Health | Payer: 59 | Attending: Orthopedic Surgery | Admitting: Orthopedic Surgery

## 2018-11-27 DIAGNOSIS — E669 Obesity, unspecified: Secondary | ICD-10-CM | POA: Diagnosis present

## 2018-11-27 DIAGNOSIS — F319 Bipolar disorder, unspecified: Secondary | ICD-10-CM | POA: Diagnosis present

## 2018-11-27 DIAGNOSIS — Z6841 Body Mass Index (BMI) 40.0 and over, adult: Secondary | ICD-10-CM

## 2018-11-27 DIAGNOSIS — F419 Anxiety disorder, unspecified: Secondary | ICD-10-CM | POA: Diagnosis not present

## 2018-11-27 DIAGNOSIS — I10 Essential (primary) hypertension: Secondary | ICD-10-CM

## 2018-11-27 DIAGNOSIS — M00062 Staphylococcal arthritis, left knee: Secondary | ICD-10-CM | POA: Diagnosis not present

## 2018-11-27 DIAGNOSIS — Z8619 Personal history of other infectious and parasitic diseases: Secondary | ICD-10-CM

## 2018-11-27 DIAGNOSIS — T8454XD Infection and inflammatory reaction due to internal left knee prosthesis, subsequent encounter: Secondary | ICD-10-CM | POA: Diagnosis not present

## 2018-11-27 DIAGNOSIS — Z4733 Aftercare following explantation of knee joint prosthesis: Secondary | ICD-10-CM | POA: Diagnosis present

## 2018-11-27 DIAGNOSIS — Z79899 Other long term (current) drug therapy: Secondary | ICD-10-CM

## 2018-11-27 DIAGNOSIS — N189 Chronic kidney disease, unspecified: Secondary | ICD-10-CM | POA: Diagnosis present

## 2018-11-27 DIAGNOSIS — I959 Hypotension, unspecified: Secondary | ICD-10-CM | POA: Diagnosis not present

## 2018-11-27 DIAGNOSIS — I129 Hypertensive chronic kidney disease with stage 1 through stage 4 chronic kidney disease, or unspecified chronic kidney disease: Secondary | ICD-10-CM | POA: Diagnosis present

## 2018-11-27 DIAGNOSIS — Z96653 Presence of artificial knee joint, bilateral: Secondary | ICD-10-CM

## 2018-11-27 DIAGNOSIS — F339 Major depressive disorder, recurrent, unspecified: Secondary | ICD-10-CM

## 2018-11-27 DIAGNOSIS — Z87891 Personal history of nicotine dependence: Secondary | ICD-10-CM | POA: Diagnosis not present

## 2018-11-27 DIAGNOSIS — K219 Gastro-esophageal reflux disease without esophagitis: Secondary | ICD-10-CM | POA: Diagnosis present

## 2018-11-27 DIAGNOSIS — Z96652 Presence of left artificial knee joint: Secondary | ICD-10-CM | POA: Diagnosis not present

## 2018-11-27 DIAGNOSIS — E876 Hypokalemia: Secondary | ICD-10-CM | POA: Diagnosis present

## 2018-11-27 DIAGNOSIS — Z20828 Contact with and (suspected) exposure to other viral communicable diseases: Secondary | ICD-10-CM | POA: Diagnosis present

## 2018-11-27 DIAGNOSIS — Z9889 Other specified postprocedural states: Secondary | ICD-10-CM | POA: Diagnosis not present

## 2018-11-27 DIAGNOSIS — G8918 Other acute postprocedural pain: Secondary | ICD-10-CM

## 2018-11-27 DIAGNOSIS — Z96659 Presence of unspecified artificial knee joint: Secondary | ICD-10-CM

## 2018-11-27 DIAGNOSIS — K760 Fatty (change of) liver, not elsewhere classified: Secondary | ICD-10-CM | POA: Diagnosis present

## 2018-11-27 DIAGNOSIS — Z888 Allergy status to other drugs, medicaments and biological substances status: Secondary | ICD-10-CM

## 2018-11-27 DIAGNOSIS — E785 Hyperlipidemia, unspecified: Secondary | ICD-10-CM | POA: Diagnosis present

## 2018-11-27 DIAGNOSIS — B9561 Methicillin susceptible Staphylococcus aureus infection as the cause of diseases classified elsewhere: Secondary | ICD-10-CM

## 2018-11-27 DIAGNOSIS — F418 Other specified anxiety disorders: Secondary | ICD-10-CM | POA: Diagnosis present

## 2018-11-27 DIAGNOSIS — Z978 Presence of other specified devices: Secondary | ICD-10-CM | POA: Diagnosis not present

## 2018-11-27 HISTORY — PX: TOTAL KNEE REVISION: SHX996

## 2018-11-27 LAB — CBC
HCT: 40.4 % (ref 39.0–52.0)
Hemoglobin: 13.2 g/dL (ref 13.0–17.0)
MCH: 29.3 pg (ref 26.0–34.0)
MCHC: 32.7 g/dL (ref 30.0–36.0)
MCV: 89.6 fL (ref 80.0–100.0)
Platelets: 278 10*3/uL (ref 150–400)
RBC: 4.51 MIL/uL (ref 4.22–5.81)
RDW: 13.3 % (ref 11.5–15.5)
WBC: 15 10*3/uL — ABNORMAL HIGH (ref 4.0–10.5)
nRBC: 0 % (ref 0.0–0.2)

## 2018-11-27 LAB — CREATININE, SERUM
Creatinine, Ser: 0.9 mg/dL (ref 0.61–1.24)
GFR calc Af Amer: >60 mL/min (ref >60)
GFR calc non Af Amer: >60 mL/min (ref >60)

## 2018-11-27 SURGERY — TOTAL KNEE REVISION
Anesthesia: Spinal | Site: Knee | Laterality: Left

## 2018-11-27 MED ORDER — FAMOTIDINE 20 MG PO TABS
ORAL_TABLET | ORAL | Status: AC
  Administered 2018-11-27: 20 mg via ORAL
  Filled 2018-11-27: qty 1

## 2018-11-27 MED ORDER — BENAZEPRIL HCL 20 MG PO TABS
40.0000 mg | ORAL_TABLET | Freq: Every day | ORAL | Status: DC
  Administered 2018-11-27 – 2018-11-30 (×2): 40 mg via ORAL
  Filled 2018-11-27 (×4): qty 2

## 2018-11-27 MED ORDER — DOCUSATE SODIUM 100 MG PO CAPS
100.0000 mg | ORAL_CAPSULE | Freq: Two times a day (BID) | ORAL | Status: DC
  Administered 2018-11-27 – 2018-11-30 (×6): 100 mg via ORAL
  Filled 2018-11-27 (×6): qty 1

## 2018-11-27 MED ORDER — CEFAZOLIN SODIUM-DEXTROSE 2-4 GM/100ML-% IV SOLN
2.0000 g | Freq: Three times a day (TID) | INTRAVENOUS | Status: DC
  Filled 2018-11-27 (×2): qty 100

## 2018-11-27 MED ORDER — ONDANSETRON HCL 4 MG PO TABS
4.0000 mg | ORAL_TABLET | Freq: Four times a day (QID) | ORAL | Status: DC | PRN
  Administered 2018-11-28: 4 mg via ORAL
  Filled 2018-11-27: qty 1

## 2018-11-27 MED ORDER — ZOLPIDEM TARTRATE 5 MG PO TABS
5.0000 mg | ORAL_TABLET | Freq: Every evening | ORAL | Status: DC | PRN
  Administered 2018-11-28: 5 mg via ORAL
  Filled 2018-11-27: qty 1

## 2018-11-27 MED ORDER — HYDROMORPHONE HCL 1 MG/ML IJ SOLN
0.5000 mg | INTRAMUSCULAR | Status: DC | PRN
  Administered 2018-11-27: 1 mg via INTRAVENOUS
  Administered 2018-11-27 (×2): 0.5 mg via INTRAVENOUS
  Administered 2018-11-28: 1 mg via INTRAVENOUS
  Filled 2018-11-27 (×5): qty 1

## 2018-11-27 MED ORDER — OXYCODONE HCL ER 15 MG PO T12A
15.0000 mg | EXTENDED_RELEASE_TABLET | Freq: Two times a day (BID) | ORAL | Status: DC
  Administered 2018-11-27 – 2018-11-30 (×6): 15 mg via ORAL
  Filled 2018-11-27 (×8): qty 1

## 2018-11-27 MED ORDER — SODIUM CHLORIDE 0.9 % IV SOLN
INTRAVENOUS | Status: DC | PRN
  Administered 2018-11-27: 60 mL

## 2018-11-27 MED ORDER — BUPIVACAINE LIPOSOME 1.3 % IJ SUSP
INTRAMUSCULAR | Status: AC
  Filled 2018-11-27: qty 20

## 2018-11-27 MED ORDER — SODIUM CHLORIDE (PF) 0.9 % IJ SOLN
INTRAMUSCULAR | Status: AC
  Filled 2018-11-27: qty 50

## 2018-11-27 MED ORDER — METOCLOPRAMIDE HCL 10 MG PO TABS: 5.0000 mg | ORAL_TABLET | Freq: Three times a day (TID) | ORAL | Status: DC | PRN

## 2018-11-27 MED ORDER — BUPIVACAINE HCL (PF) 0.5 % IJ SOLN
INTRAMUSCULAR | Status: DC | PRN
  Administered 2018-11-27: 3 mL

## 2018-11-27 MED ORDER — PHENYLEPHRINE HCL (PRESSORS) 10 MG/ML IV SOLN
INTRAVENOUS | Status: DC | PRN
  Administered 2018-11-27 (×3): 100 ug via INTRAVENOUS

## 2018-11-27 MED ORDER — BISACODYL 5 MG PO TBEC
5.0000 mg | DELAYED_RELEASE_TABLET | Freq: Every day | ORAL | Status: DC | PRN
  Administered 2018-11-29: 5 mg via ORAL
  Filled 2018-11-27: qty 1

## 2018-11-27 MED ORDER — MAGNESIUM CITRATE PO SOLN
1.0000 | Freq: Once | ORAL | Status: DC | PRN
  Filled 2018-11-27: qty 296

## 2018-11-27 MED ORDER — EPHEDRINE SULFATE 50 MG/ML IJ SOLN
INTRAMUSCULAR | Status: DC | PRN
  Administered 2018-11-27 (×2): 10 mg via INTRAVENOUS
  Administered 2018-11-27: 5 mg via INTRAVENOUS
  Administered 2018-11-27: 10 mg via INTRAVENOUS

## 2018-11-27 MED ORDER — ENOXAPARIN SODIUM 30 MG/0.3ML ~~LOC~~ SOLN
30.0000 mg | Freq: Two times a day (BID) | SUBCUTANEOUS | Status: DC
  Administered 2018-11-28 – 2018-11-30 (×5): 30 mg via SUBCUTANEOUS
  Filled 2018-11-27 (×5): qty 0.3

## 2018-11-27 MED ORDER — ONDANSETRON HCL 4 MG/2ML IJ SOLN
INTRAMUSCULAR | Status: DC | PRN
  Administered 2018-11-27: 4 mg via INTRAVENOUS

## 2018-11-27 MED ORDER — FENTANYL CITRATE (PF) 100 MCG/2ML IJ SOLN: 25.0000 ug | INTRAMUSCULAR | Status: DC | PRN

## 2018-11-27 MED ORDER — ONDANSETRON HCL 4 MG/2ML IJ SOLN: 4.0000 mg | Freq: Four times a day (QID) | INTRAMUSCULAR | Status: DC | PRN

## 2018-11-27 MED ORDER — DULOXETINE HCL 60 MG PO CPEP
60.0000 mg | ORAL_CAPSULE | ORAL | Status: DC
  Administered 2018-11-28 – 2018-11-30 (×3): 60 mg via ORAL
  Filled 2018-11-27 (×3): qty 1

## 2018-11-27 MED ORDER — MAGNESIUM HYDROXIDE 400 MG/5ML PO SUSP
30.0000 mL | Freq: Every day | ORAL | Status: DC | PRN
  Administered 2018-11-29: 30 mL via ORAL
  Filled 2018-11-27: qty 30

## 2018-11-27 MED ORDER — AMLODIPINE BESY-BENAZEPRIL HCL 10-40 MG PO CAPS: 1.0000 | ORAL_CAPSULE | ORAL | Status: DC

## 2018-11-27 MED ORDER — METOCLOPRAMIDE HCL 5 MG/ML IJ SOLN: 5.0000 mg | Freq: Three times a day (TID) | INTRAMUSCULAR | Status: DC | PRN

## 2018-11-27 MED ORDER — TETRACAINE HCL 1 % IJ SOLN
INTRAMUSCULAR | Status: DC | PRN
  Administered 2018-11-27: 5 mg via INTRASPINAL

## 2018-11-27 MED ORDER — CYANOCOBALAMIN 500 MCG PO TABS
250.0000 ug | ORAL_TABLET | Freq: Every day | ORAL | Status: DC
  Administered 2018-11-28 – 2018-11-30 (×3): 250 ug via ORAL
  Filled 2018-11-27 (×4): qty 1

## 2018-11-27 MED ORDER — PHENOL 1.4 % MT LIQD
1.0000 | OROMUCOSAL | Status: DC | PRN
  Filled 2018-11-27: qty 177

## 2018-11-27 MED ORDER — PROPOFOL 10 MG/ML IV BOLUS
INTRAVENOUS | Status: DC | PRN
  Administered 2018-11-27: 40 mg via INTRAVENOUS

## 2018-11-27 MED ORDER — ADULT MULTIVITAMIN W/MINERALS CH
1.0000 | ORAL_TABLET | Freq: Every day | ORAL | Status: DC
  Administered 2018-11-28 – 2018-11-30 (×3): 1 via ORAL
  Filled 2018-11-27 (×4): qty 1

## 2018-11-27 MED ORDER — ONDANSETRON HCL 4 MG/2ML IJ SOLN: 4.0000 mg | Freq: Once | INTRAMUSCULAR | Status: DC | PRN

## 2018-11-27 MED ORDER — FOLIC ACID 1 MG PO TABS
1.0000 mg | ORAL_TABLET | Freq: Every day | ORAL | Status: DC
  Administered 2018-11-28 – 2018-11-30 (×3): 1 mg via ORAL
  Filled 2018-11-27 (×3): qty 1

## 2018-11-27 MED ORDER — MIDAZOLAM HCL 5 MG/5ML IJ SOLN
INTRAMUSCULAR | Status: DC | PRN
  Administered 2018-11-27: 5 mg via INTRAVENOUS

## 2018-11-27 MED ORDER — METHOCARBAMOL 1000 MG/10ML IJ SOLN
500.0000 mg | Freq: Four times a day (QID) | INTRAVENOUS | Status: DC | PRN
  Filled 2018-11-27: qty 5

## 2018-11-27 MED ORDER — PAROXETINE HCL 20 MG PO TABS
40.0000 mg | ORAL_TABLET | Freq: Every day | ORAL | Status: DC
  Administered 2018-11-28 – 2018-11-30 (×3): 40 mg via ORAL
  Filled 2018-11-27 (×3): qty 2

## 2018-11-27 MED ORDER — VANCOMYCIN HCL 1000 MG IV SOLR
INTRAVENOUS | Status: AC
  Filled 2018-11-27: qty 1000

## 2018-11-27 MED ORDER — ACETAMINOPHEN 325 MG PO TABS: 325.0000 mg | ORAL_TABLET | Freq: Four times a day (QID) | ORAL | Status: DC | PRN

## 2018-11-27 MED ORDER — SODIUM CHLORIDE 0.9 % IV SOLN
INTRAVENOUS | Status: DC
  Administered 2018-11-29: 21:00:00 via INTRAVENOUS

## 2018-11-27 MED ORDER — ALUM & MAG HYDROXIDE-SIMETH 200-200-20 MG/5ML PO SUSP
30.0000 mL | ORAL | Status: DC | PRN
  Administered 2018-11-28: 30 mL via ORAL
  Filled 2018-11-27: qty 30

## 2018-11-27 MED ORDER — STERILE WATER FOR IRRIGATION IR SOLN
Status: DC | PRN
  Administered 2018-11-27 (×2): 1000 mL

## 2018-11-27 MED ORDER — DEXTROSE 5 % IV SOLN
3.0000 g | Freq: Three times a day (TID) | INTRAVENOUS | Status: DC
  Administered 2018-11-27 – 2018-11-28 (×3): 3 g via INTRAVENOUS
  Filled 2018-11-27 (×2): qty 3
  Filled 2018-11-27 (×3): qty 3000
  Filled 2018-11-27: qty 3

## 2018-11-27 MED ORDER — AMITRIPTYLINE HCL 25 MG PO TABS
50.0000 mg | ORAL_TABLET | Freq: Every day | ORAL | Status: DC
  Administered 2018-11-27 – 2018-11-29 (×3): 50 mg via ORAL
  Filled 2018-11-27 (×3): qty 2

## 2018-11-27 MED ORDER — VITAMIN C 500 MG PO TABS
500.0000 mg | ORAL_TABLET | Freq: Every day | ORAL | Status: DC
  Administered 2018-11-28 – 2018-11-30 (×3): 500 mg via ORAL
  Filled 2018-11-27 (×3): qty 1

## 2018-11-27 MED ORDER — PROPOFOL 500 MG/50ML IV EMUL
INTRAVENOUS | Status: DC | PRN
  Administered 2018-11-27: 150 ug/kg/min via INTRAVENOUS

## 2018-11-27 MED ORDER — VASOPRESSIN 20 UNIT/ML IV SOLN
INTRAVENOUS | Status: DC | PRN
  Administered 2018-11-27: 1 [IU] via INTRAVENOUS
  Administered 2018-11-27: 2 [IU] via INTRAVENOUS
  Administered 2018-11-27: 1 [IU] via INTRAVENOUS
  Administered 2018-11-27 (×2): 2 [IU] via INTRAVENOUS
  Administered 2018-11-27 (×2): 1 [IU] via INTRAVENOUS

## 2018-11-27 MED ORDER — OXYCODONE HCL 5 MG PO TABS
ORAL_TABLET | ORAL | Status: AC
  Filled 2018-11-27: qty 1

## 2018-11-27 MED ORDER — PRAVASTATIN SODIUM 20 MG PO TABS
20.0000 mg | ORAL_TABLET | Freq: Every evening | ORAL | Status: DC
  Administered 2018-11-27 – 2018-11-29 (×3): 20 mg via ORAL
  Filled 2018-11-27 (×3): qty 1

## 2018-11-27 MED ORDER — LIDOCAINE HCL (CARDIAC) PF 100 MG/5ML IV SOSY
PREFILLED_SYRINGE | INTRAVENOUS | Status: DC | PRN
  Administered 2018-11-27: 100 mg via INTRAVENOUS

## 2018-11-27 MED ORDER — BUPIVACAINE-EPINEPHRINE (PF) 0.25% -1:200000 IJ SOLN
INTRAMUSCULAR | Status: AC
  Filled 2018-11-27: qty 30

## 2018-11-27 MED ORDER — GEMFIBROZIL 600 MG PO TABS
600.0000 mg | ORAL_TABLET | Freq: Two times a day (BID) | ORAL | Status: DC
  Administered 2018-11-27 – 2018-11-30 (×6): 600 mg via ORAL
  Filled 2018-11-27 (×7): qty 1

## 2018-11-27 MED ORDER — DOXEPIN HCL 10 MG PO CAPS
20.0000 mg | ORAL_CAPSULE | Freq: Every day | ORAL | Status: DC
  Administered 2018-11-27 – 2018-11-29 (×3): 20 mg via ORAL
  Filled 2018-11-27 (×4): qty 2

## 2018-11-27 MED ORDER — MENTHOL 3 MG MT LOZG
1.0000 | LOZENGE | OROMUCOSAL | Status: DC | PRN
  Filled 2018-11-27: qty 9

## 2018-11-27 MED ORDER — CHLORTHALIDONE 25 MG PO TABS
12.5000 mg | ORAL_TABLET | Freq: Every day | ORAL | Status: DC
  Administered 2018-11-29 – 2018-11-30 (×2): 12.5 mg via ORAL
  Filled 2018-11-27 (×3): qty 0.5

## 2018-11-27 MED ORDER — PROPOFOL 500 MG/50ML IV EMUL
INTRAVENOUS | Status: AC
  Filled 2018-11-27: qty 50

## 2018-11-27 MED ORDER — DIPHENHYDRAMINE HCL 12.5 MG/5ML PO ELIX: 12.5000 mg | ORAL_SOLUTION | ORAL | Status: DC | PRN

## 2018-11-27 MED ORDER — METHOCARBAMOL 500 MG PO TABS
500.0000 mg | ORAL_TABLET | Freq: Four times a day (QID) | ORAL | Status: DC | PRN
  Administered 2018-11-27 – 2018-11-28 (×4): 500 mg via ORAL
  Filled 2018-11-27 (×4): qty 1

## 2018-11-27 MED ORDER — FAMOTIDINE 20 MG PO TABS
20.0000 mg | ORAL_TABLET | Freq: Once | ORAL | Status: AC
  Administered 2018-11-27: 20 mg via ORAL

## 2018-11-27 MED ORDER — MIDAZOLAM HCL 5 MG/5ML IJ SOLN
INTRAMUSCULAR | Status: AC
  Filled 2018-11-27: qty 5

## 2018-11-27 MED ORDER — LACTATED RINGERS IV SOLN
INTRAVENOUS | Status: DC
  Administered 2018-11-27 (×5): via INTRAVENOUS

## 2018-11-27 MED ORDER — PROPOFOL 10 MG/ML IV BOLUS
INTRAVENOUS | Status: AC
  Filled 2018-11-27: qty 20

## 2018-11-27 MED ORDER — TRAMADOL HCL 50 MG PO TABS
50.0000 mg | ORAL_TABLET | Freq: Four times a day (QID) | ORAL | Status: DC
  Administered 2018-11-27 – 2018-11-30 (×5): 50 mg via ORAL
  Filled 2018-11-27 (×6): qty 1

## 2018-11-27 MED ORDER — OXYCODONE HCL 5 MG PO TABS
5.0000 mg | ORAL_TABLET | ORAL | Status: DC | PRN
  Administered 2018-11-27: 10 mg via ORAL
  Administered 2018-11-27: 5 mg via ORAL
  Filled 2018-11-27 (×2): qty 2

## 2018-11-27 MED ORDER — ASPIRIN EC 81 MG PO TBEC
81.0000 mg | DELAYED_RELEASE_TABLET | Freq: Every day | ORAL | Status: DC
  Administered 2018-11-28 – 2018-11-30 (×3): 81 mg via ORAL
  Filled 2018-11-27 (×3): qty 1

## 2018-11-27 MED ORDER — OXYCODONE HCL 5 MG PO TABS
10.0000 mg | ORAL_TABLET | ORAL | Status: DC | PRN
  Administered 2018-11-27: 15 mg via ORAL
  Administered 2018-11-28: 10 mg via ORAL
  Administered 2018-11-28 – 2018-11-30 (×6): 15 mg via ORAL
  Filled 2018-11-27 (×3): qty 3
  Filled 2018-11-27: qty 2
  Filled 2018-11-27 (×4): qty 3
  Filled 2018-11-27: qty 2
  Filled 2018-11-27: qty 3

## 2018-11-27 MED ORDER — AMLODIPINE BESYLATE 10 MG PO TABS
10.0000 mg | ORAL_TABLET | Freq: Every day | ORAL | Status: DC
  Administered 2018-11-27 – 2018-11-30 (×2): 10 mg via ORAL
  Filled 2018-11-27 (×2): qty 1

## 2018-11-27 MED ORDER — NEOMYCIN-POLYMYXIN B GU 40-200000 IR SOLN
Status: DC | PRN
  Administered 2018-11-27: 14 mL

## 2018-11-27 MED ORDER — OCUVITE-LUTEIN PO CAPS
1.0000 | ORAL_CAPSULE | Freq: Every day | ORAL | Status: DC
  Administered 2018-11-28 – 2018-11-30 (×3): 1 via ORAL
  Filled 2018-11-27 (×3): qty 1

## 2018-11-27 MED ORDER — ACETAMINOPHEN 500 MG PO TABS
1000.0000 mg | ORAL_TABLET | Freq: Four times a day (QID) | ORAL | Status: AC
  Administered 2018-11-27 (×2): 1000 mg via ORAL
  Filled 2018-11-27 (×2): qty 2

## 2018-11-27 MED ORDER — NEOMYCIN-POLYMYXIN B GU 40-200000 IR SOLN
Status: AC
  Filled 2018-11-27: qty 20

## 2018-11-27 MED ORDER — BUSPIRONE HCL 10 MG PO TABS
10.0000 mg | ORAL_TABLET | Freq: Two times a day (BID) | ORAL | Status: DC
  Administered 2018-11-27 – 2018-11-30 (×6): 10 mg via ORAL
  Filled 2018-11-27 (×6): qty 1

## 2018-11-27 MED ORDER — SODIUM CHLORIDE 0.9 % IV SOLN
INTRAVENOUS | Status: DC | PRN
  Administered 2018-11-27: 50 ug/min via INTRAVENOUS

## 2018-11-27 SURGICAL SUPPLY — 71 items
BLADE SAW 1 (BLADE) ×3 IMPLANT
BLADE SAW 1/2 (BLADE) ×3 IMPLANT
BNDG ELASTIC 6X5.8 VLCR STR LF (GAUZE/BANDAGES/DRESSINGS) ×3 IMPLANT
BONE CEMENT GENTAMICIN (Cement) ×6 IMPLANT
CANISTER SUCT 1200ML W/VALVE (MISCELLANEOUS) ×3 IMPLANT
CANISTER SUCT 3000ML PPV (MISCELLANEOUS) ×6 IMPLANT
CEMENT BONE GENTAMICIN 40 (Cement) IMPLANT
CHLORAPREP W/TINT 26 (MISCELLANEOUS) ×6 IMPLANT
COOLER POLAR GLACIER W/PUMP (MISCELLANEOUS) ×3 IMPLANT
COVER BACK TABLE REUSABLE LG (DRAPES) ×3 IMPLANT
COVER WAND RF STERILE (DRAPES) ×3 IMPLANT
CUFF TOURN SGL QUICK 24 (TOURNIQUET CUFF)
CUFF TOURN SGL QUICK 30 (TOURNIQUET CUFF)
CUFF TRNQT CYL 24X4X16.5-23 (TOURNIQUET CUFF) IMPLANT
CUFF TRNQT CYL 30X4X21-28X (TOURNIQUET CUFF) IMPLANT
DRAPE 3/4 80X56 (DRAPES) ×12 IMPLANT
ELECT CAUTERY BLADE 6.4 (BLADE) ×3 IMPLANT
ELECT REM PT RETURN 9FT ADLT (ELECTROSURGICAL) ×3
ELECTRODE REM PT RTRN 9FT ADLT (ELECTROSURGICAL) ×1 IMPLANT
FEM COMP 5 LT CEMENT 02120005L (Femur) ×2 IMPLANT
GAUZE SPONGE 4X4 12PLY STRL (GAUZE/BANDAGES/DRESSINGS) ×3 IMPLANT
GAUZE XEROFORM 1X8 LF (GAUZE/BANDAGES/DRESSINGS) ×3 IMPLANT
GLOVE BIOGEL PI IND STRL 9 (GLOVE) ×1 IMPLANT
GLOVE BIOGEL PI INDICATOR 9 (GLOVE) ×2
GLOVE INDICATOR 8.0 STRL GRN (GLOVE) ×3 IMPLANT
GLOVE SURG ORTHO 8.0 STRL STRW (GLOVE) ×3 IMPLANT
GLOVE SURG SYN 9.0  PF PI (GLOVE) ×2
GLOVE SURG SYN 9.0 PF PI (GLOVE) ×1 IMPLANT
GOWN SRG 2XL LVL 4 RGLN SLV (GOWNS) ×1 IMPLANT
GOWN STRL NON-REIN 2XL LVL4 (GOWNS) ×2
GOWN STRL REUS W/ TWL LRG LVL3 (GOWN DISPOSABLE) ×1 IMPLANT
GOWN STRL REUS W/ TWL XL LVL3 (GOWN DISPOSABLE) ×1 IMPLANT
GOWN STRL REUS W/TWL LRG LVL3 (GOWN DISPOSABLE) ×2
GOWN STRL REUS W/TWL XL LVL3 (GOWN DISPOSABLE) ×2
HOLDER FOLEY CATH W/STRAP (MISCELLANEOUS) ×3 IMPLANT
HOOD PEEL AWAY FLYTE STAYCOOL (MISCELLANEOUS) ×6 IMPLANT
INSERT TIBIAL SZ4 LEFT 20 FLX (Insert) ×2 IMPLANT
KIT PREVENA INCISION MGT 13 (CANNISTER) ×2 IMPLANT
KIT STIMULAN RAPID CURE 5CC (Orthopedic Implant) ×4 IMPLANT
KIT TURNOVER KIT A (KITS) ×3 IMPLANT
KNIFE SCULPS 14X20 (INSTRUMENTS) ×3 IMPLANT
NDL SPNL 18GX3.5 QUINCKE PK (NEEDLE) ×1 IMPLANT
NDL SPNL 20GX3.5 QUINCKE YW (NEEDLE) ×1 IMPLANT
NEEDLE SPNL 18GX3.5 QUINCKE PK (NEEDLE) ×3 IMPLANT
NEEDLE SPNL 20GX3.5 QUINCKE YW (NEEDLE) ×3 IMPLANT
NS IRRIG 1000ML POUR BTL (IV SOLUTION) ×3 IMPLANT
PACK TOTAL KNEE (MISCELLANEOUS) ×3 IMPLANT
PAD WRAPON POLAR KNEE (MISCELLANEOUS) ×1 IMPLANT
PATELLA RESURFACING MEDACTA 02 (Bone Implant) ×2 IMPLANT
PULSAVAC PLUS IRRIG FAN TIP (DISPOSABLE) ×3
SCALPEL PROTECTED #10 DISP (BLADE) ×6 IMPLANT
SOL .9 NS 3000ML IRR  AL (IV SOLUTION) ×2
SOL .9 NS 3000ML IRR UROMATIC (IV SOLUTION) ×1 IMPLANT
STAPLER SKIN PROX 35W (STAPLE) ×3 IMPLANT
STEM EXTENSION 11MMX30MM (Stem) ×2 IMPLANT
SUCTION FRAZIER HANDLE 10FR (MISCELLANEOUS) ×2
SUCTION TUBE FRAZIER 10FR DISP (MISCELLANEOUS) ×1 IMPLANT
SUT DVC 2 QUILL PDO  T11 36X36 (SUTURE) ×2
SUT DVC 2 QUILL PDO T11 36X36 (SUTURE) ×1 IMPLANT
SUT TICRON 2-0 30IN 311381 (SUTURE) IMPLANT
SUT V-LOC 90 ABS DVC 3-0 CL (SUTURE) ×3 IMPLANT
SWAB CULTURE AMIES ANAERIB BLU (MISCELLANEOUS) IMPLANT
SYR 20ML LL LF (SYRINGE) ×3 IMPLANT
SYR 50ML LL SCALE MARK (SYRINGE) ×6 IMPLANT
TIBIAL AUGMENT CABLE SZ4 5 (Cable) ×4 IMPLANT
TIP FAN IRRIG PULSAVAC PLUS (DISPOSABLE) ×1 IMPLANT
TOWEL OR 17X26 4PK STRL BLUE (TOWEL DISPOSABLE) ×3 IMPLANT
TOWER CARTRIDGE SMART MIX (DISPOSABLE) ×3 IMPLANT
TRAY FOLEY MTR SLVR 16FR STAT (SET/KITS/TRAYS/PACK) ×3 IMPLANT
TRAY TIBIAL REV L S4 (Knees) ×2 IMPLANT
WRAPON POLAR PAD KNEE (MISCELLANEOUS) ×3

## 2018-11-27 NOTE — TOC Progression Note (Signed)
Transition of Care [facility]) - Progression Note    Patient Details  Name: DJANGO NGUYEN MRN: [id number] Date of Birth: [birth year]  Transition of Care [facility]) CM/SW [place], RN Phone Number: 11/27/2018, 10:22 AM  Clinical Narrative:    Requested the price of lovenox, will notify the patient once obtained        Expected Discharge Plan and Services                                                 Social Determinants of Health (SDOH) Interventions    Readmission Risk Interventions No flowsheet data found.

## 2018-11-27 NOTE — TOC Benefit Eligibility Note (Signed)
Transition of Care [facility]) Benefit Eligibility Note    Patient Details  Name: Nathan Chambers MRN: [id number] Date of Birth: 1965-10-03   Medication/Dose: Enoxaparin 40mg  once daily for 14 days  Covered?: Yes  Tier: (Tier 1)  Prescription Coverage Preferred Pharmacy: Tanda Rockers with Person/Company/Phone Number:: Josh with [employer] [employer] at [phone]  Co-Pay: $10.00 estimated copay  Prior Approval: No  Deductible: (No deductible on plan per rep.)    Dannette Barbara Phone Number: [phone] or [phone] 11/27/2018, 1:15 PM

## 2018-11-27 NOTE — Progress Notes (Signed)
PT Cancellation Note  Patient Details Name: Nathan Chambers MRN: [id number] DOB: 12-02-1965   Cancelled Treatment:    Reason Eval/Treat Not Completed: Patient declined, no reason specified Pt laying in bed doing heel slides and c/o considerable pain.  Offered to come back in ~1 hr and he responds "Only if you want to talk, I just can't do it today."  Pt has been through L knee surgeries/revisions multiple times and has typically been able to find his stride on his own terms.  He feels that he will have a rough day tomorrow as well but has traditionally made great gains on POD2-3.  POD0 PT session deferred per pt request.  Kreg Shropshire, DPT 11/27/2018, 4:06 PM

## 2018-11-27 NOTE — Transfer of Care (Signed)
Immediate Anesthesia Transfer of Care Note  Patient: Nathan Chambers  Procedure(s) Performed: TOTAL KNEE REVISION REIMPLANTATION OF LEFT TOTAL KNEE (Left Knee)  Patient Location: PACU  Anesthesia Type:Spinal  Level of Consciousness: awake and sedated  Airway & Oxygen Therapy: Patient Spontanous Breathing and Patient connected to face mask oxygen  Post-op Assessment: Report given to RN and Post -op Vital signs reviewed and stable  Post vital signs: Reviewed and stable  Last Vitals:  Vitals Value Taken Time  BP    Temp    Pulse 89 11/27/18 1031  Resp 16 11/27/18 1031  SpO2 100 % 11/27/18 1031  Vitals shown include unvalidated device data.  Last Pain:  Vitals:   11/27/18 0612  TempSrc: Tympanic  PainSc: 4          Complications: No apparent anesthesia complications

## 2018-11-27 NOTE — Consult Note (Signed)
NAME: Nathan Chambers  DOB: 1966/02/23  MRN: [id number]  Date/Time: 11/27/2018 10:46 PM  REQUESTING PROVIDER Dr. Rudene Christians Subjective:  REASON FOR CONSULT: Postop antibiotic recommendation ? Nathan Chambers is a 53 y.o. 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     Patient has a complicated infectious history of the left knee.  Patient had bilateral knee replacement on 04/19/2016 by Dr. Rudene Christians and was doing okay until August 2019.  He was in [facility] between August 9 until August 13 for left knee pain and swelling following an injury sustained by been more to the left calf.  He underwent left knee arthrotomy and irrigation debridement and polyethylene exchange on 12/08/2017.  The culture from the surgery was staph aureus.  I had seen him and I treated him with 6 weeks of IV cefazolin and p.o. rifampin.  After the completion of IV he was on oral Bactrim and rifampin.  On 06/08/2018 he developed sudden onset left knee pain after missing 2 days of antibiotics.  He was admitted to the hospital and was taken for surgery and underwent Ii/D of the left knee with poly-exchange and tissue culture was negative.  He was sent home on Bactrim and rifampin.On August 29, 2018 he presented to Dr. Byrd Hesselbach office with sudden onset pain and swelling and he was given a 10-day steroid taper and asked to ice and elevate his knee.  But he did not improve and on 09/03/2018 had aspiration of the left knee and that culture came back as staph aureus and hence he got admitted to the hospital and on 09/11/2018 underwent removal of the total knee arthroplasty and had an antibiotic spacer and then took 6 weeks of IV nafcillin which he completed on 10/23/2018.  The next day he  underwent aspiration of the left knee by Dr. Rudene Christians and the culture was negative.  On 11/19/2018 his ESR was 10 and WBC was 8.7.. He has been doing well with no fever or swelling of the knee. Preop screening for MRSA nares swab was negative.  So was staph aureus.  Urine culture was no growth. Past Medical History:  Diagnosis Date   Anxiety    Arthritis    knees, hands   Bipolar disorder ([employer])    Chronic kidney disease    arf 2014 due to dehydration   Complication of anesthesia    woke up during last knee surgery feb 2020   GERD (gastroesophageal reflux disease)    rare no meds   Headache    Hemorrhoids    Hyperlipidemia    Hypertension    LFT elevation    NAFLD (nonalcoholic fatty liver disease)     Past Surgical History:  Procedure Laterality Date   ACHILLES TENDON SURGERY Right 10/30/2015   Procedure: ACHILLES TENDON REPAIR/ HEGLAND OSTECTOMY;  Surgeon: [name], DPM;  Location: [facility] ORS;  Service: Podiatry;  Laterality: Right;   COLONOSCOPY     COLONOSCOPY WITH PROPOFOL N/A 04/07/2017   Procedure: COLONOSCOPY WITH PROPOFOL;  Surgeon: Toledo, Benay Pike, MD;  Location: [facility];  Service: Gastroenterology;  Laterality: N/A;   FRACTURE SURGERY Left    MIDDLE FINGER   HERNIA REPAIR     I&D KNEE WITH POLY EXCHANGE Left 12/08/2017  Procedure: IRRIGATION AND DEBRIDEMENT KNEE WITH POLY EXCHANGE;  Surgeon: Dereck Leep, MD;  Location: [facility] ORS;  Service: Orthopedics;  Laterality: Left;   JOINT REPLACEMENT Bilateral 2017   bilateral knees   KNEE ARTHROTOMY Left 12/08/2017   Procedure: KNEE ARTHROTOMY, I&D, Geraldine OUT, POLY EXCHANGE;  Surgeon: Dereck Leep, MD;  Location: [facility] ORS;  Service: Orthopedics;  Laterality: Left;   LIPOMA EXCISION     TOTAL KNEE ARTHROPLASTY Bilateral 04/19/2016   Procedure: TOTAL KNEE BILATERAL;  Surgeon: Hessie Knows, MD;  Location: [facility] ORS;  Service: Orthopedics;  Laterality: Bilateral;   TOTAL KNEE ARTHROPLASTY Left  09/11/2018   Procedure: TOTAL KNEE ARTHROPLASTY - LEFT - HARDWARE REMOVAL WITH INSERTION OF ANTIBIOTIC SPACER;  Surgeon: Hessie Knows, MD;  Location: [facility] OR[facility];  Service: Orthopedics;  Laterality: Left;   TOTAL KNEE REVISION Left 06/11/2018   Procedure: INCISION AND DRAINAGE OF LEFT KNEE, POLY EXCHANGE;  Surgeon: Hessie Knows, MD;  Location: [facility] ORS;  Service: Orthopedics;  Laterality: Left;   VASECTOMY      Social History   Socioeconomic History   Marital status: Married    Spouse name: Not on file   Number of children: Not on file   Years of education: Not on file   Highest education level: Not on file  Occupational History   Not on file  Social Needs   Financial resource strain: Somewhat hard   Food insecurity    Worry: Never true    Inability: Never true   Transportation needs    Medical: Patient refused    Non-medical: Patient refused  Tobacco Use   Smoking status: Never Smoker   Smokeless tobacco: Former Systems developer    Types: Chew  Substance and Sexual Activity   Alcohol use: No   Drug use: No   Sexual activity: Yes  Lifestyle   Physical activity    Days per week: 1 day    Minutes per session: 30 min   Stress: To some extent  Relationships   Social connections    Talks on phone: More than three times a week    Gets together: More than three times a week    Attends religious service: Never    Active member of club or organization: No    Attends meetings of clubs or organizations: Never    Relationship status: Married   Intimate partner violence    Fear of current or ex partner: Patient refused    Emotionally abused: Patient refused    Physically abused: Patient refused    Forced sexual activity: Patient refused  Other Topics Concern   Not on file  Social History Narrative   Not on file    History reviewed. No pertinent family history. Allergies  Allergen Reactions   Etodolac Itching   ? Current Facility-Administered Medications    Medication Dose Route Frequency Provider Last Rate Last Dose   0.9 %  sodium chloride infusion   Intravenous Continuous Hessie Knows, MD       acetaminophen (TYLENOL) tablet 1,000 mg  1,000 mg Oral Q6H Hessie Knows, MD   1,000 mg at 11/27/18 1655   [START ON 11/28/2018] acetaminophen (TYLENOL) tablet 325-650 mg  325-650 mg Oral Q6H PRN Hessie Knows, MD       alum & mag hydroxide-simeth (MAALOX/MYLANTA) 200-200-20 MG/5ML suspension 30 mL  30 mL Oral Q4H PRN Hessie Knows, MD       amitriptyline (ELAVIL) tablet 50 mg  50 mg Oral QHS Hessie Knows, MD  50 mg at 11/27/18 2100   amLODipine (NORVASC) tablet 10 mg  10 mg Oral Daily Hessie Knows, MD   10 mg at 11/27/18 1447   And   benazepril (LOTENSIN) tablet 40 mg  40 mg Oral Daily Hessie Knows, MD   40 mg at 11/27/18 1447   [START ON 11/28/2018] aspirin EC tablet 81 mg  81 mg Oral Daily Hessie Knows, MD       bisacodyl (DULCOLAX) EC tablet 5 mg  5 mg Oral Daily PRN Hessie Knows, MD       busPIRone (BUSPAR) tablet 10 mg  10 mg Oral BID Hessie Knows, MD   10 mg at 11/27/18 2100   ceFAZolin (ANCEF) 3 g in dextrose 5 % 50 mL IVPB  3 g Intravenous Delfina Redwood, MD   Stopped at 11/27/18 1632   [START ON 11/28/2018] chlorthalidone (HYGROTON) tablet 12.5 mg  12.5 mg Oral Daily Hessie Knows, MD       diphenhydrAMINE (BENADRYL) 12.5 MG/5ML elixir 12.5-25 mg  12.5-25 mg Oral Q4H PRN Hessie Knows, MD       docusate sodium (COLACE) capsule 100 mg  100 mg Oral BID Hessie Knows, MD   100 mg at 11/27/18 2100   doxepin (SINEQUAN) capsule 20 mg  20 mg Oral QHS Hessie Knows, MD   20 mg at 11/27/18 2100   [START ON 11/28/2018] DULoxetine (CYMBALTA) DR capsule 60 mg  60 mg Oral Claudine Mouton, Legrand Como, MD       Derrill Memo ON 11/28/2018] enoxaparin (LOVENOX) injection 30 mg  30 mg Subcutaneous Q12H Hessie Knows, MD       Derrill Memo ON 08/13/2438] folic acid (FOLVITE) tablet 1 mg  1 mg Oral Daily Hessie Knows, MD       gemfibrozil (LOPID) tablet 600  mg  600 mg Oral BID AC Hessie Knows, MD   600 mg at 11/27/18 1656   HYDROmorphone (DILAUDID) injection 0.5-1 mg  0.5-1 mg Intravenous Q4H PRN Hessie Knows, MD   0.5 mg at 11/27/18 1744   magnesium citrate solution 1 Bottle  1 Bottle Oral Once PRN Hessie Knows, MD       magnesium hydroxide (MILK OF MAGNESIA) suspension 30 mL  30 mL Oral Daily PRN Hessie Knows, MD       menthol-cetylpyridinium (CEPACOL) lozenge 3 mg  1 lozenge Oral PRN Hessie Knows, MD       Or   phenol (CHLORASEPTIC) mouth spray 1 spray  1 spray Mouth/Throat PRN Hessie Knows, MD       methocarbamol (ROBAXIN) tablet 500 mg  500 mg Oral Q6H PRN Hessie Knows, MD   500 mg at 11/27/18 1654   Or   methocarbamol (ROBAXIN) 500 mg in dextrose 5 % 50 mL IVPB  500 mg Intravenous Q6H PRN Hessie Knows, MD       metoCLOPramide (REGLAN) tablet 5-10 mg  5-10 mg Oral Q8H PRN Hessie Knows, MD       Or   metoCLOPramide (REGLAN) injection 5-10 mg  5-10 mg Intravenous Q8H PRN Hessie Knows, MD       Derrill Memo ON 11/28/2018] multivitamin with minerals tablet 1 tablet  1 tablet Oral Daily Hessie Knows, MD       Derrill Memo ON 11/28/2018] multivitamin-lutein (OCUVITE-LUTEIN) capsule 1 capsule  1 capsule Oral Daily Hessie Knows, MD       ondansetron [facility]) tablet 4 mg  4 mg Oral Q6H PRN Hessie Knows, MD       Or   ondansetron [facility])  injection 4 mg  4 mg Intravenous Q6H PRN Hessie Knows, MD       oxyCODONE (Oxy IR/ROXICODONE) immediate release tablet 10-15 mg  10-15 mg Oral Q4H PRN Hessie Knows, MD   15 mg at 11/27/18 2056   oxyCODONE (Oxy IR/ROXICODONE) immediate release tablet 5-10 mg  5-10 mg Oral Q4H PRN Hessie Knows, MD   10 mg at 11/27/18 1655   oxyCODONE (OXYCONTIN) 12 hr tablet 15 mg  15 mg Oral Q12H Hessie Knows, MD   15 mg at 11/27/18 2159   [START ON 11/28/2018] PARoxetine (PAXIL) tablet 40 mg  40 mg Oral Daily Hessie Knows, MD       pravastatin (PRAVACHOL) tablet 20 mg  20 mg Oral QPM Hessie Knows, MD   20 mg at  11/27/18 1655   traMADol (ULTRAM) tablet 50 mg  50 mg Oral Q6H Hessie Knows, MD   50 mg at 11/27/18 1447   [START ON 11/28/2018] vitamin B-12 (CYANOCOBALAMIN) tablet 250 mcg  250 mcg Oral Daily Hessie Knows, MD       Derrill Memo ON 11/28/2018] vitamin C (ASCORBIC ACID) tablet 500 mg  500 mg Oral Daily Hessie Knows, MD       zolpidem (AMBIEN) tablet 5 mg  5 mg Oral QHS PRN,MR X 1 Hessie Knows, MD         Abtx:  Anti-infectives (From admission, onward)   Start     Dose/Rate Route Frequency Ordered Stop   11/27/18 1600  ceFAZolin (ANCEF) 3 g in dextrose 5 % 50 mL IVPB     3 g 100 mL/hr over 30 Minutes Intravenous Every 8 hours 11/27/18 1356 11/30/18 1559   11/27/18 1400  ceFAZolin (ANCEF) IVPB 2g/100 mL premix  Status:  Discontinued     2 g 200 mL/hr over 30 Minutes Intravenous Every 8 hours 11/27/18 1327 11/27/18 1448   11/26/18 2215  ceFAZolin (ANCEF) 3 g in dextrose 5 % 50 mL IVPB     3 g 100 mL/hr over 30 Minutes Intravenous  Once 11/26/18 2210 11/27/18 0815      REVIEW OF SYSTEMS:  Const: negative fever, negative chills, negative weight loss Eyes: negative diplopia or visual changes, negative eye pain ENT: negative coryza, negative sore throat Resp: negative cough, hemoptysis, dyspnea Cards: negative for chest pain, palpitations, lower extremity edema GU: negative for frequency, dysuria and hematuria GI: Negative for abdominal pain, diarrhea, bleeding, constipation Skin: negative for rash and pruritus Heme: negative for easy bruising and gum/nose bleeding MS: Pain left knee Neurolo:negative for headaches, dizziness, vertigo, memory problems  Psych: negative for feelings of anxiety, depression  Endocrine: negative for thyroid, diabetes Allergy/Immunology-as above: Objective:  VITALS:  BP 111/70 (BP Location: Right Arm)    Pulse (!) 109    Temp 98.3 F (36.8 C)    Resp 18    Ht '6\' 3"'$  (1.905 m)    Wt (!) 148 kg    SpO2 94%    BMI 40.78 kg/m  PHYSICAL EXAM:  General: Alert,  cooperative, no distress, appears stated age.  Head: Normocephalic, without obvious abnormality, atraumatic. Eyes: Conjunctivae clear, anicteric sclerae. Pupils are equal ENT Nares normal. No drainage or sinus tenderness. Lips, mucosa, and tongue normal. No Thrush Neck: Supple, symmetrical, no adenopathy, thyroid: non tender no carotid bruit and no JVD. Back: No CVA tenderness. Lungs: Clear to auscultation bilaterally. No Wheezing or Rhonchi. No rales. Heart: Regular rate and rhythm, no murmur, rub or gallop. Abdomen: Soft, non-tender,not distended. Bowel sounds normal. No  masses Extremities: Left knee surgical dressing drain present Skin: No rashes or lesions. Or bruising Lymph: Cervical, supraclavicular normal. Neurologic: Grossly non-focal Pertinent Labs Lab Results CBC    Component Value Date/Time   WBC 15.0 (H) 11/27/2018 1437   RBC 4.51 11/27/2018 1437   HGB 13.2 11/27/2018 1437   HGB 13.9 01/06/2013 0424   HCT 40.4 11/27/2018 1437   HCT 39.4 (L) 01/06/2013 0424   PLT 278 11/27/2018 1437   PLT 254 01/06/2013 0424   MCV 89.6 11/27/2018 1437   MCV 91 01/06/2013 0424   MCH 29.3 11/27/2018 1437   MCHC 32.7 11/27/2018 1437   RDW 13.3 11/27/2018 1437   RDW 13.4 01/06/2013 0424   LYMPHSABS 1.7 08/09/2018 1139   LYMPHSABS 2.1 01/06/2013 0424   MONOABS 0.7 08/09/2018 1139   MONOABS 0.7 01/06/2013 0424   EOSABS 0.5 08/09/2018 1139   EOSABS 0.3 01/06/2013 0424   BASOSABS 0.1 08/09/2018 1139   BASOSABS 0.1 01/06/2013 0424    CMP Latest Ref Rng & Units 11/27/2018 11/19/2018 09/14/2018  Glucose 70 - 99 mg/dL - 95 123(H)  BUN 6 - 20 mg/dL - 9 9  Creatinine 0.61 - 1.24 mg/dL 0.90 0.87 0.78  Sodium 135 - 145 mmol/L - 138 138  Potassium 3.5 - 5.1 mmol/L - 3.3(L) 3.6  Chloride 98 - 111 mmol/L - 100 99  CO2 22 - 32 mmol/L - 27 30  Calcium 8.9 - 10.3 mg/dL - 9.4 8.8(L)  Total Protein 6.5 - 8.1 g/dL - - -  Total Bilirubin 0.3 - 1.2 mg/dL - - -  Alkaline Phos 38 - 126 U/L - - -    AST 15 - 41 U/L - - -  ALT 0 - 44 U/L - - -      Microbiology: Recent Results (from the past 240 hour(s))  Urine culture     Status: None   Collection Time: 11/19/18  2:45 PM   Specimen: Urine, Random  Result Value Ref Range Status   Specimen Description   Final    URINE, RANDOM Performed at [facility], [address] [place], [place] [address]    Special Requests   Final    NONE Performed at [facility], [address] [place], [place] [address]    Culture   Final    NO GROWTH Performed at [facility], [place] [address] [place], [place] [address]    Report Status 11/20/2018 FINAL  Final  Surgical pcr screen     Status: None   Collection Time: 11/19/18  2:45 PM   Specimen: Nasal Mucosa; Nasal Swab  Result Value Ref Range Status   MRSA, PCR NEGATIVE NEGATIVE Final   Staphylococcus aureus NEGATIVE NEGATIVE Final    Comment: (NOTE) The Xpert SA Assay (FDA approved for NASAL specimens in patients 39 years of age and older), is one component of a comprehensive surveillance program. It is not intended to diagnose infection nor to guide or monitor treatment. Performed at [facility], [address] [place], [place] [address]   SARS Coronavirus 2 (Performed in [facility] hospital lab)     Status: None   Collection Time: 11/23/18  8:36 AM   Specimen: Nasal Swab  Result Value Ref Range Status   SARS Coronavirus 2 NEGATIVE NEGATIVE Final    Comment: (NOTE) SARS-CoV-2 target nucleic acids are NOT DETECTED. The SARS-CoV-2 RNA is generally detectable in upper and lower respiratory specimens during the acute phase of infection. Negative results  do not preclude SARS-CoV-2 infection, do not rule out co-infections with other pathogens, and should not be used as the sole basis for treatment or other patient management decisions. Negative results must be combined with clinical observations, patient history, and epidemiological  information. The expected result is Negative. Fact Sheet for Patients: SugarRoll.be Fact Sheet for Healthcare Providers: https://www.woods-mathews.com/ This test is not yet approved or cleared by the [place] FDA and  has been authorized for detection and/or diagnosis of SARS-CoV-2 by FDA under an Emergency Use Authorization (EUA). This EUA will remain  in effect (meaning this test can be used) for the duration of the COVID-19 declaration under Section 56 4(b)(1) of the Act, 21 U.S.C. section 360bbb-3(b)(1), unless the authorization is terminated or revoked sooner. Performed at [facility], [place] [address] [place], [place] [address]   Aerobic/Anaerobic Culture (surgical/deep wound)     Status: None (Preliminary result)   Collection Time: 11/27/18  8:02 AM   Specimen: [facility] Cytology Misc. fluid; Body Fluid  Result Value Ref Range Status   Specimen Description   Final    KNEE LEFT SYNOVIAL Performed at [facility], [address] [place], [place] [address]    Special Requests   Final    NONE Performed at [facility], [address] [place], [place] [address]    Gram Stain   Final    RARE WBC PRESENT, PREDOMINANTLY MONONUCLEAR NO ORGANISMS SEEN Performed at [facility], [place] [address], [place], [place] [address]    Culture PENDING  Incomplete   Report Status PENDING  Incomplete    IMAGING RESULTS:  I have personally reviewed the films ? Impression/Recommendation ? 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 Explantation of the prosthetic joint in May 2020 followed by 6 weeks of IV nafcillin.  Post antibiotic culture of the left knee joint was negative but it was done very soon after completion of antibiotic . ESR done a month later was 10 indicating no inflammation. He underwent total knee revision and reimplantation today.  Cultures have been sent from the  OR of the synovial tissue.  We will give him cefazolin plus rifampin until we get the cultures back.  Hypertension on amlodipine and benazepril and chlorthalidone  Hyperlipidemia on Pravachol and gemfibrozil  Anxiety depression on BuSpar, doxepin, duloxetine, paroxetine and as needed Ambien and amitriptyline at nighttime. ___________________________________________________ Discussed with patient in detail Note:  This document was prepared using Dragon voice recognition software and may include unintentional dictation errors.

## 2018-11-27 NOTE — Anesthesia Post-op Follow-up Note (Signed)
Anesthesia QCDR form completed.        

## 2018-11-27 NOTE — Anesthesia Procedure Notes (Addendum)
Spinal  Patient location during procedure: OR Start time: 11/27/2018 7:32 AM End time: 11/27/2018 7:35 AM Staffing Anesthesiologist: Emmie Niemann, MD Resident/CRNA: Nelda Marseille, CRNA Performed: resident/CRNA  Preanesthetic Checklist Completed: patient identified, site marked, surgical consent, pre-op evaluation, timeout performed, IV checked, risks and benefits discussed and monitors and equipment checked Spinal Block Patient position: sitting Prep: ChloraPrep Patient monitoring: heart rate, continuous pulse ox and blood pressure Approach: midline Location: L4-5 Injection technique: single-shot Needle Needle type: Introducer and Pencil-Tip  Needle gauge: 24 G Needle length: 9 cm Additional Notes Negative paresthesia. Negative blood return. Positive free-flowing CSF. Expiration date of kit checked and confirmed. Patient tolerated procedure well, without complications.

## 2018-11-27 NOTE — Anesthesia Preprocedure Evaluation (Signed)
Anesthesia Evaluation  Patient identified by MRN, date of birth, ID band Patient awake    Reviewed: Allergy & Precautions, NPO status , Patient's Chart, lab work & pertinent test results  History of Anesthesia Complications Negative for: history of anesthetic complications  Airway Mallampati: III  TM Distance: >3 FB Neck ROM: Full    Dental no notable dental hx.    Pulmonary neg pulmonary ROS, neg sleep apnea, neg COPD,    breath sounds clear to auscultation- rhonchi (-) wheezing      Cardiovascular hypertension, Pt. on medications (-) CAD, (-) Past MI, (-) Cardiac Stents and (-) CABG  Rhythm:Regular Rate:Normal - Systolic murmurs and - Diastolic murmurs    Neuro/Psych  Headaches, neg Seizures PSYCHIATRIC DISORDERS Anxiety Bipolar Disorder    GI/Hepatic Neg liver ROS, GERD  ,  Endo/Other  negative endocrine ROSneg diabetes  Renal/GU negative Renal ROS     Musculoskeletal  (+) Arthritis ,   Abdominal (+) + obese,   Peds  Hematology negative hematology ROS (+)   Anesthesia Other Findings Past Medical History: No date: Anxiety No date: Arthritis     Comment:  knees, hands No date: Bipolar disorder (HCC) No date: Chronic kidney disease     Comment:  arf 2014 due to dehydration No date: Complication of anesthesia     Comment:  woke up during last knee surgery feb 2020 No date: GERD (gastroesophageal reflux disease)     Comment:  rare no meds No date: Headache No date: Hemorrhoids No date: Hyperlipidemia No date: Hypertension No date: LFT elevation No date: NAFLD (nonalcoholic fatty liver disease)   Reproductive/Obstetrics                             Lab Results  Component Value Date   WBC 8.7 11/19/2018   HGB 14.1 11/19/2018   HCT 42.0 11/19/2018   MCV 87.5 11/19/2018   PLT 295 11/19/2018    Anesthesia Physical Anesthesia Plan  ASA: III  Anesthesia Plan: Spinal   Post-op  Pain Management:    Induction:   PONV Risk Score and Plan: 1 and Ondansetron and Propofol infusion  Airway Management Planned: Natural Airway  Additional Equipment:   Intra-op Plan:   Post-operative Plan:   Informed Consent: I have reviewed the patients History and Physical, chart, labs and discussed the procedure including the risks, benefits and alternatives for the proposed anesthesia with the patient or authorized representative who has indicated his/her understanding and acceptance.     Dental advisory given  Plan Discussed with: CRNA and Anesthesiologist  Anesthesia Plan Comments:         Anesthesia Quick Evaluation

## 2018-11-27 NOTE — H&P (Signed)
Reviewed paper H+P, will be scanned into chart. No changes noted. Prior aspiration had no growth, ESR is 10.

## 2018-11-27 NOTE — Op Note (Signed)
11/27/2018  10:31 AM  PATIENT:  Nathan Chambers  53 y.o. male  PRE-OPERATIVE DIAGNOSIS:  STATUS POST HARDWARE REMOVAL prior infected total knee with antibiotic spacer placed  POST-OPERATIVE DIAGNOSIS:  STATUS POST HARDWARE REMOVAL same  PROCEDURE:  Procedure(s): TOTAL KNEE REVISION REIMPLANTATION OF LEFT TOTAL KNEE (Left)  SURGEON: Laurene Footman, MD  ASSISTANTS: Rachelle Hora, PA-C  ANESTHESIA:   spinal  EBL:  Total I/O In: 3000 [I.V.:3000] Out: 800 [Urine:300; Blood:500]  BLOOD ADMINISTERED:none  DRAINS: none   LOCAL MEDICATIONS USED:  OTHER Exparel  SPECIMEN:  Source of Specimen:  Culture of synovial fluid  DISPOSITION OF SPECIMEN:  Microbiology  COUNTS:  YES  TOURNIQUET:   Total Tourniquet Time Documented: Thigh (Left) - 97 minutes Total: Thigh (Left) - 97 minutes   IMPLANTS: Medacta Jim K sphere system, 5 left femur, 4 tibia with 5 mm augments medial and lateral and short stem, 2 patella, all components cemented with 20 mm insert   DICTATION: .Dragon Dictation patient was brought to the operating room and after adequate spinal anesthesia was obtained the left leg was prepped and draped in the usual sterile fashion.  After patient identification and timeout procedures were completed, midline incision was made using the prior incision.  Medial parapatellar arthrotomy was then performed and synovial fluid was cultured.  There is a bloody fluid but nothing that appeared infected.  For the first part of procedure involved mobilizing the gutters medial and lateral to allow for knee flexion and extension and debriding some mild synovitis which was present.  When this was adequate adequately done in the patellar tendon adequately mobilized the knee was brought up into flexion with an osteotome the prior femoral cement spacer was split into and then broken off in pieces without removing any bone.  The tibial component spacer was then removed in a similar fashion again with really  no bone loss.  Next the deep scar tissue was excised around the femur and tibia with thorough debridement carried out.  An extra medullary tibial alignment rod was utilized to perform a freshen up cut in the proximal tibia with additional 2 mm removed.  The tibia sized to a size 4 and proximal tibial preparation carried out.  Next the femur was had a approximately millimeter removed from the distal end and then a 5 block applied anterior posterior and chamfer cuts carried out.  Again the trials were placed and drill holes made for the distal femur followed by the notch cut for the trochlear groove.  A 17 mm insert gave good stability but was somewhat lax in flexion.  The patella was then exposed and adequately debrided with subsequent lateral release carried out with the patella sized to a size 2.  After drill holes were made.  At this point the knee was irrigated with 3 L of dilute Betadine solution via pulsatile lavage and then 3 L of antibiotic solution.  When the bony surfaces were thoroughly dried and after Exparel had been injected around the joint the tibial component was cemented into place first followed by the femoral component and a 17 mm trial with excess cement removed the patellar button clamped into place as well with all components cemented.  After the cement had set and excess cement was removed there was some tendency to subluxate anteriorly in flexion so 20 mm spacer was trialed and this was chosen as a final implant.  The tourniquet was let down at this point and stimulant beads with vancomycin were  placed in the gutters the arthrotomy was repaired using #2 Ethibond followed along with a heavy Quill suture with 30V lock subcuticular skin staples and Provena incisional wound VAC  PLAN OF CARE: Admit to inpatient   PATIENT DISPOSITION:  PACU - hemodynamically stable.

## 2018-11-28 ENCOUNTER — Encounter: Payer: Self-pay | Admitting: Orthopedic Surgery

## 2018-11-28 LAB — CBC
HCT: 38.7 % — ABNORMAL LOW (ref 39.0–52.0)
Hemoglobin: 12.5 g/dL — ABNORMAL LOW (ref 13.0–17.0)
MCH: 29.5 pg (ref 26.0–34.0)
MCHC: 32.3 g/dL (ref 30.0–36.0)
MCV: 91.3 fL (ref 80.0–100.0)
Platelets: 255 10*3/uL (ref 150–400)
RBC: 4.24 MIL/uL (ref 4.22–5.81)
RDW: 13.6 % (ref 11.5–15.5)
WBC: 12.4 10*3/uL — ABNORMAL HIGH (ref 4.0–10.5)
nRBC: 0 % (ref 0.0–0.2)

## 2018-11-28 LAB — BASIC METABOLIC PANEL
Anion gap: 12 (ref 5–15)
BUN: 11 mg/dL (ref 6–20)
CO2: 25 mmol/L (ref 22–32)
Calcium: 8.7 mg/dL — ABNORMAL LOW (ref 8.9–10.3)
Chloride: 99 mmol/L (ref 98–111)
Creatinine, Ser: 1.45 mg/dL — ABNORMAL HIGH (ref 0.61–1.24)
GFR calc Af Amer: >60 mL/min (ref >60)
GFR calc non Af Amer: 54 mL/min — ABNORMAL LOW (ref >60)
Glucose, Bld: 125 mg/dL — ABNORMAL HIGH (ref 70–99)
Potassium: 3.9 mmol/L (ref 3.5–5.1)
Sodium: 136 mmol/L (ref 135–145)

## 2018-11-28 MED ORDER — FAMOTIDINE 20 MG PO TABS
20.0000 mg | ORAL_TABLET | Freq: Two times a day (BID) | ORAL | Status: DC
  Administered 2018-11-28 – 2018-11-30 (×5): 20 mg via ORAL
  Filled 2018-11-28 (×5): qty 1

## 2018-11-28 MED ORDER — RIFAMPIN 300 MG PO CAPS
300.0000 mg | ORAL_CAPSULE | Freq: Two times a day (BID) | ORAL | Status: DC
  Administered 2018-11-29 – 2018-11-30 (×3): 300 mg via ORAL
  Filled 2018-11-28 (×4): qty 1

## 2018-11-28 MED ORDER — CALCIUM CARBONATE ANTACID 500 MG PO CHEW
1.0000 | CHEWABLE_TABLET | Freq: Three times a day (TID) | ORAL | Status: DC
  Administered 2018-11-28 – 2018-11-30 (×5): 200 mg via ORAL
  Filled 2018-11-28 (×5): qty 1

## 2018-11-28 MED ORDER — SODIUM CHLORIDE 0.9 % IV BOLUS
500.0000 mL | Freq: Once | INTRAVENOUS | Status: AC
  Administered 2018-11-28: 13:00:00 500 mL via INTRAVENOUS

## 2018-11-28 MED ORDER — POLYETHYLENE GLYCOL 3350 17 G PO PACK
17.0000 g | PACK | Freq: Every day | ORAL | Status: DC
  Administered 2018-11-29: 17 g via ORAL
  Filled 2018-11-28 (×2): qty 1

## 2018-11-28 MED ORDER — CEFAZOLIN SODIUM-DEXTROSE 2-4 GM/100ML-% IV SOLN
2.0000 g | Freq: Three times a day (TID) | INTRAVENOUS | Status: AC
  Administered 2018-11-28 – 2018-11-30 (×6): 2 g via INTRAVENOUS
  Filled 2018-11-28 (×6): qty 100

## 2018-11-28 MED ORDER — SODIUM CHLORIDE 0.9 % IV BOLUS
1000.0000 mL | Freq: Once | INTRAVENOUS | Status: AC
  Administered 2018-11-28: 1000 mL via INTRAVENOUS

## 2018-11-28 NOTE — Progress Notes (Signed)
PT Cancellation Note  Patient Details Name: Nathan Chambers MRN: [id number] DOB: 07-16-1965   Cancelled Treatment:    Reason Eval/Treat Not Completed: Patient declined PT services this PM secondary to "just don't feel well, I just don't got it in me to do it this afternoon".  Will attempt to see pt at a future date as appropriate.     Linus Salmons PT, DPT 11/28/18, 2:30 PM

## 2018-11-28 NOTE — Progress Notes (Signed)
BP 80/45 taken manually in each arm. Pt asymptomatic. Denies pain or discomfort other than left knee pain. Denies chest pain or discomfort.  Dr.Menz notified and order received for 500 cc NS bolus. Will continue to monitor closely.

## 2018-11-28 NOTE — Progress Notes (Signed)
   Subjective: 1 Day Post-Op Procedure(s) (LRB): TOTAL KNEE REVISION REIMPLANTATION OF LEFT TOTAL KNEE (Left) Patient reports pain as severe.   Patient is with hypotension.  Denies feeling dizzy/lightheaded. Denies any CP, SOB, ABD pain. Patient unable to tolerate PT yesterday.  Objective: Vital signs in last 24 hours: Temp:  [97.5 F (36.4 C)-98.3 F (36.8 C)] 98 F (36.7 C) (07/29 0719) Pulse Rate:  [81-116] 92 (07/29 0719) Resp:  [10-21] 19 (07/28 2300) BP: (85-131)/(44-110) 92/59 (07/29 0719) SpO2:  [87 %-100 %] 93 % (07/29 0719)  Intake/Output from previous day: 07/28 0701 - 07/29 0700 In: 5050 [I.V.:5000; IV Piggyback:50] Out: 3250 [Urine:2550; Drains:200; Blood:500] Intake/Output this shift: No intake/output data recorded.  Recent Labs    11/27/18 1437 11/28/18 0340  HGB 13.2 12.5*   Recent Labs    11/27/18 1437 11/28/18 0340  WBC 15.0* 12.4*  RBC 4.51 4.24  HCT 40.4 38.7*  PLT 278 255   Recent Labs    11/27/18 1437 11/28/18 0340  NA  --  136  K  --  3.9  CL  --  99  CO2  --  25  BUN  --  11  CREATININE 0.90 1.45*  GLUCOSE  --  125*  CALCIUM  --  8.7*   No results for input(s): LABPT, INR in the last 72 hours.  EXAM General - Patient is Alert, Appropriate and Oriented Extremity - Neurovascular intact Sensation intact distally Intact pulses distally Dorsiflexion/Plantar flexion intact No cellulitis present Compartment soft Dressing - dressing C/D/I and Praveena intact, no drainage this morning, patient states canister was changed yesterday. Motor Function - intact, moving foot and toes well on exam.   Past Medical History:  Diagnosis Date  . Anxiety   . Arthritis    knees, hands  . Bipolar disorder ([place])   . Chronic kidney disease    arf 2014 due to dehydration  . Complication of anesthesia    woke up during last knee surgery feb 2020  . GERD (gastroesophageal reflux disease)    rare no meds  . Headache   . Hemorrhoids   .  Hyperlipidemia   . Hypertension   . LFT elevation   . NAFLD (nonalcoholic fatty liver disease)     Assessment/Plan:   1 Day Post-Op Procedure(s) (LRB): TOTAL KNEE REVISION REIMPLANTATION OF LEFT TOTAL KNEE (Left) Active Problems:   S/P revision of total knee, left   Status post revision of total knee  Estimated body mass index is 40.78 kg/m as calculated from the following:   Height as of this encounter: 6\' 3"  (1.905 m).   Weight as of this encounter: 148 kg. Advance diet Up with therapy  Needs bowel movement Hypotension -continue with IV fluids.  Monitor narcotic intake Infectious disease consulted.  Cultures pending continue with IV antibiotics Recheck labs in the morning Care manager to assist with discharge.  DVT Prophylaxis - Lovenox, Foot Pumps and TED hose Weight-Bearing as tolerated to left leg   T. Rachelle Hora, PA-C [place] 11/28/2018, 8:14 AM

## 2018-11-28 NOTE — Anesthesia Postprocedure Evaluation (Signed)
Anesthesia Post Note  Patient: LADON VANDENBERGHE  Procedure(s) Performed: TOTAL KNEE REVISION REIMPLANTATION OF LEFT TOTAL KNEE (Left Knee)  Patient location during evaluation: Nursing Unit Anesthesia Type: Spinal Level of consciousness: awake, awake and alert, oriented and patient cooperative Pain management: pain level controlled Vital Signs Assessment: post-procedure vital signs reviewed and stable Respiratory status: spontaneous breathing, nonlabored ventilation and respiratory function stable Cardiovascular status: stable Postop Assessment: no headache, no backache, patient able to bend at knees, adequate PO intake and no apparent nausea or vomiting Anesthetic complications: no     Last Vitals:  Vitals:   11/28/18 0719 11/28/18 0905  BP: (!) 92/59 (!) 93/57  Pulse: 92 89  Resp:    Temp: 36.7 C   SpO2: 93%     Last Pain:  Vitals:   11/28/18 0719  TempSrc: Oral  PainSc:                  Ricki Miller

## 2018-11-28 NOTE — Progress Notes (Signed)
Pt requiring round the clock pain management, alternating between the full dose of IV and p.o. pain medication. BP soft 85/44 in the am, pt educated on the effects of pain medication BP, states that he is aware, MD notified

## 2018-11-28 NOTE — Evaluation (Signed)
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  Pt is now s/p total knee revision reimplantation of left TKA.  PMH includes anxiety, bipolar disorder, CKD, and HTN.    Clinical Impression  Pt presents with deficits in strength, transfers, mobility, gait, balance, L knee ROM, and activity tolerance but performed well overall.  Pt initially reluctant to participate during the session secondary to L knee pain combined with limited ability to take pain meds secondary to soft BP but agreed to a limited session.  Pt was Mod Ind with bed mobility tasks with increased time and effort with sup to/from sit but no physical assistance required.  Pt was able to stand from an elevated EOB with CGA and min verbal cues for sequencing.  Once in standing pt reported no adverse symptoms other than L knee pain.  Pt eager to ambulate once in standing.   Educated pt on taking steps near the EOB only this session secondary to soft BP but pt impulsive and began ambulating toward room door.  Again encourage pt to stay near EOB for safety with pt compromising and agreeing to amb to wall and back.  Pt was able to amb grossly 30' in room before deciding to return to sitting secondary to L knee pain with HR and SpO2 WNL and without any orthostatic symptoms.  Pt will benefit from HHPT services upon discharge to safely address above deficits for decreased caregiver assistance and eventual return to PLOF.      Follow Up Recommendations Home health PT    Equipment Recommendations  None recommended by PT    Recommendations for Other Services       Precautions / Restrictions Precautions Precautions: Knee Restrictions Weight Bearing Restrictions: Yes LLE Weight Bearing: Weight  bearing as tolerated      Mobility  Bed Mobility Overal bed mobility: Modified Independent             General bed mobility comments: Extra time and effort during sup to/from sit but no physical assistance required  Transfers Overall transfer level: Needs assistance Equipment used: Rolling walker (2 wheeled) Transfers: Sit to/from Stand Sit to Stand: Min guard;From elevated surface         General transfer comment: Min verbal cues for sequencing with increased effort to stand from an elevated surface  Ambulation/Gait Ambulation/Gait assistance: Min guard Gait Distance (Feet): 30 Feet Assistive device: Rolling walker (2 wheeled) Gait Pattern/deviations: Step-to pattern;Antalgic;Decreased stance time - left Gait velocity: decreased   General Gait Details: Heavy lean on the RW with amb but steady without LOB or buckling, no adverse symptoms other than L knee pain noted during the session  Stairs            Wheelchair Mobility    Modified Rankin (Stroke Patients Only)       Balance Overall balance assessment: Needs assistance   Sitting balance-Leahy Scale: Normal     Standing balance support: Bilateral upper extremity supported Standing balance-Leahy Scale: Good                               Pertinent Vitals/Pain Pain Assessment: 0-10 Pain Score: 7  Pain Descriptors / Indicators: Aching;Sore Pain Intervention(s): Monitored during  session;Limited activity within patient's tolerance    Home Living Family/patient expects to be discharged to:: Private residence Living Arrangements: Spouse/significant other;Children Available Help at Discharge: Family;Available 24 hours/day Type of Home: House Home Access: Ramped entrance     Home Layout: One level Home Equipment: Walker - 2 wheels;Shower seat;Toilet riser;Bedside commode      Prior Function Level of Independence: Independent with assistive device(s)         Comments: Ambulatory  with RW HH distances limited by TTWB to the LLE since May, 2020     Hand Dominance        Extremity/Trunk Assessment   Upper Extremity Assessment Upper Extremity Assessment: Overall WFL for tasks assessed    Lower Extremity Assessment Lower Extremity Assessment: Generalized weakness;LLE deficits/detail LLE: Unable to fully assess due to pain LLE Sensation: history of peripheral neuropathy       Communication   Communication: No difficulties  Cognition Arousal/Alertness: Awake/alert Behavior During Therapy: WFL for tasks assessed/performed;Impulsive Overall Cognitive Status: Within Functional Limits for tasks assessed                                        General Comments      Exercises Total Joint Exercises Quad Sets: Strengthening;AROM;Left;5 reps;10 reps Heel Slides: AROM;Left;10 reps Hip ABduction/ADduction: AROM;Both;5 reps Straight Leg Raises: AROM;Both;5 reps Long Arc Quad: AROM;Left;5 reps;10 reps Knee Flexion: AROM;Left;10 reps;5 reps Goniometric ROM: L knee AROM: 10-88 deg, limited by pain Other Exercises Other Exercises: HEP education and review per handout and positioning education for L knee ROM   Assessment/Plan    PT Assessment Patient needs continued PT services  PT Problem List Decreased strength;Decreased range of motion;Decreased activity tolerance       PT Treatment Interventions DME instruction;Gait training;Stair training;Functional mobility training;Therapeutic activities;Therapeutic exercise;Balance training;Patient/family education    PT Goals (Current goals can be found in the Care Plan section)  Acute Rehab PT Goals Patient Stated Goal: Decreased L knee pain and return home PT Goal Formulation: With patient Time For Goal Achievement: 12/11/18 Potential to Achieve Goals: Good    Frequency BID   Barriers to discharge        Co-evaluation               AM-PAC PT "6 Clicks" Mobility  Outcome Measure Help  needed turning from your back to your side while in a flat bed without using bedrails?: A Little Help needed moving from lying on your back to sitting on the side of a flat bed without using bedrails?: A Little Help needed moving to and from a bed to a chair (including a wheelchair)?: A Little Help needed standing up from a chair using your arms (e.g., wheelchair or bedside chair)?: A Little Help needed to walk in hospital room?: A Little Help needed climbing 3-5 steps with a railing? : A Little 6 Click Score: 18    End of Session Equipment Utilized During Treatment: Gait belt Activity Tolerance: Patient tolerated treatment well Patient left: in bed;with call bell/phone within reach;with bed alarm set;with SCD's reapplied;Other (comment)(Polar care donned to L knee) Nurse Communication: Mobility status PT Visit Diagnosis: Muscle weakness (generalized) (M62.81);Other abnormalities of gait and mobility (R26.89)    Time: 3818-2993 PT Time Calculation (min) (ACUTE ONLY): 38 min   Charges:   PT Evaluation $PT Eval Low Complexity: 1 Low PT Treatments $Therapeutic Exercise: 8-22 mins  Linus Salmons PT, DPT 11/28/18, 11:45 AM

## 2018-11-28 NOTE — Progress Notes (Signed)
Pt continues  to c/o indigestion unrelieved by Maalox. Dr.Menz notified and orders received.

## 2018-11-29 DIAGNOSIS — Z978 Presence of other specified devices: Secondary | ICD-10-CM

## 2018-11-29 DIAGNOSIS — Z96652 Presence of left artificial knee joint: Secondary | ICD-10-CM

## 2018-11-29 LAB — BASIC METABOLIC PANEL
Anion gap: 9 (ref 5–15)
BUN: 10 mg/dL (ref 6–20)
CO2: 28 mmol/L (ref 22–32)
Calcium: 8.5 mg/dL — ABNORMAL LOW (ref 8.9–10.3)
Chloride: 97 mmol/L — ABNORMAL LOW (ref 98–111)
Creatinine, Ser: 0.8 mg/dL (ref 0.61–1.24)
GFR calc Af Amer: >60 mL/min (ref >60)
GFR calc non Af Amer: >60 mL/min (ref >60)
Glucose, Bld: 123 mg/dL — ABNORMAL HIGH (ref 70–99)
Potassium: 3.3 mmol/L — ABNORMAL LOW (ref 3.5–5.1)
Sodium: 134 mmol/L — ABNORMAL LOW (ref 135–145)

## 2018-11-29 MED ORDER — POTASSIUM CHLORIDE 20 MEQ PO PACK
20.0000 meq | PACK | Freq: Three times a day (TID) | ORAL | Status: AC
  Administered 2018-11-29 (×3): 20 meq via ORAL
  Filled 2018-11-29 (×3): qty 1

## 2018-11-29 MED ORDER — ALUM & MAG HYDROXIDE-SIMETH 200-200-20 MG/5ML PO SUSP
30.0000 mL | Freq: Four times a day (QID) | ORAL | Status: DC | PRN
  Administered 2018-11-29: 30 mL via ORAL
  Filled 2018-11-29: qty 30

## 2018-11-29 NOTE — Progress Notes (Signed)
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  Pt is now s/p total knee revision reimplantation of left TKA.  PMH includes anxiety, bipolar disorder, CKD, and HTN.    PT Comments    Pt is able to ambulate fully around the nurses' station showing great motivation and effort.  He was reliant on UEs (reporting feeling more tired in UEs than LEs after the loop).  He still has considerable pain with most ROM/strength activities.  He needed extra time and cuing for most tasks but did very well POD2 and again showed good effort.  He lacks full TKE but did have >90 of flexion after multiple PROM reps.  Pt still unable to do SLRs 2/2 pain and did not have AROM with SAQ due to pain.  Overall pt did well and showed considerable gains this date with PT.  Follow Up Recommendations  Home health PT     Equipment Recommendations  None recommended by PT    Recommendations for Other Services       Precautions / Restrictions Precautions Precautions: Knee Restrictions LLE Weight Bearing: Weight bearing as tolerated    Mobility  Bed Mobility Overal bed mobility: Modified Independent             General bed mobility comments: Extra time and effort during sup to/from sit but no physical assistance required  Transfers Overall transfer level: Modified independent Equipment used: Rolling walker (2 wheeled) Transfers: Sit to/from Stand Sit to Stand: Min guard;From elevated surface         General transfer comment: Pt able to rise from higher height surface with minimal UE use and heavier UE use from standard/lower surfaces  Ambulation/Gait Ambulation/Gait assistance: Min guard Gait Distance (Feet): 200 Feet Assistive device: Rolling walker (2  wheeled)       General Gait Details: Pt continues to be reliant on walker for any prolonged time on feet, but was able to fully circumambulate the nurses' station.  He showed great effort and though he was unable to fully extend L knee during stance phase and was forward leaning on walker he ultimately did not have any safety issues and was highly motivated to push himself.  This is all the more impressive as he has been essentially w/c bound for the last few months with TTWBing/spacer in L knee.    Stairs             Wheelchair Mobility    Modified Rankin (Stroke Patients Only)       Balance Overall balance assessment: Modified Independent   Sitting balance-Leahy Scale: Normal       Standing balance-Leahy Scale: Good                              Cognition Arousal/Alertness: Awake/alert Behavior During Therapy: WFL for tasks assessed/performed;Impulsive Overall Cognitive Status: Within Functional Limits for tasks assessed                                        Exercises Total Joint Exercises Ankle Circles/Pumps: Strengthening;15 reps Quad Sets: Strengthening;20 reps Short Arc Quad: AAROM;5 reps(Pt c/o too much pain  to continue more than a few AAROM reps) Heel Slides: AROM;Left;10 reps(resisted leg extensions) Hip ABduction/ADduction: Strengthening;10 reps Straight Leg Raises: AAROM;PROM(only tolerated 2 reps 2/2 pain) Knee Flexion: PROM;5 reps Goniometric ROM: 3-91    General Comments        Pertinent Vitals/Pain Pain Assessment: 0-10 Pain Score: 8  Pain Location: L knee, mostly joint line swelling/throbbing pain    Home Living                      Prior Function            PT Goals (current goals can now be found in the care plan section) Progress towards PT goals: Progressing toward goals    Frequency    BID      PT Plan Current plan remains appropriate    Co-evaluation              AM-PAC PT  "6 Clicks" Mobility   Outcome Measure  Help needed turning from your back to your side while in a flat bed without using bedrails?: A Little Help needed moving from lying on your back to sitting on the side of a flat bed without using bedrails?: A Little Help needed moving to and from a bed to a chair (including a wheelchair)?: A Little Help needed standing up from a chair using your arms (e.g., wheelchair or bedside chair)?: A Little Help needed to walk in hospital room?: A Little Help needed climbing 3-5 steps with a railing? : A Little 6 Click Score: 18    End of Session Equipment Utilized During Treatment: Gait belt Activity Tolerance: Patient tolerated treatment well Patient left: in bed;with call bell/phone within reach Nurse Communication: Mobility status(informed RN that bed power source needed addressed) PT Visit Diagnosis: Muscle weakness (generalized) (M62.81);Other abnormalities of gait and mobility (R26.89)     Time: 0923-3007 PT Time Calculation (min) (ACUTE ONLY): 55 min  Charges:  $Gait Training: 8-22 mins $Therapeutic Exercise: 23-37 mins $Therapeutic Activity: 8-22 mins                     Kreg Shropshire, DPT 11/29/2018, 1:44 PM

## 2018-11-29 NOTE — Progress Notes (Signed)
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  Pt is now s/p total knee revision reimplantation of left TKA.  PMH includes anxiety, bipolar disorder, CKD, and HTN.    PT Comments    Pt c/o considerable pain and soreness after prolonged ambulation bout this AM.  He agreed to do some supine exercises but did not feel that he was up for mobility, ambulation this afternoon.  He continues to struggle with against gravity quad activities, but did show good effort with exercises and good execution with eccentric and controlled AROM acts.  Pt eager to do another long walk tomorrow and show improvement with exercises.   Follow Up Recommendations  Home health PT     Equipment Recommendations  None recommended by PT    Recommendations for Other Services       Precautions / Restrictions Precautions Precautions: Knee Restrictions LLE Weight Bearing: Weight bearing as tolerated    Mobility  Bed Mobility               General bed mobility comments: deferred mobility secondary to pain this afternoon  Transfers                    Ambulation/Gait                 Stairs             Wheelchair Mobility    Modified Rankin (Stroke Patients Only)       Balance                                            Cognition Arousal/Alertness: Awake/alert Behavior During Therapy: WFL for tasks assessed/performed;Impulsive Overall Cognitive Status: Within Functional Limits for tasks assessed                                        Exercises Total Joint Exercises Ankle Circles/Pumps: Strengthening;20 reps Quad Sets: Strengthening;20 reps Short Arc Quad: AAROM;5 reps(still hurting too much  with AROM quad work) Heel Slides: AROM;10 reps(resisted leg extensions) Hip ABduction/ADduction: Strengthening;15 reps    General Comments        Pertinent Vitals/Pain Pain Score: 7     Home Living                      Prior Function            PT Goals (current goals can now be found in the care plan section) Progress towards PT goals: Progressing toward goals    Frequency    BID      PT Plan Current plan remains appropriate    Co-evaluation              AM-PAC PT "6 Clicks" Mobility   Outcome Measure  Help needed turning from your back to your side while in a flat bed without using bedrails?: A Little Help needed moving from lying on your back to sitting on the side of a flat bed without using bedrails?: A Little Help needed moving to  and from a bed to a chair (including a wheelchair)?: A Little Help needed standing up from a chair using your arms (e.g., wheelchair or bedside chair)?: A Little Help needed to walk in hospital room?: A Little Help needed climbing 3-5 steps with a railing? : A Little 6 Click Score: 18    End of Session Equipment Utilized During Treatment: Gait belt Activity Tolerance: Patient tolerated treatment well Patient left: in bed;with call bell/phone within reach   PT Visit Diagnosis: Muscle weakness (generalized) (M62.81);Other abnormalities of gait and mobility (R26.89)     Time: 1275-1700 PT Time Calculation (min) (ACUTE ONLY): 17 min  Charges:  $Therapeutic Exercise: 8-22 mins                     Kreg Shropshire, DPT 11/29/2018, 5:41 PM

## 2018-11-29 NOTE — Progress Notes (Signed)
   Subjective: 2 Days Post-Op Procedure(s) (LRB): TOTAL KNEE REVISION REIMPLANTATION OF LEFT TOTAL KNEE (Left) Patient reports pain as moderate. Blood pressure improved.  Asymptomatic.  Unable to tolerate PT yesterday. Denies any CP, SOB, ABD pain.   Objective: Vital signs in last 24 hours: Temp:  [97.8 F (36.6 C)-98.5 F (36.9 C)] 97.8 F (36.6 C) (07/30 0737) Pulse Rate:  [82-96] 92 (07/30 0737) Resp:  [18-20] 19 (07/29 2324) BP: (80-118)/(46-78) 116/53 (07/30 0737) SpO2:  [92 %-94 %] 93 % (07/30 0737)  Intake/Output from previous day: 07/29 0701 - 07/30 0700 In: 1400 [P.O.:600; I.V.:500; IV Piggyback:300] Out: 2225 [Urine:2075; Drains:150] Intake/Output this shift: No intake/output data recorded.  Recent Labs    11/27/18 1437 11/28/18 0340  HGB 13.2 12.5*   Recent Labs    11/27/18 1437 11/28/18 0340  WBC 15.0* 12.4*  RBC 4.51 4.24  HCT 40.4 38.7*  PLT 278 255   Recent Labs    11/28/18 0340 11/29/18 0403  NA 136 134*  K 3.9 3.3*  CL 99 97*  CO2 25 28  BUN 11 10  CREATININE 1.45* 0.80  GLUCOSE 125* 123*  CALCIUM 8.7* 8.5*   No results for input(s): LABPT, INR in the last 72 hours.  EXAM General - Patient is Alert, Appropriate and Oriented Extremity - Neurovascular intact Sensation intact distally Intact pulses distally Dorsiflexion/Plantar flexion intact No cellulitis present Compartment soft Dressing - dressing C/D/I and Praveena intact, 300 cc of serosanguineous output Motor Function - intact, moving foot and toes well on exam.   Past Medical History:  Diagnosis Date  . Anxiety   . Arthritis    knees, hands  . Bipolar disorder ([place])   . Chronic kidney disease    arf 2014 due to dehydration  . Complication of anesthesia    woke up during last knee surgery feb 2020  . GERD (gastroesophageal reflux disease)    rare no meds  . Headache   . Hemorrhoids   . Hyperlipidemia   . Hypertension   . LFT elevation   . NAFLD (nonalcoholic  fatty liver disease)     Assessment/Plan:   2 Days Post-Op Procedure(s) (LRB): TOTAL KNEE REVISION REIMPLANTATION OF LEFT TOTAL KNEE (Left) Active Problems:   S/P revision of total knee, left   Status post revision of total knee  Estimated body mass index is 40.78 kg/m as calculated from the following:   Height as of this encounter: 6\' 3"  (1.905 m).   Weight as of this encounter: 148 kg. Advance diet Up with therapy  Needs bowel movement Hypotension -resolved Hypokalemia -oral potassium supplement added Recheck labs in the morning Infectious disease consulted.  Cultures pending continue with IV antibiotics.  No growth 24 hours Care manager to assist with discharge.  DVT Prophylaxis - Lovenox, Foot Pumps and TED hose Weight-Bearing as tolerated to left leg   T. Rachelle Hora, PA-C [place] 11/29/2018, 8:51 AM

## 2018-11-29 NOTE — TOC Initial Note (Signed)
Transition of Care [facility]) - Initial/Assessment Note    Patient Details  Name: Nathan Chambers MRN: [id number] Date of Birth: 14-Oct-1965  [facility]) CM/SW Contact:    Su Hilt, RN Phone Number: 11/29/2018, 3:52 PM  Clinical Narrative:                 The patient lives at home with his wife and children, he stated that he does not need PT but does approv of having Home health wound care as he has [facility] the past with [facility], notified Corene Cornea. Has DME at home and does not need additional, Dr Rudene Christians told the patient that the hospital would provide him a few honey comb dressings and a couple of conisters for the [facility] as he is draining a lot. I notified the nurse of what Dr Rudene Christians is requesting  Expected Discharge Plan: [place] Barriers to Discharge: Continued Medical Work up   Patient Goals and CMS Choice Patient states their goals for this hospitalization and ongoing recovery are:: go home CMS Medicare.gov Compare Post Acute Care list provided to:: Patient Choice offered to / list presented to : Patient  Expected Discharge Plan and Services Expected Discharge Plan: [place]   Discharge Planning Services: CM Consult Post Acute Care Choice: Home Health   Expected Discharge Date: 11/29/18               DME Arranged: N/A         HH Arranged: RN [place]: [place] ([place]) Date [place]: 11/29/18 Time [place]: 53 Representative spoke with at [place]: [place] Arrangements/Services   Lives with:: Self Patient language and need for interpreter reviewed:: No Do you feel safe going back to the place where you live?: Yes      Need for Family Participation in Patient Care: No (Comment) Care giver support system in place?: Yes (comment) Current home services: DME(RW, Shower seat, BSC, raised toilet) Criminal Activity/Legal Involvement Pertinent to Current  Situation/Hospitalization: No - Comment as needed  Activities of Daily Living Home Assistive Devices/Equipment: Environmental consultant (specify type), Cane (specify quad or straight) ADL Screening (condition at time of admission) Patient's cognitive ability adequate to safely complete daily activities?: Yes Is the patient deaf or have difficulty hearing?: No Does the patient have difficulty seeing, even when wearing glasses/contacts?: No Does the patient have difficulty concentrating, remembering, or making decisions?: No Patient able to express need for assistance with ADLs?: Yes Does the patient have difficulty dressing or bathing?: No Independently performs ADLs?: Yes (appropriate for developmental age) Does the patient have difficulty walking or climbing stairs?: Yes Weakness of Legs: Left Weakness of Arms/Hands: None  Permission Sought/Granted   Permission granted to share information with : Yes, Verbal Permission Granted              Emotional Assessment Appearance:: Appears stated age Attitude/Demeanor/Rapport: Engaged Affect (typically observed): Accepting Orientation: : Oriented to Self, Oriented to Place, Oriented to  Time, Oriented to Situation Alcohol / Substance Use: Not Applicable Psych Involvement: No (comment)  Admission diagnosis:  Status post revision of total knee [Z96.659] Patient Active Problem List   Diagnosis Date Noted  . S/P revision of total knee, left 11/27/2018  . Status post revision of total knee 11/27/2018  . Chronic radicular lumbar pain 08/28/2018  . History of bilateral knee replacement 08/28/2018  . Lumbar radicular pain 08/28/2018  . Lumbar  facet arthropathy 08/28/2018  . Spinal stenosis of lumbar region without neurogenic claudication 08/28/2018  . Lumbar spondylosis 08/28/2018  . Neuroforaminal stenosis of lumbar spine 08/28/2018  . Sensory polyneuropathy 08/28/2018  . Infection of total left knee replacement ([place]) 06/10/2018  . Arthritis, septic,  knee ([place]) 12/08/2017  . Hyperglycemia, unspecified 02/16/2016  . Anxiety 01/26/2015  . Hyperlipidemia 01/26/2015  . Hypertension 01/26/2015   PCP:  Juluis Pitch, MD Pharmacy:   [facility] [phone] - [address] [place] [address] Phone: [phone] Fax: [phone]     Social Determinants of Health (SDOH) Interventions    Readmission Risk Interventions No flowsheet data found.

## 2018-11-29 NOTE — Progress Notes (Signed)
ID Doing well Coloring book  to keep Nathan Chambers calm Patient Vitals for the past 24 hrs:  BP Temp Pulse Resp SpO2  11/29/18 0737 (!) 116/53 97.8 F (36.6 C) 92 - 93 %  11/28/18 2324 100/66 98.5 F (36.9 C) 87 19 92 %  11/28/18 1800 101/70 - 86 18 -    Left knee wound VAC present Cold wraps  CBC Latest Ref Rng & Units 11/28/2018 11/27/2018 11/19/2018  WBC 4.0 - 10.5 K/uL 12.4(H) 15.0(H) 8.7  Hemoglobin 13.0 - 17.0 g/dL 12.5(L) 13.2 14.1  Hematocrit 39.0 - 52.0 % 38.7(L) 40.4 42.0  Platelets 150 - 400 K/uL 255 278 295    CMP Latest Ref Rng & Units 11/29/2018 11/28/2018 11/27/2018  Glucose 70 - 99 mg/dL 123(H) 125(H) -  BUN 6 - 20 mg/dL 10 11 -  Creatinine 0.61 - 1.24 mg/dL 0.80 1.45(H) 0.90  Sodium 135 - 145 mmol/L 134(L) 136 -  Potassium 3.5 - 5.1 mmol/L 3.3(L) 3.9 -  Chloride 98 - 111 mmol/L 97(L) 99 -  CO2 22 - 32 mmol/L 28 25 -  Calcium 8.9 - 10.3 mg/dL 8.5(L) 8.7(L) -  Total Protein 6.5 - 8.1 g/dL - - -  Total Bilirubin 0.3 - 1.2 mg/dL - - -  Alkaline Phos 38 - 126 U/L - - -  AST 15 - 41 U/L - - -  ALT 0 - 44 U/L - - -   Impression recommendation Left Total knee revision reimplantation of left total knee- await finalization of culture- sp far neg-currently on cefazolin and rifampin.  On discharge Nathan Chambers will go on Keflex 500 mg every 6 hours for 4 weeks and rifampin 300 mg twice a day for 4 weeks.  Further antibiotics will be decided after I see Nathan Chambers in my clinic in 3 weeks time.  Discussed the management with the patient in detail.

## 2018-11-29 NOTE — Plan of Care (Signed)

## 2018-11-29 NOTE — Progress Notes (Signed)
Patient refused bed alarm. Cartridge changed to wound vac

## 2018-11-30 LAB — CBC
HCT: 33.4 % — ABNORMAL LOW (ref 39.0–52.0)
Hemoglobin: 11.3 g/dL — ABNORMAL LOW (ref 13.0–17.0)
MCH: 29.6 pg (ref 26.0–34.0)
MCHC: 33.8 g/dL (ref 30.0–36.0)
MCV: 87.4 fL (ref 80.0–100.0)
Platelets: 245 10*3/uL (ref 150–400)
RBC: 3.82 MIL/uL — ABNORMAL LOW (ref 4.22–5.81)
RDW: 13.3 % (ref 11.5–15.5)
WBC: 8.2 10*3/uL (ref 4.0–10.5)
nRBC: 0 % (ref 0.0–0.2)

## 2018-11-30 LAB — BASIC METABOLIC PANEL
Anion gap: 9 (ref 5–15)
BUN: 8 mg/dL (ref 6–20)
CO2: 28 mmol/L (ref 22–32)
Calcium: 9.2 mg/dL (ref 8.9–10.3)
Chloride: 98 mmol/L (ref 98–111)
Creatinine, Ser: 0.78 mg/dL (ref 0.61–1.24)
GFR calc Af Amer: >60 mL/min (ref >60)
GFR calc non Af Amer: >60 mL/min (ref >60)
Glucose, Bld: 132 mg/dL — ABNORMAL HIGH (ref 70–99)
Potassium: 3.8 mmol/L (ref 3.5–5.1)
Sodium: 135 mmol/L (ref 135–145)

## 2018-11-30 MED ORDER — RIFAMPIN 300 MG PO CAPS: 300.0000 mg | ORAL_CAPSULE | Freq: Two times a day (BID) | ORAL | 0 refills | Status: DC

## 2018-11-30 MED ORDER — ENOXAPARIN SODIUM 40 MG/0.4ML ~~LOC~~ SOLN: 40.0000 mg | SUBCUTANEOUS | 0 refills | Status: DC

## 2018-11-30 MED ORDER — CEPHALEXIN 500 MG PO CAPS: 500.0000 mg | ORAL_CAPSULE | Freq: Four times a day (QID) | ORAL | 0 refills | Status: DC

## 2018-11-30 MED ORDER — OXYCODONE HCL 10 MG PO TABS: 10.0000 mg | ORAL_TABLET | ORAL | 0 refills | Status: DC | PRN

## 2018-11-30 MED ORDER — OXYCODONE HCL ER 15 MG PO T12A: 15.0000 mg | EXTENDED_RELEASE_TABLET | Freq: Two times a day (BID) | ORAL | 0 refills | Status: AC

## 2018-11-30 NOTE — Discharge Summary (Signed)
Physician Discharge Summary  Patient ID: Nathan Chambers MRN: [id number] DOB/AGE: [birth year] 53 y.o.  Admit date: 11/27/2018 Discharge date: 11/30/2018  Admission Diagnoses:  Status post revision of total knee [Z96.659]   Discharge Diagnoses: Patient Active Problem List   Diagnosis Date Noted  . S/P revision of total knee, left 11/27/2018  . Status post revision of total knee 11/27/2018  . Chronic radicular lumbar pain 08/28/2018  . History of bilateral knee replacement 08/28/2018  . Lumbar radicular pain 08/28/2018  . Lumbar facet arthropathy 08/28/2018  . Spinal stenosis of lumbar region without neurogenic claudication 08/28/2018  . Lumbar spondylosis 08/28/2018  . Neuroforaminal stenosis of lumbar spine 08/28/2018  . Sensory polyneuropathy 08/28/2018  . Infection of total left knee replacement ([place]) 06/10/2018  . Arthritis, septic, knee ([place]) 12/08/2017  . Hyperglycemia, unspecified 02/16/2016  . Anxiety 01/26/2015  . Hyperlipidemia 01/26/2015  . Hypertension 01/26/2015    Past Medical History:  Diagnosis Date  . Anxiety   . Arthritis    knees, hands  . Bipolar disorder ([place])   . Chronic kidney disease    arf 2014 due to dehydration  . Complication of anesthesia    woke up during last knee surgery feb 2020  . GERD (gastroesophageal reflux disease)    rare no meds  . Headache   . Hemorrhoids   . Hyperlipidemia   . Hypertension   . LFT elevation   . NAFLD (nonalcoholic fatty liver disease)      Transfusion: None   Consultants (if any): Infectious disease  Discharged Condition: Improved  Hospital Course: Nathan Chambers is an 53 y.o. male who was admitted 11/27/2018 with a diagnosis of <principal problem not specified> and went to the operating room on 11/27/2018 and underwent the above named procedures.    Surgeries: Procedure(s): TOTAL KNEE REVISION REIMPLANTATION OF LEFT TOTAL KNEE on 11/27/2018 Patient tolerated the surgery well. Taken to PACU where she  was stabilized and then transferred to the orthopedic floor.  Started on Lovenox 30 mg q 12 hrs. Foot pumps applied bilaterally at 80 mm. Heels elevated on bed with rolled towels. No evidence of DVT. Negative [place]. Physical therapy started on day #1 for gait training and transfer. OT started day #1 for ADL and assisted devices.  Patient's foley was d/c on day #1. Patient's IV was d/c on day #2.  On post op day #3 patient was stable and ready for discharge to home with home health PT.  Infectious disease placed recommendations for patient to take rifampin and cephalexin for 4 weeks.  Patient will follow-up with infectious disease in 3 weeks.  Implants:  Verner Chol K sphere system, 5 left femur, 4 tibia with 5 mm augments medial and lateral and short stem, 2 patella, all components cemented with 20 mm insert   He was given perioperative antibiotics:  Anti-infectives (From admission, onward)   Start     Dose/Rate Route Frequency Ordered Stop   11/30/18 0000  rifampin (RIFADIN) 300 MG capsule     300 mg Oral Every 12 hours 11/30/18 1222 12/28/18 2359   11/30/18 0000  cephALEXin (KEFLEX) 500 MG capsule     500 mg Oral 4 times daily 11/30/18 1222 12/28/18 2359   11/29/18 1000  rifampin (RIFADIN) capsule 300 mg     300 mg Oral Every 12 hours 11/28/18 0047     11/28/18 1600  ceFAZolin (ANCEF) IVPB 2g/100 mL premix     2 g 200 mL/hr over 30 Minutes Intravenous Every  8 hours 11/28/18 1513 11/30/18 0944   11/27/18 1600  ceFAZolin (ANCEF) 3 g in dextrose 5 % 50 mL IVPB  Status:  Discontinued     3 g 100 mL/hr over 30 Minutes Intravenous Every 8 hours 11/27/18 1356 11/28/18 1513   11/27/18 1400  ceFAZolin (ANCEF) IVPB 2g/100 mL premix  Status:  Discontinued     2 g 200 mL/hr over 30 Minutes Intravenous Every 8 hours 11/27/18 1327 11/27/18 1448   11/26/18 2215  ceFAZolin (ANCEF) 3 g in dextrose 5 % 50 mL IVPB     3 g 100 mL/hr over 30 Minutes Intravenous  Once 11/26/18 2210 11/27/18 0815     .  He was given sequential compression devices, early ambulation, and Lovenox, teds for DVT prophylaxis.  He benefited maximally from the hospital stay and there were no complications.    Recent vital signs:  Vitals:   11/29/18 2350 11/30/18 0828  BP:  (!) 148/82  Pulse: 93 96  Resp:  17  Temp:  97.6 F (36.4 C)  SpO2: 94% 93%    Recent laboratory studies:  Lab Results  Component Value Date   HGB 11.3 (L) 11/30/2018   HGB 12.5 (L) 11/28/2018   HGB 13.2 11/27/2018   Lab Results  Component Value Date   WBC 8.2 11/30/2018   PLT 245 11/30/2018   Lab Results  Component Value Date   INR 1.0 11/19/2018   Lab Results  Component Value Date   NA 135 11/30/2018   K 3.8 11/30/2018   CL 98 11/30/2018   CO2 28 11/30/2018   BUN 8 11/30/2018   CREATININE 0.78 11/30/2018   GLUCOSE 132 (H) 11/30/2018    Discharge Medications:   Allergies as of 11/30/2018      Reactions   Etodolac Itching      Medication List    TAKE these medications   acetaminophen 325 MG tablet Commonly known as: TYLENOL Take 1-2 tablets (325-650 mg total) by mouth every 6 (six) hours as needed for mild pain (pain score 1-3 or temp > 100.5).   amitriptyline 50 MG tablet Commonly known as: ELAVIL Take 50 mg by mouth at bedtime.   amLODipine-benazepril 10-40 MG capsule Commonly known as: LOTREL Take 1 capsule by mouth every morning.   aspirin EC 81 MG tablet Take 81 mg by mouth daily.   busPIRone 5 MG tablet Commonly known as: BUSPAR Take 10 mg by mouth 2 (two) times daily.   cephALEXin 500 MG capsule Commonly known as: Keflex Take 1 capsule (500 mg total) by mouth 4 (four) times daily for 28 days.   chlorthalidone 25 MG tablet Commonly known as: HYGROTON Take 12.5 mg by mouth daily.   docusate sodium 100 MG capsule Commonly known as: COLACE Take 1 capsule (100 mg total) by mouth 2 (two) times daily.   doxepin 10 MG capsule Commonly known as: SINEQUAN Take 20 mg by mouth at  bedtime.   DULoxetine 60 MG capsule Commonly known as: CYMBALTA Take 60 mg by mouth every morning.   enoxaparin 40 MG/0.4ML injection Commonly known as: LOVENOX Inject 0.4 mLs (40 mg total) into the skin daily for 14 days. What changed: Another medication with the same name was added. Make sure you understand how and when to take each.   enoxaparin 40 MG/0.4ML injection Commonly known as: Lovenox Inject 0.4 mLs (40 mg total) into the skin daily for 14 days. What changed: You were already taking a medication with the same name,  and this prescription was added. Make sure you understand how and when to take each.   folic acid 734 MCG tablet Commonly known as: FOLVITE Take 800 mcg by mouth daily.   gemfibrozil 600 MG tablet Commonly known as: LOPID Take 600 mg by mouth 2 (two) times daily before a meal.   meloxicam 15 MG tablet Commonly known as: MOBIC Take 15 mg by mouth daily.   multivitamin-lutein Caps capsule Take 1 capsule by mouth daily.   multivitamin with minerals tablet Take 1 tablet by mouth daily.   naloxone 0.4 MG/ML injection Commonly known as: NARCAN Inject 1 mL (0.4 mg total) into the vein as needed (respiratory rate < 10).   Oxycodone HCl 10 MG Tabs Take 1-1.5 tablets (10-15 mg total) by mouth every 4 (four) hours as needed for severe pain (pain score 7-10). What changed: how much to take   oxyCODONE 15 mg 12 hr tablet Commonly known as: OXYCONTIN Take 1 tablet (15 mg total) by mouth every 12 (twelve) hours for 10 days. What changed:   medication strength  how much to take   PARoxetine 20 MG tablet Commonly known as: PAXIL Take 40 mg by mouth daily.   pravastatin 20 MG tablet Commonly known as: PRAVACHOL Take 20 mg by mouth every evening.   rifampin 300 MG capsule Commonly known as: RIFADIN Take 1 capsule (300 mg total) by mouth every 12 (twelve) hours for 28 days.   vitamin B-12 500 MCG tablet Commonly known as: CYANOCOBALAMIN Take 250 mcg  by mouth daily.   vitamin C 500 MG tablet Commonly known as: ASCORBIC ACID Take 500 mg by mouth daily.       Diagnostic Studies: Dg Knee 1-2 Views Left  Result Date: 11/27/2018 CLINICAL DATA:  Status post left knee replacement. EXAM: LEFT KNEE - 1-2 VIEW COMPARISON:  09/11/2018 FINDINGS: Status post left knee total arthroplasty with expected overlying postop changes and knee joint effusion. No evidence of perihardware fracture or dislocation. Antibiotic packing beads, predominantly about the distal femur. IMPRESSION: Status post left knee total arthroplasty with expected overlying postop changes and knee joint effusion. No evidence of perihardware fracture or dislocation. Antibiotic packing beads, predominantly about the distal femur. Electronically Signed   By: Eddie Candle M.D.   On: 11/27/2018 11:31    Disposition:     Follow-up Information    Duanne Guess, PA-C Follow up in 17 day(s).   Specialties: Orthopedic Surgery, Emergency Medicine Contact information: [place] [address] [phone]            Signed: Feliberto Gottron 11/30/2018, 12:24 PM

## 2018-11-30 NOTE — Discharge Instructions (Signed)
TOTAL KNEE REVISION POSTOPERATIVE DIRECTIONS  Knee Rehabilitation, Guidelines Following Surgery  Results after knee surgery are often greatly improved when you follow the exercise, range of motion and muscle strengthening exercises prescribed by your doctor. Safety measures are also important to protect the knee from further injury. Any time any of these exercises cause you to have increased pain or swelling in your knee joint, decrease the amount until you are comfortable again and slowly increase them. If you have problems or questions, call your caregiver or physical therapist for advice.   HOME CARE INSTRUCTIONS  Remove items at home which could result in a fall. This includes throw rugs or furniture in walking pathways.   Continue to use polar care unit on the knee for pain and swelling from surgery. You may notice swelling that will progress down to the foot and ankle.  This is normal after surgery.  Elevate the leg when you are not up walking on it.    Continue to use the breathing machine which will help keep your temperature down.  It is common for your temperature to cycle up and down following surgery, especially at night when you are not up moving around and exerting yourself.  The breathing machine keeps your lungs expanded and your temperature down.  Do not place pillow under knee, focus on keeping the knee STRAIGHT while resting  DIET You may resume your previous home diet once your are discharged from the hospital.  DRESSING / WOUND CARE / SHOWERING Please remove provena negative pressure dressing on 12/07/2018 and apply honey comb dressing. Keep dressing clean and dry at all times.  ACTIVITY Walk with your walker as instructed. Use walker as long as suggested by your caregivers. Avoid periods of inactivity such as sitting longer than an hour when not asleep. This helps prevent blood clots.  You may resume a sexual relationship in one month or when given the OK by your  doctor.  You may return to work once you are cleared by your doctor.  Do not drive a car for 6 weeks or until released by you surgeon.  Do not drive while taking narcotics.  WEIGHT BEARING Weight bearing as tolerated with assist device (walker, cane, etc) as directed, use it as long as suggested by your surgeon or therapist, typically at least 4-6 weeks.  POSTOPERATIVE CONSTIPATION PROTOCOL Constipation - defined medically as fewer than three stools per week and severe constipation as less than one stool per week.  One of the most common issues patients have following surgery is constipation.  Even if you have a regular bowel pattern at home, your normal regimen is likely to be disrupted due to multiple reasons following surgery.  Combination of anesthesia, postoperative narcotics, change in appetite and fluid intake all can affect your bowels.  In order to avoid complications following surgery, here are some recommendations in order to help you during your recovery period.  Colace (docusate) - Pick up an over-the-counter form of Colace or another stool softener and take twice a day as long as you are requiring postoperative pain medications.  Take with a full glass of water daily.  If you experience loose stools or diarrhea, hold the colace until you stool forms back up.  If your symptoms do not get better within 1 week or if they get worse, check with your doctor.  Dulcolax (bisacodyl) - Pick up over-the-counter and take as directed by the product packaging as needed to assist with the movement of  your bowels.  Take with a full glass of water.  Use this product as needed if not relieved by Colace only.   MiraLax (polyethylene glycol) - Pick up over-the-counter to have on hand.  MiraLax is a solution that will increase the amount of water in your bowels to assist with bowel movements.  Take as directed and can mix with a glass of water, juice, soda, coffee, or tea.  Take if you go more than two  days without a movement. Do not use MiraLax more than once per day. Call your doctor if you are still constipated or irregular after using this medication for 7 days in a row.  If you continue to have problems with postoperative constipation, please contact the office for further assistance and recommendations.  If you experience "the worst abdominal pain ever" or develop nausea or vomiting, please contact the office immediatly for further recommendations for treatment.  ITCHING  If you experience itching with your medications, try taking only a single pain pill, or even half a pain pill at a time.  You can also use Benadryl over the counter for itching or also to help with sleep.   TED HOSE STOCKINGS Wear the elastic stockings on both legs for six weeks following surgery during the day but you may remove then at night for sleeping.  MEDICATIONS See your medication summary on the After Visit Summary that the nursing staff will review with you prior to discharge.  You may have some home medications which will be placed on hold until you complete the course of blood thinner medication.  It is important for you to complete the blood thinner medication as prescribed by your surgeon.  Continue your approved medications as instructed at time of discharge.  PRECAUTIONS If you experience chest pain or shortness of breath - call 911 immediately for transfer to the hospital emergency department.  If you develop a fever greater that 101 F, purulent drainage from wound, increased redness or drainage from wound, foul odor from the wound/dressing, or calf pain - CONTACT YOUR SURGEON.                                                   FOLLOW-UP APPOINTMENTS Make sure you keep all of your appointments after your operation with your surgeon and caregivers. You should call the office at the above phone number and make an appointment for approximately two weeks after the date of your surgery or on the date  instructed by your surgeon outlined in the "After Visit Summary".   RANGE OF MOTION AND STRENGTHENING EXERCISES  Rehabilitation of the knee is important following a knee injury or an operation. After just a few days of immobilization, the muscles of the thigh which control the knee become weakened and shrink (atrophy). Knee exercises are designed to build up the tone and strength of the thigh muscles and to improve knee motion. Often times heat used for twenty to thirty minutes before working out will loosen up your tissues and help with improving the range of motion but do not use heat for the first two weeks following surgery. These exercises can be done on a training (exercise) mat, on the floor, on a table or on a bed. Use what ever works the best and is most comfortable for you Knee exercises include:  Leg Lifts -  While your knee is still immobilized in a splint or cast, you can do straight leg raises. Lift the leg to 60 degrees, hold for 3 sec, and slowly lower the leg. Repeat 10-20 times 2-3 times daily. Perform this exercise against resistance later as your knee gets better.  Quad and Hamstring Sets - Tighten up the muscle on the front of the thigh (Quad) and hold for 5-10 sec. Repeat this 10-20 times hourly. Hamstring sets are done by pushing the foot backward against an object and holding for 5-10 sec. Repeat as with quad sets.   Leg Slides: Lying on your back, slowly slide your foot toward your buttocks, bending your knee up off the floor (only go as far as is comfortable). Then slowly slide your foot back down until your leg is flat on the floor again.  Angel Wings: Lying on your back spread your legs to the side as far apart as you can without causing discomfort.  A rehabilitation program following serious knee injuries can speed recovery and prevent re-injury in the future due to weakened muscles. Contact your doctor or a physical therapist for more information on knee rehabilitation.   IF  YOU ARE TRANSFERRED TO A SKILLED REHAB FACILITY If the patient is transferred to a skilled rehab facility following release from the hospital, a list of the current medications will be sent to the facility for the patient to continue.  When discharged from the skilled rehab facility, please have the facility set up the patient's [place] prior to being released. Also, the skilled facility will be responsible for providing the patient with their medications at time of release from the facility to include their pain medication, the muscle relaxants, and their blood thinner medication. If the patient is still at the rehab facility at time of the two week follow up appointment, the skilled rehab facility will also need to assist the patient in arranging follow up appointment in our office and any transportation needs.  MAKE SURE YOU:  Understand these instructions.  Get help right away if you are not doing well or get worse.

## 2018-11-30 NOTE — Progress Notes (Signed)
   Subjective: 3 Days Post-Op Procedure(s) (LRB): TOTAL KNEE REVISION REIMPLANTATION OF LEFT TOTAL KNEE (Left) Patient reports pain as mild, improving. Denies any chest pain shortness of breath abdominal pain nausea or vomiting.  He did have a bowel movement yesterday.    Objective: Vital signs in last 24 hours: Temp:  [97.6 F (36.4 C)-97.7 F (36.5 C)] 97.6 F (36.4 C) (07/31 0828) Pulse Rate:  [93-101] 96 (07/31 0828) Resp:  [15-17] 17 (07/31 0828) BP: (97-148)/(75-82) 148/82 (07/31 0828) SpO2:  [87 %-96 %] 93 % (07/31 0828)  Intake/Output from previous day: 07/30 0701 - 07/31 0700 In: 240 [P.O.:240] Out: 1250 [Urine:1200; Drains:50] Intake/Output this shift: No intake/output data recorded.  Recent Labs    11/27/18 1437 11/28/18 0340 11/30/18 0416  HGB 13.2 12.5* 11.3*   Recent Labs    11/28/18 0340 11/30/18 0416  WBC 12.4* 8.2  RBC 4.24 3.82*  HCT 38.7* 33.4*  PLT 255 245   Recent Labs    11/29/18 0403 11/30/18 0416  NA 134* 135  K 3.3* 3.8  CL 97* 98  CO2 28 28  BUN 10 8  CREATININE 0.80 0.78  GLUCOSE 123* 132*  CALCIUM 8.5* 9.2   No results for input(s): LABPT, INR in the last 72 hours.  EXAM General - Patient is Alert, Appropriate and Oriented Extremity - Neurovascular intact Sensation intact distally Intact pulses distally Dorsiflexion/Plantar flexion intact No cellulitis present Compartment soft Dressing - dressing C/D/I and Praveena intact, 50 cc of serosanguineous output Motor Function - intact, moving foot and toes well on exam.   Past Medical History:  Diagnosis Date  . Anxiety   . Arthritis    knees, hands  . Bipolar disorder ([place])   . Chronic kidney disease    arf 2014 due to dehydration  . Complication of anesthesia    woke up during last knee surgery feb 2020  . GERD (gastroesophageal reflux disease)    rare no meds  . Headache   . Hemorrhoids   . Hyperlipidemia   . Hypertension   . LFT elevation   . NAFLD  (nonalcoholic fatty liver disease)     Assessment/Plan:   3 Days Post-Op Procedure(s) (LRB): TOTAL KNEE REVISION REIMPLANTATION OF LEFT TOTAL KNEE (Left) Active Problems:   S/P revision of total knee, left   Status post revision of total knee  Estimated body mass index is 40.78 kg/m as calculated from the following:   Height as of this encounter: 6\' 3"  (1.905 m).   Weight as of this encounter: 148 kg. Advance diet Up with therapy  Patient doing well.  Labs and vital signs are stable.  Discharge to home with home health PT with antibiotics per infectious disease recommendations.  Follow-up with ID in 3 weeks.    DVT Prophylaxis - Lovenox, Foot Pumps and TED hose Weight-Bearing as tolerated to left leg   T. Rachelle Hora, PA-C [place] 11/30/2018, 12:13 PM

## 2018-11-30 NOTE — Progress Notes (Signed)
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  Pt is now s/p total knee revision reimplantation of left TKA.  PMH includes anxiety, bipolar disorder, CKD, and HTN.    PT Comments    Pt continues to show improvement with ambulation and independent mobility.  Issues remain with AROM against gravity knee extension, even with AAROM pain was too limiting.  Pt was able to walk 200 ft and negotiate up/down steps with improved cadence and posture, though still needing cuing and encouragement.    Follow Up Recommendations  Home health PT     Equipment Recommendations  None recommended by PT    Recommendations for Other Services       Precautions / Restrictions Precautions Precautions: Knee Restrictions Weight Bearing Restrictions: Yes LLE Weight Bearing: Weight bearing as tolerated    Mobility  Bed Mobility Overal bed mobility: Modified Independent                Transfers Overall transfer level: Modified independent Equipment used: Rolling walker (2 wheeled) Transfers: Sit to/from Stand Sit to Stand: From elevated surface;Supervision         General transfer comment: Pt able to rise w/o assist, did begin to sit before putting L leg out and not putting UEs back and came down relatively hard  Ambulation/Gait Ambulation/Gait assistance: Min guard Gait Distance (Feet): 200 Feet Assistive device: Rolling walker (2 wheeled)       General Gait Details: Pt was able to again do prolonged bout of ambulation.  Less reliant on UEs, but still requiring significant WBing through UEs.  Better able to maintain more upright posture, straighter L knee during stance phase and better overall cadence (though still inconsistent).  No LOBs or safety  issues   Stairs Stairs: Yes Stairs assistance: Supervision Stair Management: Two rails;Step to pattern Number of Stairs: 4 General stair comments: Pt did not need cuing on appropriate strategy (remembered from previous surgeries/training) able to negotiate up/down w/o phyiscal assist   Wheelchair Mobility    Modified Rankin (Stroke Patients Only)       Balance Overall balance assessment: Independent   Sitting balance-Leahy Scale: Normal       Standing balance-Leahy Scale: Good                              Cognition Arousal/Alertness: Awake/alert Behavior During Therapy: WFL for tasks assessed/performed;Impulsive Overall Cognitive Status: Within Functional Limits for tasks assessed                                        Exercises Total Joint Exercises Quad Sets: Strengthening;20 reps Short Arc Quad: AAROM;5 reps(still too painful to tolerate much) Heel Slides: Strengthening;10 reps Hip ABduction/ADduction: Strengthening;15 reps Long Arc Quad: AAROM;5 reps Knee Flexion: PROM;5 reps Goniometric ROM: 2-91 Marching in Standing: Seated;Strengthening;10 reps    General Comments        Pertinent Vitals/Pain Pain Assessment: 0-10 Pain Score: 7  Pain Location: L knee, more medially joint line pain    Home Living  Prior Function            PT Goals (current goals can now be found in the care plan section) Progress towards PT goals: Progressing toward goals    Frequency    BID      PT Plan Current plan remains appropriate    Co-evaluation              AM-PAC PT "6 Clicks" Mobility   Outcome Measure  Help needed turning from your back to your side while in a flat bed without using bedrails?: None Help needed moving from lying on your back to sitting on the side of a flat bed without using bedrails?: None Help needed moving to and from a bed to a chair (including a wheelchair)?:  None Help needed standing up from a chair using your arms (e.g., wheelchair or bedside chair)?: None Help needed to walk in hospital room?: A Little Help needed climbing 3-5 steps with a railing? : A Little 6 Click Score: 22    End of Session Equipment Utilized During Treatment: Gait belt Activity Tolerance: Patient tolerated treatment well Patient left: with call bell/phone within reach;with bed alarm set   PT Visit Diagnosis: Muscle weakness (generalized) (M62.81);Other abnormalities of gait and mobility (R26.89)     Time: 5449-2010 PT Time Calculation (min) (ACUTE ONLY): 34 min  Charges:  $Gait Training: 8-22 mins $Therapeutic Exercise: 8-22 mins                      Kreg Shropshire, DPT 11/30/2018, 12:28 PM

## 2018-11-30 NOTE — TOC Transition Note (Signed)
Transition of Care [facility]) - CM/SW Discharge Note   Patient Details  Name: STARLING CHRISTOFFERSON MRN: [id number] Date of Birth: 05/31/1965  [facility] of [facility]) CM/SW Contact:  Su Hilt, RN Phone Number: 11/30/2018, 12:57 PM   Clinical Narrative:    [facility] care to be seen Monday, he was provided with additional canisters for the Prevena and honey comb dressings, he refuses other [facility] services   Final next level of care: Home w Home Health Services Barriers to Discharge: Barriers Resolved   Patient Goals and CMS Choice Patient states their goals for this hospitalization and ongoing recovery are:: go home CMS Medicare.gov Compare Post Acute Care list provided to:: Patient Choice offered to / list presented to : Patient  Discharge Placement                       Discharge Plan and Services   Discharge Planning Services: CM Consult Post Acute Care Choice: Home Health          DME Arranged: N/A         HH Arranged: RN [place] Agency: [place] ([facility]) Date HH Agency Contacted: 11/29/18 Time [place]: [id number] Representative spoke with at [place]: [place] ([place]) Interventions     Readmission Risk Interventions No flowsheet data found.

## 2018-12-02 ENCOUNTER — Encounter: Payer: Self-pay | Admitting: Emergency Medicine

## 2018-12-02 ENCOUNTER — Emergency Department
Admission: EM | Admit: 2018-12-02 | Discharge: 2018-12-02 | Disposition: A | Discharge status: Home / Self Care | Payer: 59 | Attending: Emergency Medicine | Admitting: Emergency Medicine

## 2018-12-02 ENCOUNTER — Other Ambulatory Visit: Payer: Self-pay

## 2018-12-02 DIAGNOSIS — R339 Retention of urine, unspecified: Secondary | ICD-10-CM | POA: Diagnosis present

## 2018-12-02 DIAGNOSIS — Z96651 Presence of right artificial knee joint: Secondary | ICD-10-CM | POA: Insufficient documentation

## 2018-12-02 DIAGNOSIS — I129 Hypertensive chronic kidney disease with stage 1 through stage 4 chronic kidney disease, or unspecified chronic kidney disease: Secondary | ICD-10-CM | POA: Insufficient documentation

## 2018-12-02 DIAGNOSIS — Z79899 Other long term (current) drug therapy: Secondary | ICD-10-CM | POA: Insufficient documentation

## 2018-12-02 DIAGNOSIS — Z87891 Personal history of nicotine dependence: Secondary | ICD-10-CM | POA: Diagnosis not present

## 2018-12-02 DIAGNOSIS — Z96652 Presence of left artificial knee joint: Secondary | ICD-10-CM | POA: Diagnosis not present

## 2018-12-02 DIAGNOSIS — N189 Chronic kidney disease, unspecified: Secondary | ICD-10-CM | POA: Diagnosis not present

## 2018-12-02 LAB — CBC
HCT: 35.2 % — ABNORMAL LOW (ref 39.0–52.0)
Hemoglobin: 11.9 g/dL — ABNORMAL LOW (ref 13.0–17.0)
MCH: 29.5 pg (ref 26.0–34.0)
MCHC: 33.8 g/dL (ref 30.0–36.0)
MCV: 87.3 fL (ref 80.0–100.0)
Platelets: 397 10*3/uL (ref 150–400)
RBC: 4.03 MIL/uL — ABNORMAL LOW (ref 4.22–5.81)
RDW: 13.9 % (ref 11.5–15.5)
WBC: 7.3 10*3/uL (ref 4.0–10.5)
nRBC: 0 % (ref 0.0–0.2)

## 2018-12-02 LAB — BASIC METABOLIC PANEL
Anion gap: 13 (ref 5–15)
BUN: <5 mg/dL — ABNORMAL LOW (ref 6–20)
CO2: 24 mmol/L (ref 22–32)
Calcium: 9 mg/dL (ref 8.9–10.3)
Chloride: 98 mmol/L (ref 98–111)
Creatinine, Ser: 0.75 mg/dL (ref 0.61–1.24)
GFR calc Af Amer: >60 mL/min (ref >60)
GFR calc non Af Amer: >60 mL/min (ref >60)
Glucose, Bld: 129 mg/dL — ABNORMAL HIGH (ref 70–99)
Potassium: 3.1 mmol/L — ABNORMAL LOW (ref 3.5–5.1)
Sodium: 135 mmol/L (ref 135–145)

## 2018-12-02 LAB — URINALYSIS, COMPLETE (UACMP) WITH MICROSCOPIC
Bacteria, UA: NONE SEEN
Bilirubin Urine: NEGATIVE
Glucose, UA: NEGATIVE mg/dL
Hgb urine dipstick: NEGATIVE
Ketones, ur: NEGATIVE mg/dL
Leukocytes,Ua: NEGATIVE
Nitrite: NEGATIVE
Protein, ur: 30 mg/dL — AB
Specific Gravity, Urine: 1.008 (ref 1.005–1.030)
Squamous Epithelial / HPF: NONE SEEN (ref 0–5)
pH: 7 (ref 5.0–8.0)

## 2018-12-02 LAB — AEROBIC/ANAEROBIC CULTURE W GRAM STAIN (SURGICAL/DEEP WOUND): Culture: NO GROWTH

## 2018-12-02 MED ORDER — LIDOCAINE HCL URETHRAL/MUCOSAL 2 % EX GEL
1.0000 "application " | Freq: Once | CUTANEOUS | Status: AC
  Administered 2018-12-02: 1 via URETHRAL
  Filled 2018-12-02: qty 10

## 2018-12-02 MED ORDER — SODIUM CHLORIDE 0.9 % IV BOLUS
1000.0000 mL | Freq: Once | INTRAVENOUS | Status: AC
  Administered 2018-12-02: 1000 mL via INTRAVENOUS

## 2018-12-02 NOTE — ED Notes (Signed)
Ortho MD set up hospital wound vac suction to drain pt wound and bandage. Pt given small wound vac when ready for DC. Wife remains at bedside. Awaiting for wound vac to drain prior to DC.

## 2018-12-02 NOTE — ED Notes (Signed)
Called supply chain to bring a filter for wound vac to L knee from surgery. MD Cinda Quest wants it changed.

## 2018-12-02 NOTE — Discharge Instructions (Addendum)
These call [facility] and arrange follow-up.  Dr. Bernardo Heater is on call Dr. Erlene Quan is there as well.  They can check on you in a few days and remove the Foley.  Please return sooner if you develop a fever increasing pain or decreasing urine output.  Please also return if your diarrhea gets any worse.  Please call Dr. Rudene Christians tomorrow.  You should see him and have him check the wound.  A let him know bout the need for more filters for your pump and the bleeding that you are having.  Currently your blood count is okay.  I discussed your care with Dr. Marry Guan who is covering for Dr. Rudene Christians today and that is what he wants to do and agree with him.

## 2018-12-02 NOTE — ED Notes (Signed)
Pt contact- Angie  [phone]

## 2018-12-02 NOTE — ED Notes (Signed)
This RN called [employer], charge of ER, to change cannister for wound vac. The one that supply chain had brought to ER is the wrong one. This RN called OR, Ortho, and PACU. PACU and Ortho stated they didn't have any. OR stated she had to ask her supervisor. Awaiting call back from OR.

## 2018-12-02 NOTE — ED Triage Notes (Addendum)
Pt arrived via POV with reports of being discharged from the hospital on Friday, pt states he was able to urinate while he was in the hospital and and after he was discharged.  Pt states on Friday night, he started having difficulty urinating. Pt states he was able to dribble urine yesterday.  Pt states he has had difficulty with BM. Pt states he has bee able to drink water and thinks he is dehydrated.   Pt states he has hx of acute renal failure.

## 2018-12-02 NOTE — ED Notes (Signed)
Pt states he feels a sensation of having to urinate at this time, pt taken to bathroom and instructed to urinate in urinal to measure output.

## 2018-12-02 NOTE — ED Notes (Signed)
First Nurse Note: Pt states that he was in the hospital recently for left knee replacement. Pt states that he was released on Friday. Pt states that he was able to pass some urine and stool on Friday but has had nothing pass since then. Pt states that he has had issues in the past with ARF. Pt is in NAD.

## 2018-12-02 NOTE — ED Notes (Signed)
Ortho at bedside.

## 2018-12-02 NOTE — ED Notes (Signed)
Pt ambulated to toilet to have BM. Pt remains connected to wound vac. Wife remains at bedside.

## 2018-12-02 NOTE — ED Notes (Signed)
Pt complains of urinary retention. Pt reports Friday night he became distressed and uncomfortable trying to urinate. Reports history of acute renal failure.

## 2018-12-02 NOTE — ED Provider Notes (Addendum)
[facility] Emergency Department Provider Note   ____________________________________________   First MD Initiated Contact with Patient 12/02/18 1212     (approximate)  I have reviewed the triage vital signs and the nursing notes.   HISTORY  Chief Complaint Urinary Retention    HPI Nathan Chambers is a 53 y.o. male patient had knee surgery last week.  He reports he was urinating and stooling well in the hospital but when he went home and eventually slow down till today he was unable to go.  When he pushed himself very hard and bear down very hard he was able to produce 200 out of the 400 cc of urine in his bladder but he pushed so hard he actually stooled as well.  He still feels some discomfort.  Additionally his knee surgical wound has been bleeding and he is used up all of the filters on his wound VAC which is automatic.  His wound VAC is been beeping.         Past Medical History:  Diagnosis Date  . Anxiety   . Arthritis    knees, hands  . Bipolar disorder ([place])   . Chronic kidney disease    arf 2014 due to dehydration  . Complication of anesthesia    woke up during last knee surgery feb 2020  . GERD (gastroesophageal reflux disease)    rare no meds  . Headache   . Hemorrhoids   . Hyperlipidemia   . Hypertension   . LFT elevation   . NAFLD (nonalcoholic fatty liver disease)     Patient Active Problem List   Diagnosis Date Noted  . S/P revision of total knee, left 11/27/2018  . Status post revision of total knee 11/27/2018  . Chronic radicular lumbar pain 08/28/2018  . History of bilateral knee replacement 08/28/2018  . Lumbar radicular pain 08/28/2018  . Lumbar facet arthropathy 08/28/2018  . Spinal stenosis of lumbar region without neurogenic claudication 08/28/2018  . Lumbar spondylosis 08/28/2018  . Neuroforaminal stenosis of lumbar spine 08/28/2018  . Sensory polyneuropathy 08/28/2018  . Infection of total left knee  replacement ([place]) 06/10/2018  . Arthritis, septic, knee ([place]) 12/08/2017  . Hyperglycemia, unspecified 02/16/2016  . Anxiety 01/26/2015  . Hyperlipidemia 01/26/2015  . Hypertension 01/26/2015    Past Surgical History:  Procedure Laterality Date  . ACHILLES TENDON SURGERY Right 10/30/2015   Procedure: ACHILLES TENDON REPAIR/ HEGLAND OSTECTOMY;  Surgeon: [name], DPM;  Location: [facility] ORS;  Service: Podiatry;  Laterality: Right;  . COLONOSCOPY    . COLONOSCOPY WITH PROPOFOL N/A 04/07/2017   Procedure: COLONOSCOPY WITH PROPOFOL;  Surgeon: Toledo, Benay Pike, MD;  Location: [facility] ENDOSCOPY;  Service: Gastroenterology;  Laterality: N/A;  . FRACTURE SURGERY Left    MIDDLE FINGER  . HERNIA REPAIR    . I&D KNEE WITH POLY EXCHANGE Left 12/08/2017   Procedure: IRRIGATION AND DEBRIDEMENT KNEE WITH POLY EXCHANGE;  Surgeon: Dereck Leep, MD;  Location: [facility] ORS;  Service: Orthopedics;  Laterality: Left;  . JOINT REPLACEMENT Bilateral 2017   bilateral knees  . KNEE ARTHROTOMY Left 12/08/2017   Procedure: KNEE ARTHROTOMY, I&D, [place] OUT, POLY EXCHANGE;  Surgeon: Dereck Leep, MD;  Location: [facility] ORS;  Service: Orthopedics;  Laterality: Left;  . LIPOMA EXCISION    . TOTAL KNEE ARTHROPLASTY Bilateral 04/19/2016   Procedure: TOTAL KNEE BILATERAL;  Surgeon: Hessie Knows, MD;  Location: [facility] ORS;  Service: Orthopedics;  Laterality: Bilateral;  . TOTAL KNEE ARTHROPLASTY Left 09/11/2018  Procedure: TOTAL KNEE ARTHROPLASTY - LEFT - HARDWARE REMOVAL WITH INSERTION OF ANTIBIOTIC SPACER;  Surgeon: Hessie Knows, MD;  Location: [facility] ORS;  Service: Orthopedics;  Laterality: Left;  . TOTAL KNEE REVISION Left 06/11/2018   Procedure: INCISION AND DRAINAGE OF LEFT KNEE, POLY EXCHANGE;  Surgeon: Hessie Knows, MD;  Location: [facility] ORS;  Service: Orthopedics;  Laterality: Left;  . TOTAL KNEE REVISION Left 11/27/2018   Procedure: TOTAL KNEE REVISION REIMPLANTATION OF LEFT TOTAL KNEE;  Surgeon: Hessie Knows, MD;   Location: [facility] ORS;  Service: Orthopedics;  Laterality: Left;  Marland Kitchen VASECTOMY      Prior to Admission medications   Medication Sig Start Date End Date Taking? Authorizing Provider  acetaminophen (TYLENOL) 325 MG tablet Take 1-2 tablets (325-650 mg total) by mouth every 6 (six) hours as needed for mild pain (pain score 1-3 or temp > 100.5). 09/13/18   Duanne Guess, PA-C  amitriptyline (ELAVIL) 50 MG tablet Take 50 mg by mouth at bedtime.    [provider]  amLODipine-benazepril (LOTREL) 10-40 MG capsule Take 1 capsule by mouth every morning.  12/06/17   [provider]  aspirin EC 81 MG tablet Take 81 mg by mouth daily.    [provider]  busPIRone (BUSPAR) 5 MG tablet Take 10 mg by mouth 2 (two) times daily.     [provider]  cephALEXin (KEFLEX) 500 MG capsule Take 1 capsule (500 mg total) by mouth 4 (four) times daily for 28 days. 11/30/18 12/28/18  Duanne Guess, PA-C  chlorthalidone (HYGROTON) 25 MG tablet Take 12.5 mg by mouth daily. 11/04/17   [provider]  docusate sodium (COLACE) 100 MG capsule Take 1 capsule (100 mg total) by mouth 2 (two) times daily. 09/13/18   Duanne Guess, PA-C  doxepin (SINEQUAN) 10 MG capsule Take 20 mg by mouth at bedtime.    [provider]  DULoxetine (CYMBALTA) 60 MG capsule Take 60 mg by mouth every morning.  05/31/18   [provider]  enoxaparin (LOVENOX) 40 MG/0.4ML injection Inject 0.4 mLs (40 mg total) into the skin daily for 14 days. Patient not taking: Reported on 11/14/2018 09/14/18 09/28/18  Duanne Guess, PA-C  enoxaparin (LOVENOX) 40 MG/0.4ML injection Inject 0.4 mLs (40 mg total) into the skin daily for 14 days. 11/30/18 12/14/18  Duanne Guess, PA-C  folic acid (FOLVITE) 035 MCG tablet Take 800 mcg by mouth daily.     [provider]  gemfibrozil (LOPID) 600 MG tablet Take 600 mg by mouth 2 (two) times daily before a meal.    [provider]  meloxicam (MOBIC)  15 MG tablet Take 15 mg by mouth daily.    [provider]  Multiple Vitamins-Minerals (MULTIVITAMIN WITH MINERALS) tablet Take 1 tablet by mouth daily.    [provider]  multivitamin-lutein (OCUVITE-LUTEIN) CAPS capsule Take 1 capsule by mouth daily.    [provider]  naloxone (NARCAN) 0.4 MG/ML injection Inject 1 mL (0.4 mg total) into the vein as needed (respiratory rate < 10). Patient not taking: Reported on 11/27/2018 04/22/16   Duanne Guess, PA-C  oxyCODONE (OXYCONTIN) 15 mg 12 hr tablet Take 1 tablet (15 mg total) by mouth every 12 (twelve) hours for 10 days. 11/30/18 12/10/18  Duanne Guess, PA-C  oxyCODONE 10 MG TABS Take 1-1.5 tablets (10-15 mg total) by mouth every 4 (four) hours as needed for severe pain (pain score 7-10). 11/30/18   Duanne Guess, PA-C  PARoxetine (PAXIL) 20 MG tablet Take 40 mg by mouth daily.     [provider]  pravastatin (PRAVACHOL) 20 MG tablet Take 20 mg by mouth every evening.     [provider]  rifampin (RIFADIN) 300 MG capsule Take 1 capsule (300 mg total) by mouth every 12 (twelve) hours for 28 days. 11/30/18 12/28/18  Duanne Guess, PA-C  vitamin B-12 (CYANOCOBALAMIN) 500 MCG tablet Take 250 mcg by mouth daily.     [provider]  vitamin C (ASCORBIC ACID) 500 MG tablet Take 500 mg by mouth daily.    [provider]    Allergies Etodolac  History reviewed. No pertinent family history.  Social History Social History   Tobacco Use  . Smoking status: Never Smoker  . Smokeless tobacco: Former Systems developer    Types: Chew  Substance Use Topics  . Alcohol use: No  . Drug use: No    Review of Systems  Constitutional: No fever/chills Eyes: No visual changes. ENT: No sore throat. Cardiovascular: Denies chest pain. Respiratory: Denies shortness of breath. Gastrointestinal: Intermittent lower abdominal pain.  No nausea, no vomiting.  No diarrhea.  No constipation. Genitourinary:  Negative for dysuria. Musculoskeletal: Negative for back pain. Skin: Negative for rash. Neurological: Negative for headaches, focal weakness ____________________________________________   PHYSICAL EXAM:  VITAL SIGNS: ED Triage Vitals  Enc Vitals Group     BP 12/02/18 0958 125/80     Pulse Rate 12/02/18 0958 92     Resp 12/02/18 0958 18     Temp 12/02/18 0958 99.2 F (37.3 C)     Temp Source 12/02/18 0958 Oral     SpO2 12/02/18 0958 98 %     Weight 12/02/18 0959 (!) 327 lb (148.3 kg)     Height 12/02/18 0959 6\' 2"  (1.88 m)     Head Circumference --      Peak Flow --      Pain Score 12/02/18 1005 0     Pain Loc --      Pain Edu? --      Excl. in [place]? --     Constitutional: Alert and oriented. Well appearing and in no acute distress. Eyes: Conjunctivae are normal.  Head: Atraumatic. Nose: No congestion/rhinnorhea. Mouth/Throat: Mucous membranes are moist.  Oropharynx non-erythematous. Neck: No stridor. Cardiovascular: Normal rate, regular rhythm. Grossly normal heart sounds.  Good peripheral circulation. Respiratory: Normal respiratory effort.  No retractions. Lungs CTAB. Gastrointestinal: Soft and nontender. No distention. No abdominal bruits. No CVA tenderness. Musculoskeletal: No lower extremity tenderness nor edema.  Neurologic:  Normal speech and language. No gross focal neurologic deficits are appreciated.  Skin:  Skin is warm, dry and intact. No rash noted.  ____________________________________________   LABS (all labs ordered are listed, but only abnormal results are displayed)  Labs Reviewed  URINALYSIS, COMPLETE (UACMP) WITH MICROSCOPIC - Abnormal; Notable for the following components:      Result Value   Color, Urine YELLOW (*)    APPearance CLEAR (*)    Protein, ur 30 (*)    All other components within normal limits  BASIC METABOLIC PANEL - Abnormal; Notable for the following components:   Potassium 3.1 (*)    Glucose, Bld 129 (*)    BUN <5 (*)    All  other components within normal limits  CBC - Abnormal; Notable for the following components:   RBC 4.03 (*)    Hemoglobin 11.9 (*)    HCT 35.2 (*)  All other components within normal limits   ____________________________________________  EKG   ____________________________________________  RADIOLOGY  ED MD interpretation:    Official radiology report(s): No results found.  ____________________________________________   PROCEDURES  Procedure(s) performed (including Critical Care): I spent at least 2 hours caring for this patient helping to try to arrange the wound VAC fix.  Procedures   ____________________________________________   INITIAL IMPRESSION / ASSESSMENT AND PLAN / ED COURSE  Patient is very worried as he has had renal failure in the past.  We put the Foley in and his discomfort resolved he put out about 400 cc of urine.  I have given him some fluids just in case.  We are in the meantime working on trying to get his wound VAC alters for him.  His H&H is actually gone up slightly since his last visit.  I discussed him with Dr. Gaspar Bidding who is on-call for Dr. Rudene Christians.  That is what he recommends.  If we can get more wound VAC filters we will have the patient follow-up with Dr. Rudene Christians tomorrow on the phone.     Discussed patient with Dr. Marry Guan now.  Dr. Marry Guan is actually covering for Dr. Rudene Christians.  He recommends if we cannot find any cartridges for this wound VAC to just take it off and put a dressing on it and see Dr. Rudene Christians tomorrow.   ----------------------------------------- 4:16 PM on 12/02/2018 ----------------------------------------- Dr. Gaspar Bidding comes in.  He is able to find the adapter for the patient's wound VAC and will hook it up to the hospital wound VAC sucked out the blood and the patient's own wound VAC backup.  This should allow the patient to be good till tomorrow when he should go to see Dr. Rudene Christians in the office.  ----------------------------------------- 4:19  PM on 12/02/2018 -----------------------------------------  Patient has not had any diarrhea here in the emergency room.  Patient reports that the stool he had when he was trying to push out urine was actually formed.     ____________________________________________   FINAL CLINICAL IMPRESSION(S) / ED DIAGNOSES  Final diagnoses:  Urinary retention   Other diagnosis which is not in the computer surgical wound hematoma with complication of wound [facility]  ED Discharge Orders    None       Note:  This document was prepared using Dragon voice recognition software and may include unintentional dictation errors.    Nena Polio, MD 12/02/18 1439    Nena Polio, MD 12/02/18 1539    Nena Polio, MD 12/02/18 9211    Nena Polio, MD 12/02/18 [phone]

## 2018-12-03 ENCOUNTER — Encounter: Payer: Self-pay | Admitting: Orthopedic Surgery

## 2018-12-12 ENCOUNTER — Ambulatory Visit: Payer: Self-pay | Admitting: Urology

## 2018-12-12 ENCOUNTER — Other Ambulatory Visit: Payer: Self-pay

## 2018-12-12 ENCOUNTER — Ambulatory Visit: Payer: 59 | Admitting: Urology

## 2018-12-12 ENCOUNTER — Encounter: Payer: Self-pay | Admitting: Urology

## 2018-12-12 VITALS — BP 84/53 | HR 100

## 2018-12-12 DIAGNOSIS — R339 Retention of urine, unspecified: Secondary | ICD-10-CM

## 2018-12-12 NOTE — Progress Notes (Signed)
Fill and Pull Catheter Removal  Patient is present today for a catheter removal.  Patient was cleaned and prepped in a sterile fashion 224ml of sterile water/ saline was instilled into the bladder when the patient felt the urge to urinate. 77ml of water was then drained from the balloon.  A 16FR foley cath was removed from the bladder no complications were noted .  Patient as then given some time to void on their own.  Patient can void  238ml on their own after some time.  Patient tolerated well.  Preformed by: Fonnie Jarvis, CMA

## 2018-12-12 NOTE — Progress Notes (Signed)
12/12/18 8:42 AM   Nathan Chambers 1966/04/26 [id number]  Referring provider: Juluis Pitch, MD [place],  [place] [address]  CC: Urinary retention  HPI: I saw [name] in urology clinic today for evaluation of urinary retention.  He is a 53 year old male with obesity and a number of co-morbidities who recently underwent knee surgery with Dr. Rudene Christians on 11/27/2018.  He was discharged home, however on postop day 3 he returned to the ER with difficulty urinating and difficulty having a bowel movement.  From reviewing the notes, bladder scan was 400 cc and he was able to void 200 cc, however a catheter was placed at that time with return of clear yellow urine.  He denies any urinary symptoms prior to his knee surgery surgery.  On chart review, PSAs have all been normal and less than 0.5.  He denies any history of gross hematuria.  There are no aggravating or alleviating factors.  Severity is moderate.  He remains on Keflex after his knee surgery.  PMH: Past Medical History:  Diagnosis Date  . Anxiety   . Arthritis    knees, hands  . Bipolar disorder ([place])   . Chronic kidney disease    arf 2014 due to dehydration  . Complication of anesthesia    woke up during last knee surgery feb 2020  . GERD (gastroesophageal reflux disease)    rare no meds  . Headache   . Hemorrhoids   . Hyperlipidemia   . Hypertension   . LFT elevation   . NAFLD (nonalcoholic fatty liver disease)     Surgical History: Past Surgical History:  Procedure Laterality Date  . ACHILLES TENDON SURGERY Right 10/30/2015   Procedure: ACHILLES TENDON REPAIR/ HEGLAND OSTECTOMY;  Surgeon: [name], DPM;  Location: [facility] OR[facility];  Service: Podiatry;  Laterality: Right;  . COLONOSCOPY    . COLONOSCOPY WITH PROPOFOL N/A 04/07/2017   Procedure: COLONOSCOPY WITH PROPOFOL;  Surgeon: Toledo, Benay Pike, MD;  Location: [facility];  Service: Gastroenterology;  Laterality: N/A;  . FRACTURE  SURGERY Left    MIDDLE FINGER  . HERNIA REPAIR    . I&D KNEE WITH POLY EXCHANGE Left 12/08/2017   Procedure: IRRIGATION AND DEBRIDEMENT KNEE WITH POLY EXCHANGE;  Surgeon: Dereck Leep, MD;  Location: [facility] OR[facility];  Service: Orthopedics;  Laterality: Left;  . JOINT REPLACEMENT Bilateral 2017   bilateral knees  . KNEE ARTHROTOMY Left 12/08/2017   Procedure: KNEE ARTHROTOMY, I&D, [place] OUT, POLY EXCHANGE;  Surgeon: Dereck Leep, MD;  Location: [facility];  Service: Orthopedics;  Laterality: Left;  . LIPOMA EXCISION    . TOTAL KNEE ARTHROPLASTY Bilateral 04/19/2016   Procedure: TOTAL KNEE BILATERAL;  Surgeon: Hessie Knows, MD;  Location: [facility];  Service: Orthopedics;  Laterality: Bilateral;  . TOTAL KNEE ARTHROPLASTY Left 09/11/2018   Procedure: TOTAL KNEE ARTHROPLASTY - LEFT - HARDWARE REMOVAL WITH INSERTION OF ANTIBIOTIC SPACER;  Surgeon: Hessie Knows, MD;  Location: [facility];  Service: Orthopedics;  Laterality: Left;  . TOTAL KNEE REVISION Left 06/11/2018   Procedure: INCISION AND DRAINAGE OF LEFT KNEE, POLY EXCHANGE;  Surgeon: Hessie Knows, MD;  Location: [facility];  Service: Orthopedics;  Laterality: Left;  . TOTAL KNEE REVISION Left 11/27/2018   Procedure: TOTAL KNEE REVISION REIMPLANTATION OF LEFT TOTAL KNEE;  Surgeon: Hessie Knows, MD;  Location: [facility];  Service: Orthopedics;  Laterality: Left;  Marland Kitchen VASECTOMY      Allergies:  Allergies  Allergen Reactions  .  Etodolac Itching    Family History: No family history on file.  Social History:  reports that he has never smoked. He quit smokeless tobacco use about 7 years ago.  His smokeless tobacco use included chew. He reports that he does not drink alcohol or use drugs.  ROS: Please see flowsheet from today's date for complete review of systems.  Physical Exam: There were no vitals taken for this visit.   Constitutional: Obese, in wheelchair Cardiovascular: No clubbing, cyanosis, or edema. Respiratory: Normal respiratory effort,  no increased work of breathing. GI: Abdomen is soft, nontender, nondistended, no abdominal masses GU: No CVA tenderness Lymph: No cervical or inguinal lymphadenopathy. Skin: No rashes, bruises or suspicious lesions. Neurologic: Grossly intact, no focal deficits, moving all 4 extremities. Psychiatric: Normal mood and affect.  Laboratory Data: Reviewed  Foley catheter was removed in clinic today, the patient was able to void 200 cc yellow urine with a strong stream and PVR of 0 cc.  Assessment & Plan:   In summary, the patient is a co-morbid 53 year old male who developed constipation and difficulty urinating postop day 3 after knee surgery.  A catheter was placed at that time for an elevated postvoid residual of 200 cc.  He was able to void in clinic with a strong stream today, and emptied well.  He denies any urinary symptoms prior to undergoing surgery.  We discussed at length the role of anesthesia, narcotics, pain, and constipation on urinary symptoms.  I recommended he continue adequate hydration, minimize narcotics, take stool softeners as needed.  We are happy to see him again in the future if he develops any recurrent urinary symptoms.  Follow-up as needed   Billey Co, MD  [place] [address], [place], [place] [address] [phone]

## 2018-12-19 ENCOUNTER — Telehealth: Payer: Self-pay | Admitting: Student in an Organized Health Care Education/Training Program

## 2018-12-19 NOTE — Telephone Encounter (Signed)
Patient lvmail at 2:29 saying he is having severe pain in lower back since procedure, barely able to sit. I called to schedule him a VV with you. Patient does not want a Virtual Visit. He wants to come in person to speak with you. Is it ok to schedule an in clinic appt?

## 2018-12-20 ENCOUNTER — Ambulatory Visit: Payer: 59 | Attending: Infectious Diseases | Admitting: Infectious Diseases

## 2018-12-20 ENCOUNTER — Other Ambulatory Visit
Admission: RE | Admit: 2018-12-20 | Discharge: 2018-12-20 | Disposition: A | Discharge status: Home / Self Care | Payer: 59 | Attending: Infectious Diseases | Admitting: Infectious Diseases

## 2018-12-20 ENCOUNTER — Encounter: Payer: Self-pay | Admitting: Infectious Diseases

## 2018-12-20 ENCOUNTER — Other Ambulatory Visit: Payer: Self-pay

## 2018-12-20 VITALS — BP 126/75 | HR 96 | Temp 98.9°F

## 2018-12-20 DIAGNOSIS — Z79899 Other long term (current) drug therapy: Secondary | ICD-10-CM

## 2018-12-20 DIAGNOSIS — F419 Anxiety disorder, unspecified: Secondary | ICD-10-CM

## 2018-12-20 DIAGNOSIS — I10 Essential (primary) hypertension: Secondary | ICD-10-CM | POA: Diagnosis not present

## 2018-12-20 DIAGNOSIS — T8454XD Infection and inflammatory reaction due to internal left knee prosthesis, subsequent encounter: Secondary | ICD-10-CM | POA: Diagnosis not present

## 2018-12-20 DIAGNOSIS — Z888 Allergy status to other drugs, medicaments and biological substances status: Secondary | ICD-10-CM

## 2018-12-20 DIAGNOSIS — B9561 Methicillin susceptible Staphylococcus aureus infection as the cause of diseases classified elsewhere: Secondary | ICD-10-CM | POA: Diagnosis not present

## 2018-12-20 DIAGNOSIS — M00062 Staphylococcal arthritis, left knee: Secondary | ICD-10-CM

## 2018-12-20 DIAGNOSIS — T8450XD Infection and inflammatory reaction due to unspecified internal joint prosthesis, subsequent encounter: Secondary | ICD-10-CM | POA: Insufficient documentation

## 2018-12-20 DIAGNOSIS — Z87891 Personal history of nicotine dependence: Secondary | ICD-10-CM

## 2018-12-20 DIAGNOSIS — F319 Bipolar disorder, unspecified: Secondary | ICD-10-CM

## 2018-12-20 DIAGNOSIS — E785 Hyperlipidemia, unspecified: Secondary | ICD-10-CM

## 2018-12-20 DIAGNOSIS — Z96653 Presence of artificial knee joint, bilateral: Secondary | ICD-10-CM

## 2018-12-20 LAB — CBC WITH DIFFERENTIAL/PLATELET
Abs Immature Granulocytes: 0.02 10*3/uL (ref 0.00–0.07)
Basophils Absolute: 0.1 10*3/uL (ref 0.0–0.1)
Basophils Relative: 1 %
Eosinophils Absolute: 0.9 10*3/uL — ABNORMAL HIGH (ref 0.0–0.5)
Eosinophils Relative: 10 %
HCT: 40.7 % (ref 39.0–52.0)
Hemoglobin: 13.2 g/dL (ref 13.0–17.0)
Immature Granulocytes: 0 %
Lymphocytes Relative: 18 %
Lymphs Abs: 1.7 10*3/uL (ref 0.7–4.0)
MCH: 28.3 pg (ref 26.0–34.0)
MCHC: 32.4 g/dL (ref 30.0–36.0)
MCV: 87.3 fL (ref 80.0–100.0)
Monocytes Absolute: 0.6 10*3/uL (ref 0.1–1.0)
Monocytes Relative: 7 %
Neutro Abs: 5.9 10*3/uL (ref 1.7–7.7)
Neutrophils Relative %: 64 %
Platelets: 457 10*3/uL — ABNORMAL HIGH (ref 150–400)
RBC: 4.66 MIL/uL (ref 4.22–5.81)
RDW: 12.5 % (ref 11.5–15.5)
WBC: 9.2 10*3/uL (ref 4.0–10.5)
nRBC: 0 % (ref 0.0–0.2)

## 2018-12-20 LAB — COMPREHENSIVE METABOLIC PANEL
ALT: 44 U/L (ref 0–44)
AST: 43 U/L — ABNORMAL HIGH (ref 15–41)
Albumin: 3.9 g/dL (ref 3.5–5.0)
Alkaline Phosphatase: 112 U/L (ref 38–126)
Anion gap: 10 (ref 5–15)
BUN: 10 mg/dL (ref 6–20)
CO2: 24 mmol/L (ref 22–32)
Calcium: 9.4 mg/dL (ref 8.9–10.3)
Chloride: 102 mmol/L (ref 98–111)
Creatinine, Ser: 0.82 mg/dL (ref 0.61–1.24)
GFR calc Af Amer: >60 mL/min (ref >60)
GFR calc non Af Amer: >60 mL/min (ref >60)
Glucose, Bld: 154 mg/dL — ABNORMAL HIGH (ref 70–99)
Potassium: 3.8 mmol/L (ref 3.5–5.1)
Sodium: 136 mmol/L (ref 135–145)
Total Bilirubin: 0.5 mg/dL (ref 0.3–1.2)
Total Protein: 7.6 g/dL (ref 6.5–8.1)

## 2018-12-20 LAB — CK: Total CK: 108 U/L (ref 49–397)

## 2018-12-20 LAB — C-REACTIVE PROTEIN: CRP: 2.3 mg/dL — ABNORMAL HIGH (ref <1.0)

## 2018-12-20 LAB — SEDIMENTATION RATE: Sed Rate: 27 mm/hr — ABNORMAL HIGH (ref 0–20)

## 2018-12-20 NOTE — Telephone Encounter (Signed)
Called patient and informed him of this.

## 2018-12-20 NOTE — Patient Instructions (Signed)
You are here for follow up of the left knee prosthetic joint replacement- You are having serous discharge from the left knee surgical site- we will send culture, we will also do labs including cbc, CMP, Esr, CRP Continue keflex 523m every 6 hours and rifampin 3068m Every 12 hours Follow up with Dr.Menz

## 2018-12-20 NOTE — Progress Notes (Signed)
NAME: Nathan Chambers  DOB: [birth year]  MRN: [id number]  Date/Time: 12/20/2018 9:19 AM  Subjective:  Follow up after recent hospitalization for TKA  53 yr male with h/o HTN, hyperlipidemia, anxiety, bipolar disorder Was admitted between 11/27/18-11/30/18  And had total knee revision reimplantation of left knee on 11/27/18. Surgical culture negative He was discharged home on cephalexin and rifampin He is here for follow up- has some discharge from the left knee surgical site. Also feels the knee cap is moving when he extends the leg  Saw Dr.Menz last week   Patient has a complicated infectious history of the left knee.  Patient had bilateral knee replacement on 04/19/2016 by Dr. Rudene Christians and was doing okay until August 2019.  He was in [facility] between August 9 until August 13 for left knee pain and swelling following an injury sustained bya lawn mover to the left calf which had MSSA infection.  He underwent left knee arthrotomy and irrigation debridement and polyethylene exchange on 12/08/2017.  The culture from the surgery was staph aureus.  I had seen him and I treated him with 6 weeks of IV cefazolin and p.o. rifampin.  After the completion of IV he was on oral Bactrim and rifampin.  On 06/08/2018 he developed sudden onset left knee pain after missing 2 days of antibiotics.  He was admitted to the hospital and was taken for surgery and underwent I/Dof the left knee with poly-exchange and tissue culture was negative.  He was sent home on Bactrim and rifampin.On August 29, 2018 he presented to Dr. Rudene Christians office with sudden onset pain and swelling and he was given a 10-day steroid taper and asked to ice and elevate his knee.  But he did not improve and on 09/03/2018 had aspiration of the left knee and that culture came back as staph aureus and  he got admitted to the hospital and on 09/11/2018 underwent removal of the total knee arthroplasty and had an antibiotic spacer and then took 6 weeks of IV nafcillin which he  completed on 10/23/2018.  The next day he underwent aspiration of the left knee by Dr. Rudene Christians and the culture was negative.  On 11/19/2018 his ESR was 10 and WBC was 8.7.. Preop screening for MRSA nares swab was negative.  So was staph aureus.  He underwent total knee revision reimplantation of left knee on 11/27/18. OR Culture was negative. He was sent home on Po cephalexin '500mg'$  every 6 hours and rifampin '300mg'$  BID . Today he is here for follow up C/o discharge from the left knee No fever or chills No diarrhea, abdominal pain, no rash, no sob Mobility restricted- says he can feel the knee cap move on extending his leg Says he is 100% adherent with his antibiotics-no side effects Followed by Dr.Menz- saw him lat week   Past Medical History:  Diagnosis Date  . Anxiety   . Arthritis    knees, hands  . Bipolar disorder ([place])   . Chronic kidney disease    arf 2014 due to dehydration  . Complication of anesthesia    woke up during last knee surgery feb 2020  . GERD (gastroesophageal reflux disease)    rare no meds  . Headache   . Hemorrhoids   . Hyperlipidemia   . Hypertension   . LFT elevation   . NAFLD (nonalcoholic fatty liver disease)     Past Surgical History:  Procedure Laterality Date  . ACHILLES TENDON SURGERY Right 10/30/2015   Procedure: ACHILLES  TENDON REPAIR/ HEGLAND OSTECTOMY;  Surgeon: [name], DPM;  Location: [facility] OR[facility];  Service: Podiatry;  Laterality: Right;  . COLONOSCOPY    . COLONOSCOPY WITH PROPOFOL N/A 04/07/2017   Procedure: COLONOSCOPY WITH PROPOFOL;  Surgeon: Toledo, Benay Pike, MD;  Location: [facility];  Service: Gastroenterology;  Laterality: N/A;  . FRACTURE SURGERY Left    MIDDLE FINGER  . HERNIA REPAIR    . I&D KNEE WITH POLY EXCHANGE Left 12/08/2017   Procedure: IRRIGATION AND DEBRIDEMENT KNEE WITH POLY EXCHANGE;  Surgeon: Dereck Leep, MD;  Location: [facility];  Service: Orthopedics;  Laterality: Left;  . JOINT REPLACEMENT Bilateral 2017    bilateral knees  . KNEE ARTHROTOMY Left 12/08/2017   Procedure: KNEE ARTHROTOMY, I&D, [place] OUT, POLY EXCHANGE;  Surgeon: Dereck Leep, MD;  Location: [facility];  Service: Orthopedics;  Laterality: Left;  . LIPOMA EXCISION    . TOTAL KNEE ARTHROPLASTY Bilateral 04/19/2016   Procedure: TOTAL KNEE BILATERAL;  Surgeon: Hessie Knows, MD;  Location: [facility];  Service: Orthopedics;  Laterality: Bilateral;  . TOTAL KNEE ARTHROPLASTY Left 09/11/2018   Procedure: TOTAL KNEE ARTHROPLASTY - LEFT - HARDWARE REMOVAL WITH INSERTION OF ANTIBIOTIC SPACER;  Surgeon: Hessie Knows, MD;  Location: [facility];  Service: Orthopedics;  Laterality: Left;  . TOTAL KNEE REVISION Left 06/11/2018   Procedure: INCISION AND DRAINAGE OF LEFT KNEE, POLY EXCHANGE;  Surgeon: Hessie Knows, MD;  Location: [facility];  Service: Orthopedics;  Laterality: Left;  . TOTAL KNEE REVISION Left 11/27/2018   Procedure: TOTAL KNEE REVISION REIMPLANTATION OF LEFT TOTAL KNEE;  Surgeon: Hessie Knows, MD;  Location: [facility];  Service: Orthopedics;  Laterality: Left;  Marland Kitchen VASECTOMY      Social History   Socioeconomic History  . Marital status: Married    Spouse name: Not on file  . Number of children: Not on file  . Years of education: Not on file  . Highest education level: Not on file  Occupational History  . Not on file  Social Needs  . Financial resource strain: Somewhat hard  . Food insecurity    Worry: Never true    Inability: Never true  . Transportation needs    Medical: Patient refused    Non-medical: Patient refused  Tobacco Use  . Smoking status: Never Smoker  . Smokeless tobacco: Former Systems developer    Types: Chew  Substance and Sexual Activity  . Alcohol use: No  . Drug use: No  . Sexual activity: Yes  Lifestyle  . Physical activity    Days per week: 1 day    Minutes per session: 30 min  . Stress: To some extent  Relationships  . Social connections    Talks on phone: More than three times a week    Gets together: More  than three times a week    Attends religious service: Never    Active member of club or organization: No    Attends meetings of clubs or organizations: Never    Relationship status: Married  . Intimate partner violence    Fear of current or ex partner: Patient refused    Emotionally abused: Patient refused    Physically abused: Patient refused    Forced sexual activity: Patient refused  Other Topics Concern  . Not on file  Social History Narrative  . Not on file    No family history on file. Allergies  Allergen Reactions  . Etodolac Itching    ? Current Outpatient Medications  Medication Sig Dispense  Refill  . acetaminophen (TYLENOL) 325 MG tablet Take 1-2 tablets (325-650 mg total) by mouth every 6 (six) hours as needed for mild pain (pain score 1-3 or temp > 100.5).    Marland Kitchen amitriptyline (ELAVIL) 50 MG tablet Take 50 mg by mouth at bedtime.    Marland Kitchen amLODipine-benazepril (LOTREL) 10-40 MG capsule Take 1 capsule by mouth every morning.     Marland Kitchen aspirin EC 81 MG tablet Take 81 mg by mouth daily.    . busPIRone (BUSPAR) 5 MG tablet Take 10 mg by mouth 2 (two) times daily.     . cephALEXin (KEFLEX) 500 MG capsule Take 1 capsule (500 mg total) by mouth 4 (four) times daily for 28 days. 112 capsule 0  . chlorthalidone (HYGROTON) 25 MG tablet Take 12.5 mg by mouth daily.    Marland Kitchen docusate sodium (COLACE) 100 MG capsule Take 1 capsule (100 mg total) by mouth 2 (two) times daily. 10 capsule 0  . doxepin (SINEQUAN) 10 MG capsule Take 20 mg by mouth at bedtime.    . DULoxetine (CYMBALTA) 60 MG capsule Take 60 mg by mouth every morning.     . enoxaparin (LOVENOX) 40 MG/0.4ML injection Inject 0.4 mLs (40 mg total) into the skin daily for 14 days. (Patient not taking: Reported on 11/14/2018) 14 Syringe 0  . enoxaparin (LOVENOX) 40 MG/0.4ML injection Inject 0.4 mLs (40 mg total) into the skin daily for 14 days. 5.4 mL 0  . folic acid (FOLVITE) 993 MCG tablet Take 800 mcg by mouth daily.     Marland Kitchen gemfibrozil  (LOPID) 600 MG tablet Take 600 mg by mouth 2 (two) times daily before a meal.    . meloxicam (MOBIC) 15 MG tablet Take 15 mg by mouth daily.    . methocarbamol (ROBAXIN) 500 MG tablet     . Multiple Vitamins-Minerals (MULTIVITAMIN WITH MINERALS) tablet Take 1 tablet by mouth daily.    . multivitamin-lutein (OCUVITE-LUTEIN) CAPS capsule Take 1 capsule by mouth daily.    . naloxone (NARCAN) 0.4 MG/ML injection Inject 1 mL (0.4 mg total) into the vein as needed (respiratory rate < 10). 1 mL 0  . oxyCODONE 10 MG TABS Take 1-1.5 tablets (10-15 mg total) by mouth every 4 (four) hours as needed for severe pain (pain score 7-10). 30 tablet 0  . PARoxetine (PAXIL) 20 MG tablet Take 40 mg by mouth daily.     . pravastatin (PRAVACHOL) 20 MG tablet Take 20 mg by mouth every evening.     . rifampin (RIFADIN) 300 MG capsule Take 1 capsule (300 mg total) by mouth every 12 (twelve) hours for 28 days. 56 capsule 0  . vitamin B-12 (CYANOCOBALAMIN) 500 MCG tablet Take 250 mcg by mouth daily.     . vitamin C (ASCORBIC ACID) 500 MG tablet Take 500 mg by mouth daily.     No current facility-administered medications for this visit.      Abtx:  Anti-infectives (From admission, onward)   None      REVIEW OF SYSTEMS:  Const: negative fever, negative chills, negative weight loss Eyes: negative diplopia or visual changes, negative eye pain ENT: negative coryza, negative sore throat Resp: negative cough, hemoptysis, dyspnea Cards: negative for chest pain, palpitations, lower extremity edema GU: negative for frequency, dysuria and hematuria GI: Negative for abdominal pain, diarrhea, bleeding, constipation Skin: negative for rash and pruritus Heme: negative for easy bruising and gum/nose bleeding MS: as above Neurolo:negative for headaches, dizziness, vertigo, memory problems  Psych: anxious  and edgy Endocrine: negative for thyroid, diabetes issues Allergy/Immunology- as above?  Objective:  VITALS:  BP  126/75 (BP Location: Left Arm, Patient Position: Sitting, Cuff Size: Large)   Pulse 96   Temp 98.9 F (37.2 C) (Oral)  PHYSICAL EXAM:  General: Alert, cooperative, anxious,  Head: Normocephalic, without obvious abnormality, atraumatic. Eyes: Conjunctivae clear, anicteric sclerae. Pupils are equal ENT Nares normal. No drainage or sinus tenderness. Lips, mucosa, and tongue normal. No Thrush Neck: Supple, symmetrical, no adenopathy, thyroid: non tender no carotid bruit and no JVD. Back: No CVA tenderness. Lungs: Clear to auscultation bilaterally. No Wheezing or Rhonchi. No rales. Heart: Regular rate and rhythm, no murmur, rub or gallop. Abdomen: Soft, non-tender,not distended. Bowel sounds normal. No masses Extremities: left knee- no erythema- swollen, surgical scar has a pin hole opening with serous discharge Rt knee healthy scar Skin: No rashes or lesions. Or bruising, tattoos Lymph: Cervical, supraclavicular normal. Neurologic: Grossly non-focal Pertinent Labs Lab Results CBC    Component Value Date/Time   WBC 7.3 12/02/2018 1029   RBC 4.03 (L) 12/02/2018 1029   HGB 11.9 (L) 12/02/2018 1029   HGB 13.9 01/06/2013 0424   HCT 35.2 (L) 12/02/2018 1029   HCT 39.4 (L) 01/06/2013 0424   PLT 397 12/02/2018 1029   PLT 254 01/06/2013 0424   MCV 87.3 12/02/2018 1029   MCV 91 01/06/2013 0424   MCH 29.5 12/02/2018 1029   MCHC 33.8 12/02/2018 1029   RDW 13.9 12/02/2018 1029   RDW 13.4 01/06/2013 0424   LYMPHSABS 1.7 08/09/2018 1139   LYMPHSABS 2.1 01/06/2013 0424   MONOABS 0.7 08/09/2018 1139   MONOABS 0.7 01/06/2013 0424   EOSABS 0.5 08/09/2018 1139   EOSABS 0.3 01/06/2013 0424   BASOSABS 0.1 08/09/2018 1139   BASOSABS 0.1 01/06/2013 0424    CMP Latest Ref Rng & Units 12/02/2018 11/30/2018 11/29/2018  Glucose 70 - 99 mg/dL 129(H) 132(H) 123(H)  BUN 6 - 20 mg/dL <5(L) 8 10  Creatinine 0.61 - 1.24 mg/dL 0.75 0.78 0.80  Sodium 135 - 145 mmol/L 135 135 134(L)  Potassium 3.5 - 5.1  mmol/L 3.1(L) 3.8 3.3(L)  Chloride 98 - 111 mmol/L 98 98 97(L)  CO2 22 - 32 mmol/L '24 28 28  '$ Calcium 8.9 - 10.3 mg/dL 9.0 9.2 8.5(L)  Total Protein 6.5 - 8.1 g/dL - - -  Total Bilirubin 0.3 - 1.2 mg/dL - - -  Alkaline Phos 38 - 126 U/L - - -  AST 15 - 41 U/L - - -  ALT 0 - 44 U/L - - -   ESR 10 on 11/19/18  IMAGING RESULTS: I have personally reviewed the films ? Impression/Recommendation ? 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 Explantation of the prosthetic joint in May 2020 followed by 6 weeks of IV nafcillin.  Post antibiotic culture of the left knee joint was negative but it was done very soon after completion of antibiotic . ESR done a month later was 10 indicating no inflammation. He underwent total knee revision and reimplantation on 11/27/18.  synovial tissue culture was neg. Marland KitchenHe has been on keflex and rifampin since his discharge Has serous discharge- culture sent today. Will also get ESR, CRP, CBC, CMP today Continue keflex and rifampin  Rt TKA in 2017  Hypertension on amlodipine and benazepril and chlorthalidone  Hyperlipidemia on Pravachol and gemfibrozil  Anxiety depression on BuSpar, doxepin, duloxetine, paroxetine and as needed Ambien and amitriptyline at nighttime. ? __Follow  up appt wih Dr.Menz next week follow up with me 1 month _________________________________________________ Discussed with patient and communicated with Dr.Menz his orthopedic surgeon as well Note:  This document was prepared using Dragon voice recognition software and may include unintentional dictation errors.

## 2018-12-22 LAB — AEROBIC CULTURE W GRAM STAIN (SUPERFICIAL SPECIMEN): Culture: NO GROWTH

## 2018-12-24 NOTE — Progress Notes (Signed)
Patient called.

## 2018-12-26 ENCOUNTER — Ambulatory Visit: Payer: Self-pay | Admitting: Urology

## 2018-12-27 ENCOUNTER — Other Ambulatory Visit: Payer: Self-pay | Admitting: Licensed Clinical Social Worker

## 2018-12-27 ENCOUNTER — Encounter: Payer: Self-pay | Admitting: Student in an Organized Health Care Education/Training Program

## 2018-12-27 ENCOUNTER — Ambulatory Visit
Payer: 59 | Attending: Student in an Organized Health Care Education/Training Program | Admitting: Student in an Organized Health Care Education/Training Program

## 2018-12-27 ENCOUNTER — Other Ambulatory Visit: Payer: Self-pay

## 2018-12-27 VITALS — BP 154/99 | HR 96 | Temp 98.7°F | Resp 16 | Ht 74.0 in | Wt 330.0 lb

## 2018-12-27 DIAGNOSIS — G608 Other hereditary and idiopathic neuropathies: Secondary | ICD-10-CM | POA: Diagnosis not present

## 2018-12-27 DIAGNOSIS — T8454XD Infection and inflammatory reaction due to internal left knee prosthesis, subsequent encounter: Secondary | ICD-10-CM

## 2018-12-27 DIAGNOSIS — Z96653 Presence of artificial knee joint, bilateral: Secondary | ICD-10-CM | POA: Diagnosis not present

## 2018-12-27 DIAGNOSIS — G8929 Other chronic pain: Secondary | ICD-10-CM

## 2018-12-27 DIAGNOSIS — M9983 Other biomechanical lesions of lumbar region: Secondary | ICD-10-CM

## 2018-12-27 DIAGNOSIS — G894 Chronic pain syndrome: Secondary | ICD-10-CM

## 2018-12-27 DIAGNOSIS — T8450XD Infection and inflammatory reaction due to unspecified internal joint prosthesis, subsequent encounter: Secondary | ICD-10-CM

## 2018-12-27 DIAGNOSIS — M5416 Radiculopathy, lumbar region: Secondary | ICD-10-CM

## 2018-12-27 DIAGNOSIS — M47816 Spondylosis without myelopathy or radiculopathy, lumbar region: Secondary | ICD-10-CM

## 2018-12-27 DIAGNOSIS — M48061 Spinal stenosis, lumbar region without neurogenic claudication: Secondary | ICD-10-CM

## 2018-12-27 MED ORDER — RIFAMPIN 300 MG PO CAPS: 300.0000 mg | ORAL_CAPSULE | Freq: Two times a day (BID) | ORAL | 1 refills | Status: AC

## 2018-12-27 MED ORDER — CEPHALEXIN 500 MG PO CAPS: 500.0000 mg | ORAL_CAPSULE | Freq: Four times a day (QID) | ORAL | 1 refills | Status: DC

## 2018-12-27 MED ORDER — CEPHALEXIN 500 MG PO CAPS: 500.0000 mg | ORAL_CAPSULE | Freq: Four times a day (QID) | ORAL | 1 refills | Status: AC

## 2018-12-27 MED ORDER — RIFAMPIN 300 MG PO CAPS: 300.0000 mg | ORAL_CAPSULE | Freq: Two times a day (BID) | ORAL | 1 refills | Status: DC

## 2018-12-27 NOTE — Progress Notes (Signed)
Safety precautions to be maintained throughout the outpatient stay will include: orient to surroundings, keep bed in low position, maintain call bell within reach at all times, provide assistance with transfer out of bed and ambulation.  

## 2018-12-27 NOTE — Progress Notes (Signed)
Patient's Name: Nathan Chambers  MRN: [id number]  Referring Provider: Juluis Pitch, MD  DOB: 04-Apr-1966  PCP: Juluis Pitch, MD  DOS: 12/27/2018  Note by: Gillis Santa, MD  Service setting: Ambulatory outpatient  Attending: Gillis Santa, MD  Location: [facility] (AMB) [facility]  Specialty: Interventional Pain Management  Patient type: Established   CC: Back Pain (bilateral left is worse ), Foot Pain (bilateral ), Leg Pain (left ), and Knee Pain (s/p knee surgery. )  HPI: Nathan Chambers is a 53 y.o. year old, male patient, who comes today complaining of Back Pain (bilateral left is worse ), Foot Pain (bilateral ), Leg Pain (left ), and Knee Pain (s/p knee surgery. ) His last contact with [place] was on 12/19/2018. I personally saw him on 12/19/2018. Severity of the pain is described as a 7 /10. His pain is located in the area of the Leg(see visit info for additional pain sites) Left and back pain down both legs on the outside of leg. Onset: More than a month ago. Pain is descriptors include: Discomfort, Burning, Tender, Sore, Constant. Tempo is described as: Constant. Modifying factors: medications take the edge off but he does not have enough to last the month.  ROS: Positive for left knee swelling, left hip numbness and tingling.. No reported episodes of acute onset apraxia, aphasia, dysarthria, agnosia, amnesia, paralysis, loss of coordination, or loss of consciousness.  Exam: Nathan Chambers  height is 6\' 2"  (1.88 m) and weight is 330 lb (149.7 kg) (abnormal). His oral temperature is 98.7 F (37.1 C). His blood pressure is 154/99 (abnormal) and his pulse is 96. His respiration is 16 and oxygen saturation is 97%.  Body mass index is 42.37 kg/m.   Lumbar Spine Area Exam  Skin & Axial Inspection: No masses, redness, or swelling Alignment: Scoliosis detected Functional ROM: Decreased ROM affecting both sides Stability: No instability detected Muscle Tone/Strength: Functionally intact.  No obvious neuro-muscular anomalies detected. Sensory (Neurological): Dermatomal pain pattern left L3, L4 Palpation: Complains of area being tender to palpation Left Fist Percussion Test Provocative Tests: Hyperextension/rotation test: (+) bilaterally for facet joint pain. Lumbar quadrant test (Kemp's test): (+) on the left for foraminal stenosis Lateral bending test: (+) due to pain. Patrick's Maneuver: (+) for left-sided S-I arthralgia             FABER* test: (+) for left-sided S-I arthralgia             S-I anterior distraction/compression test: deferred today         S-I lateral compression test: deferred today         S-I Thigh-thrust test: deferred today         S-I Gaenslen's test: deferred today         *(Flexion, ABduction and External Rotation)  Gait & Posture Assessment  Ambulation: Limited Gait: Limited. Using assistive device to ambulate Posture: Difficulty standing up straight, due to pain   Lower Extremity Exam    Side: Right lower extremity  Side: Left lower extremity  Stability: No instability observed          Stability: No instability observed          Skin & Extremity Inspection: Evidence of prior arthroplastic surgery  Skin & Extremity Inspection: Evidence of prior arthroplastic surgery surgical sutures removed last week, swelling present along the lateral patellar edge  Functional ROM: Decreased ROM for hip and knee joints  Functional ROM: Decreased ROM for hip and knee joints          Muscle Tone/Strength: Functionally intact. No obvious neuro-muscular anomalies detected.  Muscle Tone/Strength: Functionally intact. No obvious neuro-muscular anomalies detected.  Sensory (Neurological): Arthropathic arthralgia        Sensory (Neurological): Arthropathic arthralgia        DTR: Patellar: 0: absent Achilles: 0: absent Plantar: deferred today  DTR: Patellar: 0: absent Achilles: 0: absent Plantar: deferred today  Palpation: No palpable anomalies   Palpation: No palpable anomalies    A/P: The primary encounter diagnosis was Chronic pain syndrome. Diagnoses of Chronic radicular lumbar pain, Lumbar radicular pain, Sensory polyneuropathy, History of bilateral knee replacement, Infection of total left knee replacement, subsequent encounter, Lumbar facet arthropathy, Lumbar spondylosis, Spinal stenosis of lumbar region without neurogenic claudication, and Neuroforaminal stenosis of lumbar spine were also pertinent to this visit. I am having Nathan Chambers maintain his gemfibrozil, vitamin C, folic acid, vitamin 0000000, multivitamin-lutein, naloxone, amLODipine-benazepril, chlorthalidone, busPIRone, DULoxetine, aspirin EC, amitriptyline, multivitamin with minerals, pravastatin, docusate sodium, acetaminophen, PARoxetine, meloxicam, doxepin, Oxycodone HCl, enoxaparin, methocarbamol, rifampin, and cephALEXin. Return Nathan Chambers will call to make appointment after he as seen pain psychology.   General Recommendations: The pain condition that the patient suffers from is best treated with a multidisciplinary approach that involves an increase in physical activity to prevent de-conditioning and worsening of the pain cycle, as well as psychological counseling (formal and/or informal) to address the co-morbid psychological affects of pain. Treatment will often involve judicious use of pain medications and interventional procedures to decrease the pain, allowing the patient to participate in the physical activity that will ultimately produce long-lasting pain reductions. The goal of the multidisciplinary approach is to return the patient to a higher level of overall function and to restore their ability to perform activities of daily living.   Patient presents today to discuss optimization of his pain management regimen.  Of note patient has been seeing neurology for his sensory polyneuropathy.  He is on Cymbalta 60 mg daily.  He has tried amitriptyline in the past  which was not effective.  He is also on Requip for his restless leg syndrome.  He has tried various medications in the past including gabapentin which was not effective.  In regards to consideration of chronic opioid therapy, I informed patient that he will need a psychological assessment which is customary for new patients to evaluate risk of substance abuse disorder.  Pending psych evaluation, we can consider buprenorphine therapy for management of his chronic pain syndrome and or tramadol.  I informed the patient that we would like to avoid direct acting opioid agonist given their side effects and risks of physical, psychological dependency and addiction.  Patient endorsed understanding.  Future considerations for opioid analgesics would include tramadol and/or buprenorphine in the form of Butrans or Belbuca.  Plan of Care  Orders:  Orders Placed This Encounter  Procedures  . Ambulatory referral to Psychology    Referral Priority:   Routine    Referral Type:   Psychiatric    Referral Reason:   Specialty Services Required    Referred to Provider:   Ursula Alert, MD    Requested Specialty:   Psychiatry    Number of Visits Requested:   1   Follow-up plan:   Return Nathan Chambers will call to make appointment after he as seen pain psychology.       Recent Visits Date Type Provider Dept  11/26/18  Office Visit Gillis Santa, MD [facility]  10/22/18 Procedure visit Gillis Santa, MD [facility]  Showing recent visits within past 90 days and meeting all other requirements   Today's Visits Date Type Provider Dept  12/27/18 Office Visit Gillis Santa, MD [facility]  Showing today's visits and meeting all other requirements   Future Appointments No visits were found meeting these conditions.  Showing future appointments within next 90 days and meeting all other requirements   Disposition: Discharge home  Discharge Date & Time: 12/27/2018; 1515 hrs.   Primary  Care Physician: Juluis Pitch, MD Location: [facility] Note by: Gillis Santa, MD Date: 12/27/2018; Time: 3:29 PM

## 2019-01-08 ENCOUNTER — Encounter
Admission: RE | Admit: 2019-01-08 | Discharge: 2019-01-08 | Disposition: A | Discharge status: Home / Self Care | Payer: 59 | Source: Ambulatory Visit | Attending: Orthopedic Surgery | Admitting: Orthopedic Surgery

## 2019-01-08 ENCOUNTER — Other Ambulatory Visit: Payer: Self-pay

## 2019-01-08 DIAGNOSIS — S83015A Lateral dislocation of left patella, initial encounter: Secondary | ICD-10-CM | POA: Diagnosis not present

## 2019-01-08 DIAGNOSIS — Z792 Long term (current) use of antibiotics: Secondary | ICD-10-CM | POA: Diagnosis not present

## 2019-01-08 DIAGNOSIS — Z96653 Presence of artificial knee joint, bilateral: Secondary | ICD-10-CM | POA: Diagnosis not present

## 2019-01-08 DIAGNOSIS — E785 Hyperlipidemia, unspecified: Secondary | ICD-10-CM | POA: Diagnosis not present

## 2019-01-08 DIAGNOSIS — I10 Essential (primary) hypertension: Secondary | ICD-10-CM | POA: Diagnosis not present

## 2019-01-08 DIAGNOSIS — G629 Polyneuropathy, unspecified: Secondary | ICD-10-CM | POA: Diagnosis not present

## 2019-01-08 DIAGNOSIS — F419 Anxiety disorder, unspecified: Secondary | ICD-10-CM | POA: Diagnosis not present

## 2019-01-08 DIAGNOSIS — Z79899 Other long term (current) drug therapy: Secondary | ICD-10-CM | POA: Diagnosis not present

## 2019-01-08 DIAGNOSIS — Z7982 Long term (current) use of aspirin: Secondary | ICD-10-CM | POA: Diagnosis not present

## 2019-01-08 DIAGNOSIS — F319 Bipolar disorder, unspecified: Secondary | ICD-10-CM | POA: Diagnosis not present

## 2019-01-08 DIAGNOSIS — Z791 Long term (current) use of non-steroidal anti-inflammatories (NSAID): Secondary | ICD-10-CM | POA: Diagnosis not present

## 2019-01-08 DIAGNOSIS — X58XXXA Exposure to other specified factors, initial encounter: Secondary | ICD-10-CM | POA: Diagnosis not present

## 2019-01-08 LAB — SARS CORONAVIRUS 2 (TAT 6-24 HRS): SARS Coronavirus 2: NEGATIVE

## 2019-01-08 NOTE — Patient Instructions (Signed)
Your procedure is scheduled on: January 10, 2019 Thursday  Report to Day Surgery on the 2nd floor of the [facility]. To find out your arrival time, please call [phone] between 1PM - 3PM on: January 09, 2019 [facility]  REMEMBER: Instructions that are not followed completely may result in serious medical risk, up to and including death; or upon the discretion of your surgeon and anesthesiologist your surgery may need to be rescheduled.  Do not eat food after midnight the night before surgery.  No gum chewing, lozengers or hard candies.  You may however, drink CLEAR liquids up to 2 hours before you are scheduled to arrive for your surgery. Do not drink anything within 2 hours of the start of your surgery.  Clear liquids include: - water  - apple juice without pulp - CLEAR gatorade - black coffee or tea (Do NOT add milk or creamers to the coffee or tea) Do NOT drink anything that is not on this list.  Type 1 and Type 2 diabetics should only drink water.    No Alcohol for 24 hours before or after surgery.  No Smoking including e-cigarettes for 24 hours prior to surgery.  No chewable tobacco products for at least 6 hours prior to surgery.  No nicotine patches on the day of surgery.  On the morning of surgery brush your teeth with toothpaste and water, you may rinse your mouth with mouthwash if you wish. Do not swallow any toothpaste or mouthwash.  Notify your doctor if there is any change in your medical condition (cold, fever, infection).  Do not wear jewelry, make-up, hairpins, clips or nail polish.  Do not wear lotions, powders, or perfumes.   Do not shave 48 hours prior to surgery.   Contacts and dentures may not be worn into surgery.  Do not bring valuables to the hospital, including drivers license, insurance or credit cards.  [facility] is not responsible for any belongings or valuables.   TAKE THESE MEDICATIONS THE MORNING OF  SURGERY: BUSPAR ANTIBIOTICS CYMBALTA HYGROTON VALIUM IF NEEDED  PAXIL  DO NOT TAKE AMLODIPINE-BENAZEPRIL DAY OF SURGERY   Use CHG Soap  as directed on instruction sheet.   Follow recommendations from Cardiologist, Pulmonologist or PCP regarding stopping Aspirin, Coumadin, Plavix, Eliquis, Pradaxa, or Pletal.  Stop Anti-inflammatories (NSAIDS) such as MELOXICAM,  Advil, Aleve, Ibuprofen, Motrin, Naproxen, Naprosyn and Aspirin based products such as Excedrin, Goodys Powder, BC Powder. (May take Tylenol or Acetaminophen if needed.)  Stop ANY OVER THE COUNTER supplements until after surgery FOLIC ACID AND VIT C (May continue Vitamin D, Vitamin B, and multivitamin.)  Wear comfortable clothing (specific to your surgery type) to the hospital.  Plan for stool softeners for home use.   If you are being discharged the day of surgery, you will not be allowed to drive home. You will need a responsible adult to drive you home and stay with you that night.   If you are taking public transportation, you will need to have a responsible adult with you. Please confirm with your physician that it is acceptable to use public transportation.   Please call [phone] if you have any questions about these instructions.

## 2019-01-10 ENCOUNTER — Other Ambulatory Visit: Payer: Self-pay

## 2019-01-10 ENCOUNTER — Encounter: Payer: Self-pay | Admitting: *Deleted

## 2019-01-10 ENCOUNTER — Ambulatory Visit: Payer: 59 | Admitting: Certified Registered Nurse Anesthetist

## 2019-01-10 ENCOUNTER — Ambulatory Visit
Admission: RE | Admit: 2019-01-10 | Discharge: 2019-01-10 | Disposition: A | Discharge status: Home / Self Care | Payer: 59 | Attending: Orthopedic Surgery | Admitting: Orthopedic Surgery

## 2019-01-10 ENCOUNTER — Encounter
Admission: RE | Disposition: A | Discharge status: Home / Self Care | Payer: Self-pay | Source: Home / Self Care | Attending: Orthopedic Surgery

## 2019-01-10 DIAGNOSIS — Z7982 Long term (current) use of aspirin: Secondary | ICD-10-CM | POA: Insufficient documentation

## 2019-01-10 DIAGNOSIS — Z792 Long term (current) use of antibiotics: Secondary | ICD-10-CM | POA: Insufficient documentation

## 2019-01-10 DIAGNOSIS — F419 Anxiety disorder, unspecified: Secondary | ICD-10-CM | POA: Insufficient documentation

## 2019-01-10 DIAGNOSIS — Z79899 Other long term (current) drug therapy: Secondary | ICD-10-CM | POA: Insufficient documentation

## 2019-01-10 DIAGNOSIS — Z96653 Presence of artificial knee joint, bilateral: Secondary | ICD-10-CM | POA: Insufficient documentation

## 2019-01-10 DIAGNOSIS — X58XXXA Exposure to other specified factors, initial encounter: Secondary | ICD-10-CM | POA: Insufficient documentation

## 2019-01-10 DIAGNOSIS — F319 Bipolar disorder, unspecified: Secondary | ICD-10-CM | POA: Insufficient documentation

## 2019-01-10 DIAGNOSIS — I10 Essential (primary) hypertension: Secondary | ICD-10-CM | POA: Insufficient documentation

## 2019-01-10 DIAGNOSIS — E785 Hyperlipidemia, unspecified: Secondary | ICD-10-CM | POA: Insufficient documentation

## 2019-01-10 DIAGNOSIS — Z791 Long term (current) use of non-steroidal anti-inflammatories (NSAID): Secondary | ICD-10-CM | POA: Insufficient documentation

## 2019-01-10 DIAGNOSIS — G629 Polyneuropathy, unspecified: Secondary | ICD-10-CM | POA: Insufficient documentation

## 2019-01-10 DIAGNOSIS — S83015A Lateral dislocation of left patella, initial encounter: Secondary | ICD-10-CM | POA: Insufficient documentation

## 2019-01-10 HISTORY — PX: PATELLAR TENDON REPAIR: SHX737

## 2019-01-10 LAB — SYNOVIAL CELL COUNT + DIFF, W/ CRYSTALS
Crystals, Fluid: NONE SEEN
Eosinophils-Synovial: 1 %
Lymphocytes-Synovial Fld: 25 %
Monocyte-Macrophage-Synovial Fluid: 2 %
Neutrophil, Synovial: 72 %
WBC, Synovial: 198 /mm3 (ref 0–200)

## 2019-01-10 SURGERY — REPAIR, TENDON, PATELLAR
Anesthesia: General | Site: Knee | Laterality: Left

## 2019-01-10 MED ORDER — MIDAZOLAM HCL 2 MG/2ML IJ SOLN
INTRAMUSCULAR | Status: DC | PRN
  Administered 2019-01-10: 2 mg via INTRAVENOUS

## 2019-01-10 MED ORDER — LABETALOL HCL 5 MG/ML IV SOLN
INTRAVENOUS | Status: AC
  Administered 2019-01-10: 5 mg via INTRAVENOUS
  Filled 2019-01-10: qty 4

## 2019-01-10 MED ORDER — PROPOFOL 10 MG/ML IV BOLUS
INTRAVENOUS | Status: AC
  Filled 2019-01-10: qty 20

## 2019-01-10 MED ORDER — VANCOMYCIN HCL 1000 MG IV SOLR
INTRAVENOUS | Status: AC
  Filled 2019-01-10: qty 1000

## 2019-01-10 MED ORDER — FENTANYL CITRATE (PF) 100 MCG/2ML IJ SOLN
INTRAMUSCULAR | Status: AC
  Administered 2019-01-10: 25 ug via INTRAVENOUS
  Filled 2019-01-10: qty 2

## 2019-01-10 MED ORDER — METHOCARBAMOL 1000 MG/10ML IJ SOLN: 500.0000 mg | Freq: Four times a day (QID) | INTRAVENOUS | Status: DC | PRN

## 2019-01-10 MED ORDER — ACETAMINOPHEN 10 MG/ML IV SOLN
INTRAVENOUS | Status: AC
  Filled 2019-01-10: qty 100

## 2019-01-10 MED ORDER — SODIUM CHLORIDE 0.9 % IV SOLN: INTRAVENOUS | Status: DC

## 2019-01-10 MED ORDER — DEXTROSE 5 % IV SOLN
3.0000 g | Freq: Once | INTRAVENOUS | Status: AC
  Administered 2019-01-10: 08:00:00 3 g via INTRAVENOUS
  Filled 2019-01-10: qty 3

## 2019-01-10 MED ORDER — SUGAMMADEX SODIUM 500 MG/5ML IV SOLN
INTRAVENOUS | Status: DC | PRN
  Administered 2019-01-10: 300 mg via INTRAVENOUS

## 2019-01-10 MED ORDER — PROPOFOL 10 MG/ML IV BOLUS
INTRAVENOUS | Status: DC | PRN
  Administered 2019-01-10: 100 mg via INTRAVENOUS
  Administered 2019-01-10: 200 mg via INTRAVENOUS
  Administered 2019-01-10: 50 mg via INTRAVENOUS

## 2019-01-10 MED ORDER — NEOMYCIN-POLYMYXIN B GU 40-200000 IR SOLN
Status: DC | PRN
  Administered 2019-01-10: 14 mL

## 2019-01-10 MED ORDER — MIDAZOLAM HCL 2 MG/2ML IJ SOLN
INTRAMUSCULAR | Status: AC
  Filled 2019-01-10: qty 2

## 2019-01-10 MED ORDER — OXYCODONE HCL 5 MG PO TABS
10.0000 mg | ORAL_TABLET | ORAL | Status: DC | PRN
  Administered 2019-01-10: 09:00:00 15 mg via ORAL

## 2019-01-10 MED ORDER — ACETAMINOPHEN 10 MG/ML IV SOLN
INTRAVENOUS | Status: DC | PRN
  Administered 2019-01-10: 1000 mg via INTRAVENOUS

## 2019-01-10 MED ORDER — ONDANSETRON HCL 4 MG/2ML IJ SOLN
INTRAMUSCULAR | Status: DC | PRN
  Administered 2019-01-10: 4 mg via INTRAVENOUS

## 2019-01-10 MED ORDER — SUGAMMADEX SODIUM 500 MG/5ML IV SOLN
INTRAVENOUS | Status: AC
  Filled 2019-01-10: qty 5

## 2019-01-10 MED ORDER — FENTANYL CITRATE (PF) 100 MCG/2ML IJ SOLN
INTRAMUSCULAR | Status: AC
  Filled 2019-01-10: qty 2

## 2019-01-10 MED ORDER — PHENYLEPHRINE HCL (PRESSORS) 10 MG/ML IV SOLN
INTRAVENOUS | Status: DC | PRN
  Administered 2019-01-10 (×4): 200 ug via INTRAVENOUS
  Administered 2019-01-10: 100 ug via INTRAVENOUS
  Administered 2019-01-10: 200 ug via INTRAVENOUS

## 2019-01-10 MED ORDER — ONDANSETRON HCL 4 MG PO TABS: 4.0000 mg | ORAL_TABLET | Freq: Four times a day (QID) | ORAL | Status: DC | PRN

## 2019-01-10 MED ORDER — LACTATED RINGERS IV SOLN
INTRAVENOUS | Status: DC
  Administered 2019-01-10: 07:00:00 via INTRAVENOUS

## 2019-01-10 MED ORDER — ACETAMINOPHEN 500 MG PO TABS: 1000.0000 mg | ORAL_TABLET | Freq: Four times a day (QID) | ORAL | Status: DC

## 2019-01-10 MED ORDER — FAMOTIDINE 20 MG PO TABS
20.0000 mg | ORAL_TABLET | Freq: Once | ORAL | Status: AC
  Administered 2019-01-10: 07:00:00 20 mg via ORAL

## 2019-01-10 MED ORDER — DEXAMETHASONE SODIUM PHOSPHATE 10 MG/ML IJ SOLN
INTRAMUSCULAR | Status: AC
  Filled 2019-01-10: qty 1

## 2019-01-10 MED ORDER — ROCURONIUM BROMIDE 50 MG/5ML IV SOLN
INTRAVENOUS | Status: AC
  Filled 2019-01-10: qty 1

## 2019-01-10 MED ORDER — LABETALOL HCL 5 MG/ML IV SOLN
5.0000 mg | INTRAVENOUS | Status: DC | PRN
  Administered 2019-01-10 (×2): 5 mg via INTRAVENOUS
  Filled 2019-01-10 (×3): qty 4

## 2019-01-10 MED ORDER — METHOCARBAMOL 500 MG PO TABS
500.0000 mg | ORAL_TABLET | Freq: Four times a day (QID) | ORAL | Status: DC | PRN
  Administered 2019-01-10: 09:00:00 500 mg via ORAL

## 2019-01-10 MED ORDER — VANCOMYCIN HCL 1000 MG IV SOLR
INTRAVENOUS | Status: DC | PRN
  Administered 2019-01-10: 1000 mg

## 2019-01-10 MED ORDER — OXYCODONE HCL 5 MG PO TABS
ORAL_TABLET | ORAL | Status: AC
  Filled 2019-01-10: qty 2

## 2019-01-10 MED ORDER — METHOCARBAMOL 500 MG PO TABS
ORAL_TABLET | ORAL | Status: AC
  Administered 2019-01-10: 500 mg via ORAL
  Filled 2019-01-10: qty 1

## 2019-01-10 MED ORDER — ACETAMINOPHEN 325 MG PO TABS: 325.0000 mg | ORAL_TABLET | Freq: Four times a day (QID) | ORAL | Status: DC | PRN

## 2019-01-10 MED ORDER — LIDOCAINE HCL (PF) 2 % IJ SOLN
INTRAMUSCULAR | Status: AC
  Filled 2019-01-10: qty 10

## 2019-01-10 MED ORDER — PROPOFOL 500 MG/50ML IV EMUL
INTRAVENOUS | Status: AC
  Filled 2019-01-10: qty 50

## 2019-01-10 MED ORDER — DEXAMETHASONE SODIUM PHOSPHATE 10 MG/ML IJ SOLN
INTRAMUSCULAR | Status: DC | PRN
  Administered 2019-01-10: 10 mg via INTRAVENOUS

## 2019-01-10 MED ORDER — HYDROMORPHONE HCL 1 MG/ML IJ SOLN: 0.5000 mg | INTRAMUSCULAR | Status: DC | PRN

## 2019-01-10 MED ORDER — SUCCINYLCHOLINE CHLORIDE 20 MG/ML IJ SOLN
INTRAMUSCULAR | Status: AC
  Filled 2019-01-10: qty 1

## 2019-01-10 MED ORDER — SUCCINYLCHOLINE CHLORIDE 20 MG/ML IJ SOLN
INTRAMUSCULAR | Status: DC | PRN
  Administered 2019-01-10: 180 mg via INTRAVENOUS

## 2019-01-10 MED ORDER — FAMOTIDINE 20 MG PO TABS
ORAL_TABLET | ORAL | Status: AC
  Administered 2019-01-10: 20 mg via ORAL
  Filled 2019-01-10: qty 1

## 2019-01-10 MED ORDER — OXYCODONE HCL 5 MG PO TABS: 5.0000 mg | ORAL_TABLET | ORAL | Status: DC | PRN

## 2019-01-10 MED ORDER — ONDANSETRON HCL 4 MG/2ML IJ SOLN: 4.0000 mg | Freq: Once | INTRAMUSCULAR | Status: DC | PRN

## 2019-01-10 MED ORDER — BUPIVACAINE HCL (PF) 0.5 % IJ SOLN
INTRAMUSCULAR | Status: AC
  Filled 2019-01-10: qty 30

## 2019-01-10 MED ORDER — ROCURONIUM BROMIDE 100 MG/10ML IV SOLN
INTRAVENOUS | Status: DC | PRN
  Administered 2019-01-10: 30 mg via INTRAVENOUS

## 2019-01-10 MED ORDER — ONDANSETRON HCL 4 MG/2ML IJ SOLN: 4.0000 mg | Freq: Four times a day (QID) | INTRAMUSCULAR | Status: DC | PRN

## 2019-01-10 MED ORDER — FENTANYL CITRATE (PF) 100 MCG/2ML IJ SOLN
INTRAMUSCULAR | Status: DC | PRN
  Administered 2019-01-10 (×4): 50 ug via INTRAVENOUS

## 2019-01-10 MED ORDER — ONDANSETRON HCL 4 MG/2ML IJ SOLN
INTRAMUSCULAR | Status: AC
  Filled 2019-01-10: qty 2

## 2019-01-10 MED ORDER — PHENYLEPHRINE HCL (PRESSORS) 10 MG/ML IV SOLN
INTRAVENOUS | Status: AC
  Filled 2019-01-10: qty 1

## 2019-01-10 MED ORDER — FENTANYL CITRATE (PF) 100 MCG/2ML IJ SOLN
25.0000 ug | INTRAMUSCULAR | Status: AC | PRN
  Administered 2019-01-10 (×6): 25 ug via INTRAVENOUS

## 2019-01-10 MED ORDER — LIDOCAINE HCL (CARDIAC) PF 100 MG/5ML IV SOSY
PREFILLED_SYRINGE | INTRAVENOUS | Status: DC | PRN
  Administered 2019-01-10: 100 mg via INTRAVENOUS

## 2019-01-10 MED ORDER — METOCLOPRAMIDE HCL 10 MG PO TABS: 5.0000 mg | ORAL_TABLET | Freq: Three times a day (TID) | ORAL | Status: DC | PRN

## 2019-01-10 MED ORDER — OXYCODONE HCL 5 MG PO TABS
ORAL_TABLET | ORAL | Status: AC
  Administered 2019-01-10: 15 mg via ORAL
  Filled 2019-01-10: qty 1

## 2019-01-10 MED ORDER — METOCLOPRAMIDE HCL 5 MG/ML IJ SOLN: 5.0000 mg | Freq: Three times a day (TID) | INTRAMUSCULAR | Status: DC | PRN

## 2019-01-10 SURGICAL SUPPLY — 38 items
BANDAGE ELASTIC 6 LF NS (GAUZE/BANDAGES/DRESSINGS) ×3 IMPLANT
BNDG STRETCH 4X75 STRL LF (GAUZE/BANDAGES/DRESSINGS) ×3 IMPLANT
CANISTER SUCT 1200ML W/VALVE (MISCELLANEOUS) ×3 IMPLANT
CHLORAPREP W/TINT 26 (MISCELLANEOUS) ×3 IMPLANT
COVER WAND RF STERILE (DRAPES) ×3 IMPLANT
CUFF TOURN SGL QUICK 24 (TOURNIQUET CUFF)
CUFF TOURN SGL QUICK 30 (TOURNIQUET CUFF)
CUFF TRNQT CYL 24X4X16.5-23 (TOURNIQUET CUFF) IMPLANT
CUFF TRNQT CYL 30X4X21-28X (TOURNIQUET CUFF) IMPLANT
ELECT CAUTERY BLADE 6.4 (BLADE) ×3 IMPLANT
ELECT REM PT RETURN 9FT ADLT (ELECTROSURGICAL) ×3
ELECTRODE REM PT RTRN 9FT ADLT (ELECTROSURGICAL) ×1 IMPLANT
GAUZE SPONGE 4X4 12PLY STRL (GAUZE/BANDAGES/DRESSINGS) ×3 IMPLANT
GAUZE XEROFORM 1X8 LF (GAUZE/BANDAGES/DRESSINGS) ×3 IMPLANT
GLOVE SURG SYN 9.0  PF PI (GLOVE) ×2
GLOVE SURG SYN 9.0 PF PI (GLOVE) ×1 IMPLANT
GOWN SRG 2XL LVL 4 RGLN SLV (GOWNS) ×1 IMPLANT
GOWN STRL NON-REIN 2XL LVL4 (GOWNS) ×2
GOWN STRL REUS W/ TWL LRG LVL3 (GOWN DISPOSABLE) ×1 IMPLANT
GOWN STRL REUS W/TWL LRG LVL3 (GOWN DISPOSABLE) ×2
HANDPIECE INTERPULSE COAX TIP (DISPOSABLE) ×2
IMMOB KNEE 24 THIGH 24 443303 (SOFTGOODS) ×3 IMPLANT
KIT STIMULAN RAPID CURE 5CC (Orthopedic Implant) ×3 IMPLANT
KIT TURNOVER KIT A (KITS) ×3 IMPLANT
NS IRRIG 500ML POUR BTL (IV SOLUTION) ×3 IMPLANT
PACK TOTAL KNEE (MISCELLANEOUS) ×3 IMPLANT
PAD ABD DERMACEA PRESS 5X9 (GAUZE/BANDAGES/DRESSINGS) ×3 IMPLANT
SCALPEL PROTECTED #10 DISP (BLADE) ×6 IMPLANT
SET HNDPC FAN SPRY TIP SCT (DISPOSABLE) ×1 IMPLANT
STAPLER SKIN PROX 35W (STAPLE) ×3 IMPLANT
SUT QUILL PDO 2 24X24 VLT (SUTURE) ×12 IMPLANT
SUT QUILL PDO 2-0 40CM 26MM (SUTURE) ×3 IMPLANT
SUT VIC AB 0 CT1 27 (SUTURE) ×2
SUT VIC AB 0 CT1 27XCR 8 STRN (SUTURE) ×1 IMPLANT
SUT VIC AB 0 CT1 36 (SUTURE) ×3 IMPLANT
SUT VIC AB 2-0 CT1 27 (SUTURE) ×2
SUT VIC AB 2-0 CT1 TAPERPNT 27 (SUTURE) ×1 IMPLANT
SYR 10ML LL (SYRINGE) ×3 IMPLANT

## 2019-01-10 NOTE — Anesthesia Postprocedure Evaluation (Signed)
Anesthesia Post Note  Patient: [facility]  Procedure(s) Performed: LEFT MEDIAL RETINACULAR REPAIR (Left Knee)  Patient location during evaluation: PACU Anesthesia Type: General Level of consciousness: awake and alert Pain management: pain level controlled Vital Signs Assessment: post-procedure vital signs reviewed and stable Respiratory status: spontaneous breathing, nonlabored ventilation, respiratory function stable and patient connected to nasal cannula oxygen Cardiovascular status: blood pressure returned to baseline and stable Postop Assessment: no apparent nausea or vomiting Anesthetic complications: no     Last Vitals:  Vitals:   01/10/19 0944 01/10/19 0946  BP:  131/77  Pulse: 72 75  Resp: (!) 0 15  Temp:  36.8 C  SpO2: 93% 94%    Last Pain:  Vitals:   01/10/19 0946  TempSrc:   PainSc: 5                  Molli Barrows

## 2019-01-10 NOTE — Anesthesia Preprocedure Evaluation (Signed)
Anesthesia Evaluation  Patient identified by MRN, date of birth, ID band Patient awake    Reviewed: Allergy & Precautions, H&P , NPO status , Patient's Chart, lab work & pertinent test results, reviewed documented beta blocker date and time   History of Anesthesia Complications (+) history of anesthetic complications  Airway Mallampati: III  TM Distance: <3 FB Neck ROM: full  Mouth opening: Limited Mouth Opening  Dental  (+) Teeth Intact   Pulmonary neg pulmonary ROS,    Pulmonary exam normal        Cardiovascular Exercise Tolerance: Good hypertension, Pt. on medications negative cardio ROS Normal cardiovascular exam Rhythm:regular Rate:Normal     Neuro/Psych  Headaches, PSYCHIATRIC DISORDERS Anxiety Bipolar Disorder  Neuromuscular disease negative neurological ROS  negative psych ROS   GI/Hepatic negative GI ROS, Neg liver ROS, GERD  ,  Endo/Other  negative endocrine ROS  Renal/GU Renal diseasenegative Renal ROS  negative genitourinary   Musculoskeletal  (+) Arthritis ,   Abdominal   Peds  Hematology negative hematology ROS (+)   Anesthesia Other Findings Past Medical History: No date: Anxiety No date: Arthritis     Comment:  knees, hands No date: Bipolar disorder (HCC) No date: Chronic kidney disease     Comment:  arf 2014 due to dehydration No date: Complication of anesthesia     Comment:  woke up during last knee surgery feb 2020 No date: GERD (gastroesophageal reflux disease)     Comment:  rare no meds No date: Headache No date: Hemorrhoids No date: Hyperlipidemia No date: Hypertension No date: LFT elevation No date: NAFLD (nonalcoholic fatty liver disease) Past Surgical History: 10/30/2015: ACHILLES TENDON SURGERY; Right     Comment:  Procedure: ACHILLES TENDON REPAIR/ HEGLAND OSTECTOMY;                Surgeon: [name], DPM;  Location: [facility] ORS;                Service: Podiatry;   Laterality: Right; No date: COLONOSCOPY 04/07/2017: COLONOSCOPY WITH PROPOFOL; N/A     Comment:  Procedure: COLONOSCOPY WITH PROPOFOL;  Surgeon: [place],               Benay Pike, MD;  Location: [facility] ENDOSCOPY;  Service:               Gastroenterology;  Laterality: N/A; No date: FRACTURE SURGERY; Left     Comment:  MIDDLE FINGER No date: HERNIA REPAIR 12/08/2017: I&D KNEE WITH POLY EXCHANGE; Left     Comment:  Procedure: IRRIGATION AND DEBRIDEMENT KNEE WITH POLY               EXCHANGE;  Surgeon: Dereck Leep, MD;  Location: [facility]               ORS;  Service: Orthopedics;  Laterality: Left; 2017: JOINT REPLACEMENT; Bilateral     Comment:  bilateral knees 12/08/2017: KNEE ARTHROTOMY; Left     Comment:  Procedure: KNEE ARTHROTOMY, I&D, [place] OUT, POLY               EXCHANGE;  Surgeon: Dereck Leep, MD;  Location: [facility]               ORS;  Service: Orthopedics;  Laterality: Left; No date: LIPOMA EXCISION 04/19/2016: TOTAL KNEE ARTHROPLASTY; Bilateral     Comment:  Procedure: TOTAL KNEE BILATERAL;  Surgeon: Hessie Knows,              MD;  Location:  [facility] ORS;  Service: Orthopedics;                Laterality: Bilateral; 09/11/2018: TOTAL KNEE ARTHROPLASTY; Left     Comment:  Procedure: TOTAL KNEE ARTHROPLASTY - LEFT - HARDWARE               REMOVAL WITH INSERTION OF ANTIBIOTIC SPACER;  Surgeon:               Hessie Knows, MD;  Location: [facility] ORS;  Service:               Orthopedics;  Laterality: Left; 06/11/2018: TOTAL KNEE REVISION; Left     Comment:  Procedure: INCISION AND DRAINAGE OF LEFT KNEE, POLY               EXCHANGE;  Surgeon: Hessie Knows, MD;  Location: [facility]               ORS;  Service: Orthopedics;  Laterality: Left; 11/27/2018: TOTAL KNEE REVISION; Left     Comment:  Procedure: TOTAL KNEE REVISION REIMPLANTATION OF LEFT               TOTAL KNEE;  Surgeon: Hessie Knows, MD;  Location: [facility]               ORS;  Service: Orthopedics;  Laterality: Left; No date: VASECTOMY BMI     Body Mass Index: 41.88 kg/m     Reproductive/Obstetrics negative OB ROS                             Anesthesia Physical Anesthesia Plan  ASA: III  Anesthesia Plan: General ETT   Post-op Pain Management:    Induction:   PONV Risk Score and Plan:   Airway Management Planned: Video Laryngoscope Planned  Additional Equipment:   Intra-op Plan:   Post-operative Plan:   Informed Consent: I have reviewed the patients History and Physical, chart, labs and discussed the procedure including the risks, benefits and alternatives for the proposed anesthesia with the patient or authorized representative who has indicated his/her understanding and acceptance.     Dental Advisory Given  Plan Discussed with: CRNA  Anesthesia Plan Comments: (Refuses spinal anesthesia sec. To delayed bladder functioning from previous experience. JA)        Anesthesia Quick Evaluation

## 2019-01-10 NOTE — Anesthesia Post-op Follow-up Note (Signed)
Anesthesia QCDR form completed.        

## 2019-01-10 NOTE — Transfer of Care (Signed)
Immediate Anesthesia Transfer of Care Note  Patient: [facility]  Procedure(s) Performed: LEFT MEDIAL RETINACULAR REPAIR (Left Knee)  Patient Location: PACU  Anesthesia Type:General  Level of Consciousness: drowsy  Airway & Oxygen Therapy: Patient Spontanous Breathing and Patient connected to face mask oxygen  Post-op Assessment: Report given to RN, Post -op Vital signs reviewed and stable and Patient moving all extremities X 4  Post vital signs: Reviewed and stable  Last Vitals:  Vitals Value Taken Time  BP 173/108 01/10/19 0856  Temp    Pulse 99 01/10/19 0859  Resp 14 01/10/19 0859  SpO2 96 % 01/10/19 0859  Vitals shown include unvalidated device data.  Last Pain:  Vitals:   01/10/19 0856  TempSrc:   PainSc: (P) Asleep         Complications: No apparent anesthesia complications

## 2019-01-10 NOTE — Discharge Instructions (Addendum)
Try not to do much activity through the weekend until wound recheck on Monday.  Resume regular medications today.  Hold on therapy until rechecked  AMBULATORY SURGERY  DISCHARGE INSTRUCTIONS   1) The drugs that you were given will stay in your system until tomorrow so for the next 24 hours you should not:  A) Drive an automobile B) Make any legal decisions C) Drink any alcoholic beverage   2) You may resume regular meals tomorrow.  Today it is better to start with liquids and gradually work up to solid foods.  You may eat anything you prefer, but it is better to start with liquids, then soup and crackers, and gradually work up to solid foods.   3) Please notify your doctor immediately if you have any unusual bleeding, trouble breathing, redness and pain at the surgery site, drainage, fever, or pain not relieved by medication.    4) Additional Instructions:        Please contact your physician with any problems or Same Day Surgery at [phone], Monday through Friday 6 am to 4 pm, or [place] at [facility] number at [phone].

## 2019-01-10 NOTE — Progress Notes (Signed)
PT BP: 179/107. Dr. Andree Elk notified. Acknowledged. Orders received.

## 2019-01-10 NOTE — Progress Notes (Signed)
Pt. States he has no pain medication  at home , call to Dr. Rudene Christians . Geralyn Flash  PA to send pain med script to [facility] in [place].

## 2019-01-10 NOTE — Anesthesia Procedure Notes (Signed)
Procedure Name: Intubation Date/Time: 01/10/2019 7:32 AM Performed by: Caryl Asp, CRNA Pre-anesthesia Checklist: Patient identified, Patient being monitored, Timeout performed, Emergency Drugs available and Suction available Patient Re-evaluated:Patient Re-evaluated prior to induction Oxygen Delivery Method: Circle system utilized Preoxygenation: Pre-oxygenation with 100% oxygen Induction Type: IV induction Ventilation: Two handed mask ventilation required and Oral airway inserted - appropriate to patient size Laryngoscope Size: McGraph and 4 Grade View: Grade I Tube type: Oral Tube size: 7.5 mm Number of attempts: 1 Airway Equipment and Method: Stylet and Video-laryngoscopy Placement Confirmation: ETT inserted through vocal cords under direct vision,  positive ETCO2 and breath sounds checked- equal and bilateral Secured at: 23 cm Tube secured with: Tape Dental Injury: Teeth and Oropharynx as per pre-operative assessment

## 2019-01-10 NOTE — H&P (Signed)
Reviewed paper H+P, will be scanned into chart. No changes noted.  

## 2019-01-10 NOTE — Op Note (Addendum)
01/10/2019  8:57 AM  PATIENT:  [facility]  53 y.o. male  PRE-OPERATIVE DIAGNOSIS:  LATERAL DISLOCATION OF LEFT PATELLA  POST-OPERATIVE DIAGNOSIS:  LATERAL DISLOCATION OF LEFT PATELLA  PROCEDURE:  Procedure(s): LEFT MEDIAL RETINACULAR REPAIR (Left) with lateral release open  SURGEON: Laurene Footman, MD  ASSISTANTS: Rachelle Hora, PA-C  ANESTHESIA:   general  EBL:  No intake/output data recorded.  BLOOD ADMINISTERED:none  DRAINS: none   LOCAL MEDICATIONS USED:  NONE  SPECIMEN:  Source of Specimen:  Cultures and synovial fluid left knee  DISPOSITION OF SPECIMEN:  Microbiology  COUNTS:  YES  TOURNIQUET: 42 minutes at 300 mmHg  IMPLANTS: Stimulan antibiotic beads were placed  DICTATION: .Dragon Dictation patient was brought to the operating room and after adequate general anesthesia was obtained the left leg was prepped and draped in the usual sterile fashion.  After patient identification and timeout procedures were completed tourniquet was raised and a midline skin incision was made excising some of the prior scar.  The medial retinaculum had stretch but was not ruptured and the patella was dislocated lateral.  The medial retinaculum was opened and synovial fluid was sent for analysis as well as 2 sets of cultures.  After excising scar on the medial side and removing some of the stretched out medial retinaculum attention was turned laterally and from an outside in technique lateral release was performed from patellar tendon to superior pole the patella this allowed the patella to be relocated and through range of motion acceptable alignment of the patella was obtained.  Sutures were then placed using #5 Ethibond first lasso type suture wrapped around the lateral aspect of the patella and reattaching medial followed by multiple five #5 Ethibond to maintain the medial retinaculum with a sister range of motion this repair held.  Next the capsule was closed using a heavy Quill suture  followed by 2 oh Quill subcutaneously and skin staples.  Tourniquet was let down prior to wound closure to make sure there is no significant bleeding it was raised again as further release was carried out and after Xeroform 4 x 4 web roll ABDs and Ace wrap were applied tourniquet was let down at the close of the case  PLAN OF CARE: Discharge to home after PACU  PATIENT DISPOSITION:  PACU - hemodynamically stable.

## 2019-01-11 ENCOUNTER — Encounter: Payer: Self-pay | Admitting: Orthopedic Surgery

## 2019-01-13 LAB — BODY FLUID CULTURE
Culture: NO GROWTH
Gram Stain: NONE SEEN

## 2019-01-13 LAB — CYTOLOGY - NON PAP

## 2019-01-15 LAB — AEROBIC/ANAEROBIC CULTURE W GRAM STAIN (SURGICAL/DEEP WOUND)
Culture: NO GROWTH
Culture: NO GROWTH
Gram Stain: NONE SEEN
Gram Stain: NONE SEEN

## 2019-01-22 ENCOUNTER — Encounter: Payer: Self-pay | Admitting: Infectious Diseases

## 2019-01-22 ENCOUNTER — Other Ambulatory Visit: Payer: Self-pay

## 2019-01-22 ENCOUNTER — Ambulatory Visit: Payer: 59 | Attending: Infectious Diseases | Admitting: Infectious Diseases

## 2019-01-22 ENCOUNTER — Other Ambulatory Visit
Admission: RE | Admit: 2019-01-22 | Discharge: 2019-01-22 | Disposition: A | Discharge status: Home / Self Care | Payer: 59 | Source: Ambulatory Visit | Attending: Infectious Diseases | Admitting: Infectious Diseases

## 2019-01-22 VITALS — BP 146/88 | HR 83 | Temp 97.0°F | Wt 330.0 lb

## 2019-01-22 DIAGNOSIS — I10 Essential (primary) hypertension: Secondary | ICD-10-CM

## 2019-01-22 DIAGNOSIS — Z96653 Presence of artificial knee joint, bilateral: Secondary | ICD-10-CM

## 2019-01-22 DIAGNOSIS — Z87891 Personal history of nicotine dependence: Secondary | ICD-10-CM

## 2019-01-22 DIAGNOSIS — T8454XD Infection and inflammatory reaction due to internal left knee prosthesis, subsequent encounter: Secondary | ICD-10-CM | POA: Diagnosis not present

## 2019-01-22 DIAGNOSIS — T8450XD Infection and inflammatory reaction due to unspecified internal joint prosthesis, subsequent encounter: Secondary | ICD-10-CM

## 2019-01-22 DIAGNOSIS — Z792 Long term (current) use of antibiotics: Secondary | ICD-10-CM

## 2019-01-22 DIAGNOSIS — E785 Hyperlipidemia, unspecified: Secondary | ICD-10-CM

## 2019-01-22 DIAGNOSIS — B9561 Methicillin susceptible Staphylococcus aureus infection as the cause of diseases classified elsewhere: Secondary | ICD-10-CM | POA: Diagnosis not present

## 2019-01-22 DIAGNOSIS — M00062 Staphylococcal arthritis, left knee: Secondary | ICD-10-CM | POA: Diagnosis not present

## 2019-01-22 DIAGNOSIS — F419 Anxiety disorder, unspecified: Secondary | ICD-10-CM

## 2019-01-22 DIAGNOSIS — Z888 Allergy status to other drugs, medicaments and biological substances status: Secondary | ICD-10-CM

## 2019-01-22 DIAGNOSIS — Z79899 Other long term (current) drug therapy: Secondary | ICD-10-CM

## 2019-01-22 DIAGNOSIS — F319 Bipolar disorder, unspecified: Secondary | ICD-10-CM

## 2019-01-22 LAB — COMPREHENSIVE METABOLIC PANEL
ALT: 38 U/L (ref 0–44)
AST: 45 U/L — ABNORMAL HIGH (ref 15–41)
Albumin: 4.3 g/dL (ref 3.5–5.0)
Alkaline Phosphatase: 108 U/L (ref 38–126)
Anion gap: 11 (ref 5–15)
BUN: 9 mg/dL (ref 6–20)
CO2: 26 mmol/L (ref 22–32)
Calcium: 9.5 mg/dL (ref 8.9–10.3)
Chloride: 102 mmol/L (ref 98–111)
Creatinine, Ser: 0.75 mg/dL (ref 0.61–1.24)
GFR calc Af Amer: >60 mL/min (ref >60)
GFR calc non Af Amer: >60 mL/min (ref >60)
Glucose, Bld: 152 mg/dL — ABNORMAL HIGH (ref 70–99)
Potassium: 3.9 mmol/L (ref 3.5–5.1)
Sodium: 139 mmol/L (ref 135–145)
Total Bilirubin: 0.6 mg/dL (ref 0.3–1.2)
Total Protein: 7.9 g/dL (ref 6.5–8.1)

## 2019-01-22 LAB — CBC WITH DIFFERENTIAL/PLATELET
Abs Immature Granulocytes: 0.06 10*3/uL (ref 0.00–0.07)
Basophils Absolute: 0.1 10*3/uL (ref 0.0–0.1)
Basophils Relative: 1 %
Eosinophils Absolute: 0.8 10*3/uL — ABNORMAL HIGH (ref 0.0–0.5)
Eosinophils Relative: 7 %
HCT: 43.8 % (ref 39.0–52.0)
Hemoglobin: 14.2 g/dL (ref 13.0–17.0)
Immature Granulocytes: 1 %
Lymphocytes Relative: 16 %
Lymphs Abs: 1.7 10*3/uL (ref 0.7–4.0)
MCH: 27.8 pg (ref 26.0–34.0)
MCHC: 32.4 g/dL (ref 30.0–36.0)
MCV: 85.9 fL (ref 80.0–100.0)
Monocytes Absolute: 0.6 10*3/uL (ref 0.1–1.0)
Monocytes Relative: 6 %
Neutro Abs: 7 10*3/uL (ref 1.7–7.7)
Neutrophils Relative %: 69 %
Platelets: 351 10*3/uL (ref 150–400)
RBC: 5.1 MIL/uL (ref 4.22–5.81)
RDW: 13 % (ref 11.5–15.5)
WBC: 10.2 10*3/uL (ref 4.0–10.5)
nRBC: 0 % (ref 0.0–0.2)

## 2019-01-22 LAB — C-REACTIVE PROTEIN: CRP: 1.9 mg/dL — ABNORMAL HIGH (ref <1.0)

## 2019-01-22 LAB — SEDIMENTATION RATE: Sed Rate: 27 mm/hr — ABNORMAL HIGH (ref 0–20)

## 2019-01-22 LAB — CK: Total CK: 115 U/L (ref 49–397)

## 2019-01-22 NOTE — Progress Notes (Signed)
NAME: Nathan Chambers  DOB: January 06, 1966  MRN: [id number]  Date/Time: 01/22/2019 9:07 AM   Subjective:    ?Patient here as follow-up after recent surgery. Nathan Chambers is a 53 y.o. male with a history of complicated left knee history, hyperlipidemia, hypertension, anxiety and bipolar disorder is here to see me after his recent surgery for the left knee. Patient has got a complicated history.  In 2017 he underwent bilateral knee replacement in December.  He was doing well until August 2019 when the left knee got infected secondary to staph aureus and he underwent arthrotomy and irrigation and debridement and was on nafcillin for 6 weeks followed by oral antibiotics.  Then in May 2020 he had to undergo a total knee arthroplasty and removal of the hardware with an antibiotic spacer placement and again received 6 weeks of IV nafcillin.  And on 11/27/2018 he underwent total knee revision reimplantation of the left knee.  Cultures were negative then.  He has been on Keflex and rifampin since then.  But he started to collect serous fluid which was draining from the left knee and also he found that the patella was displaced to the left.  So on 01/10/2019 Dr. Rudene Christians took him for another surgery and did a left medial retinacular repair.  Cultures sent sent from the fluid as well as tissue from the knee did not grow any bacteria nor was any WBCs seen. Patient is doing fine since the last surgery.  Is been able to walk better He is pain is much better.  Swelling persist.  His staples will be removed at the end of the month and following which she will go for PT. He is taking Keflex and rifampin every day and is 100% adherent.  01/10/19: LATERAL DISLOCATION OF LEFT PATELLA, LEFT MEDIAL RETINACULAR REPAIR (Left) with lateral release open- culture negative On keflex and rifampin  7/28 /20 :Total knee revision reimplantation of left knee on 11/27/18.culture negative- on keflex and rifampin since then  09/11/2018  underwent removal of the total knee arthroplasty and had an antibiotic spacer and then took 6 weeks of IV nafcillin which he completed on 10/23/2018  09/03/2018 had aspiration of the left knee and that culture came back as staph aureus  06/08/18 underwent I/Dof the left knee with poly-exchange and tissue culture was negative  12/08/17: left knee arthrotomy and irrigation debridement and polyethylene exchange - MSSA culture - 6 weeks of IV naf followed by oral antibiotics   04/19/2016 : b/l knee replacement  Past Medical History:  Diagnosis Date  . Anxiety   . Arthritis    knees, hands  . Bipolar disorder ([place])   . Chronic kidney disease    arf 2014 due to dehydration  . Complication of anesthesia    woke up during last knee surgery feb 2020  . GERD (gastroesophageal reflux disease)    rare no meds  . Headache   . Hemorrhoids   . Hyperlipidemia   . Hypertension   . LFT elevation   . NAFLD (nonalcoholic fatty liver disease)     Past Surgical History:  Procedure Laterality Date  . ACHILLES TENDON SURGERY Right 10/30/2015   Procedure: ACHILLES TENDON REPAIR/ HEGLAND OSTECTOMY;  Surgeon: [name], DPM;  Location: [facility];  Service: Podiatry;  Laterality: Right;  . COLONOSCOPY    . COLONOSCOPY WITH PROPOFOL N/A 04/07/2017   Procedure: COLONOSCOPY WITH PROPOFOL;  Surgeon: Toledo, Benay Pike, MD;  Location: [facility];  Service: Gastroenterology;  Laterality: N/A;  .  FRACTURE SURGERY Left    MIDDLE FINGER  . HERNIA REPAIR    . I&D KNEE WITH POLY EXCHANGE Left 12/08/2017   Procedure: IRRIGATION AND DEBRIDEMENT KNEE WITH POLY EXCHANGE;  Surgeon: Dereck Leep, MD;  Location: [facility];  Service: Orthopedics;  Laterality: Left;  . JOINT REPLACEMENT Bilateral 2017   bilateral knees  . KNEE ARTHROTOMY Left 12/08/2017   Procedure: KNEE ARTHROTOMY, I&D, [place] OUT, POLY EXCHANGE;  Surgeon: Dereck Leep, MD;  Location: [facility];  Service: Orthopedics;  Laterality: Left;  . LIPOMA EXCISION     . PATELLAR TENDON REPAIR Left 01/10/2019   Procedure: LEFT MEDIAL RETINACULAR REPAIR;  Surgeon: Hessie Knows, MD;  Location: [facility];  Service: Orthopedics;  Laterality: Left;  . TOTAL KNEE ARTHROPLASTY Bilateral 04/19/2016   Procedure: TOTAL KNEE BILATERAL;  Surgeon: Hessie Knows, MD;  Location: [facility];  Service: Orthopedics;  Laterality: Bilateral;  . TOTAL KNEE ARTHROPLASTY Left 09/11/2018   Procedure: TOTAL KNEE ARTHROPLASTY - LEFT - HARDWARE REMOVAL WITH INSERTION OF ANTIBIOTIC SPACER;  Surgeon: Hessie Knows, MD;  Location: [facility];  Service: Orthopedics;  Laterality: Left;  . TOTAL KNEE REVISION Left 06/11/2018   Procedure: INCISION AND DRAINAGE OF LEFT KNEE, POLY EXCHANGE;  Surgeon: Hessie Knows, MD;  Location: [facility];  Service: Orthopedics;  Laterality: Left;  . TOTAL KNEE REVISION Left 11/27/2018   Procedure: TOTAL KNEE REVISION REIMPLANTATION OF LEFT TOTAL KNEE;  Surgeon: Hessie Knows, MD;  Location: [facility];  Service: Orthopedics;  Laterality: Left;  Marland Kitchen VASECTOMY      Social History   Socioeconomic History  . Marital status: Married    Spouse name: Not on file  . Number of children: Not on file  . Years of education: Not on file  . Highest education level: Not on file  Occupational History  . Not on file  Social Needs  . Financial resource strain: Somewhat hard  . Food insecurity    Worry: Never true    Inability: Never true  . Transportation needs    Medical: Patient refused    Non-medical: Patient refused  Tobacco Use  . Smoking status: Never Smoker  . Smokeless tobacco: Former Systems developer    Types: Chew  Substance and Sexual Activity  . Alcohol use: No  . Drug use: No  . Sexual activity: Yes  Lifestyle  . Physical activity    Days per week: 1 day    Minutes per session: 30 min  . Stress: To some extent  Relationships  . Social connections    Talks on phone: More than three times a week    Gets together: More than three times a week    Attends religious  service: Never    Active member of club or organization: No    Attends meetings of clubs or organizations: Never    Relationship status: Married  . Intimate partner violence    Fear of current or ex partner: Patient refused    Emotionally abused: Patient refused    Physically abused: Patient refused    Forced sexual activity: Patient refused  Other Topics Concern  . Not on file  Social History Narrative  . Not on file    No family history on file. Allergies  Allergen Reactions  . Etodolac Itching    Current Outpatient Medications  Medication Sig Dispense Refill  . amLODipine-benazepril (LOTREL) 10-40 MG capsule Take 1 capsule by mouth every morning.     . busPIRone (BUSPAR) 5 MG tablet Take 10  mg by mouth 2 (two) times daily.     . cephALEXin (KEFLEX) 500 MG capsule Take 1 capsule (500 mg total) by mouth 4 (four) times daily for 28 days. 112 capsule 1  . chlorthalidone (HYGROTON) 25 MG tablet Take 12.5 mg by mouth daily.    . diazepam (VALIUM) 5 MG tablet Take 5 mg by mouth 2 (two) times daily with a meal.    . DULoxetine (CYMBALTA) 60 MG capsule Take 60 mg by mouth every morning.     . folic acid (FOLVITE) Q000111Q MCG tablet Take 800 mcg by mouth daily.     Marland Kitchen gemfibrozil (LOPID) 600 MG tablet Take 600 mg by mouth 2 (two) times daily before a meal.    . meloxicam (MOBIC) 15 MG tablet Take 15 mg by mouth daily.    . methocarbamol (ROBAXIN) 500 MG tablet Take 1,000 mg by mouth every 6 (six) hours as needed for muscle spasms.     . Multiple Vitamins-Minerals (MULTIVITAMIN WITH MINERALS) tablet Take 1 tablet by mouth daily.    . multivitamin-lutein (OCUVITE-LUTEIN) CAPS capsule Take 1 capsule by mouth daily.    . naloxone (NARCAN) 0.4 MG/ML injection Inject 1 mL (0.4 mg total) into the vein as needed (respiratory rate < 10). 1 mL 0  . oxyCODONE 10 MG TABS Take 1-1.5 tablets (10-15 mg total) by mouth every 4 (four) hours as needed for severe pain (pain score 7-10). 30 tablet 0  .  PARoxetine (PAXIL) 20 MG tablet Take 60 mg by mouth daily.     . pravastatin (PRAVACHOL) 20 MG tablet Take 20 mg by mouth every evening.     . rifampin (RIFADIN) 300 MG capsule Take 1 capsule (300 mg total) by mouth every 12 (twelve) hours for 28 days. 56 capsule 1  . vitamin B-12 (CYANOCOBALAMIN) 500 MCG tablet Take 500 mcg by mouth daily.     . vitamin C (ASCORBIC ACID) 500 MG tablet Take 500 mg by mouth daily.     No current facility-administered medications for this visit.      Abtx:  Anti-infectives (From admission, onward)   None      REVIEW OF SYSTEMS:  Const: negative fever, negative chills, negative weight loss Eyes: negative diplopia or visual changes, negative eye pain ENT: negative coryza, negative sore throat Resp: negative cough, hemoptysis, dyspnea Cards: negative for chest pain, palpitations, lower extremity edema GU: negative for frequency, dysuria and hematuria GI: Negative for abdominal pain, diarrhea, bleeding, constipation Skin: negative for rash and pruritus Heme: negative for easy bruising and gum/nose bleeding MS: As above Neurolo:negative for headaches, dizziness, vertigo, memory problems  Psych: Anxiety Endocrine: No polyuria or polydipsia allergy/Immunology-as above Objective:  VITALS:  BP (!) 146/88 (BP Location: Right Arm, Patient Position: Sitting, Cuff Size: Large)   Pulse 83   Temp (!) 97 F (36.1 C) (Oral)   Wt (!) 330 lb (149.7 kg) Comment: unable  BMI 41.25 kg/m  PHYSICAL EXAM:  General: Alert, cooperative, no distress, appears stated age.  Head: Normocephalic, without obvious abnormality, atraumatic. Eyes: Conjunctivae clear, anicteric sclerae. Pupils are equal ENT not examined due to mask Neck: Supple, symmetrical, no adenopathy, thyroid: non tender no carotid bruit and no JVD. Back: No CVA tenderness. Lungs: Clear to auscultation bilaterally. No Wheezing or Rhonchi. No rales. Heart: Regular rate and rhythm, no murmur, rub or  gallop. Abdomen: Soft, non-tender,not distended. Bowel sounds normal. No masses Extremities: Left knee swollen and warm.  Surgical dressing in place Right knee scar  Skin: No rashes or lesions. Or bruising Lymph: Cervical, supraclavicular normal. Neurologic: Grossly non-focal Pertinent Labs Lab Results CBC    Component Value Date/Time   WBC 9.2 12/20/2018 0956   RBC 4.66 12/20/2018 0956   HGB 13.2 12/20/2018 0956   HGB 13.9 01/06/2013 0424   HCT 40.7 12/20/2018 0956   HCT 39.4 (L) 01/06/2013 0424   PLT 457 (H) 12/20/2018 0956   PLT 254 01/06/2013 0424   MCV 87.3 12/20/2018 0956   MCV 91 01/06/2013 0424   MCH 28.3 12/20/2018 0956   MCHC 32.4 12/20/2018 0956   RDW 12.5 12/20/2018 0956   RDW 13.4 01/06/2013 0424   LYMPHSABS 1.7 12/20/2018 0956   LYMPHSABS 2.1 01/06/2013 0424   MONOABS 0.6 12/20/2018 0956   MONOABS 0.7 01/06/2013 0424   EOSABS 0.9 (H) 12/20/2018 0956   EOSABS 0.3 01/06/2013 0424   BASOSABS 0.1 12/20/2018 0956   BASOSABS 0.1 01/06/2013 0424    CMP Latest Ref Rng & Units 12/20/2018 12/02/2018 11/30/2018  Glucose 70 - 99 mg/dL 154(H) 129(H) 132(H)  BUN 6 - 20 mg/dL 10 <5(L) 8  Creatinine 0.61 - 1.24 mg/dL 0.82 0.75 0.78  Sodium 135 - 145 mmol/L 136 135 135  Potassium 3.5 - 5.1 mmol/L 3.8 3.1(L) 3.8  Chloride 98 - 111 mmol/L 102 98 98  CO2 22 - 32 mmol/L 24 24 28   Calcium 8.9 - 10.3 mg/dL 9.4 9.0 9.2  Total Protein 6.5 - 8.1 g/dL 7.6 - -  Total Bilirubin 0.3 - 1.2 mg/dL 0.5 - -  Alkaline Phos 38 - 126 U/L 112 - -  AST 15 - 41 U/L 43(H) - -  ALT 0 - 44 U/L 44 - -     ? Impression/Recommendation ? 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  Repeat cultures have been negative.  Patient is currently on suppressive therapy with Keflex and rifampin. He recently underwent a left medial retinacular repair on 01/10/2019 for a displaced patella.  Cultures  again are negative.  He will continue with Keflex and and rifampin for a total of 3-6 months and then will go on doxycycline 100 mg p.o. twice daily indefinitely.  We will do labs today.  Right TKA in 2017.  Hypertension on amlodipine and Benzapril chlorthalidone.  Hyperlipidemia on Pravachol and gemfibrozil  Anxiety and bipolar on BuSpar, doxepin, duloxetine, paroxetine and as needed Ambien  Follow-up in 10 weeks.  ? ___________________________________________________ Discussed with patient, in great detail. Note:  This document was prepared using Dragon voice recognition software and may include unintentional dictation errors.

## 2019-01-22 NOTE — Patient Instructions (Addendum)
You are here for follow up for the left prosrhetic joint infection and surgeries- your last 2 cultures have been negative.you are currently on keflex 500mg  every 6 hours and rifampin every 12 hours-will continue this regimen for until end of October and then may consider Doxy which is twice a day- Will get labs today Follow up 2 months

## 2019-02-01 ENCOUNTER — Other Ambulatory Visit: Payer: Self-pay

## 2019-02-01 ENCOUNTER — Ambulatory Visit (INDEPENDENT_AMBULATORY_CARE_PROVIDER_SITE_OTHER): Payer: 59 | Admitting: Psychiatry

## 2019-02-01 ENCOUNTER — Encounter: Payer: Self-pay | Admitting: Psychiatry

## 2019-02-01 DIAGNOSIS — F410 Panic disorder [episodic paroxysmal anxiety] without agoraphobia: Secondary | ICD-10-CM | POA: Insufficient documentation

## 2019-02-01 DIAGNOSIS — Z008 Encounter for other general examination: Secondary | ICD-10-CM | POA: Diagnosis not present

## 2019-02-01 DIAGNOSIS — G894 Chronic pain syndrome: Secondary | ICD-10-CM | POA: Diagnosis not present

## 2019-02-01 NOTE — Progress Notes (Signed)
Virtual Visit via Video Note  I connected with Nathan Chambers on 02/01/19 at  9:00 AM EDT by a video enabled telemedicine application and verified that I am speaking with the correct person using two identifiers.   I discussed the limitations of evaluation and management by telemedicine and the availability of in person appointments. The patient expressed understanding and agreed to proceed.   I discussed the assessment and treatment plan with the patient. The patient was provided an opportunity to ask questions and all were answered. The patient agreed with the plan and demonstrated an understanding of the instructions.   The patient was advised to call back or seek an in-person evaluation if the symptoms worsen or if the condition fails to improve as anticipated.   Psychiatric Initial Adult Assessment   Patient Identification: Nathan Chambers MRN:  [id number] Date of Evaluation:  02/01/2019 Referral Source: Dr.Bilal Lateef Chief Complaint:   Chief Complaint    Psychiatric Evaluation     Visit Diagnosis:    ICD-10-CM   1. Evaluation by psychiatric service required  Z00.8   2. Chronic pain syndrome  G89.4   3. Panic attacks  F41.0     History of Present Illness: [name] is a 53 year old Caucasian male, married, currently on disability, lives in [place], has a history of anxiety disorder, chronic back pain, bilateral foot pain, knee pain, status post knee surgery, status post total knee arthroplasty, total knee revision left, gastroesophageal reflux disease, hyperlipidemia, hypertension, was evaluated by telemedicine today.  Patient was referred to the clinic for routine assessment of possible mental health/substance abuse risk potential prior to initiation of pain management by his pain provider.  Patient today reports that he has been struggling with pain since the past 2 to 2-1/2 years.  He reports he has had several surgeries after that.  Patient reports he had a double knee  replacement in 0000000, he had complications S/P knee replacement , it got infected and in 2019 he had multiple surgeries.  He had a left-sided knee revision surgery in February 2020.  He reports he was wheelchair-bound for a while at that time.  Patient reports he is currently making some progress with that and is currently able to walk with a walker or cane.  He however continues to struggle with pain.  He reports he was referred to pain management to get his pain under control.  Patient does report a history of anxiety symptoms.  He reports due to his physical problems as well as his pain he had to stop working.  That made him very anxious.  He describes his anxiety symptoms as having shortness of breath, being very nervous, chest tightness , used to happen 2-3 times a day when it began.  He reports he started taking Paxil and BuSpar prescribed by his primary care provider however he continued to have panic symptoms 2-3 times a day initially.  He reports he hence was started on diazepam which he takes daily at this time.  He reports ever since being on the diazepam his anxiety symptoms are under control.  Patient denies any symptoms of depression at this time.  He denies any suicidality or homicidality.  He does report sleep problems however reports it is mostly due to his pain.  Patient denies any history of trauma.  Patient denies any substance abuse problems.  Patient does have good support system from his family. Associated Signs/Symptoms: Depression Symptoms:  depressed mood, insomnia, anxiety, panic attacks, - currently  stable on medications (Hypo) Manic Symptoms:  Denies Anxiety Symptoms:  Panic Symptoms,stable on medications Psychotic Symptoms:  Denies  PTSD Symptoms: Negative  Past Psychiatric History: Patient denies inpatient mental health admission for psychiatric reasons in the past.  Patient denies suicide attempts.  Patient however has a history of anxiety disorder which is  currently being managed by his primary care provider.  Previous Psychotropic Medications: Yes  Cymbalta-for pain, Paxil, BuSpar, Valium  Substance Abuse History in the last 12 months:  No.  Consequences of Substance Abuse: Negative  Past Medical History:  Past Medical History:  Diagnosis Date  . Anxiety   . Arthritis    knees, hands  . Bipolar disorder ([place])   . Chronic kidney disease    arf 2014 due to dehydration  . Complication of anesthesia    woke up during last knee surgery feb 2020  . GERD (gastroesophageal reflux disease)    rare no meds  . Headache   . Hemorrhoids   . Hyperlipidemia   . Hypertension   . LFT elevation   . NAFLD (nonalcoholic fatty liver disease)     Past Surgical History:  Procedure Laterality Date  . ACHILLES TENDON SURGERY Right 10/30/2015   Procedure: ACHILLES TENDON REPAIR/ HEGLAND OSTECTOMY;  Surgeon: [name], DPM;  Location: [facility];  Service: Podiatry;  Laterality: Right;  . COLONOSCOPY    . COLONOSCOPY WITH PROPOFOL N/A 04/07/2017   Procedure: COLONOSCOPY WITH PROPOFOL;  Surgeon: Toledo, Benay Pike, MD;  Location: [facility];  Service: Gastroenterology;  Laterality: N/A;  . FRACTURE SURGERY Left    MIDDLE FINGER  . HERNIA REPAIR    . I&D KNEE WITH POLY EXCHANGE Left 12/08/2017   Procedure: IRRIGATION AND DEBRIDEMENT KNEE WITH POLY EXCHANGE;  Surgeon: Dereck Leep, MD;  Location: [facility];  Service: Orthopedics;  Laterality: Left;  . JOINT REPLACEMENT Bilateral 2017   bilateral knees  . KNEE ARTHROTOMY Left 12/08/2017   Procedure: KNEE ARTHROTOMY, I&D, [place] OUT, POLY EXCHANGE;  Surgeon: Dereck Leep, MD;  Location: [facility];  Service: Orthopedics;  Laterality: Left;  . LIPOMA EXCISION    . PATELLAR TENDON REPAIR Left 01/10/2019   Procedure: LEFT MEDIAL RETINACULAR REPAIR;  Surgeon: Hessie Knows, MD;  Location: [facility];  Service: Orthopedics;  Laterality: Left;  . TOTAL KNEE ARTHROPLASTY Bilateral 04/19/2016   Procedure: TOTAL  KNEE BILATERAL;  Surgeon: Hessie Knows, MD;  Location: [facility];  Service: Orthopedics;  Laterality: Bilateral;  . TOTAL KNEE ARTHROPLASTY Left 09/11/2018   Procedure: TOTAL KNEE ARTHROPLASTY - LEFT - HARDWARE REMOVAL WITH INSERTION OF ANTIBIOTIC SPACER;  Surgeon: Hessie Knows, MD;  Location: [facility];  Service: Orthopedics;  Laterality: Left;  . TOTAL KNEE REVISION Left 06/11/2018   Procedure: INCISION AND DRAINAGE OF LEFT KNEE, POLY EXCHANGE;  Surgeon: Hessie Knows, MD;  Location: [facility];  Service: Orthopedics;  Laterality: Left;  . TOTAL KNEE REVISION Left 11/27/2018   Procedure: TOTAL KNEE REVISION REIMPLANTATION OF LEFT TOTAL KNEE;  Surgeon: Hessie Knows, MD;  Location: [facility];  Service: Orthopedics;  Laterality: Left;  Marland Kitchen VASECTOMY      Family Psychiatric History: Patient does report history of alcoholism in his maternal grandfather.  He denies any other mental health problems in his family.  Family History:  Family History  Problem Relation Age of Onset  . Alcohol abuse Maternal Grandfather   . Mental illness Neg Hx     Social History:   Social History   Socioeconomic History  . Marital  status: Married    Spouse name: Not on file  . Number of children: 1  . Years of education: High school  . Highest education level: Not on file  Occupational History  . Not on file  Social Needs  . Financial resource strain: Somewhat hard  . Food insecurity    Worry: Never true    Inability: Never true  . Transportation needs    Medical: Patient refused    Non-medical: Patient refused  Tobacco Use  . Smoking status: Never Smoker  . Smokeless tobacco: Former Systems developer    Types: Chew  Substance and Sexual Activity  . Alcohol use: No  . Drug use: No  . Sexual activity: Yes  Lifestyle  . Physical activity    Days per week: 1 day    Minutes per session: 30 min  . Stress: To some extent  Relationships  . Social connections    Talks on phone: More than three times a week    Gets  together: More than three times a week    Attends religious service: Never    Active member of club or organization: No    Attends meetings of clubs or organizations: Never    Relationship status: Married  Other Topics Concern  . Not on file  Social History Narrative  . Not on file    Additional Social History: Patient was raised by both parents.  He graduated high school.  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  He however is currently unable to work and is on disability.  Patient is married.  He lives with his wife in [place].  He has 1 daughter who is 61 years old.  Patient denies any history of legal problems.  Patient denies any history of trauma.  Allergies:   Allergies  Allergen Reactions  . Etodolac Itching    Metabolic Disorder Labs: Lab Results  Component Value Date   HGBA1C 6.0 01/06/2013   No results found for: PROLACTIN Lab Results  Component Value Date   CHOL 149 01/06/2013   TRIG 452 (H) 01/06/2013   HDL 27 (L) 01/06/2013   VLDL SEE COMMENT 01/06/2013   LDLCALC SEE COMMENT 01/06/2013   Lab Results  Component Value Date   TSH 0.89 01/06/2013    Therapeutic Level Labs: No results found for: LITHIUM No results found for: CBMZ No results found for: VALPROATE  Current Medications: Current Outpatient Medications  Medication Sig Dispense Refill  . amLODipine-benazepril (LOTREL) 10-40 MG capsule Take 1 capsule by mouth every morning.     . busPIRone (BUSPAR) 5 MG tablet Take 10 mg by mouth 2 (two) times daily.     . cephALEXin (KEFLEX) 500 MG capsule Take by mouth.    . chlorthalidone (HYGROTON) 25 MG tablet Take 12.5 mg by mouth daily.    . cyclobenzaprine (FLEXERIL) 10 MG tablet Take by mouth.    . diazepam (VALIUM) 5 MG tablet Take 5 mg by mouth 2 (two) times daily with a meal.    . DULoxetine (CYMBALTA) 60 MG capsule Take 60 mg by mouth every morning.     . folic acid (FOLVITE) Q000111Q MCG tablet Take 800 mcg by mouth daily.     Marland Kitchen gemfibrozil (LOPID) 600 MG tablet  Take 600 mg by mouth 2 (two) times daily before a meal.    . meloxicam (MOBIC) 15 MG tablet Take 15 mg by mouth daily.    . methocarbamol (ROBAXIN) 500 MG tablet Take 1,000 mg by mouth every 6 (  six) hours as needed for muscle spasms.     . Multiple Vitamins-Minerals (MULTIVITAMIN WITH MINERALS) tablet Take 1 tablet by mouth daily.    . multivitamin-lutein (OCUVITE-LUTEIN) CAPS capsule Take 1 capsule by mouth daily.    . naloxone (NARCAN) 0.4 MG/ML injection Inject 1 mL (0.4 mg total) into the vein as needed (respiratory rate < 10). 1 mL 0  . oxyCODONE 10 MG TABS Take 1-1.5 tablets (10-15 mg total) by mouth every 4 (four) hours as needed for severe pain (pain score 7-10). 30 tablet 0  . PARoxetine (PAXIL) 20 MG tablet Take 60 mg by mouth daily.     . pravastatin (PRAVACHOL) 20 MG tablet Take 20 mg by mouth every evening.     . ropinirole (REQUIP) 5 MG tablet Take 10 mg by mouth 2 (two) times daily.    . vitamin B-12 (CYANOCOBALAMIN) 500 MCG tablet Take 500 mcg by mouth daily.     . vitamin C (ASCORBIC ACID) 500 MG tablet Take 500 mg by mouth daily.    . rifampin (RIFADIN) 300 MG capsule      No current facility-administered medications for this visit.     Musculoskeletal: Strength & Muscle Tone: UTA Gait & Station: Walks with cane  Patient leans: N/A  Psychiatric Specialty Exam: Review of Systems  Musculoskeletal: Positive for back pain and joint pain.  Psychiatric/Behavioral: Negative for depression, hallucinations, substance abuse and suicidal ideas. The patient has insomnia. The patient is not nervous/anxious.   All other systems reviewed and are negative.   There were no vitals taken for this visit.There is no height or weight on file to calculate BMI.  General Appearance: Casual  Eye Contact:  Fair  Speech:  Clear and Coherent  Volume:  Normal  Mood:  Euthymic  Affect:  Congruent  Thought Process:  Goal Directed and Descriptions of Associations: Intact  Orientation:  Full  (Time, Place, and Person)  Thought Content:  Logical  Suicidal Thoughts:  No  Homicidal Thoughts:  No  Memory:  Immediate;   Fair Recent;   Fair Remote;   Fair  Judgement:  Fair  Insight:  Fair  Psychomotor Activity:  Normal  Concentration:  Concentration: Fair and Attention Span: Fair  Recall:  [employer] of Knowledge:Fair  Language: Fair  Akathisia:  No  Handed:  Ambidextrous  AIMS (if indicated):  UTA  Assets:  Communication Skills Desire for Improvement Housing Intimacy Social Support Talents/Skills  ADL's:  Intact  Cognition: WNL  Sleep:  restless due to pain   Screenings:   Assessment and Plan: Mr. Alvarez Paccione is a 53 year old Caucasian male, married, currently on disability, lives in [place], has a history of anxiety disorder, chronic back pain, bilateral foot pain, knee pain, status post knee surgery, status post total knee arthroplasty, total knee revision left, gastroesophageal reflux disease, hyperlipidemia, hypertension was evaluated by telemedicine today.  Patient was referred to the clinic for routine assessment of possible mental health/substance abuse risk potential prior to initiation of pain management by his pain provider.  The following instruments were used Clinical interview Screener and opioid assessment for patients with pain/revised Opioid risk tool Drug abuse screening test Alcohol use disorder identification test PHQ 9 GAD 7  Based on clinical interview and instrument used at the time of evaluation the risk is determined to be low to moderate.  Anxiety disorder unspecified/Panic attacks - patient does report history of panic attacks as well as anxiety in general due to his situational stressors including  his pain as well as his inability to work.  Patient's anxiety is currently being managed by his primary care provider and he is on Valium and Paxil for the same.  Discussed with patient to continue to follow-up with his primary care provider for  the same.  Also advised possibly starting psychotherapy sessions like cognitive behavioral therapy . Discussed with patient the risk of being on long-term benzodiazepine therapy.  I have spent atleast 60 minutes non face to face with patient today. More than 50 % of the time was spent for psychoeducation and supportive psychotherapy and care coordination. This note was generated in part or whole with voice recognition software. Voice recognition is usually quite accurate but there are transcription errors that can and very often do occur. I apologize for any typographical errors that were not detected and corrected.      Ursula Alert, MD 10/2/202011:26 AM

## 2019-02-12 ENCOUNTER — Ambulatory Visit
Payer: 59 | Attending: Student in an Organized Health Care Education/Training Program | Admitting: Student in an Organized Health Care Education/Training Program

## 2019-02-12 ENCOUNTER — Telehealth: Payer: Self-pay

## 2019-02-12 ENCOUNTER — Encounter: Payer: Self-pay | Admitting: Student in an Organized Health Care Education/Training Program

## 2019-02-12 ENCOUNTER — Other Ambulatory Visit: Payer: Self-pay

## 2019-02-12 VITALS — BP 160/108 | HR 72 | Temp 98.1°F | Resp 18 | Ht 75.0 in | Wt 335.0 lb

## 2019-02-12 DIAGNOSIS — G894 Chronic pain syndrome: Secondary | ICD-10-CM | POA: Diagnosis present

## 2019-02-12 DIAGNOSIS — Z96653 Presence of artificial knee joint, bilateral: Secondary | ICD-10-CM | POA: Diagnosis present

## 2019-02-12 DIAGNOSIS — G608 Other hereditary and idiopathic neuropathies: Secondary | ICD-10-CM | POA: Diagnosis present

## 2019-02-12 DIAGNOSIS — T8454XD Infection and inflammatory reaction due to internal left knee prosthesis, subsequent encounter: Secondary | ICD-10-CM | POA: Diagnosis present

## 2019-02-12 DIAGNOSIS — M47816 Spondylosis without myelopathy or radiculopathy, lumbar region: Secondary | ICD-10-CM | POA: Insufficient documentation

## 2019-02-12 DIAGNOSIS — G8929 Other chronic pain: Secondary | ICD-10-CM | POA: Insufficient documentation

## 2019-02-12 DIAGNOSIS — M5416 Radiculopathy, lumbar region: Secondary | ICD-10-CM | POA: Insufficient documentation

## 2019-02-12 MED ORDER — BELBUCA 300 MCG BU FILM: 1.0000 | ORAL_FILM | Freq: Two times a day (BID) | BUCCAL | 1 refills | Status: AC | PRN

## 2019-02-12 NOTE — Telephone Encounter (Signed)
PA done, pt notified.

## 2019-02-12 NOTE — Progress Notes (Signed)
Patient's Name: Nathan Chambers  MRN: [id number]  Referring Provider: Juluis Pitch, MD  DOB: 07/22/1965  PCP: Juluis Pitch, MD  DOS: 02/12/2019  Note by: Gillis Santa, MD  Service setting: Ambulatory outpatient  Attending: Gillis Santa, MD  Location: [facility] (AMB) Pain Management Facility  Specialty: Interventional Pain Management  Patient type: Established   Primary Reason(s) for Visit: Encounter for prescription drug management. (Level of risk: moderate)  CC: Back Pain (low) and Leg Pain  HPI  Nathan Chambers is a 53 y.o. year old, male patient, who comes today for a medication management evaluation. He has Arthritis, septic, knee ([place]); Anxiety; Hyperglycemia, unspecified; Hyperlipidemia; Hypertension; Infection of total left knee replacement ([place]); Chronic radicular lumbar pain; History of bilateral knee replacement; Lumbar radicular pain; Lumbar facet arthropathy; Spinal stenosis of lumbar region without neurogenic claudication; Lumbar spondylosis; Neuroforaminal stenosis of lumbar spine; Sensory polyneuropathy; S/P revision of total knee, left; Status post revision of total knee; Chronic pain syndrome; Evaluation by psychiatric service required; BMI 40.0-44.9, adult (HCC); and Panic attacks on their problem list. His primarily concern today is the Back Pain (low) and Leg Pain  Pain Assessment: Location: Lower Back Radiating: radiates down side of left leg Onset: More than a month ago Duration: Chronic pain Quality: Burning, Discomfort, Sore, Constant Severity: 6 /10 (subjective, self-reported pain score)  Note: Reported level is inconsistent with clinical observations.                         When using our objective Pain Scale, levels between 6 and 10/10 are said to belong in an emergency room, as it progressively worsens from a 6/10, described as severely limiting, requiring emergency care not usually available at an outpatient pain management facility. At a 6/10 level, communication  becomes difficult and requires great effort. Assistance to reach the emergency department may be required. Facial flushing and profuse sweating along with potentially dangerous increases in heart rate and blood pressure will be evident. Effect on ADL: Limits activities Timing: Constant Modifying factors: medications BP: (!) 160/108  HR: 72  Nathan Chambers was last scheduled for an appointment on 12/27/2018 for medication management. During today's appointment we reviewed Nathan Chambers's chronic pain status, as well as his outpatient medication regimen.  The patient  reports no history of drug use. His body mass index is 41.87 kg/m.  Further details on both, my assessment(s), as well as the proposed treatment plan, please see below.  Continues on antibiotics for his hx of left knee infection (over the last 2 years).  Has been seen and evaluated by psychiatry.  We discussed chronic opioid medication options.  I informed the patient that given his weight and his psych assessment, we will focus on mixed opioid antagonist/agonist such as buprenorphine.  We discussed belbuca which is a buccal film which did provide long-acting pain control and has less abuse and side effect profile then your direct acting opioid agonist.   Controlled Substance Pharmacotherapy Assessment REMS (Risk Evaluation and Mitigation Strategy)  Analgesic: We will start belbuca as below Dewayne Shorter, RN  02/12/2019  8:06 AM  Signed Safety precautions to be maintained throughout the outpatient stay will include: orient to surroundings, keep bed in low position, maintain call bell within reach at all times, provide assistance with transfer out of bed and ambulation.    Monitoring: [place] PMP: PDMP not reviewed this encounter.  Repeat urine toxicology screen today Last UDS on record: No results found for: SUMMARY  Risk Assessment Profile: Aberrant behavior: See initial evaluations. None observed or detected today Comorbid factors  increasing risk of overdose: See initial evaluation. No additional risks detected today Opioid risk tool (ORT):  Opioid Risk  12/27/2018  Alcohol 3  Illegal Drugs 0  Rx Drugs 0  Alcohol -  Illegal Drugs 0  Rx Drugs 0  Age between 16-45 years  0  Psychological Disease 2  ADD Negative  OCD Negative  Bipolar Negative  Depression 1  Opioid Risk Tool Scoring 6  Opioid Risk Interpretation Moderate Risk    ORT Scoring interpretation table:  Score <3 = Low Risk for SUD  Score between 4-7 = Moderate Risk for SUD  Score >8 = High Risk for Opioid Abuse   Risk of substance use disorder (SUD): Low  Risk Mitigation Strategies:  Patient Counseling: Covered Patient-Prescriber Agreement (PPA): Present and active  Notification to other healthcare providers: Done  Pharmacologic Plan: Today we will take over the chronic pain medication management and from this point on our medication agreement with this patient is active.            Repeat urine toxicology screen as there is not one on file.  Belbuca as below. Laboratory Chemistry Profile   Screening Lab Results  Component Value Date   SARSCOV2NAA NEGATIVE 01/08/2019   COVIDSOURCE NASOPHARYNGEAL 10/17/2018   STAPHAUREUS NEGATIVE 11/19/2018   MRSAPCR NEGATIVE 11/19/2018   HIV Non Reactive 12/09/2017    Inflammation (CRP: Acute Phase) (ESR: Chronic Phase) Lab Results  Component Value Date   CRP 1.9 (H) 01/22/2019   ESRSEDRATE 27 (H) 01/22/2019                         Rheumatology No results found for: RF, ANA, LABURIC, URICUR, LYMEIGGIGMAB, LYMEABIGMQN, HLAB27                      Renal Lab Results  Component Value Date   BUN 9 01/22/2019   CREATININE 0.75 01/22/2019   GFRAA >60 01/22/2019   GFRNONAA >60 01/22/2019                             Hepatic Lab Results  Component Value Date   AST 45 (H) 01/22/2019   ALT 38 01/22/2019   ALBUMIN 4.3 01/22/2019   ALKPHOS 108 01/22/2019                        Electrolytes Lab  Results  Component Value Date   NA 139 01/22/2019   K 3.9 01/22/2019   CL 102 01/22/2019   CALCIUM 9.5 01/22/2019   MG 2.4 01/06/2013                        Neuropathy Lab Results  Component Value Date   HGBA1C 6.0 01/06/2013   HIV Non Reactive 12/09/2017                        CNS No results found for: COLORCSF, APPEARCSF, RBCCOUNTCSF, WBCCSF, POLYSCSF, LYMPHSCSF, EOSCSF, PROTEINCSF, GLUCCSF, JCVIRUS, CSFOLI, IGGCSF, LABACHR, ACETBL                      Bone No results found for: [place], [id number], [id number], [id number], 25OHVITD1, 25OHVITD2, 25OHVITD3, TESTOFREE, TESTOSTERONE  Coagulation Lab Results  Component Value Date   INR 1.0 11/19/2018   LABPROT 13.3 11/19/2018   APTT 31 11/19/2018   PLT 351 01/22/2019                        Cardiovascular Lab Results  Component Value Date   CKTOTAL 115 01/22/2019   TROPONINI <0.03 12/08/2017   HGB 14.2 01/22/2019   HCT 43.8 01/22/2019                         ID Lab Results  Component Value Date   HIV Non Reactive 12/09/2017   SARSCOV2NAA NEGATIVE 01/08/2019   STAPHAUREUS NEGATIVE 11/19/2018   MRSAPCR NEGATIVE 11/19/2018    Cancer No results found for: CEA, CA125, LABCA2                      Endocrine Lab Results  Component Value Date   TSH 0.89 01/06/2013                        Note: Lab results reviewed.  Recent Diagnostic Imaging Results  DG Knee 1-2 Views Left CLINICAL DATA:  Status post left knee replacement.  EXAM: LEFT KNEE - 1-2 VIEW  COMPARISON:  09/11/2018  FINDINGS: Status post left knee total arthroplasty with expected overlying postop changes and knee joint effusion. No evidence of perihardware fracture or dislocation. Antibiotic packing beads, predominantly about the distal femur.  IMPRESSION: Status post left knee total arthroplasty with expected overlying postop changes and knee joint effusion. No evidence of perihardware fracture or dislocation. Antibiotic  packing beads, predominantly about the distal femur.  Electronically Signed   By: Eddie Candle M.D.   On: 11/27/2018 11:31  Complexity Note: Imaging results reviewed. Results shared with Nathan Chambers, using Layman's terms.                               Meds   Current Outpatient Medications:  .  amLODipine-benazepril (LOTREL) 10-40 MG capsule, Take 1 capsule by mouth every morning. , Disp: , Rfl:  .  busPIRone (BUSPAR) 5 MG tablet, Take 10 mg by mouth 2 (two) times daily. , Disp: , Rfl:  .  cephALEXin (KEFLEX) 500 MG capsule, Take by mouth., Disp: , Rfl:  .  chlorthalidone (HYGROTON) 25 MG tablet, Take 12.5 mg by mouth daily., Disp: , Rfl:  .  diazepam (VALIUM) 5 MG tablet, Take 5 mg by mouth 2 (two) times daily with a meal., Disp: , Rfl:  .  DULoxetine (CYMBALTA) 60 MG capsule, Take 60 mg by mouth every morning. , Disp: , Rfl:  .  folic acid (FOLVITE) 161 MCG tablet, Take 800 mcg by mouth daily. , Disp: , Rfl:  .  gemfibrozil (LOPID) 600 MG tablet, Take 600 mg by mouth 2 (two) times daily before a meal., Disp: , Rfl:  .  meloxicam (MOBIC) 15 MG tablet, Take 15 mg by mouth daily., Disp: , Rfl:  .  Multiple Vitamins-Minerals (MULTIVITAMIN WITH MINERALS) tablet, Take 1 tablet by mouth daily., Disp: , Rfl:  .  multivitamin-lutein (OCUVITE-LUTEIN) CAPS capsule, Take 1 capsule by mouth daily., Disp: , Rfl:  .  naloxone (NARCAN) 0.4 MG/ML injection, Inject 1 mL (0.4 mg total) into the vein as needed (respiratory rate < 10)., Disp: 1 mL, Rfl: 0 .  PARoxetine (PAXIL) 20 MG  tablet, Take 60 mg by mouth daily. , Disp: , Rfl:  .  pravastatin (PRAVACHOL) 20 MG tablet, Take 20 mg by mouth every evening. , Disp: , Rfl:  .  rifampin (RIFADIN) 300 MG capsule, , Disp: , Rfl:  .  rOPINIRole (REQUIP) 0.5 MG tablet, , Disp: , Rfl:  .  ropinirole (REQUIP) 5 MG tablet, Take 10 mg by mouth 2 (two) times daily., Disp: , Rfl:  .  vitamin B-12 (CYANOCOBALAMIN) 500 MCG tablet, Take 500 mcg by mouth daily. , Disp: ,  Rfl:  .  vitamin C (ASCORBIC ACID) 500 MG tablet, Take 500 mg by mouth daily., Disp: , Rfl:  .  Buprenorphine HCl (BELBUCA) 300 MCG FILM, Place 1 Film inside cheek every 12 (twelve) hours as needed., Disp: 60 each, Rfl: 1  ROS  Constitutional: Denies any fever or chills Gastrointestinal: No reported hemesis, hematochezia, vomiting, or acute GI distress Musculoskeletal: Denies any acute onset joint swelling, redness, loss of ROM, or weakness Neurological: No reported episodes of acute onset apraxia, aphasia, dysarthria, agnosia, amnesia, paralysis, loss of coordination, or loss of consciousness  Allergies  Nathan Chambers is allergic to etodolac.  [place]  Drug: Nathan Chambers  reports no history of drug use. Alcohol:  reports no history of alcohol use. Tobacco:  reports that he has never smoked. He quit smokeless tobacco use about 7 years ago.  His smokeless tobacco use included chew. Medical:  has a past medical history of Anxiety, Arthritis, Bipolar disorder ([place]), Chronic kidney disease, Complication of anesthesia, GERD (gastroesophageal reflux disease), Headache, Hemorrhoids, Hyperlipidemia, Hypertension, LFT elevation, and NAFLD (nonalcoholic fatty liver disease). Surgical: Nathan Chambers  has a past surgical history that includes Colonoscopy; Fracture surgery (Left); Hernia repair; Lipoma excision; Vasectomy; Achilles tendon surgery (Right, 10/30/2015); Total knee arthroplasty (Bilateral, 04/19/2016); Colonoscopy with propofol (N/A, 04/07/2017); Knee arthrotomy (Left, 12/08/2017); I&D knee with poly exchange (Left, 12/08/2017); Total knee revision (Left, 06/11/2018); Joint replacement (Bilateral, 2017); Total knee arthroplasty (Left, 09/11/2018); Total knee revision (Left, 11/27/2018); and Patellar tendon repair (Left, 01/10/2019). Family: family history includes Alcohol abuse in his maternal grandfather.  Constitutional Exam  General appearance: Well nourished, well developed, and well hydrated. In no apparent  acute distress Vitals:   02/12/19 0758 02/12/19 0801  BP:  (!) 160/108  Pulse: 72   Resp: 18   Temp: 98.1 F (36.7 C)   SpO2: 97%   Weight: (!) 335 lb (152 kg)   Height: '6\' 3"'$  (1.905 m)    BMI Assessment: Estimated body mass index is 41.87 kg/m as calculated from the following:   Height as of this encounter: '6\' 3"'$  (1.905 m).   Weight as of this encounter: 335 lb (152 kg).  BMI interpretation table: BMI level Category Range association with higher incidence of chronic pain  <18 kg/m2 Underweight   18.5-24.9 kg/m2 Ideal body weight   25-29.9 kg/m2 Overweight Increased incidence by 20%  30-34.9 kg/m2 Obese (Class I) Increased incidence by 68%  35-39.9 kg/m2 Severe obesity (Class II) Increased incidence by 136%  >40 kg/m2 Extreme obesity (Class III) Increased incidence by 254%   Patient's current BMI Ideal Body weight  Body mass index is 41.87 kg/m. Ideal body weight: 84.5 kg (186 lb 4.6 oz) Adjusted ideal body weight: 111.5 kg (245 lb 12.4 oz)   BMI Readings from Last 4 Encounters:  02/12/19 41.87 kg/m  01/22/19 41.25 kg/m  01/10/19 41.88 kg/m  01/08/19 41.92 kg/m   Wt Readings from Last 4 Encounters:  02/12/19 (!) 335  lb (152 kg)  01/22/19 (!) 330 lb (149.7 kg)  01/10/19 (!) 335 lb 1.6 oz (152 kg)  01/08/19 (!) 335 lb 6.4 oz (152.1 kg)  Psych/Mental status: Alert, oriented x 3 (person, place, & time)       Eyes: PERLA Respiratory: No evidence of acute respiratory distress  Cervical Spine Area Exam  Skin & Axial Inspection: No masses, redness, edema, swelling, or associated skin lesions Alignment: Symmetrical Functional ROM: Unrestricted ROM      Stability: No instability detected Muscle Tone/Strength: Functionally intact. No obvious neuro-muscular anomalies detected. Sensory (Neurological): Unimpaired Palpation: No palpable anomalies             Lumbar Spine Area Exam  Skin & Axial Inspection: No masses, redness, or swelling Alignment: Scoliosis  detected Functional ROM: Decreased ROM affecting both sides Stability: No instability detected Muscle Tone/Strength: Functionally intact. No obvious neuro-muscular anomalies detected. Sensory (Neurological): Dermatomal pain pattern left L3, L4 Palpation: Complains of area being tender to palpation Left Fist Percussion Test Provocative Tests: Hyperextension/rotation test: (+) bilaterally for facet joint pain. Lumbar quadrant test (Kemp's test): (+) on the left for foraminal stenosis Lateral bending test: (+) due to pain. Patrick's Maneuver: (+) for left-sided S-I arthralgia             FABER* test: (+) for left-sided S-I arthralgia             S-I anterior distraction/compression test: deferred today         S-I lateral compression test: deferred today         S-I Thigh-thrust test: deferred today         S-I Gaenslen's test: deferred today         *(Flexion, ABduction and External Rotation)  Gait & Posture Assessment  Ambulation: Limited Gait: Limited. Using assistive device to ambulate Posture: Difficulty standing up straight, due to pain   Lower Extremity Exam    Side: Right lower extremity  Side: Left lower extremity  Stability: No instability observed          Stability: No instability observed          Skin & Extremity Inspection: Evidence of prior arthroplastic surgery  Skin & Extremity Inspection: Evidence of prior arthroplastic surgery surgical sutures removed last week, swelling present along the lateral patellar edge  Functional ROM: Decreased ROM for hip and knee joints          Functional ROM: Decreased ROM for hip and knee joints          Muscle Tone/Strength: Functionally intact. No obvious neuro-muscular anomalies detected.  Muscle Tone/Strength: Functionally intact. No obvious neuro-muscular anomalies detected.  Sensory (Neurological): Arthropathic arthralgia        Sensory (Neurological): Arthropathic arthralgia        DTR: Patellar: 0: absent Achilles: 0:  absent Plantar: deferred today  DTR: Patellar: 0: absent Achilles: 0: absent Plantar: deferred today  Palpation: No palpable anomalies  Palpation: No palpable anomalies    Palpation: No palpable anomalies  Palpation: No palpable anomalies   Assessment   Status Diagnosis  Persistent Persistent Persistent 1. Chronic pain syndrome   2. Sensory polyneuropathy   3. History of bilateral knee replacement   4. Infection of total left knee replacement, subsequent encounter   5. Lumbar facet arthropathy   6. Lumbar spondylosis   7. Chronic radicular lumbar pain   8. Lumbar radicular pain      Plan of Care  Pharmacotherapy (Medications Ordered): Meds ordered this encounter  Medications  . Buprenorphine HCl (BELBUCA) 300 MCG FILM    Sig: Place 1 Film inside cheek every 12 (twelve) hours as needed.    Dispense:  60 each    Refill:  1   Orders:  Orders Placed This Encounter  Procedures  . Compliance Drug Analysis, Ur    Volume: 30 ml(s). Minimum 3 ml of urine is needed. Document temperature of fresh sample. Indications: Long term (current) use of opiate analgesic (Z79.891) Test#: [id number] (Comprehensive Profile)    Lab Orders     Compliance Drug Analysis, Ur Planned follow-up:   Return in about 4 weeks (around 03/12/2019) for Medication Management, in person.    Recent Visits Date Type Provider Dept  12/27/18 Office Visit Gillis Santa, MD [facility]  11/26/18 Office Visit Gillis Santa, MD [facility]  Showing recent visits within past 90 days and meeting all other requirements   Today's Visits Date Type Provider Dept  02/12/19 Office Visit Gillis Santa, MD [facility]  Showing today's visits and meeting all other requirements   Future Appointments Date Type Provider Dept  03/12/19 Appointment Gillis Santa, MD [facility]  Showing future appointments within next 90 days and meeting all other requirements   Primary Care  Physician: Juluis Pitch, MD Location: [facility] Note by: Gillis Santa, MD Date: 02/12/2019; Time: 11:10 AM  Note: This dictation was prepared with Dragon dictation. Any transcriptional errors that may result from this process are unintentional.

## 2019-02-12 NOTE — Progress Notes (Signed)
Safety precautions to be maintained throughout the outpatient stay will include: orient to surroundings, keep bed in low position, maintain call bell within reach at all times, provide assistance with transfer out of bed and ambulation.  

## 2019-02-12 NOTE — Telephone Encounter (Signed)
His medicine needs prior authorization. He wants to how long this will take to get. He went to the pharmacy this morning.

## 2019-02-14 ENCOUNTER — Telehealth: Payer: Self-pay | Admitting: Student in an Organized Health Care Education/Training Program

## 2019-02-14 NOTE — Telephone Encounter (Signed)
Patient would like to speak w/nurse regarding his medication denial

## 2019-02-14 NOTE — Telephone Encounter (Signed)
Patient called stating his meds have been denied again. I advised him to call his insurance. I told him I would inform Dr. Holley Raring and see if there is something different he could prescribe, also that Dr. Holley Raring is out until Monday.

## 2019-02-14 NOTE — Telephone Encounter (Signed)
Spoke with patient and he was going to call the insurance company to see why it was denied and let us know.

## 2019-02-14 NOTE — Telephone Encounter (Signed)
Completed information that was faxed to [place] by insurance and resent.

## 2019-02-16 LAB — COMPLIANCE DRUG ANALYSIS, UR

## 2019-02-22 ENCOUNTER — Other Ambulatory Visit: Payer: Self-pay | Admitting: Licensed Clinical Social Worker

## 2019-02-22 DIAGNOSIS — A4901 Methicillin susceptible Staphylococcus aureus infection, unspecified site: Secondary | ICD-10-CM

## 2019-02-22 MED ORDER — CEPHALEXIN 500 MG PO CAPS: 500.0000 mg | ORAL_CAPSULE | Freq: Three times a day (TID) | ORAL | 1 refills | Status: DC

## 2019-03-12 ENCOUNTER — Other Ambulatory Visit: Payer: Self-pay

## 2019-03-12 ENCOUNTER — Ambulatory Visit
Payer: 59 | Attending: Student in an Organized Health Care Education/Training Program | Admitting: Student in an Organized Health Care Education/Training Program

## 2019-03-12 ENCOUNTER — Encounter: Payer: Self-pay | Admitting: Student in an Organized Health Care Education/Training Program

## 2019-03-12 VITALS — BP 150/97 | HR 81 | Temp 97.2°F | Ht 75.0 in | Wt 330.0 lb

## 2019-03-12 DIAGNOSIS — M48061 Spinal stenosis, lumbar region without neurogenic claudication: Secondary | ICD-10-CM | POA: Diagnosis present

## 2019-03-12 DIAGNOSIS — G608 Other hereditary and idiopathic neuropathies: Secondary | ICD-10-CM | POA: Insufficient documentation

## 2019-03-12 DIAGNOSIS — M5416 Radiculopathy, lumbar region: Secondary | ICD-10-CM | POA: Diagnosis present

## 2019-03-12 DIAGNOSIS — M9973 Connective tissue and disc stenosis of intervertebral foramina of lumbar region: Secondary | ICD-10-CM | POA: Diagnosis present

## 2019-03-12 DIAGNOSIS — Z96653 Presence of artificial knee joint, bilateral: Secondary | ICD-10-CM

## 2019-03-12 DIAGNOSIS — T8454XD Infection and inflammatory reaction due to internal left knee prosthesis, subsequent encounter: Secondary | ICD-10-CM

## 2019-03-12 DIAGNOSIS — M47816 Spondylosis without myelopathy or radiculopathy, lumbar region: Secondary | ICD-10-CM | POA: Insufficient documentation

## 2019-03-12 DIAGNOSIS — G8929 Other chronic pain: Secondary | ICD-10-CM | POA: Insufficient documentation

## 2019-03-12 DIAGNOSIS — G894 Chronic pain syndrome: Secondary | ICD-10-CM | POA: Diagnosis not present

## 2019-03-12 MED ORDER — BELBUCA 450 MCG BU FILM: 1.0000 | ORAL_FILM | Freq: Two times a day (BID) | BUCCAL | 1 refills | Status: DC

## 2019-03-12 NOTE — Progress Notes (Signed)
Nursing Pain Medication Assessment:  Safety precautions to be maintained throughout the outpatient stay will include: orient to surroundings, keep bed in low position, maintain call bell within reach at all times, provide assistance with transfer out of bed and ambulation.  Medication Inspection Compliance: Pill count conducted under aseptic conditions, in front of the patient. Neither the pills nor the bottle was removed from the patient's sight at any time. Once count was completed pills were immediately returned to the patient in their original bottle.  Medication: Buprenorphine (Suboxone) Pill/Patch Count: 11 of 60 pills remain Pill/Patch Appearance: Markings consistent with prescribed medication Bottle Appearance: Standard pharmacy container. Clearly labeled. Filled Date: 47 / 13 / 2020 Last Medication intake:  Today

## 2019-03-12 NOTE — Progress Notes (Signed)
Safety precautions to be maintained throughout the outpatient stay will include: orient to surroundings, keep bed in low position, maintain call bell within reach at all times, provide assistance with transfer out of bed and ambulation.  

## 2019-03-12 NOTE — Progress Notes (Signed)
Patient's Name: Nathan Chambers  MRN: [id number]  Referring Provider: Juluis Pitch, MD  DOB: [birth year]  PCP: Juluis Pitch, MD  DOS: 03/12/2019  Note by: Gillis Santa, MD  Service setting: Ambulatory outpatient  Attending: Gillis Santa, MD  Location: [facility] (AMB) Pain Management Facility  Specialty: Interventional Pain Management  Patient type: Established   Primary Reason(s) for Visit: Encounter for prescription drug management. (Level of risk: moderate)  CC: Back Pain  HPI  Nathan Chambers is a 53 y.o. year old, male patient, who comes today for a medication management evaluation. He has Arthritis, septic, knee ([place]); Anxiety; Hyperglycemia, unspecified; Hyperlipidemia; Hypertension; Infection of total left knee replacement ([place]); Chronic radicular lumbar pain; History of bilateral knee replacement; Lumbar radicular pain; Lumbar facet arthropathy; Spinal stenosis of lumbar region without neurogenic claudication; Lumbar spondylosis; Neuroforaminal stenosis of lumbar spine; Sensory polyneuropathy; S/P revision of total knee, left; Status post revision of total knee; Chronic pain syndrome; Evaluation by psychiatric service required; BMI 40.0-44.9, adult (HCC); and Panic attacks on their problem list. His primarily concern today is the Back Pain  Pain Assessment: Location: Left, Right Back Radiating: Pain radiaties down his back to leg down to feet Onset: More than a month ago Duration: Chronic pain Quality: Shooting, Sharp, Burning, Pins and needles, Aching Severity: 4 /10 (subjective, self-reported pain score)  Note: Reported level is compatible with observation.                         When using our objective Pain Scale, levels between 6 and 10/10 are said to belong in an emergency room, as it progressively worsens from a 6/10, described as severely limiting, requiring emergency care not usually available at an outpatient pain management facility. At a 6/10 level, communication becomes  difficult and requires great effort. Assistance to reach the emergency department may be required. Facial flushing and profuse sweating along with potentially dangerous increases in heart rate and blood pressure will be evident. Effect on ADL: limits my daily actvities Timing: Constant Modifying factors: belbuca helps little BP: (!) 150/97  HR: 81  Nathan Chambers was last scheduled for an appointment on 02/14/2019 for medication management. During today's appointment we reviewed Nathan Chambers's chronic pain status, as well as his outpatient medication regimen.  At last visit, patient was started on buprenorphine 300 mcg twice daily.  He is finding benefit with this medication but feels that we can optimize his pain relief.  No significant side effects and he feels that his overall pain is better managed on a long acting such as this.  No nausea no vomiting no constipation.  No cognitive issues.  In an attempt to try to optimize pain management, will increase dose to 450 mcg twice daily.  The patient  reports no history of drug use. His body mass index is 41.25 kg/m.  Further details on both, my assessment(s), as well as the proposed treatment plan, please see below.  Controlled Substance Pharmacotherapy Assessment REMS (Risk Evaluation and Mitigation Strategy)  Analgesic:  02/15/2019  1   02/12/2019  Belbuca 300 MCG Film  60.00  30 Bi Lat   [id number]   Wal (5798)   0  0.60 mg  Comm Ins   [place]     Nathan Fischer, RN  03/12/2019 11:56 AM  Sign when Signing Visit Nursing Pain Medication Assessment:  Safety precautions to be maintained throughout the outpatient stay will include: orient to surroundings, keep bed in low position, maintain  call bell within reach at all times, provide assistance with transfer out of bed and ambulation.  Medication Inspection Compliance: Pill count conducted under aseptic conditions, in front of the patient. Neither the pills nor the bottle was removed from the patient's  sight at any time. Once count was completed pills were immediately returned to the patient in their original bottle.  Medication: Buprenorphine (Suboxone) Pill/Patch Count: 11 of 60 pills remain Pill/Patch Appearance: Markings consistent with prescribed medication Bottle Appearance: Standard pharmacy container. Clearly labeled. Filled Date: 29 / 13 / 2020 Last Medication intake:  Today  Nathan Fischer, RN  03/12/2019 11:44 AM  Sign when Signing Visit Safety precautions to be maintained throughout the outpatient stay will include: orient to surroundings, keep bed in low position, maintain call bell within reach at all times, provide assistance with transfer out of bed and ambulation.    Pharmacokinetics: Liberation and absorption (onset of action): WNL Distribution (time to peak effect): WNL Metabolism and excretion (duration of action): WNL         Pharmacodynamics: Desired effects: Analgesia: Mr. Mclinden reports <50% benefit. Functional ability: Patient reports that medication allows him to accomplish basic ADLs Clinically meaningful improvement in function (CMIF): Sustained CMIF goals met Perceived effectiveness: Described as relatively effective but with some room for improvement Undesirable effects: Side-effects or Adverse reactions: None reported Monitoring: [place] PMP: PDMP not reviewed this encounter. Online review of the past 40-monthperiod conducted. Compliant with practice rules and regulations Last UDS on record: Summary  Date Value Ref Range Status  02/13/2019 Note  Final    Comment:    ==================================================================== Compliance Drug Analysis, Ur ==================================================================== Test                             Result       Flag       Units Drug Present and Declared for Prescription Verification   Desmethyldiazepam              538          EXPECTED   ng/mg creat   Oxazepam                        974          EXPECTED   ng/mg creat   Temazepam                      1335         EXPECTED   ng/mg creat    Desmethyldiazepam, oxazepam, and temazepam are expected metabolites    of diazepam. Desmethyldiazepam and oxazepam are also expected    metabolites of other drugs, including chlordiazepoxide, prazepam,    clorazepate, and halazepam. Oxazepam is an expected metabolite of    temazepam. Oxazepam and temazepam are also available as scheduled    prescription medications. Drug Absent but Declared for Prescription Verification   Buprenorphine                  Not Detected UNEXPECTED ng/mg creat   Duloxetine                     Not Detected UNEXPECTED   Paroxetine                     Not Detected UNEXPECTED ==================================================================== Test  Result    Flag   Units      Ref Range   Creatinine              141              mg/dL      >=20 ==================================================================== Declared Medications:  The flagging and interpretation on this report are based on the  following declared medications.  Unexpected results may arise from  inaccuracies in the declared medications.  **Note: The testing scope of this panel includes these medications:  Buprenorphine  Diazepam  Duloxetine  Paroxetine  **Note: The testing scope of this panel does not include the  following reported medications:  Amlodipine  Benazepril  Buspirone  Cephalexin  Chlorthalidone  Folic Acid  Gemfibrozil  Meloxicam  Multivitamin  Naloxone  Pravastatin  Rifampin  Ropinirole  Vitamin B12  Vitamin C ==================================================================== For clinical consultation, please call [phone]. ====================================================================    UDS interpretation: Compliant          Medication Assessment Form: Reviewed. Patient indicates being compliant with therapy Treatment  compliance: Compliant Risk Assessment Profile: Aberrant behavior: See initial evaluations. None observed or detected today Comorbid factors increasing risk of overdose: See initial evaluation. No additional risks detected today Opioid risk tool (ORT):  Opioid Risk  12/27/2018  Alcohol 3  Illegal Drugs 0  Rx Drugs 0  Alcohol -  Illegal Drugs 0  Rx Drugs 0  Age between 16-45 years  0  Psychological Disease 2  ADD Negative  OCD Negative  Bipolar Negative  Depression 1  Opioid Risk Tool Scoring 6  Opioid Risk Interpretation Moderate Risk    ORT Scoring interpretation table:  Score <3 = Low Risk for SUD  Score between 4-7 = Moderate Risk for SUD  Score >8 = High Risk for Opioid Abuse   Risk of substance use disorder (SUD): Low  Risk Mitigation Strategies:  Patient Counseling: Covered Patient-Prescriber Agreement (PPA): Present and active  Notification to other healthcare providers: Done  Pharmacologic Plan: Dose increased from belbuca 300 mcg twice daily to 450 mcg twice daily to optimize pain management             Laboratory Chemistry Profile   Screening Lab Results  Component Value Date   SARSCOV2NAA NEGATIVE 01/08/2019   COVIDSOURCE NASOPHARYNGEAL 10/17/2018   STAPHAUREUS NEGATIVE 11/19/2018   MRSAPCR NEGATIVE 11/19/2018   HIV Non Reactive 12/09/2017    Inflammation (CRP: Acute Phase) (ESR: Chronic Phase) Lab Results  Component Value Date   CRP 1.9 (H) 01/22/2019   ESRSEDRATE 27 (H) 01/22/2019                         Rheumatology No results found for: RF, ANA, LABURIC, URICUR, LYMEIGGIGMAB, LYMEABIGMQN, HLAB27                      Renal Lab Results  Component Value Date   BUN 9 01/22/2019   CREATININE 0.75 01/22/2019   GFRAA >60 01/22/2019   GFRNONAA >60 01/22/2019                             Hepatic Lab Results  Component Value Date   AST 45 (H) 01/22/2019   ALT 38 01/22/2019   ALBUMIN 4.3 01/22/2019   ALKPHOS 108 01/22/2019  Electrolytes Lab Results  Component Value Date   NA 139 01/22/2019   K 3.9 01/22/2019   CL 102 01/22/2019   CALCIUM 9.5 01/22/2019   MG 2.4 01/06/2013                        Neuropathy Lab Results  Component Value Date   HGBA1C 6.0 01/06/2013   HIV Non Reactive 12/09/2017                        Coagulation Lab Results  Component Value Date   INR 1.0 11/19/2018   LABPROT 13.3 11/19/2018   APTT 31 11/19/2018   PLT 351 01/22/2019                        Cardiovascular Lab Results  Component Value Date   CKTOTAL 115 01/22/2019   TROPONINI <0.03 12/08/2017   HGB 14.2 01/22/2019   HCT 43.8 01/22/2019                         ID Lab Results  Component Value Date   HIV Non Reactive 12/09/2017   SARSCOV2NAA NEGATIVE 01/08/2019   STAPHAUREUS NEGATIVE 11/19/2018   MRSAPCR NEGATIVE 11/19/2018     Endocrine Lab Results  Component Value Date   TSH 0.89 01/06/2013                        Note: Lab results reviewed.  Recent Diagnostic Imaging Results  DG Knee 1-2 Views Left CLINICAL DATA:  Status post left knee replacement.  EXAM: LEFT KNEE - 1-2 VIEW  COMPARISON:  09/11/2018  FINDINGS: Status post left knee total arthroplasty with expected overlying postop changes and knee joint effusion. No evidence of perihardware fracture or dislocation. Antibiotic packing beads, predominantly about the distal femur.  IMPRESSION: Status post left knee total arthroplasty with expected overlying postop changes and knee joint effusion. No evidence of perihardware fracture or dislocation. Antibiotic packing beads, predominantly about the distal femur.  Electronically Signed   By: Eddie Candle M.D.   On: 11/27/2018 11:31  Complexity Note: Imaging results reviewed. Results shared with Mr. Dunklee, using Layman's terms.                               Meds   Current Outpatient Medications:  .  amLODipine-benazepril (LOTREL) 10-40 MG capsule, Take 1 capsule by mouth  every morning. , Disp: , Rfl:  .  [START ON 03/18/2019] Buprenorphine HCl (BELBUCA) 450 MCG FILM, Place 1 Film inside cheek every 12 (twelve) hours., Disp: 60 each, Rfl: 1 .  busPIRone (BUSPAR) 5 MG tablet, Take 10 mg by mouth 2 (two) times daily. , Disp: , Rfl:  .  cephALEXin (KEFLEX) 500 MG capsule, Take 1 capsule (500 mg total) by mouth 3 (three) times daily., Disp: 120 capsule, Rfl: 1 .  chlorthalidone (HYGROTON) 25 MG tablet, Take 12.5 mg by mouth daily., Disp: , Rfl:  .  diazepam (VALIUM) 5 MG tablet, Take 5 mg by mouth 2 (two) times daily with a meal., Disp: , Rfl:  .  DULoxetine (CYMBALTA) 60 MG capsule, Take 60 mg by mouth every morning. , Disp: , Rfl:  .  folic acid (FOLVITE) 790 MCG tablet, Take 800 mcg by mouth daily. , Disp: , Rfl:  .  gemfibrozil (LOPID)  600 MG tablet, Take 600 mg by mouth 2 (two) times daily before a meal., Disp: , Rfl:  .  meloxicam (MOBIC) 15 MG tablet, Take 15 mg by mouth daily., Disp: , Rfl:  .  Multiple Vitamins-Minerals (MULTIVITAMIN WITH MINERALS) tablet, Take 1 tablet by mouth daily., Disp: , Rfl:  .  multivitamin-lutein (OCUVITE-LUTEIN) CAPS capsule, Take 1 capsule by mouth daily., Disp: , Rfl:  .  naloxone (NARCAN) 0.4 MG/ML injection, Inject 1 mL (0.4 mg total) into the vein as needed (respiratory rate < 10)., Disp: 1 mL, Rfl: 0 .  PARoxetine (PAXIL) 20 MG tablet, Take 60 mg by mouth daily. , Disp: , Rfl:  .  pravastatin (PRAVACHOL) 20 MG tablet, Take 20 mg by mouth every evening. , Disp: , Rfl:  .  rifampin (RIFADIN) 300 MG capsule, , Disp: , Rfl:  .  rOPINIRole (REQUIP) 0.5 MG tablet, , Disp: , Rfl:  .  ropinirole (REQUIP) 5 MG tablet, Take 10 mg by mouth 2 (two) times daily., Disp: , Rfl:  .  vitamin B-12 (CYANOCOBALAMIN) 500 MCG tablet, Take 500 mcg by mouth daily. , Disp: , Rfl:  .  vitamin C (ASCORBIC ACID) 500 MG tablet, Take 500 mg by mouth daily., Disp: , Rfl:  .  Buprenorphine HCl (BELBUCA) 300 MCG FILM, Place 1 Film inside cheek every 12  (twelve) hours as needed. (Patient not taking: Reported on 03/12/2019), Disp: 60 each, Rfl: 1  ROS  Constitutional: Denies any fever or chills Gastrointestinal: No reported hemesis, hematochezia, vomiting, or acute GI distress Musculoskeletal: Denies any acute onset joint swelling, redness, loss of ROM, or weakness Neurological: No reported episodes of acute onset apraxia, aphasia, dysarthria, agnosia, amnesia, paralysis, loss of coordination, or loss of consciousness  Allergies  Mr. Odriscoll is allergic to etodolac.  [place]  Drug: Mr. Zeidman  reports no history of drug use. Alcohol:  reports no history of alcohol use. Tobacco:  reports that he has never smoked. He quit smokeless tobacco use about 7 years ago.  His smokeless tobacco use included chew. Medical:  has a past medical history of Anxiety, Arthritis, Bipolar disorder ([place]), Chronic kidney disease, Complication of anesthesia, GERD (gastroesophageal reflux disease), Headache, Hemorrhoids, Hyperlipidemia, Hypertension, LFT elevation, and NAFLD (nonalcoholic fatty liver disease). Surgical: Mr. Sequeira  has a past surgical history that includes Colonoscopy; Fracture surgery (Left); Hernia repair; Lipoma excision; Vasectomy; Achilles tendon surgery (Right, 10/30/2015); Total knee arthroplasty (Bilateral, 04/19/2016); Colonoscopy with propofol (N/A, 04/07/2017); Knee arthrotomy (Left, 12/08/2017); I&D knee with poly exchange (Left, 12/08/2017); Total knee revision (Left, 06/11/2018); Joint replacement (Bilateral, 2017); Total knee arthroplasty (Left, 09/11/2018); Total knee revision (Left, 11/27/2018); and Patellar tendon repair (Left, 01/10/2019). Family: family history includes Alcohol abuse in his maternal grandfather.  Constitutional Exam  General appearance: Well nourished, well developed, and well hydrated. In no apparent acute distress Vitals:   03/12/19 1144  BP: (!) 150/97  Pulse: 81  Temp: (!) 97.2 F (36.2 C)  SpO2: 97%  Weight: (!) 330  lb (149.7 kg)  Height: '6\' 3"'$  (1.905 m)   BMI Assessment: Estimated body mass index is 41.25 kg/m as calculated from the following:   Height as of this encounter: '6\' 3"'$  (1.905 m).   Weight as of this encounter: 330 lb (149.7 kg).  BMI interpretation table: BMI level Category Range association with higher incidence of chronic pain  <18 kg/m2 Underweight   18.5-24.9 kg/m2 Ideal body weight   25-29.9 kg/m2 Overweight Increased incidence by 20%  30-34.9 kg/m2 Obese (  Class I) Increased incidence by 68%  35-39.9 kg/m2 Severe obesity (Class II) Increased incidence by 136%  >40 kg/m2 Extreme obesity (Class III) Increased incidence by 254%   Patient's current BMI Ideal Body weight  Body mass index is 41.25 kg/m. Ideal body weight: 84.5 kg (186 lb 4.6 oz) Adjusted ideal body weight: 110.6 kg (243 lb 12.4 oz)   BMI Readings from Last 4 Encounters:  03/12/19 41.25 kg/m  02/12/19 41.87 kg/m  01/22/19 41.25 kg/m  01/10/19 41.88 kg/m   Wt Readings from Last 4 Encounters:  03/12/19 (!) 330 lb (149.7 kg)  02/12/19 (!) 335 lb (152 kg)  01/22/19 (!) 330 lb (149.7 kg)  01/10/19 (!) 335 lb 1.6 oz (152 kg)  Psych/Mental status: Alert, oriented x 3 (person, place, & time)       Eyes: PERLA Respiratory: No evidence of acute respiratory distress  Cervical Spine Area Exam  Skin & Axial Inspection: No masses, redness, edema, swelling, or associated skin lesions Alignment: Symmetrical Functional ROM: Unrestricted ROM      Stability: No instability detected Muscle Tone/Strength: Functionally intact. No obvious neuro-muscular anomalies detected. Sensory (Neurological): Unimpaired Palpation: No palpable anomalies              Upper Extremity (UE) Exam    Side: Right upper extremity  Side: Left upper extremity  Skin & Extremity Inspection: Skin color, temperature, and hair growth are WNL. No peripheral edema or cyanosis. No masses, redness, swelling, asymmetry, or associated skin lesions. No  contractures.  Skin & Extremity Inspection: Skin color, temperature, and hair growth are WNL. No peripheral edema or cyanosis. No masses, redness, swelling, asymmetry, or associated skin lesions. No contractures.  Functional ROM: Unrestricted ROM          Functional ROM: Unrestricted ROM          Muscle Tone/Strength: Functionally intact. No obvious neuro-muscular anomalies detected.  Muscle Tone/Strength: Functionally intact. No obvious neuro-muscular anomalies detected.  Sensory (Neurological): Unimpaired          Sensory (Neurological): Unimpaired          Palpation: No palpable anomalies              Palpation: No palpable anomalies              Provocative Test(s):  Phalen's test: deferred Tinel's test: deferred Apley's scratch test (touch opposite shoulder):  Action 1 (Across chest): deferred Action 2 (Overhead): deferred Action 3 (LB reach): deferred   Provocative Test(s):  Phalen's test: deferred Tinel's test: deferred Apley's scratch test (touch opposite shoulder):  Action 1 (Across chest): deferred Action 2 (Overhead): deferred Action 3 (LB reach): deferred    Thoracic Spine Area Exam  Skin & Axial Inspection: No masses, redness, or swelling Alignment: Symmetrical Functional ROM: Unrestricted ROM Stability: No instability detected Muscle Tone/Strength: Functionally intact. No obvious neuro-muscular anomalies detected. Sensory (Neurological): Unimpaired Muscle strength & Tone: No palpable anomalies  Lumbar Spine Area Exam  Skin & Axial Inspection: No masses, redness, or swelling Alignment: Symmetrical Functional ROM: Unrestricted ROM       Stability: No instability detected Muscle Tone/Strength: Functionally intact. No obvious neuro-muscular anomalies detected. Sensory (Neurological): Unimpaired Palpation: No palpable anomalies       Provocative Tests: Hyperextension/rotation test: deferred today       Lumbar quadrant test (Kemp's test): deferred today       Lateral  bending test: deferred today       Patrick's Maneuver: deferred today  FABER* test: deferred today                   S-I anterior distraction/compression test: deferred today         S-I lateral compression test: deferred today         S-I Thigh-thrust test: deferred today         S-I Gaenslen's test: deferred today         *(Flexion, ABduction and External Rotation)  Gait & Posture Assessment  Ambulation: Patient came in today in a wheel chair Gait: Antalgic gait (limping) Posture: Difficulty standing up straight, due to pain   Lower Extremity Exam    Side: Right lower extremity  Side: Left lower extremity  Stability: No instability observed          Stability: No instability observed          Skin & Extremity Inspection: Evidence of prior arthroplastic surgery  Skin & Extremity Inspection: Evidence of prior arthroplastic surgery  Functional ROM: Pain restricted ROM for hip and knee joints          Functional ROM: Pain restricted ROM for hip and knee joints          Muscle Tone/Strength: Functionally intact. No obvious neuro-muscular anomalies detected.  Muscle Tone/Strength: Functionally intact. No obvious neuro-muscular anomalies detected.  Sensory (Neurological): Musculoskeletal pain pattern        Sensory (Neurological): Musculoskeletal pain pattern        DTR: Patellar: 0: absent Achilles: deferred today Plantar: deferred today  DTR: Patellar: 0: absent Achilles: deferred today Plantar: deferred today  Palpation: No palpable anomalies  Palpation: No palpable anomalies   Assessment   Status Diagnosis  Responding Controlled Controlled 1. Chronic pain syndrome   2. Sensory polyneuropathy   3. History of bilateral knee replacement   4. Infection of total left knee replacement, subsequent encounter   5. Lumbar facet arthropathy   6. Lumbar spondylosis   7. Chronic radicular lumbar pain   8. Lumbar radicular pain   9. Spinal stenosis of lumbar region  without neurogenic claudication   10. Neuroforaminal stenosis of lumbar spine       Plan of Care  Pharmacotherapy (Medications Ordered): Meds ordered this encounter  Medications  . Buprenorphine HCl (BELBUCA) 450 MCG FILM    Sig: Place 1 Film inside cheek every 12 (twelve) hours.    Dispense:  60 each    Refill:  1   Planned follow-up:   Return in about 9 weeks (around 05/14/2019) for Medication Management, virtual.    Recent Visits Date Type Provider Dept  02/12/19 Office Visit Gillis Santa, MD [facility]  12/27/18 Office Visit Gillis Santa, MD [facility]  Showing recent visits within past 90 days and meeting all other requirements   Today's Visits Date Type Provider Dept  03/12/19 Office Visit Gillis Santa, MD [facility]  Showing today's visits and meeting all other requirements   Future Appointments Date Type Provider Dept  05/14/19 Appointment Gillis Santa, MD [facility]  Showing future appointments within next 90 days and meeting all other requirements   Primary Care Physician: Juluis Pitch, MD Location: [facility] Out[facility] Management Facility Note by: Gillis Santa, MD Date: 03/12/2019; Time: 1:45 PM  Note: This dictation was prepared with Dragon dictation. Any transcriptional errors that may result from this process are unintentional.

## 2019-04-02 ENCOUNTER — Ambulatory Visit: Payer: 59 | Admitting: Infectious Diseases

## 2019-04-11 ENCOUNTER — Ambulatory Visit: Payer: 59 | Attending: Infectious Diseases | Admitting: Infectious Diseases

## 2019-04-11 ENCOUNTER — Encounter: Payer: Self-pay | Admitting: Infectious Diseases

## 2019-04-11 ENCOUNTER — Other Ambulatory Visit
Admission: RE | Admit: 2019-04-11 | Discharge: 2019-04-11 | Disposition: A | Discharge status: Home / Self Care | Payer: 59 | Source: Ambulatory Visit | Attending: Infectious Diseases | Admitting: Infectious Diseases

## 2019-04-11 ENCOUNTER — Other Ambulatory Visit: Payer: Self-pay

## 2019-04-11 VITALS — BP 152/89 | HR 73 | Temp 98.0°F | Resp 16 | Ht 75.0 in | Wt 344.0 lb

## 2019-04-11 DIAGNOSIS — F319 Bipolar disorder, unspecified: Secondary | ICD-10-CM

## 2019-04-11 DIAGNOSIS — T8454XD Infection and inflammatory reaction due to internal left knee prosthesis, subsequent encounter: Secondary | ICD-10-CM

## 2019-04-11 DIAGNOSIS — Z79899 Other long term (current) drug therapy: Secondary | ICD-10-CM

## 2019-04-11 DIAGNOSIS — Z792 Long term (current) use of antibiotics: Secondary | ICD-10-CM

## 2019-04-11 DIAGNOSIS — B9561 Methicillin susceptible Staphylococcus aureus infection as the cause of diseases classified elsewhere: Secondary | ICD-10-CM

## 2019-04-11 DIAGNOSIS — Z96653 Presence of artificial knee joint, bilateral: Secondary | ICD-10-CM

## 2019-04-11 DIAGNOSIS — Z87891 Personal history of nicotine dependence: Secondary | ICD-10-CM

## 2019-04-11 DIAGNOSIS — F419 Anxiety disorder, unspecified: Secondary | ICD-10-CM

## 2019-04-11 DIAGNOSIS — I10 Essential (primary) hypertension: Secondary | ICD-10-CM

## 2019-04-11 DIAGNOSIS — E785 Hyperlipidemia, unspecified: Secondary | ICD-10-CM

## 2019-04-11 DIAGNOSIS — T8450XD Infection and inflammatory reaction due to unspecified internal joint prosthesis, subsequent encounter: Secondary | ICD-10-CM

## 2019-04-11 DIAGNOSIS — Y792 Prosthetic and other implants, materials and accessory orthopedic devices associated with adverse incidents: Secondary | ICD-10-CM | POA: Insufficient documentation

## 2019-04-11 DIAGNOSIS — Z888 Allergy status to other drugs, medicaments and biological substances status: Secondary | ICD-10-CM

## 2019-04-11 LAB — COMPREHENSIVE METABOLIC PANEL
ALT: 39 U/L (ref 0–44)
AST: 50 U/L — ABNORMAL HIGH (ref 15–41)
Albumin: 4.2 g/dL (ref 3.5–5.0)
Alkaline Phosphatase: 89 U/L (ref 38–126)
Anion gap: 13 (ref 5–15)
BUN: 11 mg/dL (ref 6–20)
CO2: 24 mmol/L (ref 22–32)
Calcium: 9.1 mg/dL (ref 8.9–10.3)
Chloride: 101 mmol/L (ref 98–111)
Creatinine, Ser: 0.76 mg/dL (ref 0.61–1.24)
GFR calc Af Amer: >60 mL/min (ref >60)
GFR calc non Af Amer: >60 mL/min (ref >60)
Glucose, Bld: 167 mg/dL — ABNORMAL HIGH (ref 70–99)
Potassium: 3.8 mmol/L (ref 3.5–5.1)
Sodium: 138 mmol/L (ref 135–145)
Total Bilirubin: 0.7 mg/dL (ref 0.3–1.2)
Total Protein: 8.3 g/dL — ABNORMAL HIGH (ref 6.5–8.1)

## 2019-04-11 LAB — CBC WITH DIFFERENTIAL/PLATELET
Abs Immature Granulocytes: 0.03 10*3/uL (ref 0.00–0.07)
Basophils Absolute: 0.1 10*3/uL (ref 0.0–0.1)
Basophils Relative: 1 %
Eosinophils Absolute: 0.5 10*3/uL (ref 0.0–0.5)
Eosinophils Relative: 7 %
HCT: 45.4 % (ref 39.0–52.0)
Hemoglobin: 15.4 g/dL (ref 13.0–17.0)
Immature Granulocytes: 0 %
Lymphocytes Relative: 23 %
Lymphs Abs: 1.9 10*3/uL (ref 0.7–4.0)
MCH: 28.7 pg (ref 26.0–34.0)
MCHC: 33.9 g/dL (ref 30.0–36.0)
MCV: 84.5 fL (ref 80.0–100.0)
Monocytes Absolute: 0.6 10*3/uL (ref 0.1–1.0)
Monocytes Relative: 7 %
Neutro Abs: 5 10*3/uL (ref 1.7–7.7)
Neutrophils Relative %: 62 %
Platelets: 263 10*3/uL (ref 150–400)
RBC: 5.37 MIL/uL (ref 4.22–5.81)
RDW: 12.9 % (ref 11.5–15.5)
WBC: 8.1 10*3/uL (ref 4.0–10.5)
nRBC: 0 % (ref 0.0–0.2)

## 2019-04-11 LAB — C-REACTIVE PROTEIN: CRP: 1.3 mg/dL — ABNORMAL HIGH (ref <1.0)

## 2019-04-11 LAB — SEDIMENTATION RATE: Sed Rate: 10 mm/hr (ref 0–20)

## 2019-04-11 NOTE — Progress Notes (Signed)
NAME: Nathan Chambers  DOB: 1966-03-30  MRN: [id number]  Date/Time: 04/11/2019 10:53 AM   Subjective:    Follow up visit  Nathan Chambers is a 53 y.o. male with a history of complicated left knee history, hyperlipidemia, hypertension, anxiety and bipolar disorder is here to see me as follow up for the left knee PJI  Lasts surgery Sept 2002 to the left knee and has been doing better since then  He is taking Keflex and rifampin every day and is 100% adherent  Now his pain management med is only buprenorphine from 02/12/19- seeing Dr.Lateef  Following part of the note taken from my previous note  . Patient has got a complicated history.  In 2017 he underwent bilateral knee replacement in December.  He was doing well until August 2019 when the left knee got infected secondary to staph aureus and he underwent arthrotomy and irrigation and debridement and was on nafcillin for 6 weeks followed by oral antibiotics.  Then in May 2020 he had to undergo a total knee arthroplasty and removal of the hardware with an antibiotic spacer placement and again received 6 weeks of IV nafcillin.  And on 11/27/2018 he underwent total knee revision reimplantation of the left knee.  Cultures were negative then.  He has been on Keflex and rifampin since then.  But he started to collect serous fluid which was draining from the left knee and also he found that the patella was displaced to the left.  So on 01/10/2019 Dr. Rudene Christians took him for another surgery and did a left medial retinacular repair.  Cultures sent sent from the fluid as well as tissue from the knee did not grow any bacteria nor was any WBCs seen.   He is taking Keflex and rifampin every day and is 100% adherent .Patient is doing fine since the last surgery.  Is been able to walk better   01/10/19: LATERAL DISLOCATION OF LEFT PATELLA, LEFT MEDIAL RETINACULAR REPAIR (Left) with lateral release open- culture negative On keflex and rifampin  7/28 /20 :Total knee  revision reimplantation of left knee on 11/27/18.culture negative- on keflex and rifampin since then  09/11/2018 underwent removal of the total knee arthroplasty and had an antibiotic spacer and then took 6 weeks of IV nafcillin which he completed on 10/23/2018  09/03/2018 had aspiration of the left knee and that culture came back as staph aureus  06/08/18 underwent I/Dof the left knee with poly-exchange and tissue culture was negative  12/08/17: left knee arthrotomy and irrigation debridement and polyethylene exchange - MSSA culture - 6 weeks of IV naf followed by oral antibiotics   04/19/2016 : b/l knee replacement  Past Medical History:  Diagnosis Date  . Anxiety   . Arthritis    knees, hands  . Bipolar disorder ([place])   . Chronic kidney disease    arf 2014 due to dehydration  . Complication of anesthesia    woke up during last knee surgery feb 2020  . GERD (gastroesophageal reflux disease)    rare no meds  . Headache   . Hemorrhoids   . Hyperlipidemia   . Hypertension   . LFT elevation   . NAFLD (nonalcoholic fatty liver disease)     Past Surgical History:  Procedure Laterality Date  . ACHILLES TENDON SURGERY Right 10/30/2015   Procedure: ACHILLES TENDON REPAIR/ HEGLAND OSTECTOMY;  Surgeon: [name], DPM;  Location: [facility];  Service: Podiatry;  Laterality: Right;  . COLONOSCOPY    . COLONOSCOPY  WITH PROPOFOL N/A 04/07/2017   Procedure: COLONOSCOPY WITH PROPOFOL;  Surgeon: Toledo, Benay Pike, MD;  Location: [facility];  Service: Gastroenterology;  Laterality: N/A;  . FRACTURE SURGERY Left    MIDDLE FINGER  . HERNIA REPAIR    . I&D KNEE WITH POLY EXCHANGE Left 12/08/2017   Procedure: IRRIGATION AND DEBRIDEMENT KNEE WITH POLY EXCHANGE;  Surgeon: Dereck Leep, MD;  Location: [facility];  Service: Orthopedics;  Laterality: Left;  . JOINT REPLACEMENT Bilateral 2017   bilateral knees  . KNEE ARTHROTOMY Left 12/08/2017   Procedure: KNEE ARTHROTOMY, I&D, [place] OUT, POLY EXCHANGE;   Surgeon: Dereck Leep, MD;  Location: [facility];  Service: Orthopedics;  Laterality: Left;  . LIPOMA EXCISION    . PATELLAR TENDON REPAIR Left 01/10/2019   Procedure: LEFT MEDIAL RETINACULAR REPAIR;  Surgeon: Hessie Knows, MD;  Location: [facility];  Service: Orthopedics;  Laterality: Left;  . TOTAL KNEE ARTHROPLASTY Bilateral 04/19/2016   Procedure: TOTAL KNEE BILATERAL;  Surgeon: Hessie Knows, MD;  Location: [facility];  Service: Orthopedics;  Laterality: Bilateral;  . TOTAL KNEE ARTHROPLASTY Left 09/11/2018   Procedure: TOTAL KNEE ARTHROPLASTY - LEFT - HARDWARE REMOVAL WITH INSERTION OF ANTIBIOTIC SPACER;  Surgeon: Hessie Knows, MD;  Location: [facility];  Service: Orthopedics;  Laterality: Left;  . TOTAL KNEE REVISION Left 06/11/2018   Procedure: INCISION AND DRAINAGE OF LEFT KNEE, POLY EXCHANGE;  Surgeon: Hessie Knows, MD;  Location: [facility];  Service: Orthopedics;  Laterality: Left;  . TOTAL KNEE REVISION Left 11/27/2018   Procedure: TOTAL KNEE REVISION REIMPLANTATION OF LEFT TOTAL KNEE;  Surgeon: Hessie Knows, MD;  Location: [facility];  Service: Orthopedics;  Laterality: Left;  Marland Kitchen VASECTOMY      Social History   Socioeconomic History  . Marital status: Married    Spouse name: Not on file  . Number of children: 1  . Years of education: High school  . Highest education level: Not on file  Occupational History  . Not on file  Tobacco Use  . Smoking status: Never Smoker  . Smokeless tobacco: Former Systems developer    Types: Chew  Substance and Sexual Activity  . Alcohol use: No  . Drug use: No  . Sexual activity: Yes  Other Topics Concern  . Not on file  Social History Narrative  . Not on file   Social Determinants of Health   Financial Resource Strain: Medium Risk  . Difficulty of Paying Living Expenses: Somewhat hard  Food Insecurity: No Food Insecurity  . Worried About Charity fundraiser in the Last Year: Never true  . Ran Out of Food in the Last Year: Never true  Transportation  Needs: Unknown  . Lack of Transportation (Medical): Patient refused  . Lack of Transportation (Non-Medical): Patient refused  Physical Activity: Insufficiently Active  . Days of Exercise per Week: 1 day  . Minutes of Exercise per Session: 30 min  Stress: Stress Concern Present  . Feeling of Stress : To some extent  Social Connections: Somewhat Isolated  . Frequency of Communication with Friends and Family: More than three times a week  . Frequency of Social Gatherings with Friends and Family: More than three times a week  . Attends Religious Services: Never  . Active Member of Clubs or Organizations: No  . Attends Archivist Meetings: Never  . Marital Status: Married  Human resources officer Violence: Unknown  . Fear of Current or Ex-Partner: Patient refused  . Emotionally Abused: Patient refused  . Physically  Abused: Patient refused  . Sexually Abused: Patient refused    Family History  Problem Relation Age of Onset  . Alcohol abuse Maternal Grandfather   . Mental illness Neg Hx    Allergies  Allergen Reactions  . Etodolac Itching    Current Outpatient Medications  Medication Sig Dispense Refill  . amLODipine-benazepril (LOTREL) 10-40 MG capsule Take 1 capsule by mouth every morning.     . Buprenorphine HCl (BELBUCA) 450 MCG FILM Place 1 Film inside cheek every 12 (twelve) hours. 60 each 1  . busPIRone (BUSPAR) 5 MG tablet Take 10 mg by mouth 2 (two) times daily.     . cephALEXin (KEFLEX) 500 MG capsule Take 1 capsule (500 mg total) by mouth 3 (three) times daily. 120 capsule 1  . chlorthalidone (HYGROTON) 25 MG tablet Take 12.5 mg by mouth daily.    . diazepam (VALIUM) 5 MG tablet Take 5 mg by mouth 2 (two) times daily with a meal.    . DULoxetine (CYMBALTA) 60 MG capsule Take 60 mg by mouth every morning.     . folic acid (FOLVITE) 353 MCG tablet Take 800 mcg by mouth daily.     Marland Kitchen gemfibrozil (LOPID) 600 MG tablet Take 600 mg by mouth 2 (two) times daily before a meal.     . meloxicam (MOBIC) 15 MG tablet Take 15 mg by mouth daily.    . Multiple Vitamins-Minerals (MULTIVITAMIN WITH MINERALS) tablet Take 1 tablet by mouth daily.    . multivitamin-lutein (OCUVITE-LUTEIN) CAPS capsule Take 1 capsule by mouth daily.    Marland Kitchen PARoxetine (PAXIL) 20 MG tablet Take 60 mg by mouth daily.     . pravastatin (PRAVACHOL) 20 MG tablet Take 20 mg by mouth every evening.     . rifampin (RIFADIN) 300 MG capsule     . vitamin B-12 (CYANOCOBALAMIN) 500 MCG tablet Take 500 mcg by mouth daily.     . vitamin C (ASCORBIC ACID) 500 MG tablet Take 500 mg by mouth daily.     No current facility-administered medications for this visit.     Abtx:  Anti-infectives (From admission, onward)   None      REVIEW OF SYSTEMS:  Const: negative fever, negative chills, negative weight loss Eyes: negative diplopia or visual changes, negative eye pain ENT: negative coryza, negative sore throat Resp: negative cough, hemoptysis, dyspnea Cards: negative for chest pain, palpitations, lower extremity edema GU: negative for frequency, dysuria and hematuria GI: Negative for abdominal pain, diarrhea, bleeding, constipation Skin: negative for rash and pruritus Heme: negative for easy bruising and gum/nose bleeding MS: left knee cap moves laterally at times Neurolo:negative for headaches, dizziness, vertigo, memory problems  Psych: Anxiety Endocrine: No polyuria or polydipsia allergy/Immunology-as above Objective:  VITALS:  BP (!) 152/89   Pulse 73   Temp 98 F (36.7 C) (Oral)   Resp 16   Ht '6\' 3"'$  (1.905 m)   Wt (!) 344 lb (156 kg)   SpO2 94%   BMI 43.00 kg/m  PHYSICAL EXAM:  General: Alert, cooperative, no distress, appears stated age.  Head: Normocephalic, without obvious abnormality, atraumatic. Eyes: Conjunctivae clear, anicteric sclerae. Pupils are equal ENT not examined due to mask Neck: Supple, symmetrical, no adenopathy, thyroid: non tender no carotid bruit and no JVD. Back: No  CVA tenderness. Lungs: Clear to auscultation bilaterally. No Wheezing or Rhonchi. No rales. Heart: Regular rate and rhythm, no murmur, rub or gallop. Abdomen: Soft, non-tender,not distended. Bowel sounds normal. No masses Extremities: Left  knee swollen surgical scar- has healed well- no erythema or discharge Right knee scar Skin: No rashes or lesions. Or bruising Lymph: Cervical, supraclavicular normal. Neurologic: Grossly non-focal Pertinent Labs Lab Results CBC    Component Value Date/Time   WBC 10.2 01/22/2019 0954   RBC 5.10 01/22/2019 0954   HGB 14.2 01/22/2019 0954   HGB 13.9 01/06/2013 0424   HCT 43.8 01/22/2019 0954   HCT 39.4 (L) 01/06/2013 0424   PLT 351 01/22/2019 0954   PLT 254 01/06/2013 0424   MCV 85.9 01/22/2019 0954   MCV 91 01/06/2013 0424   MCH 27.8 01/22/2019 0954   MCHC 32.4 01/22/2019 0954   RDW 13.0 01/22/2019 0954   RDW 13.4 01/06/2013 0424   LYMPHSABS 1.7 01/22/2019 0954   LYMPHSABS 2.1 01/06/2013 0424   MONOABS 0.6 01/22/2019 0954   MONOABS 0.7 01/06/2013 0424   EOSABS 0.8 (H) 01/22/2019 0954   EOSABS 0.3 01/06/2013 0424   BASOSABS 0.1 01/22/2019 0954   BASOSABS 0.1 01/06/2013 0424    CMP Latest Ref Rng & Units 01/22/2019 12/20/2018 12/02/2018  Glucose 70 - 99 mg/dL 152(H) 154(H) 129(H)  BUN 6 - 20 mg/dL 9 10 <5(L)  Creatinine 0.61 - 1.24 mg/dL 0.75 0.82 0.75  Sodium 135 - 145 mmol/L 139 136 135  Potassium 3.5 - 5.1 mmol/L 3.9 3.8 3.1(L)  Chloride 98 - 111 mmol/L 102 102 98  CO2 22 - 32 mmol/L '26 24 24  '$ Calcium 8.9 - 10.3 mg/dL 9.5 9.4 9.0  Total Protein 6.5 - 8.1 g/dL 7.9 7.6 -  Total Bilirubin 0.3 - 1.2 mg/dL 0.6 0.5 -  Alkaline Phos 38 - 126 U/L 108 112 -  AST 15 - 41 U/L 45(H) 43(H) -  ALT 0 - 44 U/L 38 44 -     ? Impression/Recommendation ? 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 Culture from May 2020 was MSSA but cultures in July was  negative  Repeat cultures in sept 2020 when he had medial retinacular surgery was negative.  After completing 6 weeks of IV antibiotic + rifampin- he has been on Keflex and rifampin. Has been on 4 months + 2 weeks of Rx since his surgery in July.  He does not need long term antibiotic treatment  Will get ESR/CRP/CBC/CMP today and if okay will stop antibiotics Discussed with Dr.Menz   Right TKA in 2017.  Hypertension on amlodipine and Benzapril chlorthalidone.  Hyperlipidemia on Pravachol and gemfibrozil  Anxiety and bipolar on BuSpar, doxepin, duloxetine, paroxetine and as needed Ambien  Follow-up in 4 months ? ___________________________________________________ Discussed with patient, in great detail. Note:  This document was prepared using Dragon voice recognition software and may include unintentional dictation errors.

## 2019-04-11 NOTE — Patient Instructions (Signed)
You are here for follow up of the left knee porsthetic joint infection- You have completed 4 months of Rx after the2nd knee replacement- You will not need the rifampin or keflex any more- will do labs today and talk to Dr.Menz as well see whether you have to be on Doxy Ill see you in 4 months

## 2019-04-16 ENCOUNTER — Telehealth: Payer: Self-pay

## 2019-04-16 NOTE — Telephone Encounter (Signed)
Advised no Abx needed per Ortho and Dr. Delaine Lame. Will keep F/U and also advised labs normal.

## 2019-05-13 ENCOUNTER — Encounter: Payer: Self-pay | Admitting: Student in an Organized Health Care Education/Training Program

## 2019-05-14 ENCOUNTER — Other Ambulatory Visit: Payer: Self-pay

## 2019-05-14 ENCOUNTER — Ambulatory Visit
Payer: 59 | Attending: Student in an Organized Health Care Education/Training Program | Admitting: Student in an Organized Health Care Education/Training Program

## 2019-05-14 ENCOUNTER — Encounter: Payer: Self-pay | Admitting: Student in an Organized Health Care Education/Training Program

## 2019-05-14 DIAGNOSIS — G894 Chronic pain syndrome: Secondary | ICD-10-CM

## 2019-05-14 DIAGNOSIS — T8454XD Infection and inflammatory reaction due to internal left knee prosthesis, subsequent encounter: Secondary | ICD-10-CM | POA: Diagnosis not present

## 2019-05-14 DIAGNOSIS — G608 Other hereditary and idiopathic neuropathies: Secondary | ICD-10-CM | POA: Diagnosis not present

## 2019-05-14 DIAGNOSIS — M47816 Spondylosis without myelopathy or radiculopathy, lumbar region: Secondary | ICD-10-CM

## 2019-05-14 DIAGNOSIS — Z96653 Presence of artificial knee joint, bilateral: Secondary | ICD-10-CM

## 2019-05-14 DIAGNOSIS — M4726 Other spondylosis with radiculopathy, lumbar region: Secondary | ICD-10-CM

## 2019-05-14 DIAGNOSIS — G8929 Other chronic pain: Secondary | ICD-10-CM

## 2019-05-14 DIAGNOSIS — M5416 Radiculopathy, lumbar region: Secondary | ICD-10-CM

## 2019-05-14 MED ORDER — BELBUCA 300 MCG BU FILM: 1.0000 | ORAL_FILM | Freq: Two times a day (BID) | BUCCAL | 1 refills | Status: DC

## 2019-05-14 NOTE — Progress Notes (Signed)
Patient: Nathan Chambers  Service Category: E/M  Provider: Gillis Santa, MD  DOB: Sep 28, 1965  DOS: 05/14/2019  Location: Office  MRN: [id number]  Setting: Ambulatory outpatient  Referring Provider: Juluis Pitch, MD  Type: Established Patient  Specialty: Interventional Pain Management  PCP: Juluis Pitch, MD  Location: Home  Delivery: TeleHealth     Virtual Encounter - Pain Management PROVIDER NOTE: Information contained herein reflects review and annotations entered in association with encounter. Interpretation of such information and data should be left to medically-trained personnel. Information provided to patient can be located elsewhere in the medical record under "Patient Instructions". Document created using STT-dictation technology, any transcriptional errors that may result from process are unintentional.    Contact & Pharmacy Preferred: [phone] Home: [phone] (home) Mobile: [phone] (mobile) E-mail: No e-mail address on record  [facility], [place] [address] Phone: [phone] Fax: [phone]   Pre-screening  Nathan Chambers offered "in-person" vs "virtual" encounter. He indicated preferring virtual for this encounter.   Reason COVID-19*  Social distancing based on CDC and AMA recommendations.   I contacted Nathan Chambers on 05/14/2019 via video conference.      I clearly identified myself as Gillis Santa, MD. I verified that I was speaking with the correct person using two identifiers (Name: Nathan Chambers, and date of birth: 14-Jul-1965).  Consent I sought verbal advanced consent from Nathan Chambers for virtual visit interactions. I informed Nathan Chambers of possible security and privacy concerns, risks, and limitations associated with providing "not-in-person" medical evaluation and management services. I also informed Nathan Chambers of the availability of  "in-person" appointments. Finally, I informed him that there would be a charge for the virtual visit and that he could be  personally, fully or partially, financially responsible for it. Nathan Chambers expressed understanding and agreed to proceed.   Historic Elements   Nathan Chambers is a 54 y.o. year old, male patient evaluated today after his last encounter by our practice on 03/12/2019. Nathan Chambers  has a past medical history of Anxiety, Arthritis, Bipolar disorder ([place]), Chronic kidney disease, Complication of anesthesia, GERD (gastroesophageal reflux disease), Headache, Hemorrhoids, Hyperlipidemia, Hypertension, LFT elevation, and NAFLD (nonalcoholic fatty liver disease). He also  has a past surgical history that includes Colonoscopy; Fracture surgery (Left); Hernia repair; Lipoma excision; Vasectomy; Achilles tendon surgery (Right, 10/30/2015); Total knee arthroplasty (Bilateral, 04/19/2016); Colonoscopy with propofol (N/A, 04/07/2017); Knee arthrotomy (Left, 12/08/2017); I & D knee with poly exchange (Left, 12/08/2017); Total knee revision (Left, 06/11/2018); Joint replacement (Bilateral, 2017); Total knee arthroplasty (Left, 09/11/2018); Total knee revision (Left, 11/27/2018); and Patellar tendon repair (Left, 01/10/2019). Nathan Chambers has a current medication list which includes the following prescription(s): amlodipine-benazepril, buspirone, chlorthalidone, diazepam, duloxetine, folic acid, gemfibrozil, meloxicam, multivitamin with minerals, multivitamin-lutein, paroxetine, pravastatin, vitamin b-12, vitamin c, belbuca, and rifampin. He  reports that he has never smoked. He quit smokeless tobacco use about 7 years ago.  His smokeless tobacco use included chew. He reports that he does not drink alcohol or use drugs. Nathan Chambers is allergic to etodolac.   HPI  Today, he is being contacted for medication management.   At last visit, we increased dose to 450 mcg from 300 mcg BID Patient notes that the dose  increase did not optimize his pain relief.  For some reason he states that the medication has become less  effective.  This is odd because the patient was having analgesic benefit to 300 mcg and we increased dose to try and optimize pain relief but that apparently did not happen. Recommend we decrease dose back to 300 mcg as patient was having some analgesic benefit at this dose.  Pharmacotherapy Assessment  Analgesic:  04/15/2019  1   03/12/2019  Belbuca 450 MCG Film  60.00  30 Bi Lat   [id number]   Wal ([id number])   1  0.90 mg  Comm Ins   [place]   Monitoring: Pharmacotherapy: No side-effects or adverse reactions reported. [place] PMP: PDMP reviewed during this encounter.       Compliance: No problems identified. Effectiveness: Clinically acceptable. Plan: Refer to "POC".  UDS:  Summary  Date Value Ref Range Status  02/13/2019 Note  Final    Comment:    ==================================================================== Compliance Drug Analysis, Ur ==================================================================== Test                             Result       Flag       Units Drug Present and Declared for Prescription Verification   Desmethyldiazepam              538          EXPECTED   ng/mg creat   Oxazepam                       974          EXPECTED   ng/mg creat   Temazepam                      1335         EXPECTED   ng/mg creat    Desmethyldiazepam, oxazepam, and temazepam are expected metabolites    of diazepam. Desmethyldiazepam and oxazepam are also expected    metabolites of other drugs, including chlordiazepoxide, prazepam,    clorazepate, and halazepam. Oxazepam is an expected metabolite of    temazepam. Oxazepam and temazepam are also available as scheduled    prescription medications. Drug Absent but Declared for Prescription Verification   Buprenorphine                  Not Detected UNEXPECTED ng/mg creat   Duloxetine                     Not Detected UNEXPECTED   Paroxetine                      Not Detected UNEXPECTED ==================================================================== Test                      Result    Flag   Units      Ref Range   Creatinine              141              mg/dL      >=20 ==================================================================== Declared Medications:  The flagging and interpretation on this report are based on the  following declared medications.  Unexpected results may arise from  inaccuracies in the declared medications.  **Note: The testing scope of this panel includes these medications:  Buprenorphine  Diazepam  Duloxetine  Paroxetine  **Note: The testing scope of this panel does not include the  following reported medications:  Amlodipine  Benazepril  Buspirone  Cephalexin  Chlorthalidone  Folic Acid  Gemfibrozil  Meloxicam  Multivitamin  Naloxone  Pravastatin  Rifampin  Ropinirole  Vitamin B12  Vitamin C ==================================================================== For clinical consultation, please call [phone]. ====================================================================    Laboratory Chemistry Profile (12 mo)  Renal: 04/11/2019: BUN 11; Creatinine, Ser 0.76  Lab Results  Component Value Date   GFRAA >60 04/11/2019   GFRNONAA >60 04/11/2019   Hepatic: 04/11/2019: Albumin 4.2 Lab Results  Component Value Date   AST 50 (H) 04/11/2019   ALT 39 04/11/2019   Other: 04/11/2019: CRP 1.3; Sed Rate 10 Note: Above Lab results reviewed.  Imaging  DG Knee 1-2 Views Left CLINICAL DATA:  Status post left knee replacement.  EXAM: LEFT KNEE - 1-2 VIEW  COMPARISON:  09/11/2018  FINDINGS: Status post left knee total arthroplasty with expected overlying postop changes and knee joint effusion. No evidence of perihardware fracture or dislocation. Antibiotic packing beads, predominantly about the distal femur.  IMPRESSION: Status post left knee total arthroplasty with  expected overlying postop changes and knee joint effusion. No evidence of perihardware fracture or dislocation. Antibiotic packing beads, predominantly about the distal femur.  Electronically Signed   By: Eddie Candle M.D.   On: 11/27/2018 11:31   Assessment  The primary encounter diagnosis was Chronic pain syndrome. Diagnoses of Sensory polyneuropathy, History of bilateral knee replacement, Infection of total left knee replacement, subsequent encounter, Lumbar facet arthropathy, Lumbar spondylosis, Chronic radicular lumbar pain, and Lumbar radicular pain were also pertinent to this visit.  Plan of Care   Decrease Belbuca from 450 mcg--> 300 mcg BID   I have discontinued Neithan R. Plair's cephALEXin and Belbuca. I am also having him start on Belbuca. Additionally, I am having him maintain his gemfibrozil, vitamin C, folic acid, vitamin 0000000, multivitamin-lutein, amLODipine-benazepril, chlorthalidone, busPIRone, DULoxetine, multivitamin with minerals, pravastatin, PARoxetine, meloxicam, diazepam, and rifampin.  Pharmacotherapy (Medications Ordered): Meds ordered this encounter  Medications  . Buprenorphine HCl (BELBUCA) 300 MCG FILM    Sig: Place 1 Film inside cheek every 12 (twelve) hours. For chronic pain syndrome    Dispense:  60 each    Refill:  1   Follow-up plan:   Return in about 8 weeks (around 07/09/2019) for Medication Management, virtual.       Recent Visits Date Type Provider Dept  03/12/19 Office Visit Gillis Santa, MD [facility]  Showing recent visits within past 90 days and meeting all other requirements   Today's Visits Date Type Provider Dept  05/14/19 Office Visit Gillis Santa, MD [facility]  Showing today's visits and meeting all other requirements   Future Appointments No visits were found meeting these conditions.  Showing future appointments within next 90 days and meeting all other requirements   I discussed the assessment and  treatment plan with the patient. The patient was provided an opportunity to ask questions and all were answered. The patient agreed with the plan and demonstrated an understanding of the instructions.  Patient advised to call back or seek an in-person evaluation if the symptoms or condition worsens.  Total duration of non-face-to-face encounter: 30 minutes.  Note by: Gillis Santa, MD Date: 05/14/2019; Time: 12:31 PM

## 2019-07-08 ENCOUNTER — Telehealth: Payer: Self-pay

## 2019-07-08 NOTE — Telephone Encounter (Signed)
Attempted to call patient for pre virtual appointment questions.  Unable to leave message, no mailbox set up.

## 2019-07-08 NOTE — Telephone Encounter (Signed)
Pt returned nurse call to go over medication

## 2019-07-09 ENCOUNTER — Other Ambulatory Visit: Payer: Self-pay

## 2019-07-09 ENCOUNTER — Encounter: Payer: Self-pay | Admitting: Student in an Organized Health Care Education/Training Program

## 2019-07-09 ENCOUNTER — Ambulatory Visit
Payer: 59 | Attending: Student in an Organized Health Care Education/Training Program | Admitting: Student in an Organized Health Care Education/Training Program

## 2019-07-09 DIAGNOSIS — G608 Other hereditary and idiopathic neuropathies: Secondary | ICD-10-CM

## 2019-07-09 DIAGNOSIS — M9973 Connective tissue and disc stenosis of intervertebral foramina of lumbar region: Secondary | ICD-10-CM

## 2019-07-09 DIAGNOSIS — G894 Chronic pain syndrome: Secondary | ICD-10-CM

## 2019-07-09 DIAGNOSIS — M5416 Radiculopathy, lumbar region: Secondary | ICD-10-CM

## 2019-07-09 DIAGNOSIS — Z96653 Presence of artificial knee joint, bilateral: Secondary | ICD-10-CM

## 2019-07-09 DIAGNOSIS — M48061 Spinal stenosis, lumbar region without neurogenic claudication: Secondary | ICD-10-CM

## 2019-07-09 DIAGNOSIS — T8454XD Infection and inflammatory reaction due to internal left knee prosthesis, subsequent encounter: Secondary | ICD-10-CM

## 2019-07-09 DIAGNOSIS — G8929 Other chronic pain: Secondary | ICD-10-CM

## 2019-07-09 DIAGNOSIS — M4726 Other spondylosis with radiculopathy, lumbar region: Secondary | ICD-10-CM | POA: Diagnosis not present

## 2019-07-09 DIAGNOSIS — M47816 Spondylosis without myelopathy or radiculopathy, lumbar region: Secondary | ICD-10-CM

## 2019-07-09 DIAGNOSIS — X58XXXD Exposure to other specified factors, subsequent encounter: Secondary | ICD-10-CM

## 2019-07-09 MED ORDER — BELBUCA 300 MCG BU FILM: 1.0000 | ORAL_FILM | Freq: Two times a day (BID) | BUCCAL | 2 refills | Status: DC

## 2019-07-09 NOTE — Progress Notes (Signed)
Patient: Nathan Chambers  Service Category: E/M  Provider: Gillis Santa, MD  DOB: 1965-12-02  DOS: 07/09/2019  Location: Office  MRN: [id number]  Setting: Ambulatory outpatient  Referring Provider: Juluis Pitch, MD  Type: Established Patient  Specialty: Interventional Pain Management  PCP: Juluis Pitch, MD  Location: Home  Delivery: TeleHealth     Virtual Encounter - Pain Management PROVIDER NOTE: Information contained herein reflects review and annotations entered in association with encounter. Interpretation of such information and data should be left to medically-trained personnel. Information provided to patient can be located elsewhere in the medical record under "Patient Instructions". Document created using STT-dictation technology, any transcriptional errors that may result from process are unintentional.    Contact & Pharmacy Preferred: [phone] Home: [phone] (home) Mobile: [phone] (mobile) E-mail: No e-mail address on record  [facility] [place], [place] [address] Phone: [phone] Fax: [phone]   Pre-screening  [name] offered "in-person" vs "virtual" encounter. He indicated preferring virtual for this encounter.   Reason COVID-19*  Social distancing based on CDC and AMA recommendations.   I contacted Nathan Chambers on 07/09/2019 via telephone.      I clearly identified myself as Gillis Santa, MD. I verified that I was speaking with the correct person using two identifiers (Name: Nathan Chambers, and date of birth: 15-Sep-1965).  This visit was completed via telephone due to the restrictions of the COVID-19 pandemic. All issues as above were discussed and addressed but no physical exam was performed. If it was felt that the patient should be evaluated in the office, they were directed there. The patient verbally consented to this visit. Patient was unable to  complete an audio/visual visit due to Technical difficulties and/or Lack of internet. Due to the catastrophic nature of the COVID-19 pandemic, this visit was done through audio contact only.  Location of the patient: home address (see Epic for details)  Location of the provider: office Consent I sought verbal advanced consent from Nathan Chambers for virtual visit interactions. I informed Nathan Chambers of possible security and privacy concerns, risks, and limitations associated with providing "not-in-person" medical evaluation and management services. I also informed Nathan Chambers of the availability of "in-person" appointments. Finally, I informed him that there would be a charge for the virtual visit and that he could be  personally, fully or partially, financially responsible for it. Nathan Chambers expressed understanding and agreed to proceed.   Historic Elements   Nathan Chambers is a 54 y.o. year old, male patient evaluated today after his last contact with our practice on 07/08/2019. Nathan Chambers  has a past medical history of Anxiety, Arthritis, Bipolar disorder ([place]), Chronic kidney disease, Complication of anesthesia, GERD (gastroesophageal reflux disease), Headache, Hemorrhoids, Hyperlipidemia, Hypertension, LFT elevation, and NAFLD (nonalcoholic fatty liver disease). He also  has a past surgical history that includes Colonoscopy; Fracture surgery (Left); Hernia repair; Lipoma excision; Vasectomy; Achilles tendon surgery (Right, 10/30/2015); Total knee arthroplasty (Bilateral, 04/19/2016); Colonoscopy with propofol (N/A, 04/07/2017); Knee arthrotomy (Left, 12/08/2017); I & D knee with poly exchange (Left, 12/08/2017); Total knee revision (Left, 06/11/2018); Joint replacement (Bilateral, 2017); Total knee arthroplasty (Left, 09/11/2018); Total knee revision (Left, 11/27/2018); and Patellar tendon repair (Left, 01/10/2019). Nathan Chambers has a current medication list which includes the following prescription(s):  amlodipine-benazepril, [START ON 07/19/2019] belbuca, buspirone, chlorthalidone, diazepam, duloxetine, folic acid, gemfibrozil, meloxicam,  multivitamin with minerals, multivitamin-lutein, paroxetine, pravastatin, rifampin, vitamin b-12, and vitamin c. He  reports that he has never smoked. He quit smokeless tobacco use about 7 years ago.  His smokeless tobacco use included chew. He reports that he does not drink alcohol or use drugs. Nathan Chambers is allergic to etodolac.   HPI  Today, he is being contacted for medication management.   No change in medical history since last visit.  Patient's pain is at baseline. Finds improvement with dose reduction from 450 mcg to 300 mcg BID. Patient continues multimodal pain regimen as prescribed.  States that it provides pain relief and improvement in functional status.  Pharmacotherapy Assessment  Analgesic: 06/21/2019  1   05/14/2019  Belbuca 300 MCG Film  60.00  30 Bi Lat   [id number]   Wal (5798)   1  0.60 mg  Comm Ins   [place]    Monitoring: [place] PMP: PDMP reviewed during this encounter.       Pharmacotherapy: No side-effects or adverse reactions reported. Compliance: No problems identified. Effectiveness: Clinically acceptable. Plan: Refer to "POC".  UDS:  Summary  Date Value Ref Range Status  02/13/2019 Note  Final    Comment:    ==================================================================== Compliance Drug Analysis, Ur ==================================================================== Test                             Result       Flag       Units Drug Present and Declared for Prescription Verification   Desmethyldiazepam              538          EXPECTED   ng/mg creat   Oxazepam                       974          EXPECTED   ng/mg creat   Temazepam                      1335         EXPECTED   ng/mg creat    Desmethyldiazepam, oxazepam, and temazepam are expected metabolites    of diazepam. Desmethyldiazepam and oxazepam are also expected     metabolites of other drugs, including chlordiazepoxide, prazepam,    clorazepate, and halazepam. Oxazepam is an expected metabolite of    temazepam. Oxazepam and temazepam are also available as scheduled    prescription medications. Drug Absent but Declared for Prescription Verification   Buprenorphine                  Not Detected UNEXPECTED ng/mg creat   Duloxetine                     Not Detected UNEXPECTED   Paroxetine                     Not Detected UNEXPECTED ==================================================================== Test                      Result    Flag   Units      Ref Range   Creatinine              141              mg/dL      >=20 ==================================================================== Declared Medications:  The flagging and interpretation on this report are based on the  following declared medications.  Unexpected results may arise from  inaccuracies in the declared medications.  **Note: The testing scope of this panel includes these medications:  Buprenorphine  Diazepam  Duloxetine  Paroxetine  **Note: The testing scope of this panel does not include the  following reported medications:  Amlodipine  Benazepril  Buspirone  Cephalexin  Chlorthalidone  Folic Acid  Gemfibrozil  Meloxicam  Multivitamin  Naloxone  Pravastatin  Rifampin  Ropinirole  Vitamin B12  Vitamin C ==================================================================== For clinical consultation, please call [phone]. ====================================================================    Laboratory Chemistry Profile   Renal Lab Results  Component Value Date   BUN 11 04/11/2019   CREATININE 0.76 04/11/2019   GFRAA >60 04/11/2019   GFRNONAA >60 04/11/2019    Hepatic Lab Results  Component Value Date   AST 50 (H) 04/11/2019   ALT 39 04/11/2019   ALBUMIN 4.2 04/11/2019   ALKPHOS 89 04/11/2019    Electrolytes Lab Results  Component Value Date   NA 138  04/11/2019   K 3.8 04/11/2019   CL 101 04/11/2019   CALCIUM 9.1 04/11/2019   MG 2.4 01/06/2013    Bone No results found for: VD25OH, VD125OH2TOT, LE7517GY1, VC9449QP5, 25OHVITD1, 25OHVITD2, 25OHVITD3, TESTOFREE, TESTOSTERONE  Inflammation (CRP: Acute Phase) (ESR: Chronic Phase) Lab Results  Component Value Date   CRP 1.3 (H) 04/11/2019   ESRSEDRATE 10 04/11/2019      Note: Above Lab results reviewed.  Imaging  DG Knee 1-2 Views Left CLINICAL DATA:  Status post left knee replacement.  EXAM: LEFT KNEE - 1-2 VIEW  COMPARISON:  09/11/2018  FINDINGS: Status post left knee total arthroplasty with expected overlying postop changes and knee joint effusion. No evidence of perihardware fracture or dislocation. Antibiotic packing beads, predominantly about the distal femur.  IMPRESSION: Status post left knee total arthroplasty with expected overlying postop changes and knee joint effusion. No evidence of perihardware fracture or dislocation. Antibiotic packing beads, predominantly about the distal femur.  Electronically Signed   By: Eddie Candle M.D.   On: 11/27/2018 11:31  Assessment  The primary encounter diagnosis was Chronic pain syndrome. Diagnoses of Sensory polyneuropathy, History of bilateral knee replacement, Lumbar facet arthropathy, Infection of total left knee replacement, subsequent encounter, Lumbar spondylosis, Chronic radicular lumbar pain, and Neuroforaminal stenosis of lumbar spine were also pertinent to this visit.  Plan of Care  Nathan Chambers has a current medication list which includes the following long-term medication(s): chlorthalidone, duloxetine, and gemfibrozil.  Pharmacotherapy (Medications Ordered): Meds ordered this encounter  Medications  . Buprenorphine HCl (BELBUCA) 300 MCG FILM    Sig: Place 1 Film inside cheek every 12 (twelve) hours. For chronic pain syndrome    Dispense:  60 each    Refill:  2   Follow-up plan:   Return in about 3  months (around 10/09/2019) for Medication Management.    Recent Visits Date Type Provider Dept  05/14/19 Office Visit Gillis Santa, MD [facility]  Showing recent visits within past 90 days and meeting all other requirements   Today's Visits Date Type Provider Dept  07/09/19 Office Visit Gillis Santa, MD [facility]  Showing today's visits and meeting all other requirements   Future Appointments No visits were found meeting these conditions.  Showing future appointments within next 90 days and meeting all other requirements   I discussed the assessment and treatment plan with the patient. The patient was  provided an opportunity to ask questions and all were answered. The patient agreed with the plan and demonstrated an understanding of the instructions.  Patient advised to call back or seek an in-person evaluation if the symptoms or condition worsens.  Duration of encounter:25 minutes.  Note by: Gillis Santa, MD Date: 07/09/2019; Time: 9:13 AM

## 2019-07-10 ENCOUNTER — Other Ambulatory Visit: Payer: Self-pay

## 2019-07-10 ENCOUNTER — Emergency Department: Payer: 59

## 2019-07-10 ENCOUNTER — Encounter: Payer: Self-pay | Admitting: Medical Oncology

## 2019-07-10 ENCOUNTER — Emergency Department
Admission: EM | Admit: 2019-07-10 | Discharge: 2019-07-10 | Disposition: A | Discharge status: Home / Self Care | Payer: 59 | Attending: Emergency Medicine | Admitting: Emergency Medicine

## 2019-07-10 DIAGNOSIS — N189 Chronic kidney disease, unspecified: Secondary | ICD-10-CM | POA: Diagnosis not present

## 2019-07-10 DIAGNOSIS — K5900 Constipation, unspecified: Secondary | ICD-10-CM

## 2019-07-10 DIAGNOSIS — Z79899 Other long term (current) drug therapy: Secondary | ICD-10-CM | POA: Insufficient documentation

## 2019-07-10 DIAGNOSIS — I129 Hypertensive chronic kidney disease with stage 1 through stage 4 chronic kidney disease, or unspecified chronic kidney disease: Secondary | ICD-10-CM | POA: Diagnosis not present

## 2019-07-10 DIAGNOSIS — Z87891 Personal history of nicotine dependence: Secondary | ICD-10-CM | POA: Diagnosis not present

## 2019-07-10 DIAGNOSIS — Z96652 Presence of left artificial knee joint: Secondary | ICD-10-CM | POA: Diagnosis not present

## 2019-07-10 MED ORDER — MAGNESIUM CITRATE PO SOLN
1.0000 | Freq: Once | ORAL | Status: AC
  Administered 2019-07-10: 1 via ORAL
  Filled 2019-07-10: qty 296

## 2019-07-10 MED ORDER — DOCUSATE SODIUM 50 MG/5ML PO LIQD
100.0000 mg | Freq: Once | ORAL | Status: AC
  Administered 2019-07-10: 100 mg via ORAL
  Filled 2019-07-10: qty 10

## 2019-07-10 NOTE — ED Provider Notes (Signed)
[facility] Emergency Department Provider Note   ____________________________________________   None    (approximate)  I have reviewed the triage vital signs and the nursing notes.   HISTORY  Chief Complaint Constipation   HPI Nathan Chambers is a 54 y.o. male presents to the ED with complaint of constipation.  Patient states that the last BM he had was 8 days ago.  Patient states that he is taken over-the-counter MiraLAX without any relief.  He denies any fever, chills, nausea or vomiting.  He denies any recent surgeries and states last time that he needed to take narcotics was in November.  He reports that he took MiraLAX with 1 bottle of water on Monday and again on Tuesday.  He did not drink a lot of fluids with the MiraLAX.  Today he is extremely uncomfortable.  He rates his pain as a 10/10.       Past Medical History:  Diagnosis Date  . Anxiety   . Arthritis    knees, hands  . Bipolar disorder ([place])   . Chronic kidney disease    arf 2014 due to dehydration  . Complication of anesthesia    woke up during last knee surgery feb 2020  . GERD (gastroesophageal reflux disease)    rare no meds  . Headache   . Hemorrhoids   . Hyperlipidemia   . Hypertension   . LFT elevation   . NAFLD (nonalcoholic fatty liver disease)     Patient Active Problem List   Diagnosis Date Noted  . Chronic pain syndrome 02/01/2019  . Evaluation by psychiatric service required 02/01/2019  . Panic attacks 02/01/2019  . S/P revision of total knee, left 11/27/2018  . Status post revision of total knee 11/27/2018  . BMI 40.0-44.9, adult ([place]) 11/06/2018  . Chronic radicular lumbar pain 08/28/2018  . History of bilateral knee replacement 08/28/2018  . Lumbar radicular pain 08/28/2018  . Lumbar facet arthropathy 08/28/2018  . Spinal stenosis of lumbar region without neurogenic claudication 08/28/2018  . Lumbar spondylosis 08/28/2018  . Neuroforaminal stenosis of  lumbar spine 08/28/2018  . Sensory polyneuropathy 08/28/2018  . Infection of total left knee replacement ([place]) 06/10/2018  . Arthritis, septic, knee ([place]) 12/08/2017  . Hyperglycemia, unspecified 02/16/2016  . Anxiety 01/26/2015  . Hyperlipidemia 01/26/2015  . Hypertension 01/26/2015    Past Surgical History:  Procedure Laterality Date  . ACHILLES TENDON SURGERY Right 10/30/2015   Procedure: ACHILLES TENDON REPAIR/ HEGLAND OSTECTOMY;  Surgeon: [name], DPM;  Location: [facility] OR[facility];  Service: Podiatry;  Laterality: Right;  . COLONOSCOPY    . COLONOSCOPY WITH PROPOFOL N/A 04/07/2017   Procedure: COLONOSCOPY WITH PROPOFOL;  Surgeon: Toledo, Benay Pike, MD;  Location: [facility];  Service: Gastroenterology;  Laterality: N/A;  . FRACTURE SURGERY Left    MIDDLE FINGER  . HERNIA REPAIR    . I & D KNEE WITH POLY EXCHANGE Left 12/08/2017   Procedure: IRRIGATION AND DEBRIDEMENT KNEE WITH POLY EXCHANGE;  Surgeon: Dereck Leep, MD;  Location: [facility] OR[facility];  Service: Orthopedics;  Laterality: Left;  . JOINT REPLACEMENT Bilateral 2017   bilateral knees  . KNEE ARTHROTOMY Left 12/08/2017   Procedure: KNEE ARTHROTOMY, I&D, [place] OUT, POLY EXCHANGE;  Surgeon: Dereck Leep, MD;  Location: [facility] OR[facility];  Service: Orthopedics;  Laterality: Left;  . LIPOMA EXCISION    . PATELLAR TENDON REPAIR Left 01/10/2019   Procedure: LEFT MEDIAL RETINACULAR REPAIR;  Surgeon: Hessie Knows, MD;  Location: [facility];  Service: Orthopedics;  Laterality: Left;  . TOTAL KNEE ARTHROPLASTY Bilateral 04/19/2016   Procedure: TOTAL KNEE BILATERAL;  Surgeon: Hessie Knows, MD;  Location: [facility];  Service: Orthopedics;  Laterality: Bilateral;  . TOTAL KNEE ARTHROPLASTY Left 09/11/2018   Procedure: TOTAL KNEE ARTHROPLASTY - LEFT - HARDWARE REMOVAL WITH INSERTION OF ANTIBIOTIC SPACER;  Surgeon: Hessie Knows, MD;  Location: [facility];  Service: Orthopedics;  Laterality: Left;  . TOTAL KNEE REVISION Left 06/11/2018   Procedure: INCISION  AND DRAINAGE OF LEFT KNEE, POLY EXCHANGE;  Surgeon: Hessie Knows, MD;  Location: [facility];  Service: Orthopedics;  Laterality: Left;  . TOTAL KNEE REVISION Left 11/27/2018   Procedure: TOTAL KNEE REVISION REIMPLANTATION OF LEFT TOTAL KNEE;  Surgeon: Hessie Knows, MD;  Location: [facility];  Service: Orthopedics;  Laterality: Left;  Marland Kitchen VASECTOMY      Prior to Admission medications   Medication Sig Start Date End Date Taking? Authorizing Provider  amLODipine-benazepril (LOTREL) 10-40 MG capsule Take 1 capsule by mouth every morning.  12/06/17   [provider]  Buprenorphine HCl (BELBUCA) 300 MCG FILM Place 1 Film inside cheek every 12 (twelve) hours. For chronic pain syndrome 07/19/19 10/17/19  Gillis Santa, MD  busPIRone (BUSPAR) 5 MG tablet Take 10 mg by mouth 2 (two) times daily.     [provider]  chlorthalidone (HYGROTON) 25 MG tablet Take 12.5 mg by mouth daily. 11/04/17   [provider]  diazepam (VALIUM) 5 MG tablet Take 5 mg by mouth 2 (two) times daily with a meal.    [provider]  DULoxetine (CYMBALTA) 60 MG capsule Take 60 mg by mouth every morning.  05/31/18   [provider]  folic acid (FOLVITE) [id number] MCG tablet Take 800 mcg by mouth daily.     [provider]  gemfibrozil (LOPID) 600 MG tablet Take 600 mg by mouth 2 (two) times daily before a meal.    [provider]  meloxicam (MOBIC) 15 MG tablet Take 15 mg by mouth daily.    [provider]  Multiple Vitamins-Minerals (MULTIVITAMIN WITH MINERALS) tablet Take 1 tablet by mouth daily.    [provider]  multivitamin-lutein (OCUVITE-LUTEIN) CAPS capsule Take 1 capsule by mouth daily.    [provider]  PARoxetine (PAXIL) 20 MG tablet Take 60 mg by mouth daily.     [provider]  pravastatin (PRAVACHOL) 20 MG tablet Take 20 mg by mouth every evening.     [provider]  rifampin (RIFADIN) 300 MG capsule  01/25/19   [provider]  vitamin B-12 (CYANOCOBALAMIN) 500 MCG tablet Take 500 mcg by mouth daily.     [provider]  vitamin C (ASCORBIC ACID) 500 MG tablet Take 500 mg by mouth daily.    [provider]    Allergies Etodolac  Family History  Problem Relation Age of Onset  . Alcohol abuse Maternal Grandfather   . Mental illness Neg Hx     Social History Social History   Tobacco Use  . Smoking status: Never Smoker  . Smokeless tobacco: Former Systems developer    Types: Chew  Substance Use Topics  . Alcohol use: No  . Drug use: No    Review of Systems Constitutional: No fever/chills Cardiovascular: Denies chest pain. Respiratory: Denies shortness of breath. Gastrointestinal: No abdominal pain.  No nausea, no vomiting.  No diarrhea.  Positive constipation. Genitourinary: Negative for dysuria. Musculoskeletal: Negative for muscle aches. Skin: Negative for rash. Neurological: Negative  for headaches, focal weakness or numbness.  ____________________________________________   PHYSICAL EXAM:  VITAL SIGNS: ED Triage Vitals  Enc Vitals Group     BP 07/10/19 0708 136/84     Pulse Rate 07/10/19 0708 (!) 58     Resp 07/10/19 0708 20     Temp 07/10/19 0708 97.9 F (36.6 C)     Temp Source 07/10/19 0708 Oral     SpO2 07/10/19 0708 100 %     Weight 07/10/19 0710 (!) 354 lb (160.6 kg)     Height 07/10/19 0710 6\' 3"  (1.905 m)     Head Circumference --      Peak Flow --      Pain Score 07/10/19 0710 10     Pain Loc --      Pain Edu? --      Excl. in [place]? --    Constitutional: Alert and oriented. Well appearing and in no acute distress.  Morbidly obese. Eyes: Conjunctivae are normal.  Head: Atraumatic. Neck: No stridor.   Cardiovascular: Normal rate, regular rhythm. Grossly normal heart sounds.  Good peripheral circulation. Respiratory: Normal respiratory effort.  No retractions. Lungs CTAB. Gastrointestinal: Soft with diffuse tenderness on palpation.  Mild distention.   No point tenderness or rebound is noted on exam.  Bowel sounds are present throughout. Musculoskeletal:  Neurologic:  Normal speech and language. No gross focal neurologic deficits are appreciated. No gait instability. Skin:  Skin is warm, dry and intact. No rash noted. Psychiatric: Mood and affect are normal. Speech and behavior are normal.  ____________________________________________   LABS (all labs ordered are listed, but only abnormal results are displayed)  Labs Reviewed - No data to display ____________________________________________ RADIOLOGY   Official radiology report(s): DG Abdomen 1 View  Result Date: 07/10/2019 CLINICAL DATA:  Constipation EXAM: ABDOMEN - 1 VIEW COMPARISON:  None. FINDINGS: There is fairly diffuse stool throughout the colon. There is no appreciable bowel dilatation or air-fluid to suggest bowel obstruction. No free air. Lung bases are clear. No abnormal calcification. IMPRESSION: Diffuse stool throughout colon, an appearance indicative of constipation. No bowel obstruction or free air. Electronically Signed   By: Lowella Grip III M.D.   On: 07/10/2019 08:15    ____________________________________________   PROCEDURES  Procedure(s) performed (including Critical Care):  Procedures   ____________________________________________   INITIAL IMPRESSION / ASSESSMENT AND PLAN / ED COURSE  As part of my medical decision making, I reviewed the following data within the electronic MEDICAL RECORD NUMBER Notes from prior ED visits and [place] Controlled Substance Database     54 year old male presents to the ED with complaint of constipation for the last 8 days.  Patient states he has taken some MiraLAX at home without any relief.  In talking with him he has not drank enough fluids with the MiraLAX to have made much of a difference.  Patient denies any fever, chills, nausea or vomiting.  He is not currently taking any narcotics.  X-ray was consistent with  constipation.  Patient was given a "pink elephant".  Patient shortly afterwards was feeling much better and states that he is okay to go home.  Per nursing staff he had a very large BM and patient was discharged.  ____________________________________________   FINAL CLINICAL IMPRESSION(S) / ED DIAGNOSES  Final diagnoses:  Constipation, unspecified constipation type     ED Discharge Orders    None       Note:  This document was prepared using Dragon voice recognition software and  may include unintentional dictation errors.    Johnn Hai, PA-C 07/10/19 1419    Delman Kitten, MD 07/10/19 [phone]

## 2019-07-10 NOTE — ED Triage Notes (Signed)
Pt reports he has not had a BM since last Tuesday. Denies other issues. Has tried otc miralax.

## 2019-07-10 NOTE — Discharge Instructions (Signed)
Follow-up with your primary care provider if any continued problems.  When taking MiraLAX drink at least 16 ounces of water behind it and continued drinking water or fluids frequently during the day.  Also eat foods that are high in fiber and a list of those foods are in your discharge papers.

## 2019-08-13 ENCOUNTER — Other Ambulatory Visit
Admission: RE | Admit: 2019-08-13 | Discharge: 2019-08-13 | Disposition: A | Discharge status: Home / Self Care | Payer: 59 | Attending: Infectious Diseases | Admitting: Infectious Diseases

## 2019-08-13 ENCOUNTER — Ambulatory Visit: Payer: 59 | Attending: Infectious Diseases | Admitting: Infectious Diseases

## 2019-08-13 ENCOUNTER — Encounter: Payer: Self-pay | Admitting: Infectious Diseases

## 2019-08-13 ENCOUNTER — Other Ambulatory Visit: Payer: Self-pay

## 2019-08-13 VITALS — BP 127/84 | HR 81 | Temp 97.5°F | Resp 16 | Ht 75.0 in | Wt 354.0 lb

## 2019-08-13 DIAGNOSIS — I10 Essential (primary) hypertension: Secondary | ICD-10-CM | POA: Insufficient documentation

## 2019-08-13 DIAGNOSIS — Z7902 Long term (current) use of antithrombotics/antiplatelets: Secondary | ICD-10-CM | POA: Insufficient documentation

## 2019-08-13 DIAGNOSIS — R52 Pain, unspecified: Secondary | ICD-10-CM | POA: Insufficient documentation

## 2019-08-13 DIAGNOSIS — Z87891 Personal history of nicotine dependence: Secondary | ICD-10-CM | POA: Insufficient documentation

## 2019-08-13 DIAGNOSIS — F419 Anxiety disorder, unspecified: Secondary | ICD-10-CM | POA: Insufficient documentation

## 2019-08-13 DIAGNOSIS — F329 Major depressive disorder, single episode, unspecified: Secondary | ICD-10-CM | POA: Insufficient documentation

## 2019-08-13 DIAGNOSIS — E785 Hyperlipidemia, unspecified: Secondary | ICD-10-CM | POA: Insufficient documentation

## 2019-08-13 DIAGNOSIS — T8450XD Infection and inflammatory reaction due to unspecified internal joint prosthesis, subsequent encounter: Secondary | ICD-10-CM | POA: Insufficient documentation

## 2019-08-13 DIAGNOSIS — R7401 Elevation of levels of liver transaminase levels: Secondary | ICD-10-CM

## 2019-08-13 DIAGNOSIS — Z7901 Long term (current) use of anticoagulants: Secondary | ICD-10-CM | POA: Insufficient documentation

## 2019-08-13 DIAGNOSIS — Z79899 Other long term (current) drug therapy: Secondary | ICD-10-CM | POA: Insufficient documentation

## 2019-08-13 LAB — C-REACTIVE PROTEIN: CRP: 1.2 mg/dL — ABNORMAL HIGH (ref <1.0)

## 2019-08-13 LAB — SEDIMENTATION RATE: Sed Rate: 10 mm/hr (ref 0–20)

## 2019-08-13 NOTE — Progress Notes (Signed)
NAME: Nathan Chambers  DOB: [birth year]  MRN: [id number]  Date/Time: 08/13/2019 9:46 AM   Subjective:    Follow up visit  Nathan Chambers is a 54 y.o. male with a history of complicated left knee history, hyperlipidemia, hypertension, anxiety and bipolar disorder is here to see me as follow up for the left knee PJI due to MSSA Lasts surgery Sept 10, 2020 to the left knee LATERAL DISLOCATION OF LEFT PATELLA  PROCEDURE:  Procedure(s): LEFT MEDIAL RETINACULAR REPAIR (Left) with lateral release open   Since his last visit in Dec 2020 he has been stable. Still has the left knee dislocation. Mobility is okay, for a short distance- then he has pain left knee which forces him to rest Completed rifampin and keflex in Dec 2020  - ESR and CRP normalized  Now his pain management med is only buprenorphine from 02/12/19- seeing Dr.Lateef  Following part of the note taken from my previous note  . Patient has got a complicated history.  In 2017 he underwent bilateral knee replacement in December.  He was doing well until August 2019 when the left knee got infected secondary to staph aureus and he underwent arthrotomy and irrigation and debridement and was on nafcillin for 6 weeks followed by oral antibiotics.  Then in May 2020 he had to undergo a total knee arthroplasty and removal of the hardware with an antibiotic spacer placement and again received 6 weeks of IV nafcillin.  And on 11/27/2018 he underwent total knee revision reimplantation of the left knee.  Cultures were negative then.  He has been on Keflex and rifampin since then.  But he started to collect serous fluid which was draining from the left knee and also he found that the patella was displaced to the left.  So on 01/10/2019 Dr. Rudene Christians took him for another surgery and did a left medial retinacular repair.  Cultures sent sent from the fluid as well as tissue from the knee did not grow any bacteria nor was any WBCs seen.   01/10/19: LATERAL  DISLOCATION OF LEFT PATELLA, LEFT MEDIAL RETINACULAR REPAIR (Left) with lateral release open- culture negative On keflex and rifampin  7/28 /20 :Total knee revision reimplantation of left knee on 11/27/18.culture negative- on keflex and rifampin since then  09/11/2018 underwent removal of the total knee arthroplasty and had an antibiotic spacer and then took 6 weeks of IV nafcillin which he completed on 10/23/2018  09/03/2018 had aspiration of the left knee and that culture came back as staph aureus  06/08/18 underwent I/Dof the left knee with poly-exchange and tissue culture was negative  12/08/17: left knee arthrotomy and irrigation debridement and polyethylene exchange - MSSA culture - 6 weeks of IV naf followed by oral antibiotics   04/19/2016 : b/l knee replacement  Past Medical History:  Diagnosis Date  . Anxiety   . Arthritis    knees, hands  . Bipolar disorder ([place])   . Chronic kidney disease    arf 2014 due to dehydration  . Complication of anesthesia    woke up during last knee surgery feb 2020  . GERD (gastroesophageal reflux disease)    rare no meds  . Headache   . Hemorrhoids   . Hyperlipidemia   . Hypertension   . LFT elevation   . NAFLD (nonalcoholic fatty liver disease)     Past Surgical History:  Procedure Laterality Date  . ACHILLES TENDON SURGERY Right 10/30/2015   Procedure: ACHILLES TENDON REPAIR/ HEGLAND OSTECTOMY;  Surgeon: [name], DPM;  Location: [facility];  Service: Podiatry;  Laterality: Right;  . COLONOSCOPY    . COLONOSCOPY WITH PROPOFOL N/A 04/07/2017   Procedure: COLONOSCOPY WITH PROPOFOL;  Surgeon: Toledo, Benay Pike, MD;  Location: [facility];  Service: Gastroenterology;  Laterality: N/A;  . FRACTURE SURGERY Left    MIDDLE FINGER  . HERNIA REPAIR    . I & D KNEE WITH POLY EXCHANGE Left 12/08/2017   Procedure: IRRIGATION AND DEBRIDEMENT KNEE WITH POLY EXCHANGE;  Surgeon: Dereck Leep, MD;  Location: [facility];  Service: Orthopedics;   Laterality: Left;  . JOINT REPLACEMENT Bilateral 2017   bilateral knees  . KNEE ARTHROTOMY Left 12/08/2017   Procedure: KNEE ARTHROTOMY, I&D, [place] OUT, POLY EXCHANGE;  Surgeon: Dereck Leep, MD;  Location: [facility];  Service: Orthopedics;  Laterality: Left;  . LIPOMA EXCISION    . PATELLAR TENDON REPAIR Left 01/10/2019   Procedure: LEFT MEDIAL RETINACULAR REPAIR;  Surgeon: Hessie Knows, MD;  Location: [facility];  Service: Orthopedics;  Laterality: Left;  . TOTAL KNEE ARTHROPLASTY Bilateral 04/19/2016   Procedure: TOTAL KNEE BILATERAL;  Surgeon: Hessie Knows, MD;  Location: [facility];  Service: Orthopedics;  Laterality: Bilateral;  . TOTAL KNEE ARTHROPLASTY Left 09/11/2018   Procedure: TOTAL KNEE ARTHROPLASTY - LEFT - HARDWARE REMOVAL WITH INSERTION OF ANTIBIOTIC SPACER;  Surgeon: Hessie Knows, MD;  Location: [facility];  Service: Orthopedics;  Laterality: Left;  . TOTAL KNEE REVISION Left 06/11/2018   Procedure: INCISION AND DRAINAGE OF LEFT KNEE, POLY EXCHANGE;  Surgeon: Hessie Knows, MD;  Location: [facility];  Service: Orthopedics;  Laterality: Left;  . TOTAL KNEE REVISION Left 11/27/2018   Procedure: TOTAL KNEE REVISION REIMPLANTATION OF LEFT TOTAL KNEE;  Surgeon: Hessie Knows, MD;  Location: [facility];  Service: Orthopedics;  Laterality: Left;  Marland Kitchen VASECTOMY      Social History   Socioeconomic History  . Marital status: Married    Spouse name: Not on file  . Number of children: 1  . Years of education: High school  . Highest education level: Not on file  Occupational History  . Not on file  Tobacco Use  . Smoking status: Never Smoker  . Smokeless tobacco: Former Systems developer    Types: Chew  Substance and Sexual Activity  . Alcohol use: No  . Drug use: No  . Sexual activity: Yes  Other Topics Concern  . Not on file  Social History Narrative  . Not on file   Social Determinants of Health   Financial Resource Strain:   . Difficulty of Paying Living Expenses:   Food Insecurity:   .  Worried About Charity fundraiser in the Last Year:   . Arboriculturist in the Last Year:   Transportation Needs:   . Film/video editor (Medical):   Marland Kitchen Lack of Transportation (Non-Medical):   Physical Activity:   . Days of Exercise per Week:   . Minutes of Exercise per Session:   Stress:   . Feeling of Stress :   Social Connections:   . Frequency of Communication with Friends and Family:   . Frequency of Social Gatherings with Friends and Family:   . Attends Religious Services:   . Active Member of Clubs or Organizations:   . Attends Archivist Meetings:   Marland Kitchen Marital Status:   Intimate Partner Violence:   . Fear of Current or Ex-Partner:   . Emotionally Abused:   Marland Kitchen Physically Abused:   .  Sexually Abused:     Family History  Problem Relation Age of Onset  . Alcohol abuse Maternal Grandfather   . Mental illness Neg Hx    Allergies  Allergen Reactions  . Etodolac Itching    Current Outpatient Medications  Medication Sig Dispense Refill  . amLODipine-benazepril (LOTREL) 10-40 MG capsule Take 1 capsule by mouth every morning.     . Buprenorphine HCl (BELBUCA) 300 MCG FILM Place 1 Film inside cheek every 12 (twelve) hours. For chronic pain syndrome 60 each 2  . busPIRone (BUSPAR) 5 MG tablet Take 10 mg by mouth 2 (two) times daily.     . chlorthalidone (HYGROTON) 25 MG tablet Take 12.5 mg by mouth daily.    . diazepam (VALIUM) 5 MG tablet Take 5 mg by mouth 2 (two) times daily with a meal.    . DULoxetine (CYMBALTA) 60 MG capsule Take 60 mg by mouth every morning.     . folic acid (FOLVITE) 638 MCG tablet Take 800 mcg by mouth daily.     Marland Kitchen gemfibrozil (LOPID) 600 MG tablet Take 600 mg by mouth 2 (two) times daily before a meal.    . meloxicam (MOBIC) 15 MG tablet Take 15 mg by mouth daily.    . Multiple Vitamins-Minerals (MULTIVITAMIN WITH MINERALS) tablet Take 1 tablet by mouth daily.    . multivitamin-lutein (OCUVITE-LUTEIN) CAPS capsule Take 1 capsule by mouth  daily.    Marland Kitchen PARoxetine (PAXIL) 20 MG tablet Take 60 mg by mouth daily.     . pravastatin (PRAVACHOL) 20 MG tablet Take 20 mg by mouth every evening.     . vitamin B-12 (CYANOCOBALAMIN) 500 MCG tablet Take 500 mcg by mouth daily.     . vitamin C (ASCORBIC ACID) 500 MG tablet Take 500 mg by mouth daily.     No current facility-administered medications for this visit.     Abtx:  Anti-infectives (From admission, onward)   None      REVIEW OF SYSTEMS:  Const: negative fever, negative chills, negative weight loss Eyes: negative diplopia or visual changes, negative eye pain ENT: negative coryza, negative sore throat Resp: negative cough, hemoptysis, dyspnea Cards: negative for chest pain, palpitations, lower extremity edema GU: negative for frequency, dysuria and hematuria GI: Negative for abdominal pain, diarrhea, bleeding, constipation Skin: negative for rash and pruritus Heme: negative for easy bruising and gum/nose bleeding MS: left knee cap moves laterally at times Neurolo:negative for headaches, dizziness, vertigo, memory problems  Psych: Anxiety Endocrine: No polyuria or polydipsia allergy/Immunology-as above Objective:  VITALS:  BP 127/84   Pulse 81   Temp (!) 97.5 F (36.4 C) (Oral)   Resp 16   Ht '6\' 3"'$  (1.905 m)   Wt (!) 354 lb (160.6 kg)   SpO2 95%   BMI 44.25 kg/m  PHYSICAL EXAM:  General: Alert, cooperative, no distress, appears stated age.  Head: Normocephalic, without obvious abnormality, atraumatic. Eyes: Conjunctivae clear, anicteric sclerae. Pupils are equal ENT not examined due to mask Neck: Supple, symmetrical, no adenopathy, thyroid: non tender no carotid bruit and no JVD. Back: No CVA tenderness. Lungs: Clear to auscultation bilaterally. No Wheezing or Rhonchi. No rales. Heart: Regular rate and rhythm, no murmur, rub or gallop. Abdomen: Soft, non-tender,not distended. Bowel sounds normal. No masses Extremities: Left knee swollen --surgical scar- has  healed well- no erythema or discharge Right knee scar Skin: No rashes or lesions. Or bruising Lymph: Cervical, supraclavicular normal. Neurologic: Grossly non-focal Pertinent Labs Lab Results CBC  Component Value Date/Time   WBC 8.1 04/11/2019 1152   RBC 5.37 04/11/2019 1152   HGB 15.4 04/11/2019 1152   HGB 13.9 01/06/2013 0424   HCT 45.4 04/11/2019 1152   HCT 39.4 (L) 01/06/2013 0424   PLT 263 04/11/2019 1152   PLT 254 01/06/2013 0424   MCV 84.5 04/11/2019 1152   MCV 91 01/06/2013 0424   MCH 28.7 04/11/2019 1152   MCHC 33.9 04/11/2019 1152   RDW 12.9 04/11/2019 1152   RDW 13.4 01/06/2013 0424   LYMPHSABS 1.9 04/11/2019 1152   LYMPHSABS 2.1 01/06/2013 0424   MONOABS 0.6 04/11/2019 1152   MONOABS 0.7 01/06/2013 0424   EOSABS 0.5 04/11/2019 1152   EOSABS 0.3 01/06/2013 0424   BASOSABS 0.1 04/11/2019 1152   BASOSABS 0.1 01/06/2013 0424    CMP Latest Ref Rng & Units 04/11/2019 01/22/2019 12/20/2018  Glucose 70 - 99 mg/dL 167(H) 152(H) 154(H)  BUN 6 - 20 mg/dL '11 9 10  '$ Creatinine 0.61 - 1.24 mg/dL 0.76 0.75 0.82  Sodium 135 - 145 mmol/L 138 139 136  Potassium 3.5 - 5.1 mmol/L 3.8 3.9 3.8  Chloride 98 - 111 mmol/L 101 102 102  CO2 22 - 32 mmol/L '24 26 24  '$ Calcium 8.9 - 10.3 mg/dL 9.1 9.5 9.4  Total Protein 6.5 - 8.1 g/dL 8.3(H) 7.9 7.6  Total Bilirubin 0.3 - 1.2 mg/dL 0.7 0.6 0.5  Alkaline Phos 38 - 126 U/L 89 108 112  AST 15 - 41 U/L 50(H) 45(H) 43(H)  ALT 0 - 44 U/L 39 38 44     ? Impression/Recommendation ? 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 Culture from May 2020 was MSSA but cultures in July was  Negative. Had another surgery Sept 2020 for the dislocated patella. Repeat cultures in sept 2020 when he had medial retinacular surgery was negative.  After completing 6  weeks of IV antibiotic + rifampin- he was then  on Keflex and rifampin until  Dec 2020. As ESR and CRP normalized we stopped all antibiotics in Dec 2020 Pt stable since then. No antibiotics needed . Still the left knee/patella has mechanical issues     Right TKA in 2017.  Hypertension on amlodipine and Benzapril chlorthalidone.  Hyperlipidemia on Pravachol and gemfibrozil  Transaminitis- intermittent- combination of hepatosteatosis/meds ( statin)- followed by his PCP Dr.Bronstein  Anxiety and bipolar on BuSpar, doxepin, duloxetine, paroxetine and as needed Ambien  Follow-up PRN ___________________________________________________ Discussed with patient, in great detail.

## 2019-08-13 NOTE — Patient Instructions (Signed)
Today you are here as follow up of the left PJI- In July the entire hardware was removed- The culture was negative- you took 6 months of antibiotics following that and we stopped in Dec 2020. Last EsR 10 and CRP 1.9 Today we will repeatt the same. Follow up PRN

## 2019-08-28 ENCOUNTER — Other Ambulatory Visit: Payer: Self-pay | Admitting: Student in an Organized Health Care Education/Training Program

## 2019-08-28 ENCOUNTER — Telehealth: Payer: Self-pay | Admitting: Student in an Organized Health Care Education/Training Program

## 2019-08-28 DIAGNOSIS — G894 Chronic pain syndrome: Secondary | ICD-10-CM

## 2019-08-28 MED ORDER — TRAMADOL HCL 50 MG PO TABS: 50.0000 mg | ORAL_TABLET | Freq: Four times a day (QID) | ORAL | 0 refills | Status: AC | PRN

## 2019-08-28 MED ORDER — BUPRENORPHINE 10 MCG/HR TD PTWK: 1.0000 | MEDICATED_PATCH | TRANSDERMAL | 1 refills | Status: DC

## 2019-08-28 NOTE — Telephone Encounter (Signed)
Dr. Holley Raring is going to send in script for Butrans ( as patient's insurance company suggested) but this will also have to be prior authed. In the meantime Dr. Holley Raring has called in some Tramadol in case he runs out of Belbucca in the meantime. Advised patient per Dr. Holley Raring advise to try to only use one of  his remaining Bellbuca per day instead of two.Patient called with this update and understands the plan.

## 2019-08-28 NOTE — Telephone Encounter (Signed)
This patient's Belbucca was denied by his insurance. Please advise.

## 2019-08-28 NOTE — Progress Notes (Signed)
Patient's belbuca was denied by his insurance company.  I recommend the patient continue with buprenorphine therapy.  We will place a prescription for Butrans patch at 10 mcg an hour.

## 2019-08-28 NOTE — Telephone Encounter (Signed)
Patient was told by [facility] he needs PA for [facility].  He will be out of meds in 3 days.

## 2019-09-04 ENCOUNTER — Telehealth: Payer: Self-pay | Admitting: *Deleted

## 2019-09-04 NOTE — Telephone Encounter (Signed)
Buprenorphine denied. Resubmitted per CMM. Patient notified.

## 2019-09-05 ENCOUNTER — Telehealth: Payer: Self-pay

## 2019-09-05 NOTE — Telephone Encounter (Signed)
Spoke with patient and let him know that Dr Holley Raring did in fact send in patches and they have been approved through March 06, 2020.  Patient will call pharmacy to see if they are ready.

## 2019-09-05 NOTE — Telephone Encounter (Signed)
He wants to know if something different was called into the pharmacy yesterday. They told him he had a patch and he didn't know if the doctor changed anything.

## 2019-10-08 ENCOUNTER — Encounter: Payer: Self-pay | Admitting: Student in an Organized Health Care Education/Training Program

## 2019-10-08 ENCOUNTER — Other Ambulatory Visit: Payer: Self-pay

## 2019-10-08 ENCOUNTER — Ambulatory Visit
Payer: 59 | Attending: Student in an Organized Health Care Education/Training Program | Admitting: Student in an Organized Health Care Education/Training Program

## 2019-10-08 VITALS — BP 120/83 | HR 94 | Temp 97.5°F | Resp 20 | Ht 75.0 in | Wt 250.0 lb

## 2019-10-08 DIAGNOSIS — M47816 Spondylosis without myelopathy or radiculopathy, lumbar region: Secondary | ICD-10-CM | POA: Diagnosis present

## 2019-10-08 DIAGNOSIS — G894 Chronic pain syndrome: Secondary | ICD-10-CM | POA: Diagnosis present

## 2019-10-08 DIAGNOSIS — Z96653 Presence of artificial knee joint, bilateral: Secondary | ICD-10-CM | POA: Diagnosis present

## 2019-10-08 DIAGNOSIS — G608 Other hereditary and idiopathic neuropathies: Secondary | ICD-10-CM | POA: Diagnosis not present

## 2019-10-08 DIAGNOSIS — T8454XD Infection and inflammatory reaction due to internal left knee prosthesis, subsequent encounter: Secondary | ICD-10-CM

## 2019-10-08 MED ORDER — BUPRENORPHINE 10 MCG/HR TD PTWK: 1.0000 | MEDICATED_PATCH | TRANSDERMAL | 2 refills | Status: DC

## 2019-10-08 NOTE — Progress Notes (Signed)
PROVIDER NOTE: Information contained herein reflects review and annotations entered in association with encounter. Interpretation of such information and data should be left to medically-trained personnel. Information provided to patient can be located elsewhere in the medical record under "Patient Instructions". Document created using STT-dictation technology, any transcriptional errors that may result from process are unintentional.    Patient: Nathan Chambers  Service Category: E/M  Provider: Gillis Santa, MD  DOB: 1965-11-20  DOS: 10/08/2019  Specialty: Interventional Pain Management  MRN: [id number]  Setting: Ambulatory outpatient  PCP: Nathan Pitch, MD  Type: Established Patient    Referring Provider: Juluis Pitch, MD  Location: Office  Delivery: Face-to-face     HPI  Reason for encounter: [name], a 54 y.o. year old male, is here today for evaluation and management of his Chronic pain syndrome [G89.4]. [name] primary complain today is Back Pain (lower) Last encounter: Practice (09/05/2019). My last encounter with him was on 08/28/2019. Pertinent problems: [name] has Arthritis, septic, knee (Nathan Chambers); Infection of total left knee replacement (Nathan Chambers); Chronic radicular lumbar pain; History of bilateral knee replacement; Lumbar radicular pain; Spinal stenosis of lumbar region without neurogenic claudication; Neuroforaminal stenosis of lumbar spine; S/P revision of total knee, left; Status post revision of total knee; Chronic pain syndrome; and BMI 40.0-44.9, adult ([facility]CC) on their pertinent problem list. Pain Assessment: Severity of Chronic pain is reported as a 4 /10. Location: Back Lower/both legs to the feet, worse on the left. Onset: More than a month ago. Quality: Burning, Aching. Timing: Constant. Modifying factor(s): nothing. Vitals:  height is 6' 3" (1.905 m) and weight is 250 lb (113.4 kg). His temporal temperature is 97.5 F (36.4 C) (abnormal). His blood pressure is  120/83 and his pulse is 94. His respiration is 20 and oxygen saturation is 96%.   Continues to have persistent bilateral knee pain.  Is utilizing Butrans patch at 10 mcg an hour.  States that it is providing mild benefit at times.  He is wondering if he has any other options which we are fairly limited on.  He continues Mobic as needed.  We will refill Butrans patch as below.  Repeat UDS.   Pharmacotherapy Assessment  Analgesic: 10/02/2019  1   08/28/2019  Buprenorphine 10 Mcg/Hr Patch  4.00  28 Bi Lat   [id number]   Nathan Chambers ([id number])   1  0.24 mg  Comm Ins   Nathan Chambers     Monitoring: Nathan Chambers PMP: PDMP reviewed during this encounter.       Pharmacotherapy: No side-effects or adverse reactions reported. Compliance: No problems identified. Effectiveness: Clinically acceptable.  UDS:  Summary  Date Value Ref Range Status  02/13/2019 Note  Final    Comment:    ==================================================================== Compliance Drug Analysis, Ur ==================================================================== Test                             Result       Flag       Units Drug Present and Declared for Prescription Verification   Desmethyldiazepam              538          EXPECTED   ng/mg creat   Oxazepam                       974          EXPECTED   ng/mg creat  Temazepam                      1335         EXPECTED   ng/mg creat    Desmethyldiazepam, oxazepam, and temazepam are expected metabolites    of diazepam. Desmethyldiazepam and oxazepam are also expected    metabolites of other drugs, including chlordiazepoxide, prazepam,    clorazepate, and halazepam. Oxazepam is an expected metabolite of    temazepam. Oxazepam and temazepam are also available as scheduled    prescription medications. Drug Absent but Declared for Prescription Verification   Buprenorphine                  Not Detected UNEXPECTED ng/mg creat   Duloxetine                     Not Detected UNEXPECTED   Paroxetine                      Not Detected UNEXPECTED ==================================================================== Test                      Result    Flag   Units      Ref Range   Creatinine              141              mg/dL      >=20 ==================================================================== Declared Medications:  The flagging and interpretation on this report are based on the  following declared medications.  Unexpected results may arise from  inaccuracies in the declared medications.  **Note: The testing scope of this panel includes these medications:  Buprenorphine  Diazepam  Duloxetine  Paroxetine  **Note: The testing scope of this panel does not include the  following reported medications:  Amlodipine  Benazepril  Buspirone  Cephalexin  Chlorthalidone  Folic Acid  Gemfibrozil  Meloxicam  Multivitamin  Naloxone  Pravastatin  Rifampin  Ropinirole  Vitamin B12  Vitamin C ==================================================================== For clinical consultation, please call [phone]. ====================================================================       ROS  Constitutional: Denies any fever or chills Gastrointestinal: No reported hemesis, hematochezia, vomiting, or acute GI distress Musculoskeletal: Denies any acute onset joint swelling, redness, loss of ROM, or weakness Neurological: No reported episodes of acute onset apraxia, aphasia, dysarthria, agnosia, amnesia, paralysis, loss of coordination, or loss of consciousness  Medication Review  DULoxetine, PARoxetine, amLODipine-benazepril, aspirin EC, buprenorphine, busPIRone, chlorthalidone, diazepam, folic acid, gemfibrozil, meloxicam, multivitamin with minerals, multivitamin-lutein, pravastatin, vitamin B-12, and vitamin C  History Review  Allergy: [name] is allergic to etodolac. Drug: [name]  reports no history of drug use. Alcohol:  reports no history of alcohol use. Tobacco:   reports that he has never smoked. He quit smokeless tobacco use about 7 years ago.  His smokeless tobacco use included chew. Social: [name]  reports that he has never smoked. He quit smokeless tobacco use about 7 years ago.  His smokeless tobacco use included chew. He reports that he does not drink alcohol or use drugs. Medical:  has a past medical history of Anxiety, Arthritis, Bipolar disorder ([place]), Chronic kidney disease, Complication of anesthesia, GERD (gastroesophageal reflux disease), Headache, Hemorrhoids, Hyperlipidemia, Hypertension, LFT elevation, and NAFLD (nonalcoholic fatty liver disease). Surgical: [name]  has a past surgical history that includes Colonoscopy; Fracture surgery (Left); Hernia repair; Lipoma excision; Vasectomy; Achilles tendon surgery (Right, 10/30/2015); Total  knee arthroplasty (Bilateral, 04/19/2016); Colonoscopy with propofol (N/A, 04/07/2017); Knee arthrotomy (Left, 12/08/2017); I & D knee with poly exchange (Left, 12/08/2017); Total knee revision (Left, 06/11/2018); Joint replacement (Bilateral, 2017); Total knee arthroplasty (Left, 09/11/2018); Total knee revision (Left, 11/27/2018); and Patellar tendon repair (Left, 01/10/2019). Family: family history includes Alcohol abuse in his maternal grandfather.  Laboratory Chemistry Profile   Renal Lab Results  Component Value Date   BUN 11 04/11/2019   CREATININE 0.76 04/11/2019   GFRAA >60 04/11/2019   GFRNONAA >60 04/11/2019     Hepatic Lab Results  Component Value Date   AST 50 (H) 04/11/2019   ALT 39 04/11/2019   ALBUMIN 4.2 04/11/2019   ALKPHOS 89 04/11/2019     Electrolytes Lab Results  Component Value Date   NA 138 04/11/2019   K 3.8 04/11/2019   CL 101 04/11/2019   CALCIUM 9.1 04/11/2019   MG 2.4 01/06/2013     Bone No results found for: VD25OH, VD125OH2TOT, PO2423NT6, RW4315QM0, 25OHVITD1, 25OHVITD2, 25OHVITD3, TESTOFREE, TESTOSTERONE   Inflammation (CRP: Acute Phase) (ESR: Chronic  Phase) Lab Results  Component Value Date   CRP 1.2 (H) 08/13/2019   ESRSEDRATE 10 08/13/2019       Note: Above Lab results reviewed.  Recent Imaging Review  DG Abdomen 1 View CLINICAL DATA:  Constipation  EXAM: ABDOMEN - 1 VIEW  COMPARISON:  None.  FINDINGS: There is fairly diffuse stool throughout the colon. There is no appreciable bowel dilatation or air-fluid to suggest bowel obstruction. No free air. Lung bases are clear. No abnormal calcification.  IMPRESSION: Diffuse stool throughout colon, an appearance indicative of constipation. No bowel obstruction or free air.  Electronically Signed   By: Lowella Grip III M.D.   On: 07/10/2019 08:15 Note: Reviewed        Physical Exam  General appearance: Well nourished, well developed, and well hydrated. In no apparent acute distress Mental status: Alert, oriented x 3 (person, place, & time)       Respiratory: No evidence of acute respiratory distress Eyes: PERLA Vitals: BP 120/83    Pulse 94    Temp (!) 97.5 F (36.4 C) (Temporal)    Resp 20    Ht 6' 3" (1.905 m)    Wt 250 lb (113.4 kg)    SpO2 96%    BMI 31.25 kg/m  BMI: Estimated body mass index is 31.25 kg/m as calculated from the following:   Height as of this encounter: 6' 3" (1.905 m).   Weight as of this encounter: 250 lb (113.4 kg). Ideal: Ideal body weight: 84.5 kg (186 lb 4.6 oz) Adjusted ideal body weight: 96.1 kg (211 lb 12.4 oz)  Lumbar Spine Area Exam  Skin & Axial Inspection: No masses, redness, or swelling Alignment: Symmetrical Functional ROM: Pain restricted ROM affecting both sides Stability: No instability detected Muscle Tone/Strength: Functionally intact. No obvious neuro-muscular anomalies detected. Sensory (Neurological): Dermatomal pain pattern and musculoskeletal Palpation: No palpable anomalies        Gait & Posture Assessment  Ambulation: Patient came in today in a wheel chair Gait: Significantly limited. Dependent on assistive  device to ambulate Posture: Difficulty standing up straight, due to pain  Lower Extremity Exam    Side: Right lower extremity  Side: Left lower extremity  Stability: No instability observed          Stability: No instability observed          Skin & Extremity Inspection: Evidence of prior arthroplastic surgery  Skin & Extremity Inspection: Evidence of prior arthroplastic surgery  Functional ROM: Pain restricted ROM for all joints of the lower extremity          Functional ROM: Pain restricted ROM for all joints of the lower extremity          Muscle Tone/Strength: Functionally intact. No obvious neuro-muscular anomalies detected.  Muscle Tone/Strength: Functionally intact. No obvious neuro-muscular anomalies detected.  Sensory (Neurological): Arthropathic arthralgia        Sensory (Neurological): Arthropathic arthralgia        DTR: Patellar: deferred today Achilles: deferred today Plantar: deferred today  DTR: Patellar: deferred today Achilles: deferred today Plantar: deferred today  Palpation: No palpable anomalies  Palpation: No palpable anomalies    Assessment   Status Diagnosis  Controlled Controlled Controlled 1. Chronic pain syndrome   2. Sensory polyneuropathy   3. History of bilateral knee replacement   4. Lumbar facet arthropathy   5. Infection of total left knee replacement, subsequent encounter   6. Lumbar spondylosis      Updated Problems: Problem  Chronic Pain Syndrome  S/P Revision of Total Knee, Left  Status Post Revision of Total Knee  Bmi 40.0-44.9, Adult ([facility])  Chronic Radicular Lumbar Pain  History of Bilateral Knee Replacement  Lumbar Radicular Pain  Spinal Stenosis of Lumbar Region Without Neurogenic Claudication  Neuroforaminal Stenosis of Lumbar Spine  Infection of Total Left Knee Replacement ([facility])  Arthritis, Septic, Knee ([facility])    Plan of Care  Nathan Chambers has a current medication list which includes the following long-term  medication(s): chlorthalidone, duloxetine, and gemfibrozil.  Pharmacotherapy (Medications Ordered): Meds ordered this encounter  Medications   buprenorphine (BUTRANS) 10 MCG/HR PTWK    Sig: Place 1 patch onto the skin every 7 (seven) days. For chronic pain syndrome    Dispense:  4 patch    Refill:  2    Do not place this medication, or any other prescription from our practice, on "Automatic Refill". Patient may have prescription filled one day early if pharmacy is closed on scheduled refill date.   Orders:  Orders Placed This Encounter  Procedures   ToxASSURE Select 13 (MW), Urine    Volume: 30 ml(s). Minimum 3 ml of urine is needed. Document temperature of fresh sample. Indications: Long term (current) use of opiate analgesic (D97.416)   Follow-up plan:   Return in about 14 weeks (around 01/14/2020) for Medication Management, in person.    Recent Visits No visits were found meeting these conditions.  Showing recent visits within past 90 days and meeting all other requirements   Today's Visits Date Type Provider Dept  10/08/19 Office Visit Nathan Santa, MD [facility]  Showing today's visits and meeting all other requirements   Future Appointments No visits were found meeting these conditions.  Showing future appointments within next 90 days and meeting all other requirements   I discussed the assessment and treatment plan with the patient. The patient was provided an opportunity to ask questions and all were answered. The patient agreed with the plan and demonstrated an understanding of the instructions.  Patient advised to call back or seek an in-person evaluation if the symptoms or condition worsens.  Duration of encounter: 31 minutes.  Note by: Nathan Santa, MD Date: 10/08/2019; Time: 11:14 AM

## 2019-10-08 NOTE — Progress Notes (Signed)
Nursing Pain Medication Assessment:  Safety precautions to be maintained throughout the outpatient stay will include: orient to surroundings, keep bed in low position, maintain call bell within reach at all times, provide assistance with transfer out of bed and ambulation.  Medication Inspection Compliance: Nathan Chambers did not comply with our request to bring his pills to be counted. He was reminded that bringing the medication bottles, even when empty, is a requirement.  Medication: None brought in. Pill/Patch Count: None available to be counted. Bottle Appearance: No container available. Did not bring bottle(s) to appointment. Filled Date: N/A Last Medication intake:  today

## 2019-10-08 NOTE — Patient Instructions (Signed)
A prescription for Butrans patch has been sent to your pharmacy.

## 2019-10-12 LAB — TOXASSURE SELECT 13 (MW), URINE

## 2020-01-14 ENCOUNTER — Ambulatory Visit
Payer: 59 | Attending: Student in an Organized Health Care Education/Training Program | Admitting: Student in an Organized Health Care Education/Training Program

## 2020-01-14 ENCOUNTER — Encounter: Payer: Self-pay | Admitting: Student in an Organized Health Care Education/Training Program

## 2020-01-14 ENCOUNTER — Other Ambulatory Visit: Payer: Self-pay

## 2020-01-14 VITALS — BP 136/82 | HR 89 | Temp 97.5°F | Resp 18 | Ht 75.0 in | Wt 333.0 lb

## 2020-01-14 DIAGNOSIS — G608 Other hereditary and idiopathic neuropathies: Secondary | ICD-10-CM | POA: Insufficient documentation

## 2020-01-14 DIAGNOSIS — T8454XD Infection and inflammatory reaction due to internal left knee prosthesis, subsequent encounter: Secondary | ICD-10-CM | POA: Diagnosis present

## 2020-01-14 DIAGNOSIS — M48061 Spinal stenosis, lumbar region without neurogenic claudication: Secondary | ICD-10-CM

## 2020-01-14 DIAGNOSIS — M9973 Connective tissue and disc stenosis of intervertebral foramina of lumbar region: Secondary | ICD-10-CM | POA: Insufficient documentation

## 2020-01-14 DIAGNOSIS — G894 Chronic pain syndrome: Secondary | ICD-10-CM | POA: Diagnosis not present

## 2020-01-14 DIAGNOSIS — Z96653 Presence of artificial knee joint, bilateral: Secondary | ICD-10-CM | POA: Diagnosis present

## 2020-01-14 DIAGNOSIS — G8929 Other chronic pain: Secondary | ICD-10-CM | POA: Diagnosis present

## 2020-01-14 DIAGNOSIS — M5416 Radiculopathy, lumbar region: Secondary | ICD-10-CM | POA: Diagnosis present

## 2020-01-14 DIAGNOSIS — M47816 Spondylosis without myelopathy or radiculopathy, lumbar region: Secondary | ICD-10-CM | POA: Insufficient documentation

## 2020-01-14 MED ORDER — BUPRENORPHINE 15 MCG/HR TD PTWK: 1.0000 | MEDICATED_PATCH | TRANSDERMAL | 3 refills | Status: DC

## 2020-01-14 NOTE — Progress Notes (Signed)
PROVIDER NOTE: Information contained herein reflects review and annotations entered in association with encounter. Interpretation of such information and data should be left to medically-trained personnel. Information provided to patient can be located elsewhere in the medical record under "Patient Instructions". Document created using STT-dictation technology, any transcriptional errors that may result from process are unintentional.    Patient: Nathan Chambers  Service Category: E/M  Provider: Gillis Santa, MD  DOB: Oct 03, 1965  DOS: 01/14/2020  Specialty: Interventional Pain Management  MRN: [id number]  Setting: Ambulatory outpatient  PCP: Juluis Pitch, MD  Type: Established Patient    Referring Provider: Juluis Pitch, MD  Location: Office  Delivery: Face-to-face     HPI  Reason for encounter: [name], a 54 y.o. year old male, is here today for evaluation and management of his Chronic pain syndrome [G89.4]. Nathan Chambers primary complain today is Back Pain (lower) Last encounter: Practice (10/08/2019). My last encounter with him was on 10/08/2019. Pertinent problems: Nathan Chambers has Arthritis, septic, knee ([place]); Infection of total left knee replacement ([place]); Chronic radicular lumbar pain; History of bilateral knee replacement; Lumbar radicular pain; Spinal stenosis of lumbar region without neurogenic claudication; Neuroforaminal stenosis of lumbar spine; S/P revision of total knee, left; Status post revision of total knee; Chronic pain syndrome; and BMI 40.0-44.9, adult (HCC) on their pertinent problem list. Pain Assessment: Severity of Chronic pain is reported as a 4 /10. Location: Back Lower/down left leg to feet, including all toes on left side. Onset:  . Quality: Burning, Sharp. Timing:  . Modifying factor(s): buprenorphine. Vitals:  height is 6' 3" (1.905 m) and weight is 333 lb (151 kg) (abnormal). His temporal temperature is 97.5 F (36.4 C) (abnormal). His blood pressure is  136/82 and his pulse is 89. His respiration is 18 and oxygen saturation is 96%.   Patient is doing well overall. States that he was without patches for sometime due to pharmacy supply issue and noticed a significant increase in his pain. He states that patches are helpful and provide him with approximately 70% pain relief.  He states that he does have days when he has increased pain. He states that he lost about 20 lbs, reduced portion sizes, eating healthier. Was going to gym, having increased left knee pain and instability. Seeing Dr Rudene Christians next month.  Is endorsing symptoms of depression, is being monitored by Dr. Lovie Macadamia.  Has not seen a psychiatrist next but that may be the next step.  Pharmacotherapy Assessment   01/01/2020  1   10/08/2019  Buprenorphine 10 Mcg/Hr Patch  4.00  28 Bi Lat   [id number]   [facility] (5798)   2/2  0.24 mg  Comm Ins   [place]     Analgesic: Butrans patch 10 mcg/hr   Monitoring: [place] PMP: PDMP reviewed during this encounter.       Pharmacotherapy: No side-effects or adverse reactions reported. Compliance: No problems identified. Effectiveness: Clinically acceptable.  Nathan Patience, RN  01/14/2020  8:18 AM  Sign when Signing Visit Nursing Pain Medication Assessment:  Safety precautions to be maintained throughout the outpatient stay will include: orient to surroundings, keep bed in low position, maintain call bell within reach at all times, provide assistance with transfer out of bed and ambulation.  Medication Inspection Compliance: Pill count conducted under aseptic conditions, in front of the patient. Neither the pills nor the bottle was removed from the patient's sight at any time. Once count was completed pills were immediately returned to the patient  in their original bottle.  Medication: Buprenorphine (Suboxone) Pill/Patch Count: 2 of 4 patches remain Pill/Patch Appearance: Markings consistent with prescribed medication Bottle Appearance: Standard pharmacy  container. Clearly labeled. Filled Date: 09 / 01 / 2021 Last Medication intake:  01/10/2020    UDS:  Summary  Date Value Ref Range Status  10/08/2019 Note  Final    Comment:    ==================================================================== [employer] (MW) ==================================================================== Test                             Result       Flag       Units  Drug Present and Declared for Prescription Verification   Desmethyldiazepam              438          EXPECTED   ng/mg creat   Oxazepam                       749          EXPECTED   ng/mg creat   Temazepam                      >877         EXPECTED   ng/mg creat    Desmethyldiazepam, oxazepam, and temazepam are expected metabolites    of diazepam. Desmethyldiazepam and oxazepam are also expected    metabolites of other drugs, including chlordiazepoxide, prazepam,    clorazepate, and halazepam. Oxazepam is an expected metabolite of    temazepam. Oxazepam and temazepam are also available as scheduled    prescription medications.    Buprenorphine                  4            EXPECTED   ng/mg creat    Sources of buprenorphine include scheduled prescription medications.  ==================================================================== Test                      Result    Flag   Units      Ref Range   Creatinine              228              mg/dL      >=20 ==================================================================== Declared Medications:  The flagging and interpretation on this report are based on the  following declared medications.  Unexpected results may arise from  inaccuracies in the declared medications.   **Note: The testing scope of this panel includes these medications:   Diazepam (Valium)   **Note: The testing scope of this panel does not include small to  moderate amounts of these reported medications:   Buprenorphine Patch (BuTrans)   **Note: The testing  scope of this panel does not include the  following reported medications:   Amlodipine (Lotrel)  Aspirin  Benazepril (Lotrel)  Buspirone (Buspar)  Chlorthalidone (Hygroton)  Cyanocobalamin  Duloxetine (Cymbalta)  Folic Acid  Gemfibrozil (Lopid)  Iron  Meloxicam (Mobic)  Multivitamin  Paroxetine (Paxil)  Pravastatin (Pravachol)  Vitamin C ==================================================================== For clinical consultation, please call [phone]. ====================================================================      ROS  Constitutional: Denies any fever or chills Gastrointestinal: No reported hemesis, hematochezia, vomiting, or acute GI distress Musculoskeletal: left knee pain, low back pain Neurological: No reported episodes of acute onset apraxia, aphasia, dysarthria, agnosia,  amnesia, paralysis, loss of coordination, or loss of consciousness  Medication Review  DULoxetine, PARoxetine, amLODipine-benazepril, aspirin EC, buprenorphine, busPIRone, chlorthalidone, diazepam, folic acid, gemfibrozil, meloxicam, multivitamin with minerals, multivitamin-lutein, pravastatin, vitamin B-12, and vitamin C  History Review  Allergy: Mr. Manor is allergic to etodolac. Drug: Mr. Yepiz  reports no history of drug use. Alcohol:  reports no history of alcohol use. Tobacco:  reports that he has never smoked. He quit smokeless tobacco use about 8 years ago.  His smokeless tobacco use included chew. Social: Mr. Bialas  reports that he has never smoked. He quit smokeless tobacco use about 8 years ago.  His smokeless tobacco use included chew. He reports that he does not drink alcohol and does not use drugs. Medical:  has a past medical history of Anxiety, Arthritis, Bipolar disorder ([place]), Chronic kidney disease, Complication of anesthesia, GERD (gastroesophageal reflux disease), Headache, Hemorrhoids, Hyperlipidemia, Hypertension, LFT elevation, and NAFLD (nonalcoholic fatty  liver disease). Surgical: Mr. Eaddy  has a past surgical history that includes Colonoscopy; Fracture surgery (Left); Hernia repair; Lipoma excision; Vasectomy; Achilles tendon surgery (Right, 10/30/2015); Total knee arthroplasty (Bilateral, 04/19/2016); Colonoscopy with propofol (N/A, 04/07/2017); Knee arthrotomy (Left, 12/08/2017); I & D knee with poly exchange (Left, 12/08/2017); Total knee revision (Left, 06/11/2018); Joint replacement (Bilateral, 2017); Total knee arthroplasty (Left, 09/11/2018); Total knee revision (Left, 11/27/2018); and Patellar tendon repair (Left, 01/10/2019). Family: family history includes Alcohol abuse in his maternal grandfather.  Laboratory Chemistry Profile   Renal Lab Results  Component Value Date   BUN 11 04/11/2019   CREATININE 0.76 04/11/2019   GFRAA >60 04/11/2019   GFRNONAA >60 04/11/2019     Hepatic Lab Results  Component Value Date   AST 50 (H) 04/11/2019   ALT 39 04/11/2019   ALBUMIN 4.2 04/11/2019   ALKPHOS 89 04/11/2019     Electrolytes Lab Results  Component Value Date   NA 138 04/11/2019   K 3.8 04/11/2019   CL 101 04/11/2019   CALCIUM 9.1 04/11/2019   MG 2.4 01/06/2013     Bone No results found for: VD25OH, VD125OH2TOT, SN0539JQ7, HA1937TK2, 25OHVITD1, 25OHVITD2, 25OHVITD3, TESTOFREE, TESTOSTERONE   Inflammation (CRP: Acute Phase) (ESR: Chronic Phase) Lab Results  Component Value Date   CRP 1.2 (H) 08/13/2019   ESRSEDRATE 10 08/13/2019       Note: Above Lab results reviewed.  Recent Imaging Review  DG Abdomen 1 View CLINICAL DATA:  Constipation  EXAM: ABDOMEN - 1 VIEW  COMPARISON:  None.  FINDINGS: There is fairly diffuse stool throughout the colon. There is no appreciable bowel dilatation or air-fluid to suggest bowel obstruction. No free air. Lung bases are clear. No abnormal calcification.  IMPRESSION: Diffuse stool throughout colon, an appearance indicative of constipation. No bowel obstruction or free  air.  Electronically Signed   By: Lowella Grip III M.D.   On: 07/10/2019 08:15 Note: Reviewed        Physical Exam  General appearance: Well nourished, well developed, and well hydrated. In no apparent acute distress Mental status: Alert, oriented x 3 (person, place, & time)       Respiratory: No evidence of acute respiratory distress Eyes: PERLA Vitals: BP 136/82    Pulse 89    Temp (!) 97.5 F (36.4 C) (Temporal)    Resp 18    Ht 6' 3" (1.905 m)    Wt (!) 333 lb (151 kg)    SpO2 96%    BMI 41.62 kg/m  BMI: Estimated body mass index  is 41.62 kg/m as calculated from the following:   Height as of this encounter: 6' 3" (1.905 m).   Weight as of this encounter: 333 lb (151 kg). Ideal: Ideal body weight: 84.5 kg (186 lb 4.6 oz) Adjusted ideal body weight: 111.1 kg (244 lb 15.6 oz)   Lumbar Spine Area Exam  Skin & Axial Inspection: No masses, redness, or swelling Alignment: Symmetrical Functional ROM: Pain restricted ROM affecting both sides Stability: No instability detected Muscle Tone/Strength: Functionally intact. No obvious neuro-muscular anomalies detected. Sensory (Neurological): Dermatomal pain pattern and musculoskeletal Palpation: No palpable anomalies        Gait & Posture Assessment  Ambulation: Patient came in today in a wheel chair Gait: Significantly limited. Dependent on assistive device to ambulate Posture: Difficulty standing up straight, due to pain  Lower Extremity Exam    Side: Right lower extremity  Side: Left lower extremity  Stability: No instability observed          Stability: No instability observed          Skin & Extremity Inspection: Evidence of prior arthroplastic surgery  Skin & Extremity Inspection: Evidence of prior arthroplastic surgery  Functional ROM: Pain restricted ROM for all joints of the lower extremity          Functional ROM: Pain restricted ROM for all joints of the lower extremity          Muscle Tone/Strength:  Functionally intact. No obvious neuro-muscular anomalies detected.  Muscle Tone/Strength: Functionally intact. No obvious neuro-muscular anomalies detected.  Sensory (Neurological): Arthropathic arthralgia        Sensory (Neurological): Arthropathic arthralgia        DTR: Patellar: deferred today Achilles: deferred today Plantar: deferred today  DTR: Patellar: deferred today Achilles: deferred today Plantar: deferred today  Palpation: No palpable anomalies  Palpation: No palpable anomalies     Assessment   Status Diagnosis  Controlled Controlled Controlled 1. Chronic pain syndrome   2. Sensory polyneuropathy   3. History of bilateral knee replacement   4. Lumbar facet arthropathy   5. Infection of total left knee replacement, subsequent encounter   6. Lumbar spondylosis   7. Chronic radicular lumbar pain   8. Neuroforaminal stenosis of lumbar spine        Plan of Care  Mr. BESNIK FEBUS has a current medication list which includes the following long-term medication(s): chlorthalidone, duloxetine, and gemfibrozil.  To optimize patient's pain relief, we discussed increasing patient's Butrans patch from 10 mcg an hour to 50 mcg an hour.  I also gave him the option of adding alpha lipoic acid to help out with his lower extremity paresthesias related to sensory polyneuropathy.  He states that he would like to try the increase in Butrans patch first.  Prescription as below.  Patient instructed to continue Cymbalta as prescribed.  Continue depression management with Dr. Lovie Macadamia.   Pharmacotherapy (Medications Ordered): Meds ordered this encounter  Medications   buprenorphine (BUTRANS) 15 MCG/HR    Sig: Place 1 patch onto the skin every 7 (seven) days.    Dispense:  4 patch    Refill:  3    Do not place this medication, or any other prescription from our practice, on "Automatic Refill". Patient may have prescription filled one day early if pharmacy is closed on scheduled  refill date.    Follow-up plan:   Return in about 4 months (around 05/15/2020) for Medication Management, in person.   Recent Visits No visits were found  meeting these conditions. Showing recent visits within past 90 days and meeting all other requirements Today's Visits Date Type Provider Dept  01/14/20 Office Visit Gillis Santa, MD [facility]  Showing today's visits and meeting all other requirements Future Appointments No visits were found meeting these conditions. Showing future appointments within next 90 days and meeting all other requirements  I discussed the assessment and treatment plan with the patient. The patient was provided an opportunity to ask questions and all were answered. The patient agreed with the plan and demonstrated an understanding of the instructions.  Patient advised to call back or seek an in-person evaluation if the symptoms or condition worsens.  Duration of encounter: 30 minutes.  Note by: Gillis Santa, MD Date: 01/14/2020; Time: 9:10 AM

## 2020-01-14 NOTE — Patient Instructions (Addendum)
1. Increase Butrans patch to 15 mcg. Hr 2. Consider adding Alpha lipoic acid (OTC) 600 mg daily   3. Butrans patches to last until 05-15-20 have been escribed to your pharmacy.

## 2020-01-14 NOTE — Progress Notes (Signed)
Nursing Pain Medication Assessment:  Safety precautions to be maintained throughout the outpatient stay will include: orient to surroundings, keep bed in low position, maintain call bell within reach at all times, provide assistance with transfer out of bed and ambulation.  Medication Inspection Compliance: Pill count conducted under aseptic conditions, in front of the patient. Neither the pills nor the bottle was removed from the patient's sight at any time. Once count was completed pills were immediately returned to the patient in their original bottle.  Medication: Buprenorphine (Suboxone) Pill/Patch Count: 2 of 4 patches remain Pill/Patch Appearance: Markings consistent with prescribed medication Bottle Appearance: Standard pharmacy container. Clearly labeled. Filled Date: 09 / 01 / 2021 Last Medication intake:  01/10/2020

## 2020-01-22 IMAGING — CR DG KNEE COMPLETE 4+V*L*
1 series · 4 of 4 positions shown · non-contrast
Comparison: April 19, 2016

CLINICAL DATA: Pain following fall

EXAM:
LEFT KNEE - COMPLETE 4+ VIEW

[Series 1: dg knee complete 4 views left · 0.14mm/px · 4 of 4 slices shown]
[im 1/4]
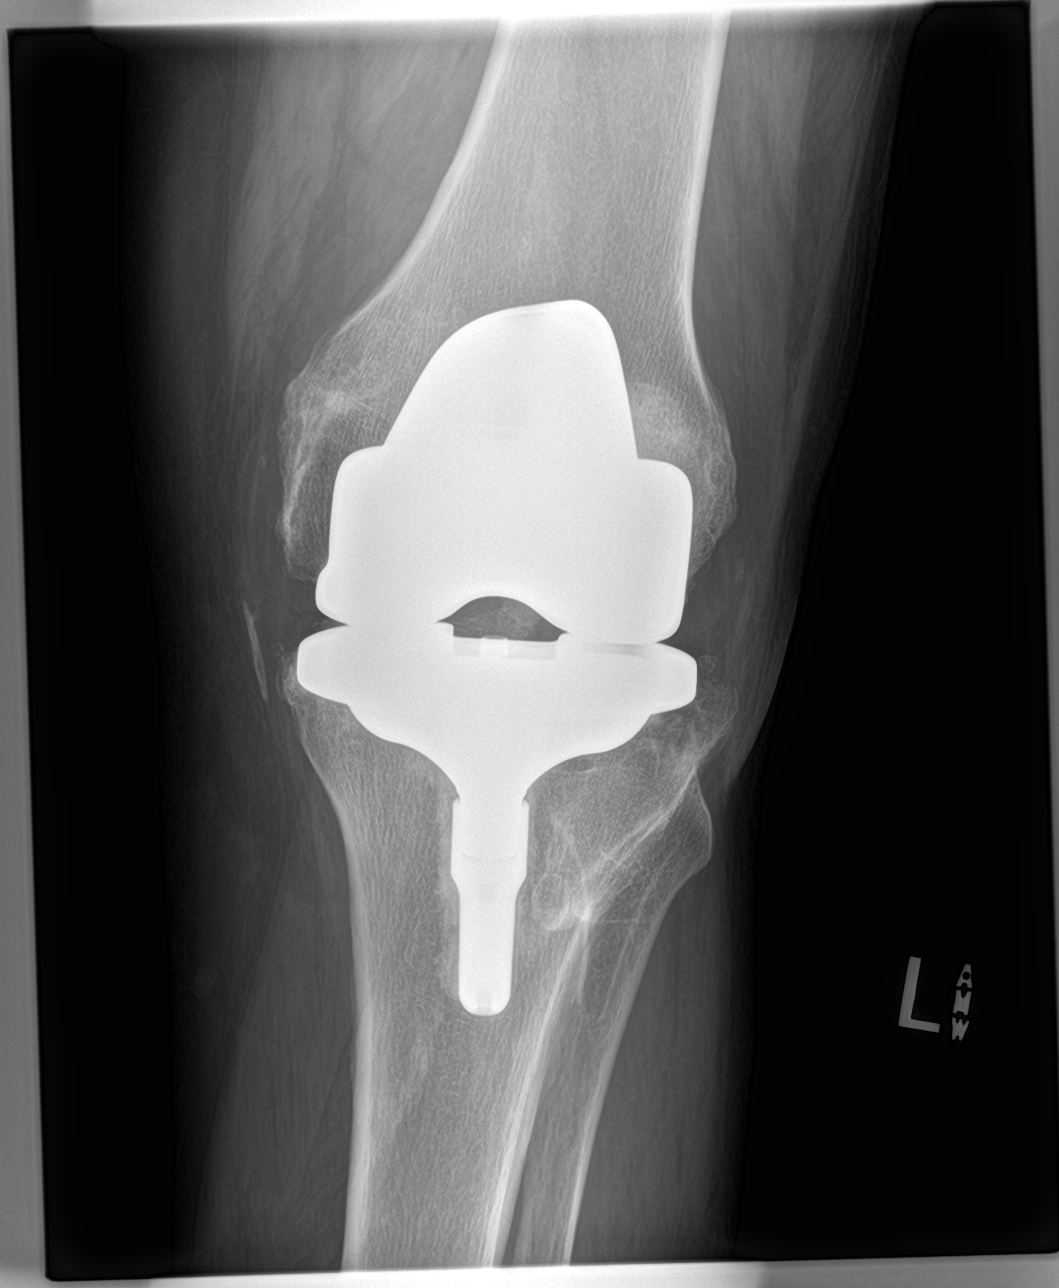
[im 2/4]
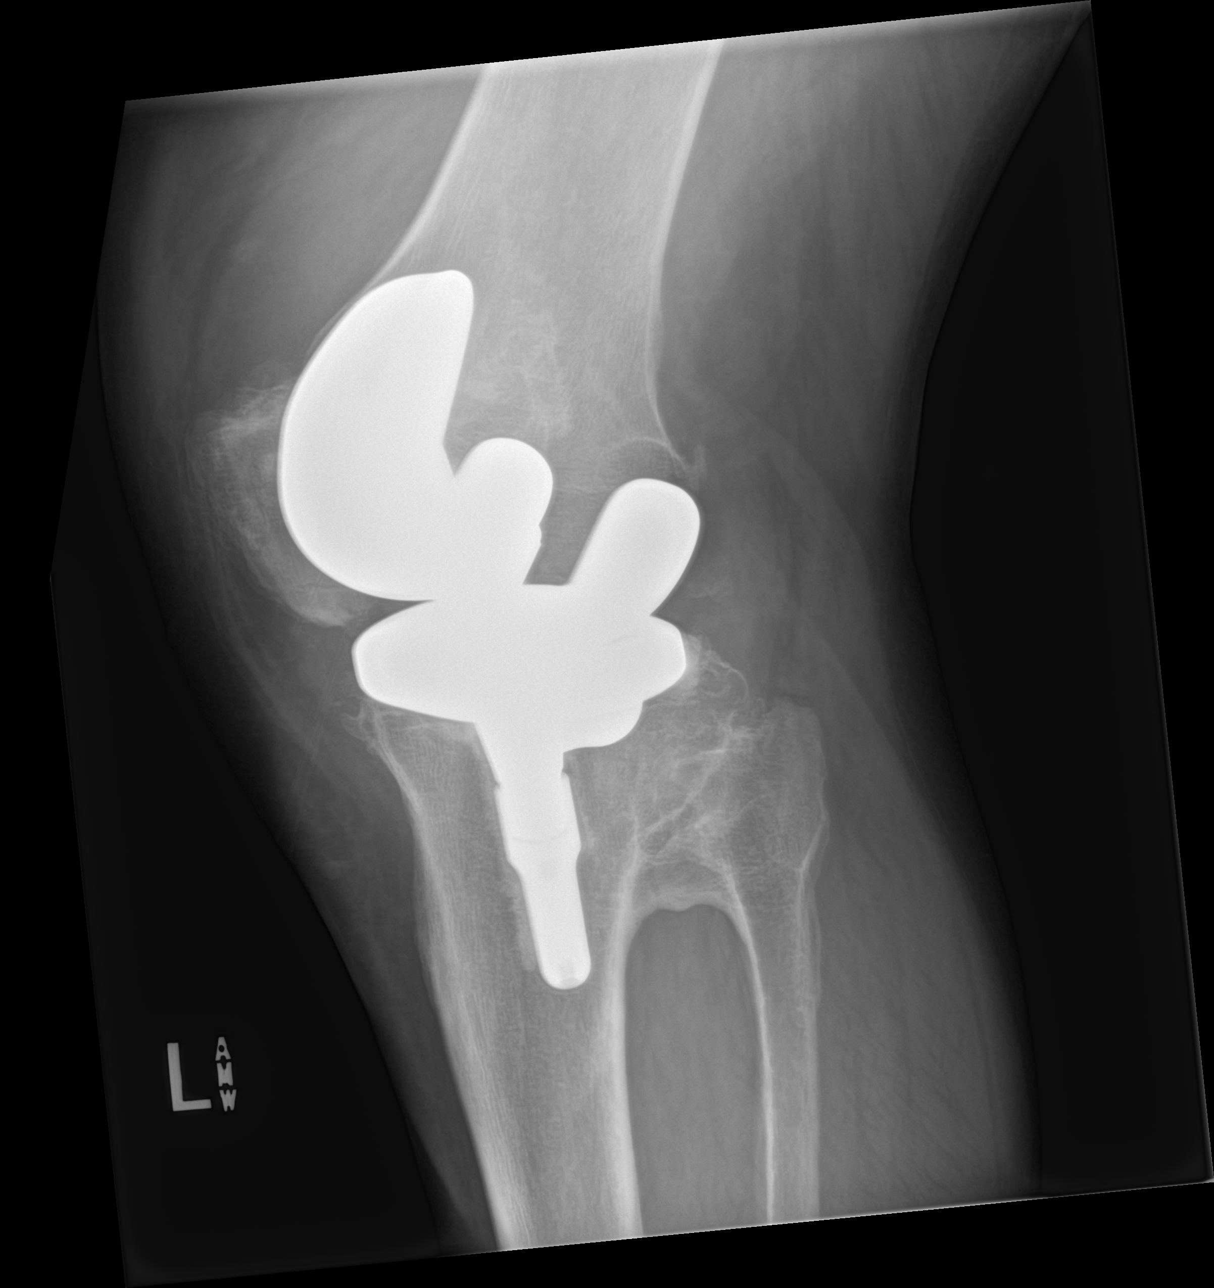
[im 3/4]
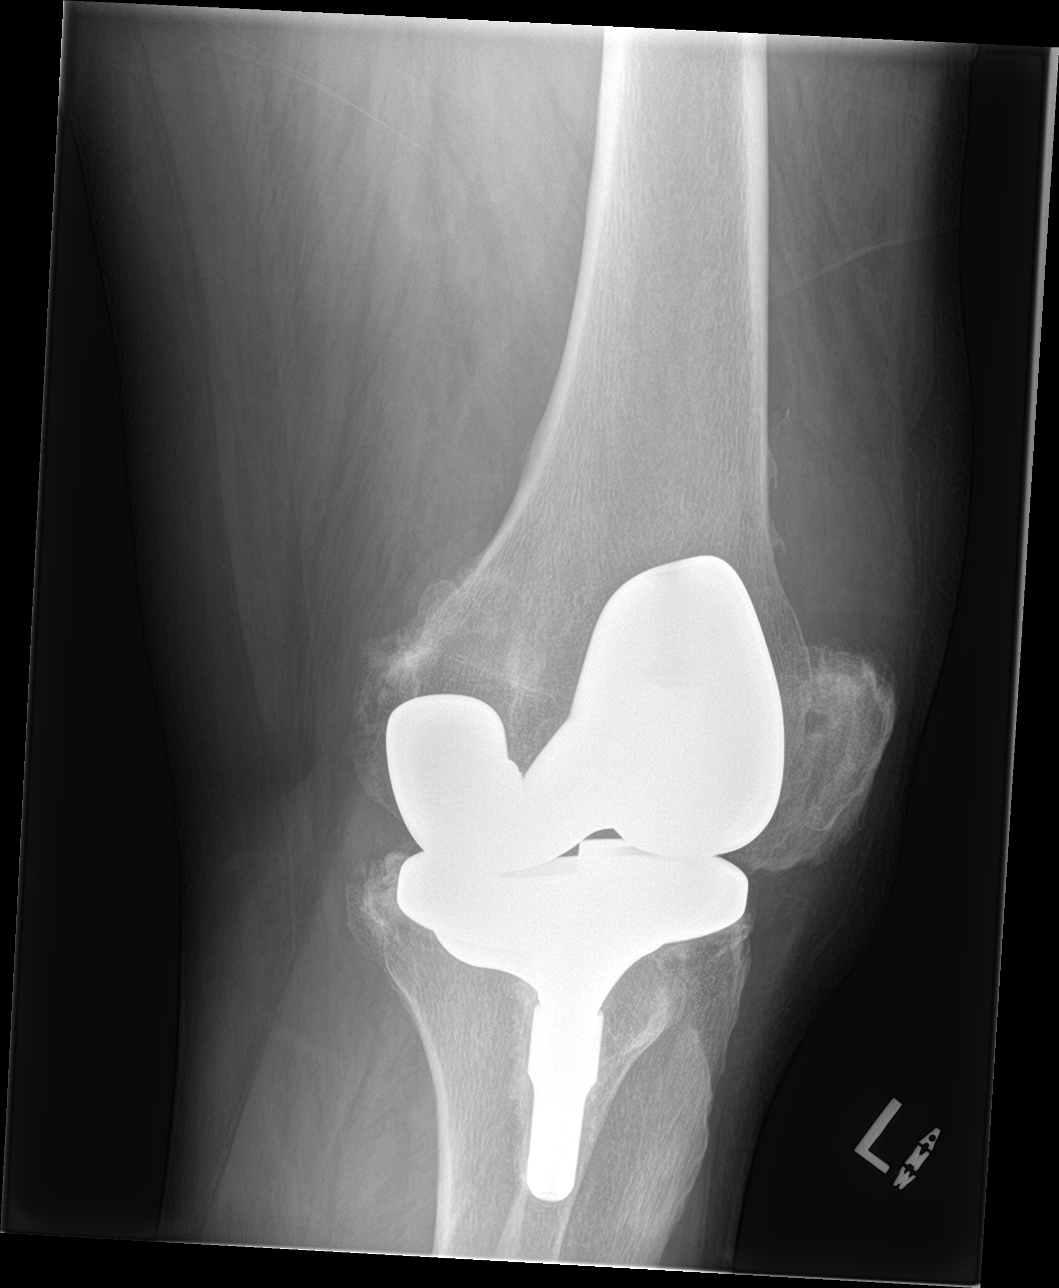
[im 4/4]
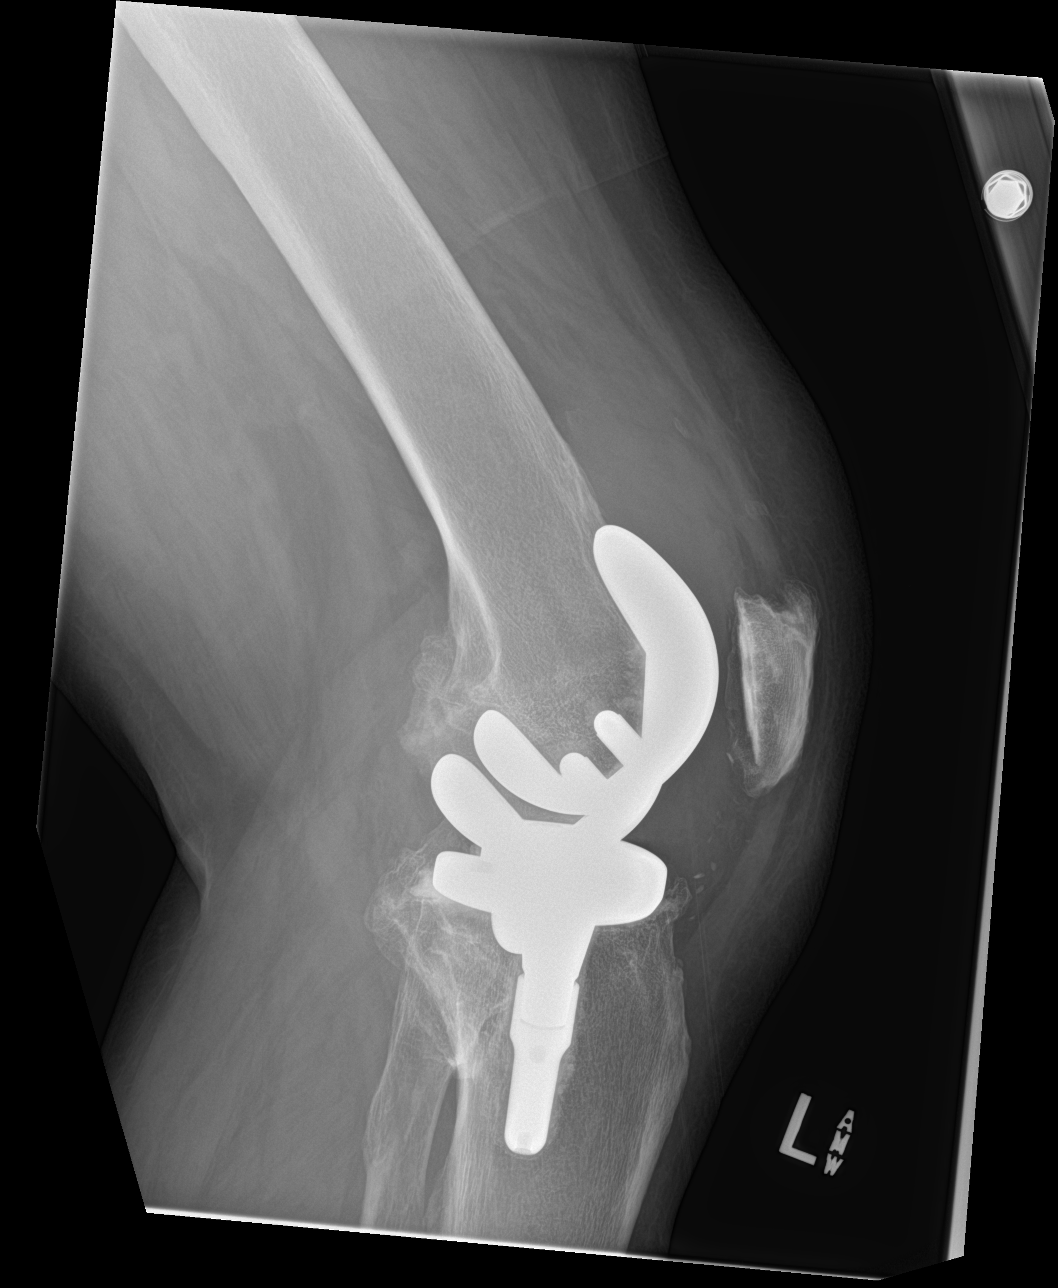

[4 of 4 positions shown; findings below may reference images not displayed]

FINDINGS: Frontal, lateral, and bilateral oblique views were obtained. There
is a total knee replacement with femoral and tibial prosthetic
components well-seated. No acute fracture or dislocation is evident.
There is a large joint effusion.

There is spurring along the anterior patella. There is proximal
tibiofibular syndesmosis with arthropathy in this area. There is an
old healed fracture of the proximal fibular diaphysis.
IMPRESSION: Total knee replacement with prosthetic components well-seated. Old
healed fracture proximal fibula with proximal tibia-fibular
syndesmosis. No acute fracture or dislocation evident.

Large joint effusion. Traumatic etiology for this joint effusion
must be questioned.

## 2020-01-31 DIAGNOSIS — M170 Bilateral primary osteoarthritis of knee: Secondary | ICD-10-CM | POA: Diagnosis not present

## 2020-02-03 DIAGNOSIS — S83002A Unspecified subluxation of left patella, initial encounter: Secondary | ICD-10-CM | POA: Diagnosis not present

## 2020-02-03 DIAGNOSIS — Z96652 Presence of left artificial knee joint: Secondary | ICD-10-CM | POA: Diagnosis not present

## 2020-02-07 DIAGNOSIS — Z96659 Presence of unspecified artificial knee joint: Secondary | ICD-10-CM | POA: Diagnosis not present

## 2020-02-07 DIAGNOSIS — Z96652 Presence of left artificial knee joint: Secondary | ICD-10-CM | POA: Diagnosis not present

## 2020-02-07 DIAGNOSIS — S83002A Unspecified subluxation of left patella, initial encounter: Secondary | ICD-10-CM | POA: Diagnosis not present

## 2020-02-07 DIAGNOSIS — T8459XD Infection and inflammatory reaction due to other internal joint prosthesis, subsequent encounter: Secondary | ICD-10-CM | POA: Diagnosis not present

## 2020-02-07 DIAGNOSIS — S83015A Lateral dislocation of left patella, initial encounter: Secondary | ICD-10-CM | POA: Diagnosis not present

## 2020-02-26 ENCOUNTER — Other Ambulatory Visit: Payer: Self-pay | Admitting: Orthopedic Surgery

## 2020-02-27 ENCOUNTER — Other Ambulatory Visit: Payer: Self-pay

## 2020-02-27 ENCOUNTER — Other Ambulatory Visit: Payer: Self-pay | Admitting: Orthopedic Surgery

## 2020-02-27 ENCOUNTER — Encounter
Admission: RE | Admit: 2020-02-27 | Discharge: 2020-02-27 | Disposition: A | Discharge status: Home / Self Care | Payer: 59 | Source: Ambulatory Visit | Attending: Orthopedic Surgery | Admitting: Orthopedic Surgery

## 2020-02-27 HISTORY — DX: Unilateral inguinal hernia, without obstruction or gangrene, not specified as recurrent: K40.90

## 2020-02-27 NOTE — Patient Instructions (Addendum)
Your procedure is scheduled on: Tuesday, November 9 Report to Day Surgery on the 2nd floor of the [facility]. To find out your arrival time, please call [phone] between 1PM - 3PM on: Monday, November 8  REMEMBER: Instructions that are not followed completely may result in serious medical risk, up to and including death; or upon the discretion of your surgeon and anesthesiologist your surgery may need to be rescheduled.  Do not eat food after midnight the night before surgery.  No gum chewing, lozengers or hard candies.  You may however, drink CLEAR liquids up to 2 hours before you are scheduled to arrive for your surgery. Do not drink anything within 2 hours of your scheduled arrival time.  Clear liquids include: - water  - apple juice without pulp - gatorade (not RED, PURPLE, OR BLUE) - black coffee or tea (Do NOT add milk or creamers to the coffee or tea) Do NOT drink anything that is not on this list.  In addition, your doctor has ordered for you to drink the provided  Ensure Pre-Surgery Clear Carbohydrate Drink  Drinking this carbohydrate drink up to two hours before surgery helps to reduce insulin resistance and improve patient outcomes. Please complete drinking 2 hours prior to scheduled arrival time.  TAKE THESE MEDICATIONS THE MORNING OF SURGERY WITH A SIP OF WATER:  1.  Buspirone (Buspar) 2.  Duloxetine (Cymbalta) 3.  Gemfibrozil (Lopid) 4.  Paroxetine (Paxil)  Stop Metformin 2 days prior to surgery. Last day to take is Saturday, November 6; resume after surgery.  One week prior to surgery: Stop Meloxicam and Anti-inflammatories (NSAIDS) such as Advil, Aleve, Ibuprofen, Motrin, Naproxen, Naprosyn and Aspirin based products such as Excedrin, Goodys Powder, BC Powder. Stop ANY OVER THE COUNTER supplements until after surgery. (folic acid) (You may continue taking Tylenol, Vitamin D, Vitamin B, and multivitamin.)  No Alcohol for 24 hours before or after  surgery.  No Smoking including e-cigarettes for 24 hours prior to surgery.  No chewable tobacco products for at least 6 hours prior to surgery.  No nicotine patches on the day of surgery.  On the morning of surgery brush your teeth with toothpaste and water, you may rinse your mouth with mouthwash if you wish. Do not swallow any toothpaste or mouthwash.  Do not wear jewelry.  Do not wear lotions, powders, or perfumes.   Do not shave 48 hours prior to surgery.   Do not bring valuables to the hospital. [facility] is not responsible for any missing/lost belongings or valuables.   Use CHG Soap as directed on instruction sheet.  Notify your doctor if there is any change in your medical condition (cold, fever, infection).  Wear comfortable clothing (specific to your surgery type) to the hospital.  Plan for stool softeners for home use; pain medications have a tendency to cause constipation. You can also help prevent constipation by eating foods high in fiber such as fruits and vegetables and drinking plenty of fluids as your diet allows.  After surgery, you can help prevent lung complications by doing breathing exercises.  Take deep breaths and cough every 1-2 hours. Your doctor may order a device called an Incentive Spirometer to help you take deep breaths.  If you are being discharged the day of surgery, you will not be allowed to drive home. You will need a responsible adult (18 years or older) to drive you home and stay with you that night.   If you are taking public transportation,  you will need to have a responsible adult (18 years or older) with you. Please confirm with your physician that it is acceptable to use public transportation.   Please call the [place] Dept. at [phone] if you have any questions about these instructions.  Visitation Policy:  Patients undergoing a surgery or procedure may have one family member or support person with them as long  as that person is not COVID-19 positive or experiencing its symptoms.  That person may remain in the waiting area during the procedure.

## 2020-03-02 ENCOUNTER — Encounter
Admission: RE | Admit: 2020-03-02 | Discharge: 2020-03-02 | Disposition: A | Discharge status: Home / Self Care | Payer: 59 | Source: Ambulatory Visit | Attending: Orthopedic Surgery | Admitting: Orthopedic Surgery

## 2020-03-02 ENCOUNTER — Other Ambulatory Visit: Payer: Self-pay

## 2020-03-02 DIAGNOSIS — I10 Essential (primary) hypertension: Secondary | ICD-10-CM | POA: Insufficient documentation

## 2020-03-02 DIAGNOSIS — Z01810 Encounter for preprocedural cardiovascular examination: Secondary | ICD-10-CM | POA: Diagnosis not present

## 2020-03-06 ENCOUNTER — Other Ambulatory Visit
Admission: RE | Admit: 2020-03-06 | Discharge: 2020-03-06 | Disposition: A | Discharge status: Home / Self Care | Payer: 59 | Source: Ambulatory Visit | Attending: Orthopedic Surgery | Admitting: Orthopedic Surgery

## 2020-03-06 ENCOUNTER — Other Ambulatory Visit: Payer: Self-pay

## 2020-03-06 DIAGNOSIS — Z20822 Contact with and (suspected) exposure to covid-19: Secondary | ICD-10-CM | POA: Diagnosis not present

## 2020-03-06 DIAGNOSIS — Z01812 Encounter for preprocedural laboratory examination: Secondary | ICD-10-CM | POA: Diagnosis present

## 2020-03-06 LAB — SARS CORONAVIRUS 2 (TAT 6-24 HRS): SARS Coronavirus 2: NEGATIVE

## 2020-03-09 MED ORDER — DEXTROSE 5 % IV SOLN
3.0000 g | INTRAVENOUS | Status: AC
  Administered 2020-03-10: 3 g via INTRAVENOUS
  Filled 2020-03-09: qty 3

## 2020-03-10 ENCOUNTER — Encounter: Payer: Self-pay | Admitting: Orthopedic Surgery

## 2020-03-10 ENCOUNTER — Ambulatory Visit: Payer: 59

## 2020-03-10 ENCOUNTER — Other Ambulatory Visit: Payer: Self-pay

## 2020-03-10 ENCOUNTER — Encounter
Admission: RE | Disposition: A | Discharge status: Home / Self Care | Payer: Self-pay | Source: Home / Self Care | Attending: Orthopedic Surgery

## 2020-03-10 ENCOUNTER — Ambulatory Visit
Admission: RE | Admit: 2020-03-10 | Discharge: 2020-03-10 | Disposition: A | Discharge status: Home / Self Care | Payer: 59 | Attending: Orthopedic Surgery | Admitting: Orthopedic Surgery

## 2020-03-10 DIAGNOSIS — S83015A Lateral dislocation of left patella, initial encounter: Secondary | ICD-10-CM | POA: Diagnosis not present

## 2020-03-10 DIAGNOSIS — Z7982 Long term (current) use of aspirin: Secondary | ICD-10-CM | POA: Insufficient documentation

## 2020-03-10 DIAGNOSIS — M2212 Recurrent subluxation of patella, left knee: Secondary | ICD-10-CM | POA: Insufficient documentation

## 2020-03-10 DIAGNOSIS — Z79899 Other long term (current) drug therapy: Secondary | ICD-10-CM | POA: Diagnosis not present

## 2020-03-10 DIAGNOSIS — Z7984 Long term (current) use of oral hypoglycemic drugs: Secondary | ICD-10-CM | POA: Diagnosis not present

## 2020-03-10 DIAGNOSIS — Z96652 Presence of left artificial knee joint: Secondary | ICD-10-CM | POA: Diagnosis not present

## 2020-03-10 DIAGNOSIS — S83002A Unspecified subluxation of left patella, initial encounter: Secondary | ICD-10-CM | POA: Diagnosis not present

## 2020-03-10 DIAGNOSIS — T8459XA Infection and inflammatory reaction due to other internal joint prosthesis, initial encounter: Secondary | ICD-10-CM | POA: Diagnosis not present

## 2020-03-10 HISTORY — PX: MEDIAL PATELLOFEMORAL LIGAMENT REPAIR: SHX2020

## 2020-03-10 SURGERY — RECONSTRUCTION, LIGAMENT, MEDIAL PATELLOFEMORAL
Anesthesia: General | Site: Knee | Laterality: Left

## 2020-03-10 MED ORDER — SEVOFLURANE IN SOLN
RESPIRATORY_TRACT | Status: AC
  Filled 2020-03-10: qty 250

## 2020-03-10 MED ORDER — FAMOTIDINE 20 MG PO TABS
ORAL_TABLET | ORAL | Status: AC
  Filled 2020-03-10: qty 1

## 2020-03-10 MED ORDER — DEXAMETHASONE SODIUM PHOSPHATE 10 MG/ML IJ SOLN
INTRAMUSCULAR | Status: AC
  Filled 2020-03-10: qty 1

## 2020-03-10 MED ORDER — ONDANSETRON HCL 4 MG/2ML IJ SOLN: 4.0000 mg | Freq: Once | INTRAMUSCULAR | Status: DC | PRN

## 2020-03-10 MED ORDER — FENTANYL CITRATE (PF) 100 MCG/2ML IJ SOLN
INTRAMUSCULAR | Status: AC
  Filled 2020-03-10: qty 2

## 2020-03-10 MED ORDER — MIDAZOLAM HCL 2 MG/2ML IJ SOLN
INTRAMUSCULAR | Status: DC | PRN
  Administered 2020-03-10: 2 mg via INTRAVENOUS

## 2020-03-10 MED ORDER — SODIUM CHLORIDE (PF) 0.9 % IJ SOLN
INTRAMUSCULAR | Status: AC
  Filled 2020-03-10: qty 10

## 2020-03-10 MED ORDER — OXYCODONE HCL ER 10 MG PO T12A
EXTENDED_RELEASE_TABLET | ORAL | Status: AC
  Administered 2020-03-10: 10 mg
  Filled 2020-03-10: qty 1

## 2020-03-10 MED ORDER — FENTANYL CITRATE (PF) 100 MCG/2ML IJ SOLN
INTRAMUSCULAR | Status: DC | PRN
  Administered 2020-03-10 (×2): 50 ug via INTRAVENOUS

## 2020-03-10 MED ORDER — DEXMEDETOMIDINE (PRECEDEX) IN NS 20 MCG/5ML (4 MCG/ML) IV SYRINGE
PREFILLED_SYRINGE | INTRAVENOUS | Status: DC | PRN
  Administered 2020-03-10: 4 ug via INTRAVENOUS

## 2020-03-10 MED ORDER — OXYCODONE HCL 5 MG PO TABS: 10.0000 mg | ORAL_TABLET | Freq: Once | ORAL | Status: DC | PRN

## 2020-03-10 MED ORDER — KETAMINE HCL 10 MG/ML IJ SOLN
INTRAMUSCULAR | Status: DC | PRN
  Administered 2020-03-10: 50 mg via INTRAVENOUS

## 2020-03-10 MED ORDER — PHENYLEPHRINE HCL (PRESSORS) 10 MG/ML IV SOLN
INTRAVENOUS | Status: DC | PRN
  Administered 2020-03-10 (×2): 50 ug via INTRAVENOUS
  Administered 2020-03-10: 100 ug via INTRAVENOUS
  Administered 2020-03-10: 50 ug via INTRAVENOUS
  Administered 2020-03-10 (×2): 100 ug via INTRAVENOUS
  Administered 2020-03-10: 150 ug via INTRAVENOUS

## 2020-03-10 MED ORDER — ONDANSETRON HCL 4 MG/2ML IJ SOLN
INTRAMUSCULAR | Status: AC
  Filled 2020-03-10: qty 2

## 2020-03-10 MED ORDER — LIDOCAINE HCL (PF) 2 % IJ SOLN
INTRAMUSCULAR | Status: AC
  Filled 2020-03-10: qty 5

## 2020-03-10 MED ORDER — FAMOTIDINE 20 MG PO TABS
20.0000 mg | ORAL_TABLET | Freq: Once | ORAL | Status: AC
  Administered 2020-03-10: 20 mg via ORAL

## 2020-03-10 MED ORDER — OXYCODONE HCL 5 MG/5ML PO SOLN: 10.0000 mg | Freq: Once | ORAL | Status: DC | PRN

## 2020-03-10 MED ORDER — SUCCINYLCHOLINE CHLORIDE 20 MG/ML IJ SOLN
INTRAMUSCULAR | Status: DC | PRN
  Administered 2020-03-10: 140 mg via INTRAVENOUS

## 2020-03-10 MED ORDER — HYDROCODONE-ACETAMINOPHEN 10-325 MG PO TABS: 1.0000 | ORAL_TABLET | ORAL | 0 refills | Status: DC | PRN

## 2020-03-10 MED ORDER — SODIUM CHLORIDE 0.9 % IV SOLN
INTRAVENOUS | Status: DC | PRN
  Administered 2020-03-10: 20 ug/min via INTRAVENOUS

## 2020-03-10 MED ORDER — ONDANSETRON HCL 4 MG/2ML IJ SOLN
INTRAMUSCULAR | Status: DC | PRN
  Administered 2020-03-10: 4 mg via INTRAVENOUS

## 2020-03-10 MED ORDER — LIDOCAINE HCL (CARDIAC) PF 100 MG/5ML IV SOSY
PREFILLED_SYRINGE | INTRAVENOUS | Status: DC | PRN
  Administered 2020-03-10: 100 mg via INTRAVENOUS

## 2020-03-10 MED ORDER — CHLORHEXIDINE GLUCONATE 0.12 % MT SOLN: 15.0000 mL | Freq: Once | OROMUCOSAL | Status: AC

## 2020-03-10 MED ORDER — CHLORHEXIDINE GLUCONATE 0.12 % MT SOLN
OROMUCOSAL | Status: AC
  Administered 2020-03-10: 15 mL via OROMUCOSAL
  Filled 2020-03-10: qty 15

## 2020-03-10 MED ORDER — OXYCODONE HCL 5 MG PO TABS: 5.0000 mg | ORAL_TABLET | Freq: Once | ORAL | Status: DC | PRN

## 2020-03-10 MED ORDER — SUGAMMADEX SODIUM 500 MG/5ML IV SOLN
INTRAVENOUS | Status: AC
  Filled 2020-03-10: qty 5

## 2020-03-10 MED ORDER — DEXAMETHASONE SODIUM PHOSPHATE 10 MG/ML IJ SOLN
INTRAMUSCULAR | Status: DC | PRN
  Administered 2020-03-10: 8 mg via INTRAVENOUS

## 2020-03-10 MED ORDER — PROPOFOL 500 MG/50ML IV EMUL
INTRAVENOUS | Status: AC
  Filled 2020-03-10: qty 50

## 2020-03-10 MED ORDER — BUPIVACAINE HCL (PF) 0.5 % IJ SOLN
INTRAMUSCULAR | Status: AC
  Filled 2020-03-10: qty 120

## 2020-03-10 MED ORDER — NEOMYCIN-POLYMYXIN B GU 40-200000 IR SOLN
Status: DC | PRN
  Administered 2020-03-10: 2 mL

## 2020-03-10 MED ORDER — DEXMEDETOMIDINE (PRECEDEX) IN NS 20 MCG/5ML (4 MCG/ML) IV SYRINGE
PREFILLED_SYRINGE | INTRAVENOUS | Status: AC
  Filled 2020-03-10: qty 5

## 2020-03-10 MED ORDER — FENTANYL CITRATE (PF) 100 MCG/2ML IJ SOLN
25.0000 ug | INTRAMUSCULAR | Status: DC | PRN
  Administered 2020-03-10 (×3): 50 ug via INTRAVENOUS

## 2020-03-10 MED ORDER — PROPOFOL 10 MG/ML IV BOLUS
INTRAVENOUS | Status: AC
  Filled 2020-03-10: qty 20

## 2020-03-10 MED ORDER — PROPOFOL 10 MG/ML IV BOLUS
INTRAVENOUS | Status: DC | PRN
  Administered 2020-03-10: 220 mg via INTRAVENOUS

## 2020-03-10 MED ORDER — SUCCINYLCHOLINE CHLORIDE 200 MG/10ML IV SOSY
PREFILLED_SYRINGE | INTRAVENOUS | Status: AC
  Filled 2020-03-10: qty 20

## 2020-03-10 MED ORDER — LACTATED RINGERS IV SOLN: INTRAVENOUS | Status: DC

## 2020-03-10 MED ORDER — OXYCODONE HCL 5 MG/5ML PO SOLN: 5.0000 mg | Freq: Once | ORAL | Status: DC | PRN

## 2020-03-10 MED ORDER — MIDAZOLAM HCL 2 MG/2ML IJ SOLN
INTRAMUSCULAR | Status: AC
  Filled 2020-03-10: qty 2

## 2020-03-10 MED ORDER — ROCURONIUM BROMIDE 100 MG/10ML IV SOLN
INTRAVENOUS | Status: DC | PRN
  Administered 2020-03-10: 30 mg via INTRAVENOUS

## 2020-03-10 MED ORDER — PHENYLEPHRINE HCL (PRESSORS) 10 MG/ML IV SOLN
INTRAVENOUS | Status: AC
  Filled 2020-03-10: qty 1

## 2020-03-10 MED ORDER — ROCURONIUM BROMIDE 10 MG/ML (PF) SYRINGE
PREFILLED_SYRINGE | INTRAVENOUS | Status: AC
  Filled 2020-03-10: qty 10

## 2020-03-10 MED ORDER — KETAMINE HCL 50 MG/ML IJ SOLN
INTRAMUSCULAR | Status: AC
  Filled 2020-03-10: qty 10

## 2020-03-10 MED ORDER — ACETAMINOPHEN 10 MG/ML IV SOLN: 1000.0000 mg | Freq: Once | INTRAVENOUS | Status: DC | PRN

## 2020-03-10 MED ORDER — ORAL CARE MOUTH RINSE: 15.0000 mL | Freq: Once | OROMUCOSAL | Status: AC

## 2020-03-10 SURGICAL SUPPLY — 38 items
APL PRP STRL LF DISP 70% ISPRP (MISCELLANEOUS) ×1
BNDG CMPR STD VLCR NS LF 5.8X6 (GAUZE/BANDAGES/DRESSINGS) ×1
BNDG ELASTIC 6X5.8 VLCR NS LF (GAUZE/BANDAGES/DRESSINGS) ×3 IMPLANT
CANISTER SUCT 1200ML W/VALVE (MISCELLANEOUS) ×3 IMPLANT
CHLORAPREP W/TINT 26 (MISCELLANEOUS) ×3 IMPLANT
COVER WAND RF STERILE (DRAPES) ×3 IMPLANT
CUFF TOURN SGL QUICK 30 (TOURNIQUET CUFF) ×3
CUFF TRNQT CYL 30X4X21-28X (TOURNIQUET CUFF) ×1 IMPLANT
ELECT CAUTERY BLADE 6.4 (BLADE) ×3 IMPLANT
ELECT REM PT RETURN 9FT ADLT (ELECTROSURGICAL) ×3
ELECTRODE REM PT RTRN 9FT ADLT (ELECTROSURGICAL) ×1 IMPLANT
GAUZE SPONGE 4X4 12PLY STRL (GAUZE/BANDAGES/DRESSINGS) ×6 IMPLANT
GAUZE XEROFORM 1X8 LF (GAUZE/BANDAGES/DRESSINGS) ×3 IMPLANT
GLOVE SURG SYN 9.0  PF PI (GLOVE) ×2
GLOVE SURG SYN 9.0 PF PI (GLOVE) ×1 IMPLANT
GOWN SRG 2XL LVL 4 RGLN SLV (GOWNS) ×1 IMPLANT
GOWN STRL NON-REIN 2XL LVL4 (GOWNS) ×3
GOWN STRL REUS W/ TWL LRG LVL3 (GOWN DISPOSABLE) ×1 IMPLANT
GOWN STRL REUS W/TWL LRG LVL3 (GOWN DISPOSABLE) ×3
IMMOB KNEE 24 THIGH 24 443303 (SOFTGOODS) ×3 IMPLANT
KIT TURNOVER KIT A (KITS) ×3 IMPLANT
MANIFOLD NEPTUNE II (INSTRUMENTS) ×3 IMPLANT
NS IRRIG 500ML POUR BTL (IV SOLUTION) ×3 IMPLANT
PACK TOTAL KNEE (MISCELLANEOUS) ×3 IMPLANT
PAD ABD DERMACEA PRESS 5X9 (GAUZE/BANDAGES/DRESSINGS) ×3 IMPLANT
SCALPEL PROTECTED #10 DISP (BLADE) ×6 IMPLANT
STAPLER SKIN PROX 35W (STAPLE) ×3 IMPLANT
SUT DVC VLOC 3-0 CL 6 P-12 (SUTURE) IMPLANT
SUT V-LOC 90 ABS DVC 3-0 CL (SUTURE) ×3 IMPLANT
SUT VIC AB 0 CT1 27 (SUTURE) ×3
SUT VIC AB 0 CT1 27XCR 8 STRN (SUTURE) ×1 IMPLANT
SUT VIC AB 0 CT1 36 (SUTURE) ×3 IMPLANT
SUT VIC AB 2-0 CT1 27 (SUTURE) ×3
SUT VIC AB 2-0 CT1 TAPERPNT 27 (SUTURE) ×1 IMPLANT
SYR 10ML LL (SYRINGE) ×3 IMPLANT
SYR BULB IRRIG 60ML STRL (SYRINGE) ×3 IMPLANT
SYS INTERNAL BRACE KNEE (Miscellaneous) ×3 IMPLANT
SYSTEM INTERNAL BRACE KNEE (Miscellaneous) ×1 IMPLANT

## 2020-03-10 NOTE — Progress Notes (Signed)
   03/10/20 0752  Clinical Encounter Type  Visited With Family  Visit Type Initial  Referral From Chaplain  Consult/Referral To Chaplain  While rounding [facility] waiting area, chaplain briefly visited with Pt's wife, Nathan Chambers to see how she was doing while she waited for her husband. Pt's wife said she didn't have any questions or concerns. Chaplain wished her well and left.

## 2020-03-10 NOTE — Discharge Instructions (Addendum)
Take it easy the next 3 days until you are rechecked. Pain medicine as directed Weightbearing as tolerated on right leg but again try to minimize activity Use a walker. Call office if having problems.   AMBULATORY SURGERY  DISCHARGE INSTRUCTIONS   1) The drugs that you were given will stay in your system until tomorrow so for the next 24 hours you should not:  A) Drive an automobile B) Make any legal decisions C) Drink any alcoholic beverage   2) You may resume regular meals tomorrow.  Today it is better to start with liquids and gradually work up to solid foods.  You may eat anything you prefer, but it is better to start with liquids, then soup and crackers, and gradually work up to solid foods.   3) Please notify your doctor immediately if you have any unusual bleeding, trouble breathing, redness and pain at the surgery site, drainage, fever, or pain not relieved by medication.    4) Additional Instructions:        Please contact your physician with any problems or Same Day Surgery at [phone], Monday through Friday 6 am to 4 pm, or [place] [facility] number at [phone].

## 2020-03-10 NOTE — Anesthesia Postprocedure Evaluation (Signed)
Anesthesia Post Note  Patient: Nathan Chambers  Procedure(s) Performed: MEDIAL PATELLA FEMORAL LIGAMENT RECONSTRUCTION (Left Knee)  Patient location during evaluation: PACU Anesthesia Type: General Level of consciousness: awake and alert Pain management: pain level controlled Vital Signs Assessment: post-procedure vital signs reviewed and stable Respiratory status: spontaneous breathing, nonlabored ventilation, respiratory function stable and patient connected to nasal cannula oxygen Cardiovascular status: blood pressure returned to baseline and stable Postop Assessment: no apparent nausea or vomiting Anesthetic complications: no   No complications documented.   Last Vitals:  Vitals:   03/10/20 0934 03/10/20 1014  BP: 108/64 126/78  Pulse: 65 68  Resp: 18 18  Temp: (!) 36.4 C   SpO2: 94% 97%    Last Pain:  Vitals:   03/10/20 1014  TempSrc:   PainSc: 5                  Arita Miss

## 2020-03-10 NOTE — Anesthesia Preprocedure Evaluation (Signed)
Anesthesia Evaluation  Patient identified by MRN, date of birth, ID band Patient awake  General Assessment Comment:Had prolonged bladder atony after spinal anesthesia  Reviewed: Allergy & Precautions, H&P , NPO status , Patient's Chart, lab work & pertinent test results, reviewed documented beta blocker date and time   History of Anesthesia Complications (+) history of anesthetic complications  Airway Mallampati: II  TM Distance: <3 FB Neck ROM: full  Mouth opening: Limited Mouth Opening  Dental  (+) Poor Dentition   Pulmonary sleep apnea , neg COPD, Patient abstained from smoking.Not current smoker, former smoker,  Likely undiagnosed OSA per patient; has been told he stops breathing at night. Encouraged patient to be tested for it   Pulmonary exam normal breath sounds clear to auscultation       Cardiovascular Exercise Tolerance: Good METShypertension, Pt. on medications (-) CAD and (-) Past MI Normal cardiovascular exam(-) dysrhythmias  Rhythm:regular Rate:Normal - Systolic murmurs    Neuro/Psych  Headaches, PSYCHIATRIC DISORDERS Anxiety Bipolar Disorder  Neuromuscular disease    GI/Hepatic Neg liver ROS, GERD  ,  Endo/Other  neg diabetesMorbid obesity  Renal/GU Renal disease  negative genitourinary   Musculoskeletal  (+) Arthritis ,   Abdominal (+) + obese,   Peds  Hematology negative hematology ROS (+)   Anesthesia Other Findings Past Medical History: No date: Anxiety No date: Arthritis     Comment:  knees, hands No date: Bipolar disorder (HCC) No date: Chronic kidney disease     Comment:  arf 2014 due to dehydration No date: Complication of anesthesia     Comment:  woke up during last knee surgery feb 2020 No date: GERD (gastroesophageal reflux disease)     Comment:  rare no meds No date: Headache No date: Hemorrhoids No date: Hyperlipidemia No date: Hypertension No date: LFT elevation No date: NAFLD  (nonalcoholic fatty liver disease) Past Surgical History: 10/30/2015: ACHILLES TENDON SURGERY; Right     Comment:  Procedure: ACHILLES TENDON REPAIR/ HEGLAND OSTECTOMY;                Surgeon: [name], DPM;  Location: [facility];                Service: Podiatry;  Laterality: Right; No date: COLONOSCOPY 04/07/2017: COLONOSCOPY WITH PROPOFOL; N/A     Comment:  Procedure: COLONOSCOPY WITH PROPOFOL;  Surgeon: [place],               Benay Pike, MD;  Location: [facility];  Service:               Gastroenterology;  Laterality: N/A; No date: FRACTURE SURGERY; Left     Comment:  MIDDLE FINGER No date: HERNIA REPAIR 12/08/2017: I&D KNEE WITH POLY EXCHANGE; Left     Comment:  Procedure: IRRIGATION AND DEBRIDEMENT KNEE WITH POLY               EXCHANGE;  Surgeon: Dereck Leep, MD;  Location: [facility]               ORS;  Service: Orthopedics;  Laterality: Left; 2017: JOINT REPLACEMENT; Bilateral     Comment:  bilateral knees 12/08/2017: KNEE ARTHROTOMY; Left     Comment:  Procedure: KNEE ARTHROTOMY, I&D, [place] OUT, POLY               EXCHANGE;  Surgeon: Dereck Leep, MD;  Location: [facility]               ORS;  Service: Orthopedics;  Laterality: Left; No date: LIPOMA EXCISION 04/19/2016: TOTAL KNEE ARTHROPLASTY; Bilateral     Comment:  Procedure: TOTAL KNEE BILATERAL;  Surgeon: Hessie Knows,              MD;  Location: [facility];  Service: Orthopedics;                Laterality: Bilateral; 09/11/2018: TOTAL KNEE ARTHROPLASTY; Left     Comment:  Procedure: TOTAL KNEE ARTHROPLASTY - LEFT - HARDWARE               REMOVAL WITH INSERTION OF ANTIBIOTIC SPACER;  Surgeon:               Hessie Knows, MD;  Location: [facility];  Service:               Orthopedics;  Laterality: Left; 06/11/2018: TOTAL KNEE REVISION; Left     Comment:  Procedure: INCISION AND DRAINAGE OF LEFT KNEE, POLY               EXCHANGE;  Surgeon: Hessie Knows, MD;  Location: [facility]               ORS;  Service: Orthopedics;  Laterality:  Left; 11/27/2018: TOTAL KNEE REVISION; Left     Comment:  Procedure: TOTAL KNEE REVISION REIMPLANTATION OF LEFT               TOTAL KNEE;  Surgeon: Hessie Knows, MD;  Location: [facility]               ORS;  Service: Orthopedics;  Laterality: Left; No date: VASECTOMY BMI    Body Mass Index: 41.88 kg/m     Reproductive/Obstetrics                             Anesthesia Physical  Anesthesia Plan  ASA: III  Anesthesia Plan: General ETT   Post-op Pain Management:    Induction: Intravenous  PONV Risk Score and Plan: 2 and Ondansetron, Dexamethasone, Midazolam and Treatment may vary due to age or medical condition  Airway Management Planned: Oral ETT  Additional Equipment: None  Intra-op Plan:   Post-operative Plan: Extubation in OR  Informed Consent: I have reviewed the patients History and Physical, chart, labs and discussed the procedure including the risks, benefits and alternatives for the proposed anesthesia with the patient or authorized representative who has indicated his/her understanding and acceptance.     Dental Advisory Given  Plan Discussed with: CRNA  Anesthesia Plan Comments: (Discussed risks of anesthesia with patient, including PONV, sore throat, lip/dental damage. Rare risks discussed as well, such as cardiorespiratory and neurological sequelae. Patient understands. Patient informed about increased incidence of above perioperative risk due to high BMI. Patient understands. )        Anesthesia Quick Evaluation

## 2020-03-10 NOTE — Anesthesia Procedure Notes (Signed)
Procedure Name: Intubation Performed by: Rolla Plate, CRNA Pre-anesthesia Checklist: Patient identified, Patient being monitored, Timeout performed, Emergency Drugs available and Suction available Patient Re-evaluated:Patient Re-evaluated prior to induction Oxygen Delivery Method: Circle system utilized Preoxygenation: Pre-oxygenation with 100% oxygen Induction Type: IV induction Ventilation: Mask ventilation without difficulty Laryngoscope Size: Mac and 4 Grade View: Grade II Tube type: Oral Tube size: 7.5 mm Number of attempts: 1 Airway Equipment and Method: Stylet and Patient positioned with wedge pillow Placement Confirmation: ETT inserted through vocal cords under direct vision,  positive ETCO2 and breath sounds checked- equal and bilateral Secured at: 22 cm Tube secured with: Tape Dental Injury: Teeth and Oropharynx as per pre-operative assessment

## 2020-03-10 NOTE — Transfer of Care (Signed)
Immediate Anesthesia Transfer of Care Note  Patient: DEMONTREZ RINDFLEISCH  Procedure(s) Performed: MEDIAL PATELLA FEMORAL LIGAMENT RECONSTRUCTION (Left Knee)  Patient Location: PACU  Anesthesia Type:General  Level of Consciousness: awake and alert   Airway & Oxygen Therapy: Patient Spontanous Breathing and Patient connected to face mask oxygen  Post-op Assessment: Report given to RN and Post -op Vital signs reviewed and stable  Post vital signs: Reviewed  Last Vitals:  Vitals Value Taken Time  BP    Temp    Pulse 81 03/10/20 0831  Resp 21 03/10/20 0831  SpO2 95 % 03/10/20 0831  Vitals shown include unvalidated device data.  Last Pain:  Vitals:   03/10/20 0626  TempSrc: Temporal  PainSc: 3          Complications: No complications documented.

## 2020-03-10 NOTE — H&P (Signed)
History of the Present Illness: Nathan Chambers is a 54 y.o. male here today for history and physical for left total knee patella realignment, date of surgery 03/10/2020 with Dr. Hessie Knows. Patient is status post prior left total knee arthroplasty with recurrent patellar subluxation. He had an infection in the past as well. The patient underwent prior total knee arthroplasty and subsequently had an infection and debridements with nothing growing. He had x-rays on 02/03/2020, which showed partial subluxation, but clinically he is subluxing completely. The patient states his left knee has been sliding out of place for a long time. He reports he is tired of having his left leg operated on. The patients notes subluxation with full extension of his knee. He states he would like to proceed with surgical treatment of his left knee. The patient experienced a constipation issue when he previously stopped taking his antibiotics.   The patient states his last blood work was obtained by Tsosie Billing, MD, on 08/13/2019, which the sedimentation rate was 10.   I have reviewed past medical, surgical, social and family history, and allergies as documented in the EMR.  Past Medical History: Past Medical History:  Diagnosis Date   Acute renal failure (CMS-HCC) 09/2012  Due to Dehydration   Elbow fracture, right   Fatty liver disease, nonalcoholic  ultrasound 35/3299   Hx of hemorrhoids   Hx of migraines   Hyperlipidemia   Hypertension   Neuropathy   Past Surgical History: Past Surgical History:  Procedure Laterality Date   ARTHROPLASTY TOTAL KNEE Left 01/10/2019  Medial Retinacular Repair.   Bilateral total knee arthroplasty 04/19/2016  Dr Rudene Christians   COLONOSCOPY 10/11/2013  Adenomatous Polyps - repeat 3 years per Dr. Rayann Heman   COLONOSCOPY 04/07/2017  Normal colon/PHx CP/Repeat 9yrs/TKT   HERNIA REPAIR Bilateral   Left knee arthrotomy, irrigation and debridement, polyethylene  exchange, and placement of antibiotic-impregnated biocomposite beads 12/08/2017  Dr Marry Guan   Left middle finger surgical repair   Lipoma removed from scalp   Right Achilles tendon repair; right calcaneal exostectomy 10/30/2015  Dr [name]   VASECTOMY   Past Family History: Family History  Problem Relation Age of Onset   High blood pressure (Hypertension) Mother   Osteoporosis (Thinning of bones) Mother   Diabetes Father   Medications: Current Outpatient Medications Ordered in Epic  Medication Sig Dispense Refill   alpha lipoic acid 600 mg Cap capsule Take 1 capsule (600 mg total) by mouth once daily   amLODIPine-benazepril (LOTREL) 10-40 mg capsule TAKE 1 CAPSULE BY MOUTH ONCE DAILY 90 capsule 3   ascorbic Acid (VITAMIN C) 500 mg CpER SR capsule Take 500 mg by mouth once daily.   aspirin 81 MG EC tablet Take 81 mg by mouth once daily.   buprenorphine (BUTRANS) 15 mcg/hour Place onto the skin   buPROPion (WELLBUTRIN XL) 300 MG XL tablet Take 1 tablet (300 mg total) by mouth once daily 30 tablet 1   busPIRone (BUSPAR) 5 MG tablet TAKE 1 TO 2 TABLETS BY MOUTH TWICE DAILY 360 tablet 1   chlorthalidone 25 MG tablet TAKE ONE-HALF TABLET BY MOUTH ONCE DAILY 45 tablet 3   cyanocobalamin (VITAMIN B12) 1000 MCG tablet Take 1,000 mcg by mouth once daily.   diazePAM (VALIUM) 5 MG tablet TAKE 1/2 TO 1 TABLET BY MOUTH EVERY DAY TO TWICE DAILY 60 tablet 0   DULoxetine (CYMBALTA) 60 MG DR capsule Take 1 capsule (60 mg total) by mouth once daily for 30 days 90 capsule  1   folic acid (FOLVITE) 1 MG tablet Take 1 mg by mouth once daily.   gemfibroziL (LOPID) 600 mg tablet TAKE 1 TABLET BY MOUTH TWICE DAILY BEFORE MEALS 180 tablet 3   meloxicam (MOBIC) 15 MG tablet TAKE 1 TABLET BY MOUTH ONCE DAILY 90 tablet 3   metFORMIN (GLUCOPHAGE-XR) 750 MG XR tablet Take 3 tablets PO daily. 270 tablet 1   multivitamin tablet Take 1 tablet by mouth once daily.   PARoxetine (PAXIL) 20  MG tablet TAKE 2 - 3 TABLETS BY MOUTH DAILY AS DIRECTED 270 tablet 3   pravastatin (PRAVACHOL) 20 MG tablet TAKE 1 TABLET BY MOUTH ONCE DAILY 90 tablet 3   No current Epic-ordered facility-administered medications on file.   Allergies: Allergies  Allergen Reactions   Etodolac Itching  [place].    There is no height or weight on file to calculate BMI.  Review of Systems: A comprehensive 14 point ROS was performed, reviewed, and the pertinent orthopaedic findings are documented in the HPI.  There were no vitals filed for this visit.   General Physical Examination:  General:  Well developed, well nourished, no apparent distress, normal affect, normal gait with no antalgic component.   HEENT: Head normocephalic, atraumatic, PERRL.   Abdomen: Soft, non tender, non distended, Bowel sounds present.  Heart: Examination of the heart reveals regular, rate, and rhythm. There is no murmur noted on ascultation. There is a normal apical pulse.  Lungs: Lungs are clear to auscultation. There is no wheeze, rhonchi, or crackles. There is normal expansion of bilateral chest walls.   Musculoskeletal Examination: On exam, left knee range of motion is 0 to 120 degrees with patellar subluxation. Patella subluxes laterally with quad tension in extension but improves with flexion  Radiographs: AP lateral sunrise views of the left knee are reviewed by me in the office appeared impression: Patient has underwent left total knee arthroplasty. Tibial and femoral components are intact with no evidence of loosening or subsidence. Patella tracks slightly lateral on the AP view. There is lateral tracking of the patella in the trochlear groove with subluxation. Patella is not completely dislocated, apex of the patella appears to be riding on the medial aspect of the lateral trochlea.   Assessment: ICD-10-CM  1. Patellar subluxation, left, subsequent encounter S83.002D  2. History of total knee  arthroplasty, left T01.601   Plan: 1. The patient has clinical findings of recurrent left knee patellar subluxation. Patient understands he needs a left patellar realignment surgery, lateral release and medial reefing with heavy suture and bone anchors. Risks, benefits, complications of a left total knee patellar realignment have been discussed with the patient. Patient has agreed and consented to procedure with Dr. Hessie Knows on 03/10/2020.    Electronically signed by Feliberto Gottron, [place] at 03/05/2020 9:36 AM EDT   Reviewed H+P No changes noted.

## 2020-03-10 NOTE — Op Note (Signed)
03/10/2020  8:29 AM  PATIENT:  Nathan Chambers  54 y.o. male  PRE-OPERATIVE DIAGNOSIS:  S/P revision of total knee, left [phone] Patellar subluxation, left, initial encounter S83.002A Lateral dislocation of left patella, initial encounter S83.015A Infected prosthetic knee joint, subsequent encounter T84.59XD, Z96.659  POST-OPERATIVE DIAGNOSIS:  S/P revision of total knee, left Z96.652  PROCEDURE:  Procedure(s): MEDIAL PATELLA FEMORAL LIGAMENT RECONSTRUCTION (Left)  SURGEON: Laurene Footman, MD  ASSISTANTS: Rachelle Hora, PA-C  ANESTHESIA:   general  EBL:  Total I/O In: 550 [I.V.:500; IV Piggyback:50] Out: 10 [Blood:10]  BLOOD ADMINISTERED:none  DRAINS: none   LOCAL MEDICATIONS USED:  NONE  SPECIMEN:  No Specimen  DISPOSITION OF SPECIMEN:  N/A  COUNTS:  YES  TOURNIQUET:   Total Tourniquet Time Documented: Thigh (Left) - 39 minutes Total: Thigh (Left) - 39 minutes   IMPLANTS: Arthrex suture anchor x2  DICTATION: .Dragon Dictation patient was brought to the operating room and after adequate general anesthesia was obtained the left leg was prepped and draped in the usual sterile fashion with a tourniquet by the upper thigh.  After patient identification and timeout procedures were completed tourniquet was raised and prior incision was utilized in the anterior knee with the widened scar elliptically excised.  The flaps were elevated medially and laterally to provide exposure of the entire patella as well as patellar tendon.  The patella was subluxed laterally at the start of the case in extension and in flexion.  The patellar tendon was exposed and the lateral third was incised longitudinally and and elevated off the tibia from its tibial attachment.  A medial incision was then made just medial to the patellar tendon and the tendon could be brought through this gap.  The Arthrex internal brace system was utilized with a fiber tape being woven around the patellar tendon and  patella with a medial ankle anchor to try to help maintain medial alignment of the patella after a an outside in lateral release was carried out leaving the synovium intact.  The for suture anchor was placed on the anterior medial aspect of the tibia with a wire being placed drilling tapping and then placing the suture anchor with a fiber tape attached this helped hold the patella slightly more medially.  The patellar tendon was then taken with a FiberWire passed through this using locking sutures with 2 ends at the distal end of the lateral third this was brought medially and a second anchor was placed bringing holding this part of the patellar tendon down again creating a medial locked medial based force to help hold the patella in alignment.  After these 2 suture anchors have been placed with associated suture and tissue on range of motion the patella appeared stable without subluxation.  The wound was then irrigated with #1 Vicryl used to repair the defect from the lateral patellar tendon 3 oh V-Loc was used to close the subcutaneous tissue followed by skin staples and a bulky dressing of Xeroform 4 x 4's ABD web roll and Ace wrap.  PLAN OF CARE: Discharge to home after PACU  PATIENT DISPOSITION:  PACU - hemodynamically stable.

## 2020-03-10 NOTE — OR Nursing (Addendum)
Instructed on use of incentive spirometer with return demonstration.  Dr Rudene Christians in to see patient assessed dog scratches on surgical leg, no new orders.

## 2020-04-08 ENCOUNTER — Telehealth: Payer: Self-pay | Admitting: Student in an Organized Health Care Education/Training Program

## 2020-04-08 NOTE — Telephone Encounter (Signed)
Insurance denied payment for [employer].

## 2020-04-08 NOTE — Telephone Encounter (Signed)
Calling about pain patch, still not able to pick up. Wants to know if something different can be prescribed so his insurance will cover it.

## 2020-04-22 DIAGNOSIS — S83002D Unspecified subluxation of left patella, subsequent encounter: Secondary | ICD-10-CM | POA: Diagnosis not present

## 2020-05-05 ENCOUNTER — Telehealth: Payer: Self-pay | Admitting: Student in an Organized Health Care Education/Training Program

## 2020-05-05 NOTE — Telephone Encounter (Signed)
Would like to speak with a nurse or Dr. Cherylann Ratel. Having a problem. Seems irate. Please call asap

## 2020-05-05 NOTE — Telephone Encounter (Signed)
Patient called and states will talk to Dr. Cherylann Ratel about his pain medication. I offered him an appointment earlier than 01/11 and he refused. He states he is just frustrated with [facility]/[place]/ not having meds for 6 weeks etc. and just wants to speak with Dr. Cherylann Ratel about it on 01/11. Pateint to call if he wants earlier appointment and we will try to work in if possible.

## 2020-05-06 DIAGNOSIS — R7309 Other abnormal glucose: Secondary | ICD-10-CM | POA: Diagnosis not present

## 2020-05-06 DIAGNOSIS — E785 Hyperlipidemia, unspecified: Secondary | ICD-10-CM | POA: Diagnosis not present

## 2020-05-11 ENCOUNTER — Ambulatory Visit: Payer: 59 | Admitting: Dermatology

## 2020-05-12 ENCOUNTER — Other Ambulatory Visit: Payer: Self-pay

## 2020-05-12 ENCOUNTER — Encounter: Payer: Self-pay | Admitting: Student in an Organized Health Care Education/Training Program

## 2020-05-12 ENCOUNTER — Ambulatory Visit
Payer: Medicare Other | Attending: Student in an Organized Health Care Education/Training Program | Admitting: Student in an Organized Health Care Education/Training Program

## 2020-05-12 VITALS — BP 145/86 | HR 101 | Temp 96.8°F | Resp 20 | Ht 75.0 in | Wt 315.0 lb

## 2020-05-12 DIAGNOSIS — T8454XD Infection and inflammatory reaction due to internal left knee prosthesis, subsequent encounter: Secondary | ICD-10-CM

## 2020-05-12 DIAGNOSIS — G8929 Other chronic pain: Secondary | ICD-10-CM

## 2020-05-12 DIAGNOSIS — Z96653 Presence of artificial knee joint, bilateral: Secondary | ICD-10-CM | POA: Diagnosis not present

## 2020-05-12 DIAGNOSIS — M5416 Radiculopathy, lumbar region: Secondary | ICD-10-CM | POA: Diagnosis not present

## 2020-05-12 DIAGNOSIS — G608 Other hereditary and idiopathic neuropathies: Secondary | ICD-10-CM

## 2020-05-12 DIAGNOSIS — M47816 Spondylosis without myelopathy or radiculopathy, lumbar region: Secondary | ICD-10-CM | POA: Diagnosis not present

## 2020-05-12 DIAGNOSIS — G894 Chronic pain syndrome: Secondary | ICD-10-CM

## 2020-05-12 NOTE — Progress Notes (Signed)
Safety precautions to be maintained throughout the outpatient stay will include: orient to surroundings, keep bed in low position, maintain call bell within reach at all times, provide assistance with transfer out of bed and ambulation.   Has not been able to get [employer] patch due to insurance issues.Marland Kitchen

## 2020-05-12 NOTE — Progress Notes (Signed)
PROVIDER NOTE: Information contained herein reflects review and annotations entered in association with encounter. Interpretation of such information and data should be left to medically-trained personnel. Information provided to patient can be located elsewhere in the medical record under "Patient Instructions". Document created using STT-dictation technology, any transcriptional errors that may result from process are unintentional.    Patient: Nathan Chambers  Service Category: E/M  Provider: Gillis Santa, MD  DOB: 18-Jan-1966  DOS: 05/12/2020  Specialty: Interventional Pain Management  MRN: [id number]  Setting: Ambulatory outpatient  PCP: Juluis Pitch, MD  Type: Established Patient    Referring Provider: Juluis Pitch, MD  Location: Office  Delivery: Face-to-face     HPI  Nathan Chambers, a 55 y.o. year old male, is here today because of his Sensory polyneuropathy [G60.8]. Nathan Chambers primary complain today is Back Pain (lower) Last encounter: My last encounter with him was on 05/05/2020. Pertinent problems: Nathan Chambers has Arthritis, septic, knee ([place]); Infection of total left knee replacement ([place]); Chronic radicular lumbar pain; History of bilateral knee replacement; Lumbar radicular pain; Spinal stenosis of lumbar region without neurogenic claudication; Neuroforaminal stenosis of lumbar spine; S/P revision of total knee, left; Status post revision of total knee; Chronic pain syndrome; and BMI 40.0-44.9, adult (HCC) on their pertinent problem list. Pain Assessment: Severity of Chronic pain is reported as a 8 /10. Location: Back Lower/both legs, with neuropathy in both feet. Onset: More than a month ago. Quality: Sharp. Timing: Constant. Modifying factor(s): nothing. Vitals:  height is $RemoveB'6\' 3"'fUjTvBoo$  (1.905 m) and weight is 315 lb (142.9 kg) (abnormal). His temporal temperature is 96.8 F (36 C) (abnormal). His blood pressure is 145/86 (abnormal) and his pulse is 101 (abnormal). His respiration is  20 and oxygen saturation is 99%.   Reason for encounter: follow-up evaluation    Patient follows up today.  He states that he has been without his Butrans patch for the last 5 weeks.  He has been very frustrated with the back-and-forth between the insurance company and the pharmacy.  We had completed a prior authorization for him and his Butrans patch was approved starting December 2 however his pharmacy stated that it was not.  As a result he was without the patch states that he has not noticed any increase in his pain or worsening of this pain since being without the patch.  For this reason he feels that he does not want to continue the patch which is reasonable.  Patient continues to have persistent bilateral knee pain.  Given that we are not can to continue the patch I informed the patient that he can follow-up as needed.  Pharmacotherapy Assessment   Analgesic: Butrans patch 15 mcg/hr   Monitoring: [place] PMP: PDMP not reviewed this encounter.       Pharmacotherapy: No side-effects or adverse reactions reported. Compliance: No problems identified. Effectiveness: Clinically acceptable.  Landis Martins, RN  05/12/2020  9:48 AM  Sign when Signing Visit Safety precautions to be maintained throughout the outpatient stay will include: orient to surroundings, keep bed in low position, maintain call bell within reach at all times, provide assistance with transfer out of bed and ambulation.   Has not been able to get Butrans patch due to insurance issues..   UDS:  Summary  Date Value Ref Range Status  10/08/2019 Note  Final    Comment:    ==================================================================== [employer] [facility] (MW) ==================================================================== Test  Result       Flag       Units  Drug Present and Declared for Prescription Verification   Desmethyldiazepam              438          EXPECTED   ng/mg creat    Oxazepam                       749          EXPECTED   ng/mg creat   Temazepam                      >877         EXPECTED   ng/mg creat    Desmethyldiazepam, oxazepam, and temazepam are expected metabolites    of diazepam. Desmethyldiazepam and oxazepam are also expected    metabolites of other drugs, including chlordiazepoxide, prazepam,    clorazepate, and halazepam. Oxazepam is an expected metabolite of    temazepam. Oxazepam and temazepam are also available as scheduled    prescription medications.    Buprenorphine                  4            EXPECTED   ng/mg creat    Sources of buprenorphine include scheduled prescription medications.  ==================================================================== Test                      Result    Flag   Units      Ref Range   Creatinine              228              mg/dL      >=42 ==================================================================== Declared Medications:  The flagging and interpretation on this report are based on the  following declared medications.  Unexpected results may arise from  inaccuracies in the declared medications.   **Note: The testing scope of this panel includes these medications:   Diazepam (Valium)   **Note: The testing scope of this panel does not include small to  moderate amounts of these reported medications:   Buprenorphine Patch (BuTrans)   **Note: The testing scope of this panel does not include the  following reported medications:   Amlodipine (Lotrel)  Aspirin  Benazepril (Lotrel)  Buspirone (Buspar)  Chlorthalidone (Hygroton)  Cyanocobalamin  Duloxetine (Cymbalta)  Folic Acid  Gemfibrozil (Lopid)  Iron  Meloxicam (Mobic)  Multivitamin  Paroxetine (Paxil)  Pravastatin (Pravachol)  Vitamin C ==================================================================== For clinical consultation, please call [phone]. ====================================================================      ROS  Constitutional: Denies any fever or chills Gastrointestinal: No reported hemesis, hematochezia, vomiting, or acute GI distress Musculoskeletal: Bilateral knee pain. Neurological: No reported episodes of acute onset apraxia, aphasia, dysarthria, agnosia, amnesia, paralysis, loss of coordination, or loss of consciousness  Medication Review  Alpha-Lipoic Acid, DULoxetine, PARoxetine, QUEtiapine, amLODipine-benazepril, ascorbic acid, aspirin EC, buPROPion, busPIRone, chlorthalidone, cyanocobalamin, diazepam, folic acid, gemfibrozil, meloxicam, metFORMIN, multivitamin with minerals, and pravastatin  History Review  Allergy: Nathan Chambers is allergic to etodolac. Drug: Nathan Chambers  reports no history of drug use. Alcohol:  reports no history of alcohol use. Tobacco:  reports that he has quit smoking. His smoking use included cigars. He quit smokeless tobacco use about 8 years ago.  His smokeless tobacco use included chew. Social: Nathan Chambers  reports that he  has quit smoking. His smoking use included cigars. He quit smokeless tobacco use about 8 years ago.  His smokeless tobacco use included chew. He reports that he does not drink alcohol and does not use drugs. Medical:  has a past medical history of Anxiety, Arthritis, Bipolar disorder (HCC), Chronic kidney disease, Complication of anesthesia, GERD (gastroesophageal reflux disease), Headache, Hemorrhoids, Hyperlipidemia, Hypertension, Inguinal hernia, LFT elevation, and NAFLD (nonalcoholic fatty liver disease). Surgical: Nathan Chambers  has a past surgical history that includes Colonoscopy; Fracture surgery (Left); Lipoma excision; Vasectomy; Achilles tendon surgery (Right, 10/30/2015); Total knee arthroplasty (Bilateral, 04/19/2016); Colonoscopy with propofol (N/A, 04/07/2017); Knee arthrotomy (Left, 12/08/2017); I & D knee with poly exchange (Left, 12/08/2017); Total  knee revision (Left, 06/11/2018); Joint replacement (Bilateral, 2017); Total knee arthroplasty (Left, 09/11/2018); Total knee revision (Left, 11/27/2018); Patellar tendon repair (Left, 01/10/2019); Hernia repair (Bilateral); and Medial patellofemoral ligament repair (Left, 03/10/2020). Family: family history includes Alcohol abuse in his maternal grandfather; Diabetes in his father; Heart failure in his father.  Laboratory Chemistry Profile   Renal Lab Results  Component Value Date   BUN 11 04/11/2019   CREATININE 0.76 04/11/2019   GFRAA >60 04/11/2019   GFRNONAA >60 04/11/2019     Hepatic Lab Results  Component Value Date   AST 50 (H) 04/11/2019   ALT 39 04/11/2019   ALBUMIN 4.2 04/11/2019   ALKPHOS 89 04/11/2019     Electrolytes Lab Results  Component Value Date   NA 138 04/11/2019   K 3.8 04/11/2019   CL 101 04/11/2019   CALCIUM 9.1 04/11/2019   MG 2.4 01/06/2013     Bone No results found for: VD25OH, VD125OH2TOT, NF9190MW7, RF1449VB0, 25OHVITD1, 25OHVITD2, 25OHVITD3, TESTOFREE, TESTOSTERONE   Inflammation (CRP: Acute Phase) (ESR: Chronic Phase) Lab Results  Component Value Date   CRP 1.2 (H) 08/13/2019   ESRSEDRATE 10 08/13/2019       Note: Above Lab results reviewed.  Recent Imaging Review  DG Abdomen 1 View CLINICAL DATA:  Constipation  EXAM: ABDOMEN - 1 VIEW  COMPARISON:  None.  FINDINGS: There is fairly diffuse stool throughout the colon. There is no appreciable bowel dilatation or air-fluid to suggest bowel obstruction. No free air. Lung bases are clear. No abnormal calcification.  IMPRESSION: Diffuse stool throughout colon, an appearance indicative of constipation. No bowel obstruction or free air.  Electronically Signed   By: Bretta Bang III M.D.   On: 07/10/2019 08:15 Note: Reviewed        Physical Exam  General appearance: Well nourished, well developed, and well hydrated. In no apparent acute distress Mental status: Alert, oriented x  3 (person, place, & time)       Respiratory: No evidence of acute respiratory distress Eyes: PERLA Vitals: BP (!) 145/86   Pulse (!) 101   Temp (!) 96.8 F (36 C) (Temporal)   Resp 20   Ht 6\' 3"  (1.905 m)   Wt (!) 315 lb (142.9 kg)   SpO2 99%   BMI 39.37 kg/m  BMI: Estimated body mass index is 39.37 kg/m as calculated from the following:   Height as of this encounter: 6\' 3"  (1.905 m).   Weight as of this encounter: 315 lb (142.9 kg). Ideal: Ideal body weight: 84.5 kg (186 lb 4.6 oz) Adjusted ideal body weight: 107.9 kg (237 lb 12.4 oz)  Bilateral knee pain, left knee instability.  Surgical scar noted.  Assessment   Status Diagnosis  Persistent Persistent Persistent 1. Sensory polyneuropathy   2. History  of bilateral knee replacement   3. Lumbar facet arthropathy   4. Infection of total left knee replacement, subsequent encounter   5. Lumbar spondylosis   6. Chronic radicular lumbar pain   7. Chronic pain syndrome        Plan of Care  Discontinue Butrans patch.  Not effective.  Prior history of being on high-dose opioid therapy which was not effective.  Is currently on diazepam for anxiety.  Follow-up as needed  Follow-up plan:   Return if symptoms worsen or fail to improve.   Recent Visits No visits were found meeting these conditions. Showing recent visits within past 90 days and meeting all other requirements Today's Visits Date Type Provider Dept  05/12/20 Office Visit Gillis Santa, MD [facility]  Showing today's visits and meeting all other requirements Future Appointments No visits were found meeting these conditions. Showing future appointments within next 90 days and meeting all other requirements  I discussed the assessment and treatment plan with the patient. The patient was provided an opportunity to ask questions and all were answered. The patient agreed with the plan and demonstrated an understanding of the instructions.  Patient advised  to call back or seek an in-person evaluation if the symptoms or condition worsens.  Duration of encounter: 20 minutes.  Note by: Gillis Santa, MD Date: 05/12/2020; Time: 10:18 AM

## 2020-07-24 IMAGING — US US EXTREM LOW VENOUS*L*
1 series · 13 of 24 positions shown · non-contrast
Comparison: None

CLINICAL DATA: Swelling and pain in LEFT leg for 2 days



[Series 1: us extrem low venous*left* · 13 of 34 slices shown]
[im 1/34]
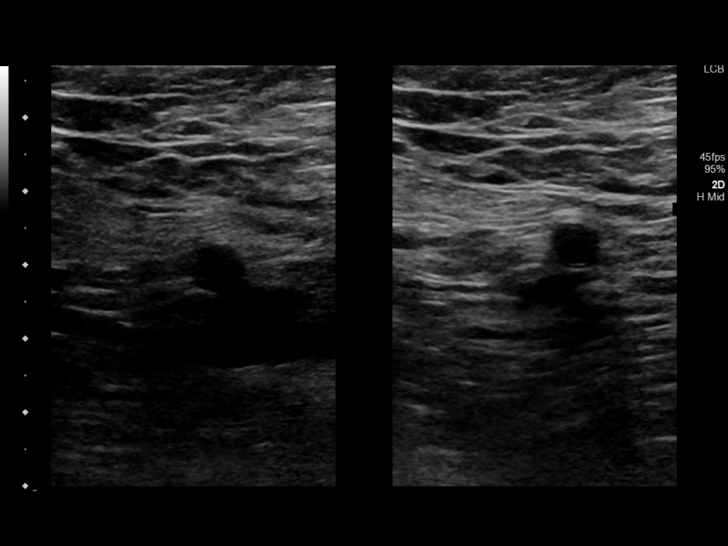
[im 3/34]
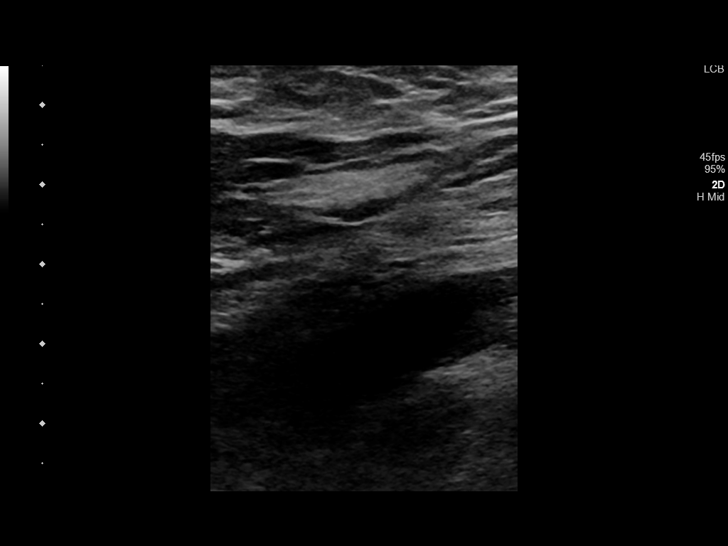
[im 6/34]
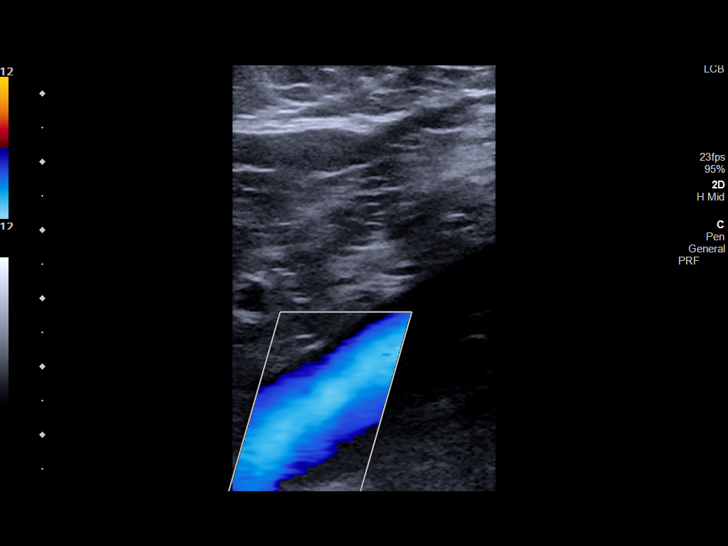
[im 9/34]
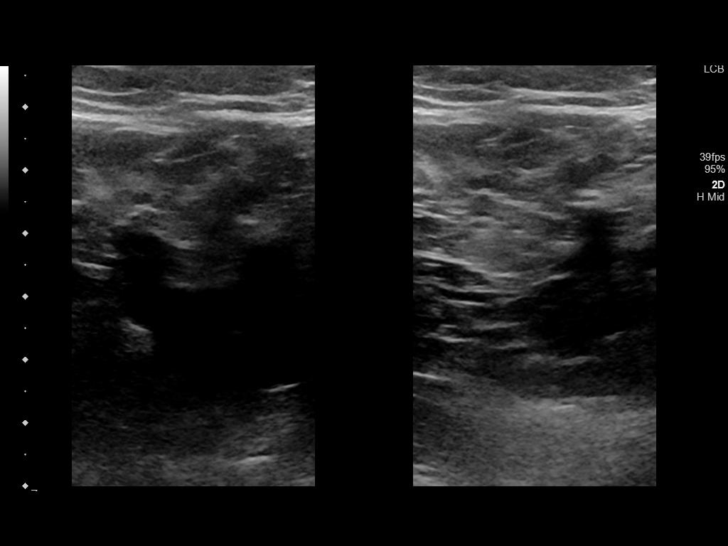
[im 12/34]
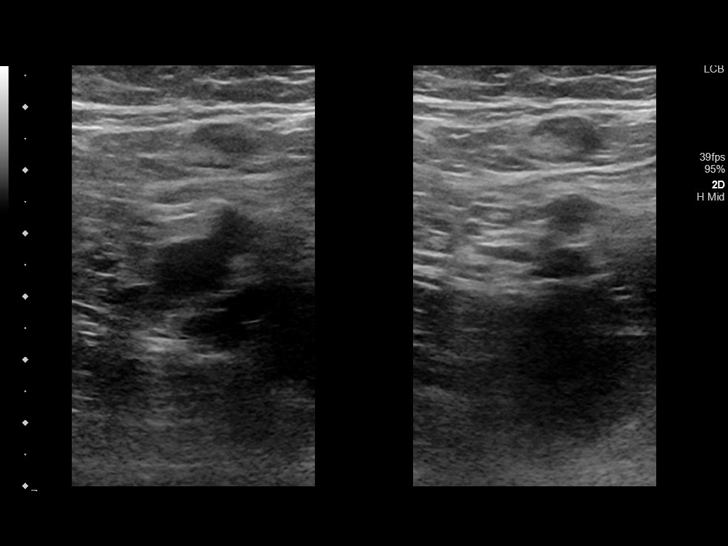
[im 15/34]
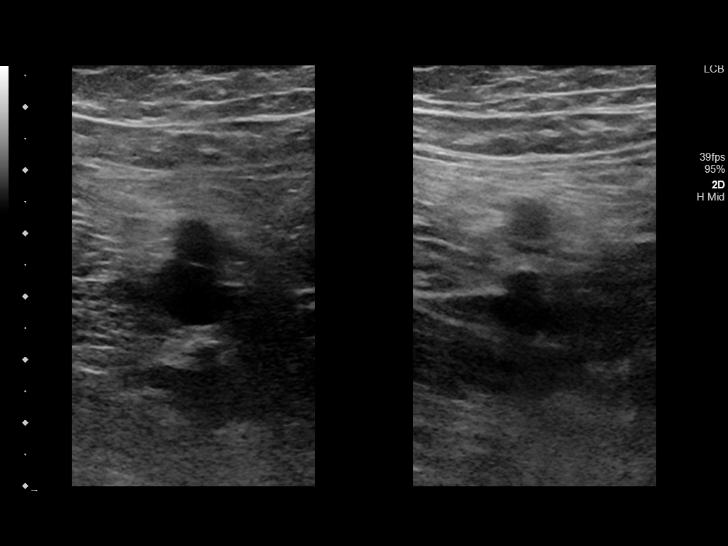
[im 18/34]
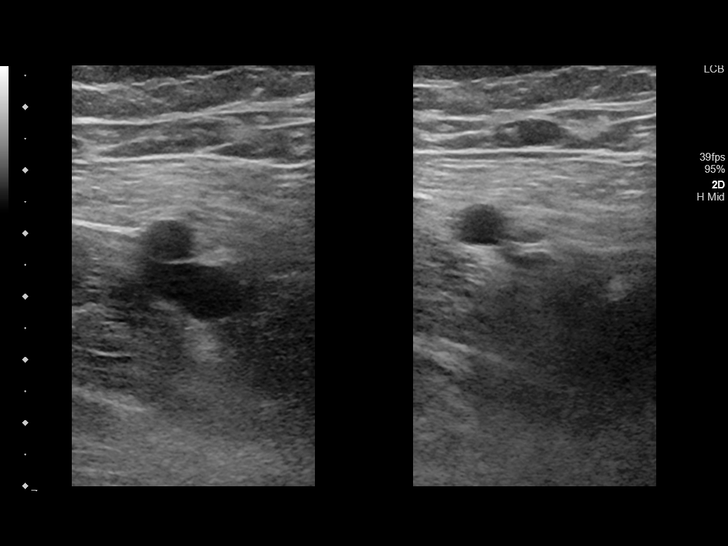
[im 19/34]
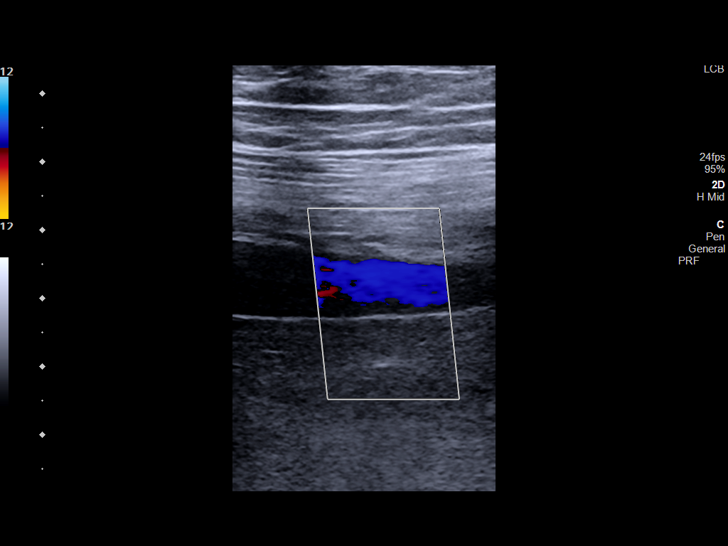
[im 22/34]
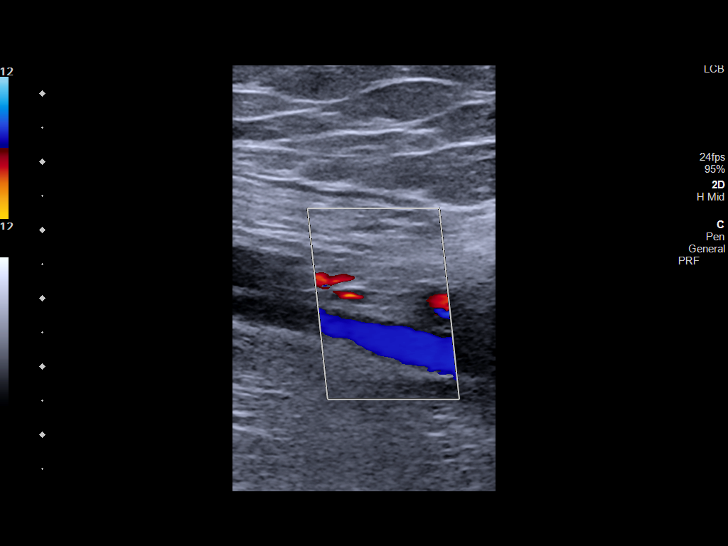
[im 25/34]
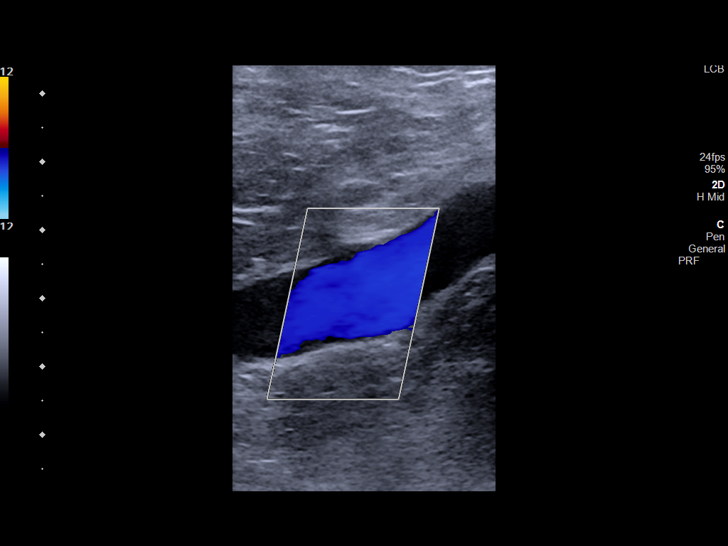
[im 28/34]
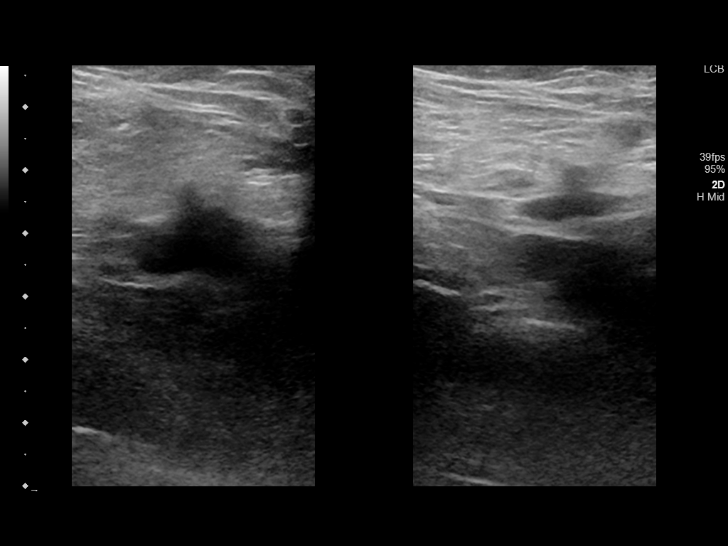
[im 31/34]
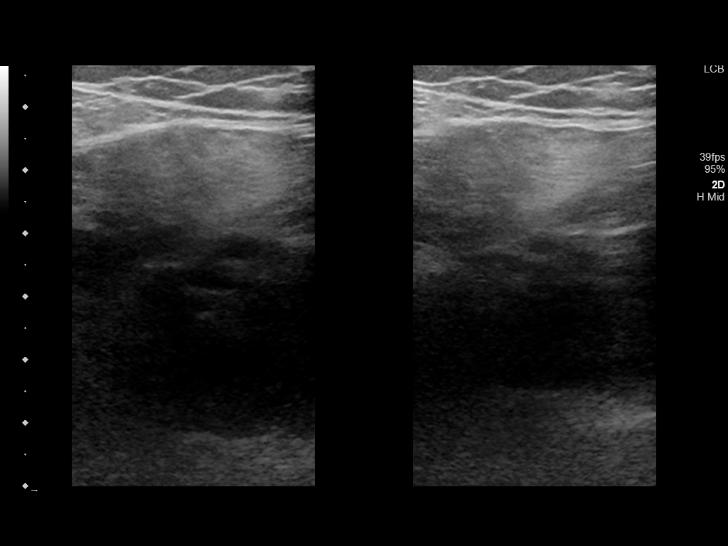
[im 34/34]
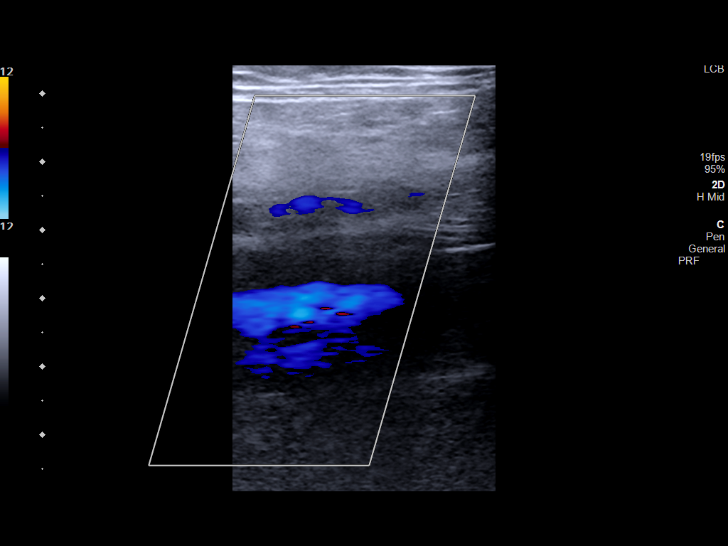

[13 of 24 positions shown; findings below may reference images not displayed]

FINDINGS: Contralateral Common Femoral Vein: Respiratory phasicity is normal
and symmetric with the symptomatic side. No evidence of thrombus.
Normal compressibility.

Common Femoral Vein: No evidence of thrombus. Normal
compressibility, respiratory phasicity and response to augmentation.

Saphenofemoral Junction: No evidence of thrombus. Normal
compressibility and flow on color Doppler imaging.

Profunda Femoral Vein: No evidence of thrombus. Normal
compressibility and flow on color Doppler imaging.

Femoral Vein: No evidence of thrombus. Normal compressibility,
respiratory phasicity and response to augmentation.

Popliteal Vein: No evidence of thrombus. Normal compressibility,
respiratory phasicity and response to augmentation.

Calf Veins: No evidence of thrombus. Normal compressibility and flow
on color Doppler imaging.

Superficial Great Saphenous Vein: No evidence of thrombus. Normal
compressibility.

Venous Reflux:  None.

Other Findings:  None.
IMPRESSION: No evidence of deep venous thrombosis in the LEFT lower extremity.

## 2020-07-24 IMAGING — CR DG KNEE COMPLETE 4+V*L*
1 series · 4 of 4 positions shown · non-contrast
Comparison: 12/08/2017

CLINICAL DATA: LEFT leg pain and swelling, had a bacterial
infection of the leg after an injury with a weedeater last [REDACTED]

EXAM:
LEFT KNEE - COMPLETE 4+ VIEW

[Series 1: dg knee complete 4 views left · 0.14mm/px · 4 of 4 slices shown]
[im 1/4]
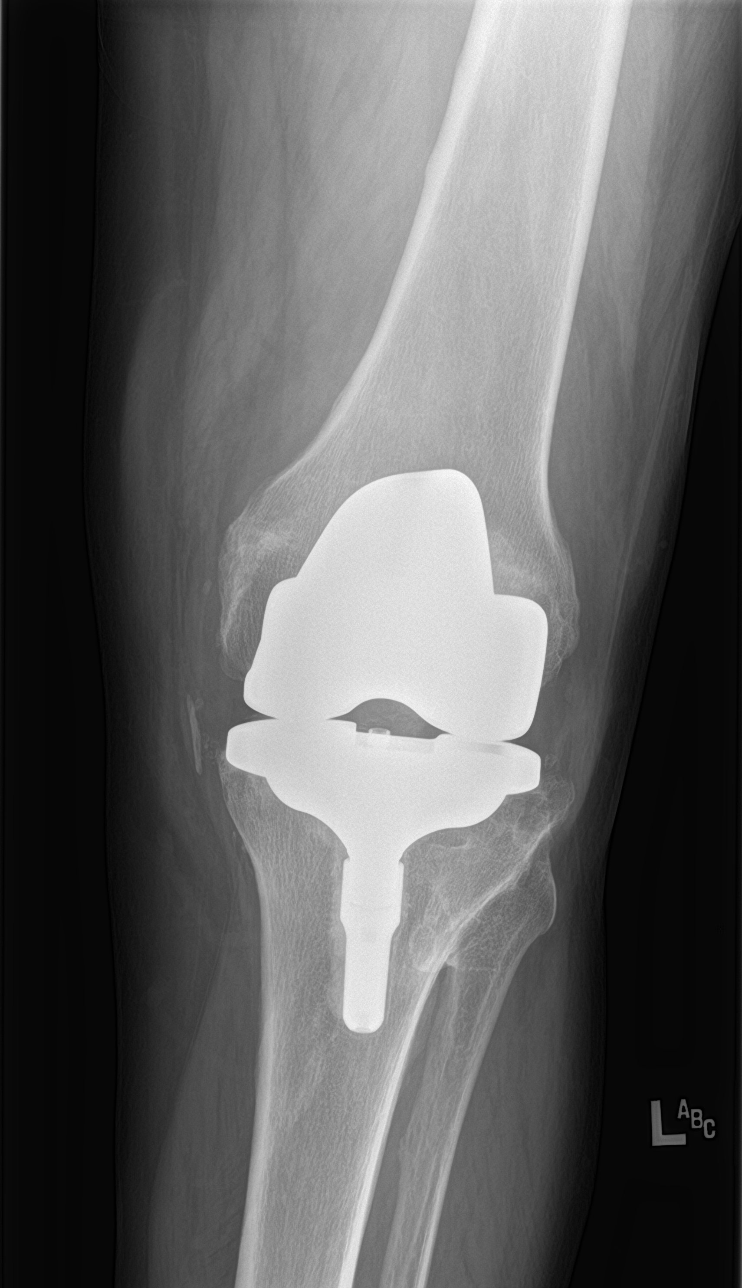
[im 2/4]
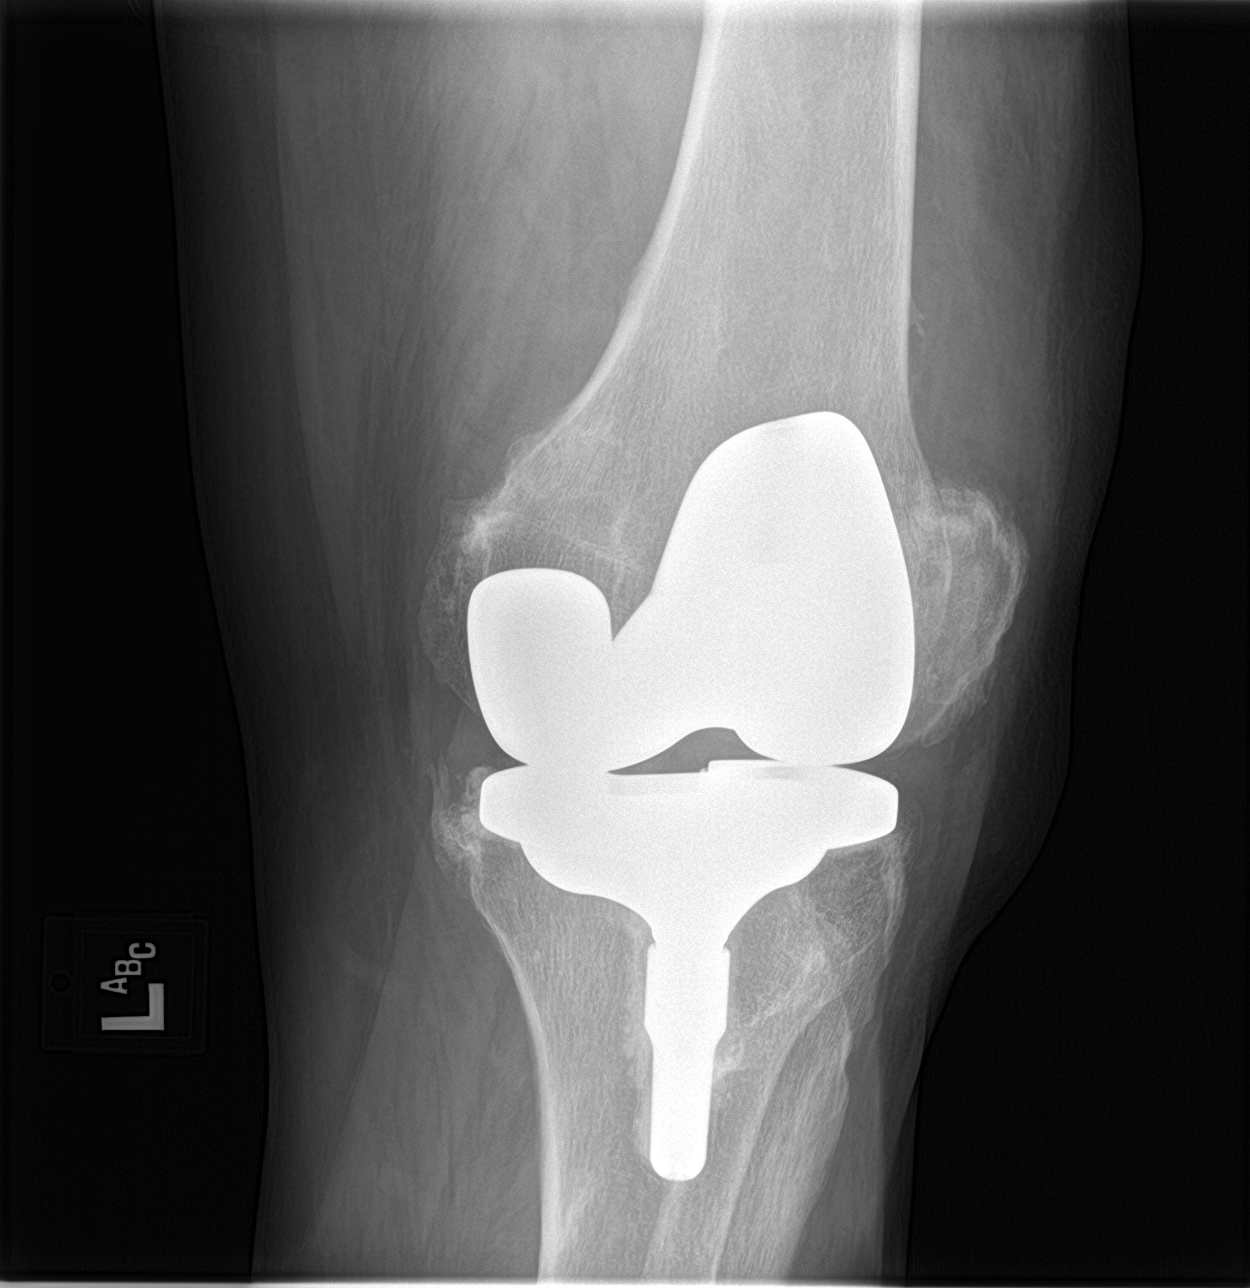
[im 3/4]
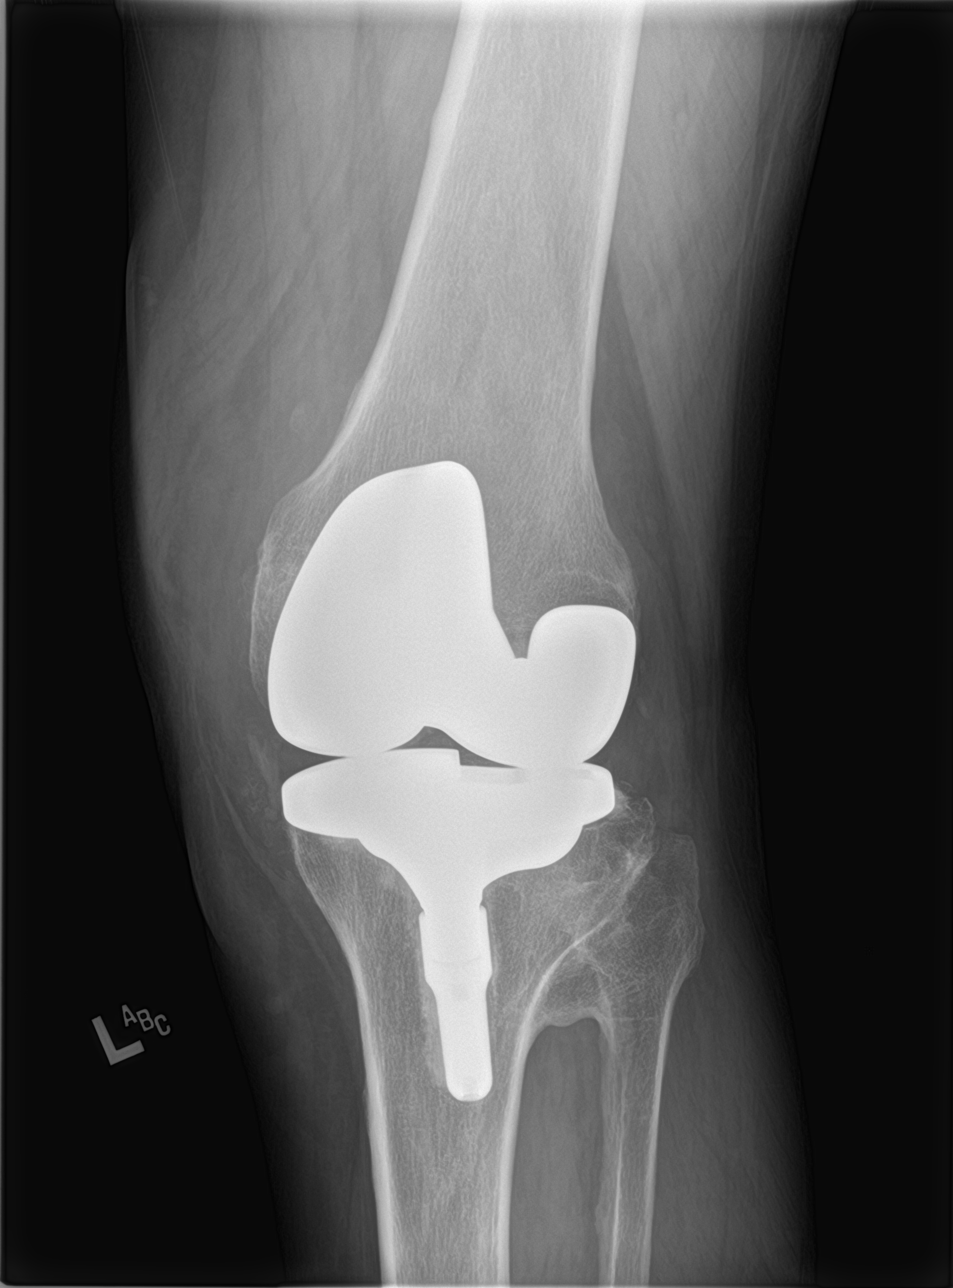
[im 4/4]
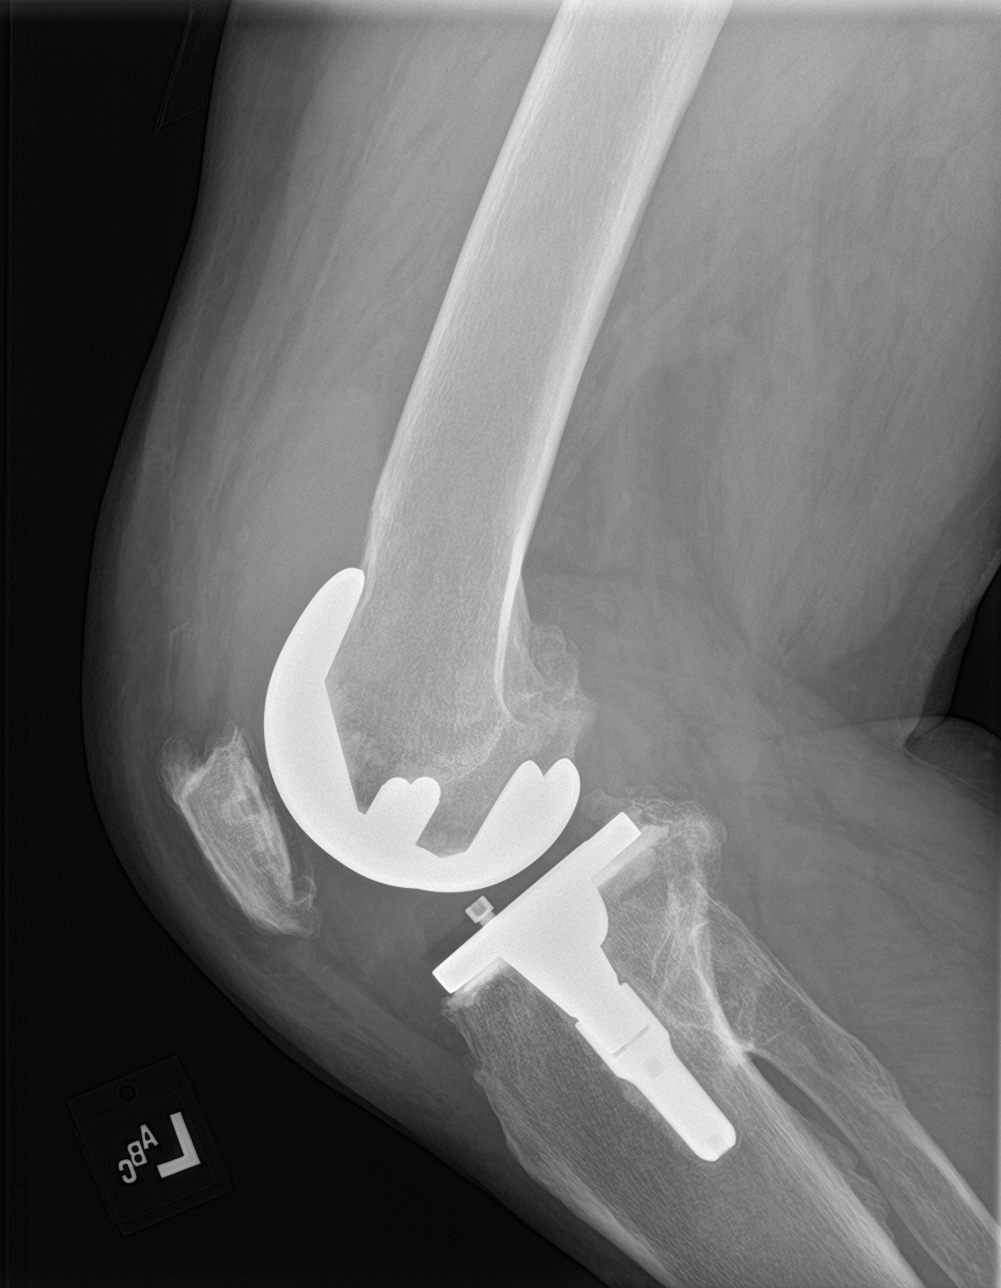

[4 of 4 positions shown; findings below may reference images not displayed]

FINDINGS: Osseous demineralization.

Components of a LEFT knee prosthesis are identified.

No acute fracture or dislocation.

Large joint effusion.

Mild lucency is seen surrounding the stem of the tibial component,
not significantly changed from postoperative images.

Apparent fusion of the proximal tibiofibular joint.
IMPRESSION: LEFT knee prosthesis without definite acute complication.

Large knee joint effusion.

## 2022-02-01 ENCOUNTER — Emergency Department
Admission: EM | Admit: 2022-02-01 | Discharge: 2022-02-01 | Disposition: A | Discharge status: Home / Self Care | Payer: Medicare Other | Attending: Emergency Medicine | Admitting: Emergency Medicine

## 2022-02-01 ENCOUNTER — Encounter: Payer: Self-pay | Admitting: Emergency Medicine

## 2022-02-01 DIAGNOSIS — R220 Localized swelling, mass and lump, head: Secondary | ICD-10-CM | POA: Diagnosis not present

## 2022-02-01 DIAGNOSIS — I10 Essential (primary) hypertension: Secondary | ICD-10-CM | POA: Insufficient documentation

## 2022-02-01 LAB — BASIC METABOLIC PANEL
Anion gap: 10 (ref 5–15)
BUN: 22 mg/dL — ABNORMAL HIGH (ref 6–20)
CO2: 27 mmol/L (ref 22–32)
Calcium: 9.7 mg/dL (ref 8.9–10.3)
Chloride: 100 mmol/L (ref 98–111)
Creatinine, Ser: 1.33 mg/dL — ABNORMAL HIGH (ref 0.61–1.24)
GFR, Estimated: >60 mL/min (ref >60)
Glucose, Bld: 117 mg/dL — ABNORMAL HIGH (ref 70–99)
Potassium: 3.6 mmol/L (ref 3.5–5.1)
Sodium: 137 mmol/L (ref 135–145)

## 2022-02-01 LAB — CBC WITH DIFFERENTIAL/PLATELET
Abs Immature Granulocytes: 0.04 10*3/uL (ref 0.00–0.07)
Basophils Absolute: 0.1 10*3/uL (ref 0.0–0.1)
Basophils Relative: 1 %
Eosinophils Absolute: 0.4 10*3/uL (ref 0.0–0.5)
Eosinophils Relative: 3 %
HCT: 42.1 % (ref 39.0–52.0)
Hemoglobin: 14.2 g/dL (ref 13.0–17.0)
Immature Granulocytes: 0 %
Lymphocytes Relative: 16 %
Lymphs Abs: 1.9 10*3/uL (ref 0.7–4.0)
MCH: 30.3 pg (ref 26.0–34.0)
MCHC: 33.7 g/dL (ref 30.0–36.0)
MCV: 89.8 fL (ref 80.0–100.0)
Monocytes Absolute: 0.9 10*3/uL (ref 0.1–1.0)
Monocytes Relative: 8 %
Neutro Abs: 9 10*3/uL — ABNORMAL HIGH (ref 1.7–7.7)
Neutrophils Relative %: 72 %
Platelets: 351 10*3/uL (ref 150–400)
RBC: 4.69 MIL/uL (ref 4.22–5.81)
RDW: 12.4 % (ref 11.5–15.5)
WBC: 12.4 10*3/uL — ABNORMAL HIGH (ref 4.0–10.5)
nRBC: 0 % (ref 0.0–0.2)

## 2022-02-01 MED ORDER — DIPHENHYDRAMINE HCL 50 MG/ML IJ SOLN
50.0000 mg | Freq: Once | INTRAMUSCULAR | Status: AC
  Administered 2022-02-01: 50 mg via INTRAVENOUS
  Filled 2022-02-01: qty 1

## 2022-02-01 MED ORDER — METHYLPREDNISOLONE SODIUM SUCC 125 MG IJ SOLR
125.0000 mg | INTRAMUSCULAR | Status: AC
  Administered 2022-02-01: 125 mg via INTRAVENOUS
  Filled 2022-02-01: qty 2

## 2022-02-01 MED ORDER — DIAZEPAM 5 MG/ML IJ SOLN
5.0000 mg | Freq: Once | INTRAMUSCULAR | Status: AC
  Administered 2022-02-01: 5 mg via INTRAVENOUS
  Filled 2022-02-01: qty 2

## 2022-02-01 MED ORDER — PREDNISONE 20 MG PO TABS: 40.0000 mg | ORAL_TABLET | Freq: Every day | ORAL | 0 refills | Status: AC

## 2022-02-01 NOTE — ED Notes (Signed)
DC instruction given verbally and in writing... understanding voiced, signature obtained.  Pt left in stable condition with spouse to POV.

## 2022-02-01 NOTE — ED Triage Notes (Signed)
Pt with swollen tongue since 2000 after eating [place] stew. Pt reports difficulty swallowing as well as SOB. Right side of tongue more swollen then left. Unknown allergy other than Etodolac.

## 2022-02-01 NOTE — ED Notes (Signed)
All incisions on pt from previous surgeries have closed and deemed healed thus closed out/DCd in the computer.

## 2022-02-01 NOTE — ED Provider Notes (Addendum)
[facility] Provider Note    Event Date/Time   First MD Initiated Contact with Patient 02/01/22 2203     (approximate)   History   Chief Complaint: Oral Swelling and Allergic Reaction   HPI  Nathan Chambers is a 56 y.o. male with a history of hypertension and anxiety who comes the ED complaining of swelling of the right side of the tongue he has no known food allergies.  No changes in medication.  Does not take ACE/ARB.  He was in his usual state of health throughout the day today.  He ate some packaged [place] stew for dinner at 7:00 PM, and at 8:00 he started noticing odd sensation and swelling of the tongue.  Denies any trouble breathing or swallowing.  Denies fever or hoarse/muffled voice.  Denies any history of facial swelling.  No recent trauma.  Denies any history of seizure disorder or recent passing out episodes.  Denies history of diabetes.     Physical Exam   Triage Vital Signs: ED Triage Vitals  Enc Vitals Group     BP 02/01/22 2201 (!) 149/77     Pulse Rate 02/01/22 2201 77     Resp 02/01/22 2201 (!) 22     Temp 02/01/22 2201 99.5 F (37.5 C)     Temp Source 02/01/22 2201 Oral     SpO2 02/01/22 2201 97 %     Weight --      Height 02/01/22 2200 '6\' 3"'$  (1.905 m)     Head Circumference --      Peak Flow --      Pain Score --      Pain Loc --      Pain Edu? --      Excl. in [place]? --     Most recent vital signs: Vitals:   02/01/22 2201  BP: (!) 149/77  Pulse: 77  Resp: (!) 22  Temp: 99.5 F (37.5 C)  SpO2: 97%    General: Awake, no distress.  CV:  Good peripheral perfusion.  Regular rate and rhythm Resp:  Normal effort.  Clear to auscultation bilaterally Abd:  No distention.  Other:  No facial edema.  Floor mouth is soft without edema.  Submental space is flat and soft.  The right side of the tongue has diffuse edema with mild hyperemia.  This is centered around a small lateral tongue abrasion without tissue avulsion or  laceration.  There is no bleeding.  No drainage.  Oropharyngeal space is normal without tonsillar swelling or uvular deviation.  Normal soft palate mobility.   ED Results / Procedures / Treatments   Labs (all labs ordered are listed, but only abnormal results are displayed) Labs Reviewed  BASIC METABOLIC PANEL  CBC WITH DIFFERENTIAL/PLATELET     EKG    RADIOLOGY    PROCEDURES:  Procedures   MEDICATIONS ORDERED IN ED: Medications  methylPREDNISolone sodium succinate (SOLU-MEDROL) 125 mg/2 mL injection 125 mg (has no administration in time range)  diphenhydrAMINE (BENADRYL) injection 50 mg (has no administration in time range)  diazepam (VALIUM) injection 5 mg (has no administration in time range)     IMPRESSION / MDM / ASSESSMENT AND PLAN / ED COURSE  I reviewed the triage vital signs and the nursing notes.                              Differential diagnosis includes, but is not limited  to, allergic reaction, glossitis secondary to minor trauma.  Doubt RPA PTA hereditary angioedema anaphylaxis or infection.  Patient's presentation is most consistent with acute presentation with potential threat to life or bodily function.  Patient presents with swelling of the right side of the tongue which I think is glossitis from minor trauma causing abrasion of that lateral edge.  Possibly from inadvertent tongue mastication.  Will give Benadryl and prednisone on the chance that this could be allergic, observe in ED to ensure he is not having increasing swelling.  Plan for outpatient follow-up with a short course of prednisone.   ----------------------------------------- 11:12 PM on 02/01/2022 ----------------------------------------- Tongue swelling is markedly improved.  Patient feels very well and is able to talk in long paragraphs with clear speech.  Stable for discharge.      FINAL CLINICAL IMPRESSION(S) / ED DIAGNOSES   Final diagnoses:  Tongue swelling     Rx /  DC Orders   ED Discharge Orders          Ordered    predniSONE (DELTASONE) 20 MG tablet  Daily with breakfast        02/01/22 2213             Note:  This document was prepared using Dragon voice recognition software and may include unintentional dictation errors.   Carrie Mew, MD 02/01/22 2222    Carrie Mew, MD 02/01/22 2312

## 2022-12-07 ENCOUNTER — Encounter: Payer: Self-pay | Admitting: Internal Medicine

## 2022-12-13 ENCOUNTER — Encounter: Payer: Self-pay | Admitting: Internal Medicine

## 2022-12-14 ENCOUNTER — Ambulatory Visit
Admission: RE | Admit: 2022-12-14 | Discharge: 2022-12-14 | Disposition: A | Discharge status: Home / Self Care | Payer: Medicare Other | Attending: Internal Medicine | Admitting: Internal Medicine

## 2022-12-14 ENCOUNTER — Encounter: Payer: Self-pay | Admitting: Internal Medicine

## 2022-12-14 ENCOUNTER — Ambulatory Visit: Payer: Medicare Other | Admitting: Anesthesiology

## 2022-12-14 ENCOUNTER — Encounter
Admission: RE | Disposition: A | Discharge status: Home / Self Care | Payer: Self-pay | Source: Home / Self Care | Attending: Internal Medicine

## 2022-12-14 DIAGNOSIS — N189 Chronic kidney disease, unspecified: Secondary | ICD-10-CM | POA: Insufficient documentation

## 2022-12-14 DIAGNOSIS — Z87891 Personal history of nicotine dependence: Secondary | ICD-10-CM | POA: Insufficient documentation

## 2022-12-14 DIAGNOSIS — I129 Hypertensive chronic kidney disease with stage 1 through stage 4 chronic kidney disease, or unspecified chronic kidney disease: Secondary | ICD-10-CM | POA: Insufficient documentation

## 2022-12-14 DIAGNOSIS — F419 Anxiety disorder, unspecified: Secondary | ICD-10-CM | POA: Insufficient documentation

## 2022-12-14 DIAGNOSIS — K640 First degree hemorrhoids: Secondary | ICD-10-CM | POA: Insufficient documentation

## 2022-12-14 DIAGNOSIS — Z1211 Encounter for screening for malignant neoplasm of colon: Secondary | ICD-10-CM | POA: Diagnosis not present

## 2022-12-14 DIAGNOSIS — Z86010 Personal history of colonic polyps: Secondary | ICD-10-CM | POA: Diagnosis present

## 2022-12-14 DIAGNOSIS — F319 Bipolar disorder, unspecified: Secondary | ICD-10-CM | POA: Insufficient documentation

## 2022-12-14 HISTORY — DX: Panic disorder (episodic paroxysmal anxiety): F41.0

## 2022-12-14 HISTORY — PX: COLONOSCOPY WITH PROPOFOL: SHX5780

## 2022-12-14 HISTORY — DX: Unspecified osteoarthritis, unspecified site: M19.90

## 2022-12-14 SURGERY — COLONOSCOPY WITH PROPOFOL
Anesthesia: General

## 2022-12-14 MED ORDER — LIDOCAINE HCL (PF) 2 % IJ SOLN
INTRAMUSCULAR | Status: AC
  Filled 2022-12-14: qty 5

## 2022-12-14 MED ORDER — LIDOCAINE HCL (CARDIAC) PF 100 MG/5ML IV SOSY
PREFILLED_SYRINGE | INTRAVENOUS | Status: DC | PRN
  Administered 2022-12-14: 80 mg via INTRAVENOUS

## 2022-12-14 MED ORDER — PROPOFOL 1000 MG/100ML IV EMUL
INTRAVENOUS | Status: AC
  Filled 2022-12-14: qty 100

## 2022-12-14 MED ORDER — SODIUM CHLORIDE 0.9 % IV SOLN
INTRAVENOUS | Status: DC
  Administered 2022-12-14: 1000 mL via INTRAVENOUS

## 2022-12-14 MED ORDER — PROPOFOL 10 MG/ML IV BOLUS
INTRAVENOUS | Status: DC | PRN
  Administered 2022-12-14: 50 mg via INTRAVENOUS

## 2022-12-14 MED ORDER — PROPOFOL 500 MG/50ML IV EMUL
INTRAVENOUS | Status: DC | PRN
  Administered 2022-12-14: 125 ug/kg/min via INTRAVENOUS

## 2022-12-14 NOTE — Transfer of Care (Signed)
Immediate Anesthesia Transfer of Care Note  Patient: Nathan Chambers  Procedure(s) Performed: COLONOSCOPY WITH PROPOFOL  Patient Location: PACU  Anesthesia Type:General  Level of Consciousness: sedated  Airway & Oxygen Therapy: Patient Spontanous Breathing  Post-op Assessment: Report given to RN and Post -op Vital signs reviewed and stable  Post vital signs: Reviewed and stable  Last Vitals:  Vitals Value Taken Time  BP    Temp    Pulse    Resp    SpO2      Last Pain:  Vitals:   12/14/22 0806  TempSrc: Temporal  PainSc: 0-No pain         Complications: No notable events documented.

## 2022-12-14 NOTE — Op Note (Signed)
[facility] Patient Name: Nathan Chambers Procedure Date: 12/14/2022 8:46 AM MRN: [id number] Account #: [id number] Date of Birth: [birth year] Admit Type: Outpatient Age: 57 Room: [facility] ENDO ROOM 2 Gender: Male Note Status: Finalized Instrument Name: Prentice Docker [id number] Procedure:             Colonoscopy Indications:           Surveillance: Personal history of adenomatous polyps                         on last colonoscopy > 5 years ago Providers:             Royce Macadamia K. Norma Fredrickson MD, MD Referring MD:          Teena Irani. Terance Hart, MD (Referring MD) Medicines:             Propofol per Anesthesia Complications:         No immediate complications. Procedure:             Pre-Anesthesia Assessment:                        - The risks and benefits of the procedure and the                         sedation options and risks were discussed with the                         patient. All questions were answered and informed                         consent was obtained.                        - Patient identification and proposed procedure were                         verified prior to the procedure by the nurse. The                         procedure was verified in the procedure room.                        - ASA Grade Assessment: II - A patient with mild                         systemic disease.                        - After reviewing the risks and benefits, the patient                         was deemed in satisfactory condition to undergo the                         procedure.                        After obtaining informed consent, the colonoscope was  passed under direct vision. Throughout the procedure,                         the patient's blood pressure, pulse, and oxygen                         saturations were monitored continuously. The                         Colonoscope was introduced through the anus and                          advanced to the the cecum, identified by appendiceal                         orifice and ileocecal valve. The colonoscopy was                         performed without difficulty. The patient tolerated                         the procedure well. The quality of the bowel                         preparation was adequate. The ileocecal valve,                         appendiceal orifice, and rectum were photographed. Findings:      The perianal and digital rectal examinations were normal. Pertinent       negatives include normal sphincter tone and no palpable rectal lesions.      Non-bleeding internal hemorrhoids were found during retroflexion. The       hemorrhoids were Grade I (internal hemorrhoids that do not prolapse).      Normal mucosa was found in the entire colon.      There is no endoscopic evidence of inflammation, mass or polyps in the       entire colon. Impression:            - Non-bleeding internal hemorrhoids.                        - Normal mucosa in the entire examined colon.                        - No specimens collected. Recommendation:        - Patient has a contact number available for                         emergencies. The signs and symptoms of potential                         delayed complications were discussed with the patient.                         Return to normal activities tomorrow. Written                         discharge instructions were provided to the patient.                        -  Resume previous diet.                        - Continue present medications.                        - Repeat colonoscopy in 10 years for screening                         purposes.                        - Return to GI office PRN.                        - The findings and recommendations were discussed with                         the patient. Procedure Code(s):     --- Professional ---                        O7564, Colorectal cancer screening; colonoscopy on                          individual at high risk Diagnosis Code(s):     --- Professional ---                        K64.0, First degree hemorrhoids                        Z86.010, Personal history of colonic polyps CPT copyright 2022 [employer]. All rights reserved. The codes documented in this report are preliminary and upon coder review may  be revised to meet current compliance requirements. Stanton Kidney MD, MD 12/14/2022 9:23:32 AM This report has been signed electronically. Number of Addenda: 0 Note Initiated On: 12/14/2022 8:46 AM Scope Withdrawal Time: 0 hours 8 minutes 24 seconds  Total Procedure Duration: 0 hours 13 minutes 46 seconds  Estimated Blood Loss:  Estimated blood loss: none.      [facility]

## 2022-12-14 NOTE — H&P (Signed)
Outpatient short stay form Pre-procedure 12/14/2022 8:55 AM  Nathan Chambers, M.D.  Primary Physician: [name], M.D.  Reason for visit:  Personal history of colon polyps  History of present illness:                            Patient presents for colonoscopy for a personal hx of colon polyps. The patient denies abdominal pain, abnormal weight loss or rectal bleeding.      Current Facility-Administered Medications:    0.9 %  sodium chloride infusion, , Intravenous, Continuous, [place], Boykin Nearing, MD, Last Rate: 20 mL/hr at 12/14/22 0823, 1,000 mL at 12/14/22 1610  Medications Prior to Admission  Medication Sig Dispense Refill Last Dose   Alpha-Lipoic Acid 600 MG CAPS Take 600 mg by mouth daily.   12/13/2022   amLODipine-benazepril (LOTREL) 10-40 MG capsule Take 1 capsule by mouth every morning.    12/13/2022   ascorbic acid (VITAMIN C) 500 MG tablet Take 500 mg by mouth daily.   12/13/2022   aspirin EC 81 MG tablet Take 81 mg by mouth daily. On hold for procedure   12/13/2022   buPROPion (WELLBUTRIN XL) 300 MG 24 hr tablet Take 300 mg by mouth at bedtime.   12/13/2022   busPIRone (BUSPAR) 5 MG tablet Take 5 mg by mouth 2 (two) times daily.    12/13/2022   chlorthalidone (HYGROTON) 25 MG tablet Take 12.5 mg by mouth daily.   12/13/2022   cyanocobalamin 1000 MCG tablet Take 1,000 mcg by mouth daily.   12/13/2022   diazepam (VALIUM) 5 MG tablet Take 2.5-5 mg by mouth daily as needed for anxiety.    12/13/2022   DULoxetine (CYMBALTA) 60 MG capsule Take 60 mg by mouth every morning.    12/13/2022   folic acid (FOLVITE) 1 MG tablet Take 1 mg by mouth daily.    12/13/2022   gemfibrozil (LOPID) 600 MG tablet Take 600 mg by mouth 2 (two) times daily before a meal.   12/13/2022   meloxicam (MOBIC) 15 MG tablet Take 15 mg by mouth daily.   12/13/2022   metFORMIN (GLUCOPHAGE-XR) 750 MG 24 hr tablet Take 2,250 mg by mouth daily.   12/13/2022   Multiple Vitamins-Minerals (MULTIVITAMIN WITH MINERALS) tablet  Take 1 tablet by mouth daily.   12/13/2022   PARoxetine (PAXIL) 20 MG tablet Take 60 mg by mouth daily.    12/13/2022   pravastatin (PRAVACHOL) 20 MG tablet Take 20 mg by mouth every evening.    12/13/2022   QUEtiapine (SEROQUEL) 50 MG tablet Take 50 mg by mouth at bedtime.   12/13/2022     Allergies  Allergen Reactions   Etodolac Itching     Past Medical History:  Diagnosis Date   Anxiety    Arthritis    knees, hands   Bipolar disorder (HCC)    Chronic kidney disease    arf 2014 due to dehydration   Complication of anesthesia    woke up during last knee surgery feb 2020   GERD (gastroesophageal reflux disease)    rare no meds   Headache    Hemorrhoids    Hyperlipidemia    Hypertension    Inguinal hernia    LFT elevation    NAFLD (nonalcoholic fatty liver disease)    Osteoarthritis    Panic attacks     Review of systems:  Otherwise negative.    Physical Exam  Gen: Alert, oriented. Appears stated  age.  HEENT: [place]/AT. PERRLA. Lungs: CTA, no wheezes. CV: RR nl S1, S2. Abd: soft, benign, no masses. BS+ Ext: No edema. Pulses 2+    Planned procedures: Proceed with colonoscopy. The patient understands the nature of the planned procedure, indications, risks, alternatives and potential complications including but not limited to bleeding, infection, perforation, damage to internal organs and possible oversedation/side effects from anesthesia. The patient agrees and gives consent to proceed.  Please refer to procedure notes for findings, recommendations and patient disposition/instructions.      Nathan Chambers, M.D. Gastroenterology 12/14/2022  8:55 AM

## 2022-12-14 NOTE — Interval H&P Note (Signed)
History and Physical Interval Note:  12/14/2022 8:55 AM  Nathan Chambers  has presented today for surgery, with the diagnosis of Z86.010 (ICD-10-CM) - History of colon polyps.  The various methods of treatment have been discussed with the patient and family. After consideration of risks, benefits and other options for treatment, the patient has consented to  Procedure(s): COLONOSCOPY WITH PROPOFOL (N/A) as a surgical intervention.  The patient's history has been reviewed, patient examined, no change in status, stable for surgery.  I have reviewed the patient's chart and labs.  Questions were answered to the patient's satisfaction.     [place], [place]

## 2022-12-14 NOTE — Anesthesia Preprocedure Evaluation (Signed)
Anesthesia Evaluation  Patient identified by MRN, date of birth, ID band Patient awake    Reviewed: Allergy & Precautions, H&P , NPO status , Patient's Chart, lab work & pertinent test results, reviewed documented beta blocker date and time   History of Anesthesia Complications (+) history of anesthetic complications  Airway Mallampati: II   Neck ROM: full    Dental  (+) Poor Dentition   Pulmonary neg pulmonary ROS, former smoker   Pulmonary exam normal        Cardiovascular Exercise Tolerance: Good hypertension, On Medications negative cardio ROS Normal cardiovascular exam Rhythm:regular Rate:Normal     Neuro/Psych  Headaches PSYCHIATRIC DISORDERS Anxiety  Bipolar Disorder    Neuromuscular disease    GI/Hepatic Neg liver ROS,GERD  Medicated,,  Endo/Other  negative endocrine ROS    Renal/GU Renal disease  negative genitourinary   Musculoskeletal   Abdominal   Peds  Hematology negative hematology ROS (+)   Anesthesia Other Findings Past Medical History: No date: Anxiety No date: Arthritis     Comment:  knees, hands No date: Bipolar disorder (HCC) No date: Chronic kidney disease     Comment:  arf 2014 due to dehydration No date: Complication of anesthesia     Comment:  woke up during last knee surgery feb 2020 No date: GERD (gastroesophageal reflux disease)     Comment:  rare no meds No date: Headache No date: Hemorrhoids No date: Hyperlipidemia No date: Hypertension No date: Inguinal hernia No date: LFT elevation No date: NAFLD (nonalcoholic fatty liver disease) No date: Osteoarthritis No date: Panic attacks Past Surgical History: 10/30/2015: ACHILLES TENDON SURGERY; Right     Comment:  Procedure: ACHILLES TENDON REPAIR/ HEGLAND OSTECTOMY;                Surgeon: Gwyneth Revels, DPM;  Location: [facility] ORS;                Service: Podiatry;  Laterality: Right; No date: COLONOSCOPY 04/07/2017: COLONOSCOPY  WITH PROPOFOL; N/A     Comment:  Procedure: COLONOSCOPY WITH PROPOFOL;  Surgeon: [place],               Boykin Nearing, MD;  Location: [facility] ENDOSCOPY;  Service:               Gastroenterology;  Laterality: N/A; No date: FRACTURE SURGERY; Left     Comment:  MIDDLE FINGER No date: HERNIA REPAIR; Bilateral     Comment:  inguinal 12/08/2017: I & D KNEE WITH POLY EXCHANGE; Left     Comment:  Procedure: IRRIGATION AND DEBRIDEMENT KNEE WITH POLY               EXCHANGE;  Surgeon: Donato Heinz, MD;  Location: [facility]               ORS;  Service: Orthopedics;  Laterality: Left; 2017: JOINT REPLACEMENT; Bilateral     Comment:  bilateral knees 12/08/2017: KNEE ARTHROTOMY; Left     Comment:  Procedure: KNEE ARTHROTOMY, I&D, WASH OUT, POLY               EXCHANGE;  Surgeon: Donato Heinz, MD;  Location: [facility]               ORS;  Service: Orthopedics;  Laterality: Left; No date: LIPOMA EXCISION 03/10/2020: MEDIAL PATELLOFEMORAL LIGAMENT REPAIR; Left     Comment:  Procedure: MEDIAL PATELLA FEMORAL LIGAMENT               RECONSTRUCTION;  Surgeon: Kennedy Bucker, MD;  Location:               [facility] ORS;  Service: Orthopedics;  Laterality: Left; 01/10/2019: PATELLAR TENDON REPAIR; Left     Comment:  Procedure: LEFT MEDIAL RETINACULAR REPAIR;  Surgeon:               Kennedy Bucker, MD;  Location: [facility] ORS;  Service:               Orthopedics;  Laterality: Left; 04/19/2016: TOTAL KNEE ARTHROPLASTY; Bilateral     Comment:  Procedure: TOTAL KNEE BILATERAL;  Surgeon: Kennedy Bucker,              MD;  Location: [facility] ORS;  Service: Orthopedics;                Laterality: Bilateral; 09/11/2018: TOTAL KNEE ARTHROPLASTY; Left     Comment:  Procedure: TOTAL KNEE ARTHROPLASTY - LEFT - HARDWARE               REMOVAL WITH INSERTION OF ANTIBIOTIC SPACER;  Surgeon:               Kennedy Bucker, MD;  Location: [facility] ORS;  Service:               Orthopedics;  Laterality: Left; 06/11/2018: TOTAL KNEE REVISION; Left     Comment:   Procedure: INCISION AND DRAINAGE OF LEFT KNEE, POLY               EXCHANGE;  Surgeon: Kennedy Bucker, MD;  Location: [facility]               ORS;  Service: Orthopedics;  Laterality: Left; 11/27/2018: TOTAL KNEE REVISION; Left     Comment:  Procedure: TOTAL KNEE REVISION REIMPLANTATION OF LEFT               TOTAL KNEE;  Surgeon: Kennedy Bucker, MD;  Location: [facility]               ORS;  Service: Orthopedics;  Laterality: Left; No date: VASECTOMY   Reproductive/Obstetrics negative OB ROS                             Anesthesia Physical Anesthesia Plan  ASA: 3  Anesthesia Plan: General   Post-op Pain Management:    Induction:   PONV Risk Score and Plan:   Airway Management Planned:   Additional Equipment:   Intra-op Plan:   Post-operative Plan:   Informed Consent: I have reviewed the patients History and Physical, chart, labs and discussed the procedure including the risks, benefits and alternatives for the proposed anesthesia with the patient or authorized representative who has indicated his/her understanding and acceptance.     Dental Advisory Given  Plan Discussed with: CRNA  Anesthesia Plan Comments:        Anesthesia Quick Evaluation

## 2022-12-14 NOTE — Anesthesia Postprocedure Evaluation (Signed)
Anesthesia Post Note  Patient: Nathan Chambers  Procedure(s) Performed: COLONOSCOPY WITH PROPOFOL  Patient location during evaluation: PACU Anesthesia Type: General Level of consciousness: awake and alert Pain management: pain level controlled Vital Signs Assessment: post-procedure vital signs reviewed and stable Respiratory status: spontaneous breathing, nonlabored ventilation, respiratory function stable and patient connected to nasal cannula oxygen Cardiovascular status: blood pressure returned to baseline and stable Postop Assessment: no apparent nausea or vomiting Anesthetic complications: no   No notable events documented.   Last Vitals:  Vitals:   12/14/22 0923 12/14/22 0933  BP: 108/71 97/62  Pulse: 73 71  Resp: 20 20  Temp:  (!) 35.8 C  SpO2: 96% 97%    Last Pain:  Vitals:   12/14/22 0933  TempSrc: Temporal  PainSc: 0-No pain                 Yevette Edwards

## 2022-12-15 ENCOUNTER — Encounter: Payer: Self-pay | Admitting: Internal Medicine

## 2023-05-09 ENCOUNTER — Emergency Department: Payer: Medicare Other

## 2023-05-09 ENCOUNTER — Other Ambulatory Visit: Payer: Self-pay

## 2023-05-09 ENCOUNTER — Emergency Department
Admission: EM | Admit: 2023-05-09 | Discharge: 2023-05-09 | Disposition: A | Discharge status: Home / Self Care | Payer: Medicare Other | Attending: Emergency Medicine | Admitting: Emergency Medicine

## 2023-05-09 DIAGNOSIS — I129 Hypertensive chronic kidney disease with stage 1 through stage 4 chronic kidney disease, or unspecified chronic kidney disease: Secondary | ICD-10-CM | POA: Diagnosis not present

## 2023-05-09 DIAGNOSIS — N189 Chronic kidney disease, unspecified: Secondary | ICD-10-CM | POA: Diagnosis not present

## 2023-05-09 DIAGNOSIS — M549 Dorsalgia, unspecified: Secondary | ICD-10-CM | POA: Insufficient documentation

## 2023-05-09 DIAGNOSIS — G8929 Other chronic pain: Secondary | ICD-10-CM | POA: Insufficient documentation

## 2023-05-09 DIAGNOSIS — M545 Low back pain, unspecified: Secondary | ICD-10-CM

## 2023-05-09 MED ORDER — OXYCODONE-ACETAMINOPHEN 5-325 MG PO TABS
1.0000 | ORAL_TABLET | Freq: Once | ORAL | Status: AC
  Administered 2023-05-09: 1 via ORAL
  Filled 2023-05-09: qty 1

## 2023-05-09 MED ORDER — OXYCODONE-ACETAMINOPHEN 5-325 MG PO TABS: 1.0000 | ORAL_TABLET | ORAL | 0 refills | Status: AC | PRN

## 2023-05-09 NOTE — Discharge Instructions (Addendum)
 As we discussed please call the number provided for physiatry to arrange a follow-up appointment.  Return to the emergency department for any weakness or numbness of any arm or leg or any loss of control of your bladder or your bowels.  Otherwise please follow-up with your doctor.  You may take your pain medication as needed for discomfort but only as written on the box.  Do not drink alcohol or drive while taking this medication.  Do not take within 4 hours of taking your Valium  medication that you have been previously prescribed by your doctor.

## 2023-05-09 NOTE — ED Provider Triage Note (Signed)
 Emergency Medicine Provider Triage Evaluation Note  Nathan Chambers , a 58 y.o. male  was evaluated in triage.  Pt complains of lower back pain due after fall yesterday. He reports frequent falls due to knee replacement complications. No other injury after fall yesterday. No LOC. SABRA  Physical Exam  There were no vitals taken for this visit. Gen:   Awake, no distress   Resp:  Normal effort  MSK:   Moves extremities without difficulty  Other:    Medical Decision Making  Medically screening exam initiated at 11:40 AM.  Appropriate orders placed.  HERLEY BERNARDINI was informed that the remainder of the evaluation will be completed by another provider, this initial triage assessment does not replace that evaluation, and the importance of remaining in the ED until their evaluation is complete.  Imaging ordered.   Herlinda Kirk NOVAK, FNP 05/09/23 1142

## 2023-05-09 NOTE — ED Provider Notes (Signed)
 [facility] Provider Note    Event Date/Time   First MD Initiated Contact with Patient 05/09/23 1319     (approximate)  History   Chief Complaint: Back Pain  HPI  Nathan Chambers is a 58 y.o. male with a past medical history of anxiety, arthritis, CKD, hypertension, hyperlipidemia, chronic back issues who presents to the emergency department for an exacerbation of back pain.  According to the patient he has had bilateral knee replacements, he states occasionally his left knee will give out causing him to fall to the ground.  He states this is fairly typical/chronic for him however 2 days ago when he fell he has been experiencing exacerbation of his chronic back pain ever since.  Patient states in the past he has required corticosteroid injections in his back he is also been seen by pain management previously with his back, but states he has not been on any chronic medications for quite some time.  States his back has been doing okay until a fall 2 days ago.  Denies any weakness or numbness in either leg, denies any bladder or bowel incontinence.  Physical Exam   Triage Vital Signs: ED Triage Vitals [05/09/23 1140]  Encounter Vitals Group     BP 124/80     Systolic BP Percentile      Diastolic BP Percentile      Pulse Rate 79     Resp 16     Temp 97.6 F (36.4 C)     Temp Source Oral     SpO2 98 %     Weight (!) 311 lb (141.1 kg)     Height 6' 3 (1.905 m)     Head Circumference      Peak Flow      Pain Score 7     Pain Loc      Pain Education      Exclude from Growth Chart     Most recent vital signs: Vitals:   05/09/23 1140  BP: 124/80  Pulse: 79  Resp: 16  Temp: 97.6 F (36.4 C)  SpO2: 98%    General: Awake, no distress.  CV:  Good peripheral perfusion.  Regular rate and rhythm  Resp:  Normal effort.  Equal breath sounds bilaterally.  Abd:  No distention.    RADIOLOGY  I have reviewed and interpreted the x-ray images.  No obvious  fracture or malalignment seen on my evaluation. Radiology is read the x-ray as mild degenerative changes but no acute fracture.   MEDICATIONS ORDERED IN ED: Medications - No data to display   IMPRESSION / MDM / ASSESSMENT AND PLAN / ED COURSE  I reviewed the triage vital signs and the nursing notes.  Patient's presentation is most consistent with acute illness / injury with system symptoms.  Patient presents to the emergency department for acute on chronic back pain.  Exacerbated after a fall several days ago.  Patient denies any weakness or numbness in the legs denies any bladder or bowel incontinence.  Patient has a history of back pain but states it has been exacerbated ever since the fall.  X-rays negative for fracture does show degenerative changes.  We will place the patient on a short course of pain medication have the patient follow-up with physiatry.  Discussed return precautions including but red flag return precautions.  Patient agreeable to plan.  FINAL CLINICAL IMPRESSION(S) / ED DIAGNOSES   Exacerbation of back pain  Rx / DC Orders  Percocet  Note:  This document was prepared using Dragon voice recognition software and may include unintentional dictation errors.   Dorothyann Drivers, MD 05/09/23 1334

## 2023-05-09 NOTE — ED Triage Notes (Signed)
 Pt reports chronic falls d/t knee replacement a few years ago that had caused him balance issues. Pt most recent fall caused low back pain.

## 2023-06-19 ENCOUNTER — Other Ambulatory Visit: Payer: Self-pay | Admitting: Family Medicine

## 2023-06-19 DIAGNOSIS — M5416 Radiculopathy, lumbar region: Secondary | ICD-10-CM

## 2023-06-23 ENCOUNTER — Ambulatory Visit
Admission: RE | Admit: 2023-06-23 | Discharge: 2023-06-23 | Disposition: A | Discharge status: Home / Self Care | Payer: Medicare Other | Source: Ambulatory Visit | Attending: Family Medicine | Admitting: Family Medicine

## 2023-06-23 DIAGNOSIS — M5416 Radiculopathy, lumbar region: Secondary | ICD-10-CM
